# Patient Record
Sex: Male | Born: 1940
Health system: Southern US, Community
[De-identification: ages and names within clinical notes are randomized; demographics above are authoritative.]

## PROBLEM LIST (undated history)

## (undated) DIAGNOSIS — E039 Hypothyroidism, unspecified: Secondary | ICD-10-CM

## (undated) DIAGNOSIS — K219 Gastro-esophageal reflux disease without esophagitis: Secondary | ICD-10-CM

## (undated) DIAGNOSIS — E7800 Pure hypercholesterolemia, unspecified: Secondary | ICD-10-CM

## (undated) DIAGNOSIS — G9340 Encephalopathy, unspecified: Secondary | ICD-10-CM

## (undated) DIAGNOSIS — S329XXA Fracture of unspecified parts of lumbosacral spine and pelvis, initial encounter for closed fracture: Secondary | ICD-10-CM

## (undated) DIAGNOSIS — M1990 Unspecified osteoarthritis, unspecified site: Secondary | ICD-10-CM

## (undated) DIAGNOSIS — I10 Essential (primary) hypertension: Secondary | ICD-10-CM

## (undated) DIAGNOSIS — E119 Type 2 diabetes mellitus without complications: Secondary | ICD-10-CM

## (undated) DIAGNOSIS — Z9289 Personal history of other medical treatment: Secondary | ICD-10-CM

## (undated) DIAGNOSIS — M8580 Other specified disorders of bone density and structure, unspecified site: Secondary | ICD-10-CM

## (undated) DIAGNOSIS — Z77098 Contact with and (suspected) exposure to other hazardous, chiefly nonmedicinal, chemicals: Secondary | ICD-10-CM

## (undated) HISTORY — PX: CARPAL TUNNEL RELEASE: SHX101

## (undated) HISTORY — DX: Other specified disorders of bone density and structure, unspecified site: M85.80

## (undated) HISTORY — PX: EYE SURGERY: SHX253

---

## 1986-10-31 HISTORY — PX: INCISION AND DRAINAGE OF WOUND: SHX1803

## 1998-02-18 ENCOUNTER — Encounter: Admission: RE | Admit: 1998-02-18 | Discharge: 1998-02-18 | Payer: Self-pay | Admitting: Hematology and Oncology

## 1998-03-27 ENCOUNTER — Encounter: Admission: RE | Admit: 1998-03-27 | Discharge: 1998-03-27 | Payer: Self-pay | Admitting: Internal Medicine

## 1998-06-02 ENCOUNTER — Encounter: Admission: RE | Admit: 1998-06-02 | Discharge: 1998-06-02 | Payer: Self-pay | Admitting: Internal Medicine

## 1998-07-03 ENCOUNTER — Encounter: Admission: RE | Admit: 1998-07-03 | Discharge: 1998-07-03 | Payer: Self-pay | Admitting: Internal Medicine

## 1998-08-17 ENCOUNTER — Encounter: Admission: RE | Admit: 1998-08-17 | Discharge: 1998-08-17 | Payer: Self-pay | Admitting: Hematology and Oncology

## 1998-09-18 ENCOUNTER — Encounter: Admission: RE | Admit: 1998-09-18 | Discharge: 1998-09-18 | Payer: Self-pay | Admitting: Internal Medicine

## 1998-12-16 ENCOUNTER — Encounter: Admission: RE | Admit: 1998-12-16 | Discharge: 1998-12-16 | Payer: Self-pay | Admitting: Hematology and Oncology

## 1999-03-15 ENCOUNTER — Encounter: Admission: RE | Admit: 1999-03-15 | Discharge: 1999-03-15 | Payer: Self-pay | Admitting: Internal Medicine

## 1999-05-22 ENCOUNTER — Inpatient Hospital Stay (HOSPITAL_COMMUNITY): Admission: EM | Admit: 1999-05-22 | Discharge: 1999-05-23 | Payer: Self-pay | Admitting: Emergency Medicine

## 1999-05-22 ENCOUNTER — Encounter: Payer: Self-pay | Admitting: Emergency Medicine

## 1999-05-23 ENCOUNTER — Encounter: Payer: Self-pay | Admitting: Internal Medicine

## 1999-06-16 ENCOUNTER — Encounter: Admission: RE | Admit: 1999-06-16 | Discharge: 1999-06-16 | Payer: Self-pay | Admitting: Internal Medicine

## 1999-07-26 ENCOUNTER — Encounter: Admission: RE | Admit: 1999-07-26 | Discharge: 1999-07-26 | Payer: Self-pay | Admitting: Hematology and Oncology

## 1999-09-07 ENCOUNTER — Encounter: Admission: RE | Admit: 1999-09-07 | Discharge: 1999-09-07 | Payer: Self-pay | Admitting: Hematology and Oncology

## 1999-10-13 ENCOUNTER — Encounter: Admission: RE | Admit: 1999-10-13 | Discharge: 1999-10-13 | Payer: Self-pay | Admitting: Internal Medicine

## 1999-11-01 ENCOUNTER — Encounter: Payer: Self-pay | Admitting: Internal Medicine

## 1999-11-01 ENCOUNTER — Emergency Department (HOSPITAL_COMMUNITY): Admission: EM | Admit: 1999-11-01 | Discharge: 1999-11-01 | Payer: Self-pay | Admitting: Internal Medicine

## 1999-12-01 ENCOUNTER — Encounter: Admission: RE | Admit: 1999-12-01 | Discharge: 1999-12-01 | Payer: Self-pay | Admitting: Internal Medicine

## 2000-04-05 ENCOUNTER — Encounter: Admission: RE | Admit: 2000-04-05 | Discharge: 2000-04-05 | Payer: Self-pay | Admitting: Internal Medicine

## 2000-04-24 ENCOUNTER — Encounter: Admission: RE | Admit: 2000-04-24 | Discharge: 2000-04-24 | Payer: Self-pay | Admitting: Internal Medicine

## 2000-08-25 ENCOUNTER — Encounter: Admission: RE | Admit: 2000-08-25 | Discharge: 2000-08-25 | Payer: Self-pay | Admitting: Hematology and Oncology

## 2000-09-13 ENCOUNTER — Encounter: Admission: RE | Admit: 2000-09-13 | Discharge: 2000-09-13 | Payer: Self-pay | Admitting: Internal Medicine

## 2000-11-06 ENCOUNTER — Encounter: Admission: RE | Admit: 2000-11-06 | Discharge: 2000-11-06 | Payer: Self-pay | Admitting: Internal Medicine

## 2001-02-26 ENCOUNTER — Ambulatory Visit (HOSPITAL_COMMUNITY): Admission: RE | Admit: 2001-02-26 | Discharge: 2001-02-26 | Payer: Self-pay | Admitting: Gastroenterology

## 2001-05-24 ENCOUNTER — Encounter: Admission: RE | Admit: 2001-05-24 | Discharge: 2001-05-24 | Payer: Self-pay | Admitting: Internal Medicine

## 2001-08-30 ENCOUNTER — Encounter: Admission: RE | Admit: 2001-08-30 | Discharge: 2001-08-30 | Payer: Self-pay

## 2001-12-11 ENCOUNTER — Encounter: Admission: RE | Admit: 2001-12-11 | Discharge: 2001-12-11 | Payer: Self-pay | Admitting: Internal Medicine

## 2002-01-02 ENCOUNTER — Encounter: Admission: RE | Admit: 2002-01-02 | Discharge: 2002-01-02 | Payer: Self-pay | Admitting: Internal Medicine

## 2002-04-02 ENCOUNTER — Encounter: Admission: RE | Admit: 2002-04-02 | Discharge: 2002-04-02 | Payer: Self-pay | Admitting: Internal Medicine

## 2002-04-03 ENCOUNTER — Encounter: Admission: RE | Admit: 2002-04-03 | Discharge: 2002-04-03 | Payer: Self-pay | Admitting: Internal Medicine

## 2002-09-02 ENCOUNTER — Encounter: Admission: RE | Admit: 2002-09-02 | Discharge: 2002-09-02 | Payer: Self-pay | Admitting: Internal Medicine

## 2002-12-23 ENCOUNTER — Encounter: Admission: RE | Admit: 2002-12-23 | Discharge: 2002-12-23 | Payer: Self-pay | Admitting: Internal Medicine

## 2002-12-24 ENCOUNTER — Ambulatory Visit (HOSPITAL_COMMUNITY): Admission: RE | Admit: 2002-12-24 | Discharge: 2002-12-24 | Payer: Self-pay | Admitting: *Deleted

## 2002-12-24 ENCOUNTER — Encounter: Payer: Self-pay | Admitting: *Deleted

## 2002-12-30 ENCOUNTER — Encounter: Admission: RE | Admit: 2002-12-30 | Discharge: 2002-12-30 | Payer: Self-pay | Admitting: Internal Medicine

## 2002-12-31 ENCOUNTER — Encounter: Admission: RE | Admit: 2002-12-31 | Discharge: 2002-12-31 | Payer: Self-pay | Admitting: Internal Medicine

## 2003-03-25 ENCOUNTER — Encounter: Admission: RE | Admit: 2003-03-25 | Discharge: 2003-03-25 | Payer: Self-pay | Admitting: Internal Medicine

## 2003-04-15 ENCOUNTER — Encounter: Admission: RE | Admit: 2003-04-15 | Discharge: 2003-04-15 | Payer: Self-pay | Admitting: Internal Medicine

## 2003-07-23 ENCOUNTER — Encounter: Admission: RE | Admit: 2003-07-23 | Discharge: 2003-07-23 | Payer: Self-pay | Admitting: Internal Medicine

## 2003-10-16 ENCOUNTER — Encounter: Admission: RE | Admit: 2003-10-16 | Discharge: 2003-10-16 | Payer: Self-pay | Admitting: Internal Medicine

## 2004-01-14 ENCOUNTER — Encounter: Admission: RE | Admit: 2004-01-14 | Discharge: 2004-01-14 | Payer: Self-pay | Admitting: Internal Medicine

## 2004-03-31 ENCOUNTER — Encounter: Admission: RE | Admit: 2004-03-31 | Discharge: 2004-03-31 | Payer: Self-pay | Admitting: Internal Medicine

## 2004-04-07 ENCOUNTER — Encounter: Admission: RE | Admit: 2004-04-07 | Discharge: 2004-04-07 | Payer: Self-pay | Admitting: Internal Medicine

## 2004-06-07 ENCOUNTER — Encounter: Admission: RE | Admit: 2004-06-07 | Discharge: 2004-06-07 | Payer: Self-pay | Admitting: Internal Medicine

## 2004-07-13 ENCOUNTER — Ambulatory Visit: Payer: Self-pay | Admitting: Internal Medicine

## 2004-08-10 ENCOUNTER — Ambulatory Visit: Payer: Self-pay | Admitting: Internal Medicine

## 2004-09-27 ENCOUNTER — Ambulatory Visit: Payer: Self-pay | Admitting: Internal Medicine

## 2004-10-01 ENCOUNTER — Ambulatory Visit: Payer: Self-pay | Admitting: Internal Medicine

## 2004-11-09 ENCOUNTER — Ambulatory Visit: Payer: Self-pay | Admitting: Internal Medicine

## 2005-02-15 ENCOUNTER — Emergency Department (HOSPITAL_COMMUNITY): Admission: EM | Admit: 2005-02-15 | Discharge: 2005-02-15 | Payer: Self-pay | Admitting: Emergency Medicine

## 2005-02-22 ENCOUNTER — Ambulatory Visit: Payer: Self-pay | Admitting: Internal Medicine

## 2005-04-11 ENCOUNTER — Encounter (INDEPENDENT_AMBULATORY_CARE_PROVIDER_SITE_OTHER): Payer: Self-pay | Admitting: Internal Medicine

## 2005-04-11 ENCOUNTER — Ambulatory Visit: Payer: Self-pay | Admitting: Internal Medicine

## 2005-04-11 LAB — CONVERTED CEMR LAB: PSA: 2.32 ng/mL

## 2005-06-15 ENCOUNTER — Ambulatory Visit: Payer: Self-pay | Admitting: Internal Medicine

## 2005-06-17 ENCOUNTER — Ambulatory Visit (HOSPITAL_COMMUNITY): Admission: RE | Admit: 2005-06-17 | Discharge: 2005-06-17 | Payer: Self-pay | Admitting: Internal Medicine

## 2005-07-01 ENCOUNTER — Ambulatory Visit: Payer: Self-pay | Admitting: Internal Medicine

## 2005-07-20 ENCOUNTER — Ambulatory Visit: Payer: Self-pay | Admitting: Internal Medicine

## 2005-08-19 ENCOUNTER — Ambulatory Visit: Payer: Self-pay | Admitting: Internal Medicine

## 2005-09-28 ENCOUNTER — Ambulatory Visit: Payer: Self-pay | Admitting: Internal Medicine

## 2005-10-07 ENCOUNTER — Ambulatory Visit: Payer: Self-pay | Admitting: Internal Medicine

## 2005-11-15 ENCOUNTER — Ambulatory Visit: Payer: Self-pay | Admitting: Internal Medicine

## 2006-03-31 ENCOUNTER — Ambulatory Visit: Payer: Self-pay | Admitting: Internal Medicine

## 2006-07-20 ENCOUNTER — Ambulatory Visit: Payer: Self-pay | Admitting: Hospitalist

## 2006-07-20 ENCOUNTER — Encounter (INDEPENDENT_AMBULATORY_CARE_PROVIDER_SITE_OTHER): Payer: Self-pay | Admitting: Internal Medicine

## 2006-07-20 LAB — CONVERTED CEMR LAB: Hgb A1c MFr Bld: 6.2 %

## 2006-08-02 ENCOUNTER — Ambulatory Visit: Payer: Self-pay | Admitting: Internal Medicine

## 2006-08-17 ENCOUNTER — Encounter (INDEPENDENT_AMBULATORY_CARE_PROVIDER_SITE_OTHER): Payer: Self-pay | Admitting: Internal Medicine

## 2006-08-17 DIAGNOSIS — E785 Hyperlipidemia, unspecified: Secondary | ICD-10-CM | POA: Insufficient documentation

## 2006-08-17 DIAGNOSIS — E119 Type 2 diabetes mellitus without complications: Secondary | ICD-10-CM | POA: Insufficient documentation

## 2006-08-17 DIAGNOSIS — Z860100 Personal history of colon polyps, unspecified: Secondary | ICD-10-CM | POA: Insufficient documentation

## 2006-08-17 DIAGNOSIS — L02419 Cutaneous abscess of limb, unspecified: Secondary | ICD-10-CM | POA: Insufficient documentation

## 2006-08-17 DIAGNOSIS — I10 Essential (primary) hypertension: Secondary | ICD-10-CM | POA: Insufficient documentation

## 2006-08-17 DIAGNOSIS — E1169 Type 2 diabetes mellitus with other specified complication: Secondary | ICD-10-CM | POA: Insufficient documentation

## 2006-08-17 DIAGNOSIS — B351 Tinea unguium: Secondary | ICD-10-CM | POA: Insufficient documentation

## 2006-08-17 DIAGNOSIS — L03119 Cellulitis of unspecified part of limb: Secondary | ICD-10-CM

## 2006-08-17 DIAGNOSIS — K5730 Diverticulosis of large intestine without perforation or abscess without bleeding: Secondary | ICD-10-CM | POA: Insufficient documentation

## 2006-08-17 DIAGNOSIS — S82899A Other fracture of unspecified lower leg, initial encounter for closed fracture: Secondary | ICD-10-CM | POA: Insufficient documentation

## 2006-08-17 DIAGNOSIS — H269 Unspecified cataract: Secondary | ICD-10-CM | POA: Insufficient documentation

## 2006-08-17 DIAGNOSIS — I152 Hypertension secondary to endocrine disorders: Secondary | ICD-10-CM | POA: Insufficient documentation

## 2006-08-17 DIAGNOSIS — M1990 Unspecified osteoarthritis, unspecified site: Secondary | ICD-10-CM | POA: Insufficient documentation

## 2006-10-10 DIAGNOSIS — M2682 Posterior soft tissue impingement: Secondary | ICD-10-CM | POA: Insufficient documentation

## 2006-10-10 DIAGNOSIS — Z8739 Personal history of other diseases of the musculoskeletal system and connective tissue: Secondary | ICD-10-CM | POA: Insufficient documentation

## 2006-12-25 ENCOUNTER — Ambulatory Visit: Payer: Self-pay | Admitting: Internal Medicine

## 2006-12-25 ENCOUNTER — Encounter (INDEPENDENT_AMBULATORY_CARE_PROVIDER_SITE_OTHER): Payer: Self-pay | Admitting: Internal Medicine

## 2006-12-25 ENCOUNTER — Telehealth: Payer: Self-pay | Admitting: *Deleted

## 2006-12-25 LAB — CONVERTED CEMR LAB
Creatinine, Urine: 108.4 mg/dL
Hgb A1c MFr Bld: 7 %

## 2007-03-23 ENCOUNTER — Ambulatory Visit: Payer: Self-pay | Admitting: Hospitalist

## 2007-03-23 ENCOUNTER — Encounter (INDEPENDENT_AMBULATORY_CARE_PROVIDER_SITE_OTHER): Payer: Self-pay | Admitting: Internal Medicine

## 2007-03-23 LAB — CONVERTED CEMR LAB
Blood Glucose, Fingerstick: 397
Hgb A1c MFr Bld: 6.6 %

## 2007-03-27 ENCOUNTER — Ambulatory Visit (HOSPITAL_COMMUNITY): Admission: RE | Admit: 2007-03-27 | Discharge: 2007-03-27 | Payer: Self-pay | Admitting: Internal Medicine

## 2007-03-30 ENCOUNTER — Ambulatory Visit: Payer: Self-pay | Admitting: Internal Medicine

## 2007-03-30 LAB — CONVERTED CEMR LAB: Insulin/Carbohydrate Ratio: 1

## 2007-04-02 ENCOUNTER — Encounter (INDEPENDENT_AMBULATORY_CARE_PROVIDER_SITE_OTHER): Payer: Self-pay | Admitting: *Deleted

## 2007-04-10 ENCOUNTER — Telehealth: Payer: Self-pay | Admitting: *Deleted

## 2007-05-15 ENCOUNTER — Telehealth (INDEPENDENT_AMBULATORY_CARE_PROVIDER_SITE_OTHER): Payer: Self-pay | Admitting: *Deleted

## 2007-05-30 ENCOUNTER — Telehealth (INDEPENDENT_AMBULATORY_CARE_PROVIDER_SITE_OTHER): Payer: Self-pay | Admitting: *Deleted

## 2007-12-03 ENCOUNTER — Other Ambulatory Visit: Payer: Self-pay

## 2007-12-03 ENCOUNTER — Ambulatory Visit: Payer: Self-pay

## 2008-06-03 ENCOUNTER — Inpatient Hospital Stay: Payer: Self-pay | Admitting: Internal Medicine

## 2008-07-31 ENCOUNTER — Ambulatory Visit (HOSPITAL_COMMUNITY): Admission: RE | Admit: 2008-07-31 | Discharge: 2008-07-31 | Payer: Self-pay | Admitting: Internal Medicine

## 2008-12-21 ENCOUNTER — Ambulatory Visit: Payer: Self-pay | Admitting: Internal Medicine

## 2008-12-21 ENCOUNTER — Inpatient Hospital Stay (HOSPITAL_COMMUNITY): Admission: EM | Admit: 2008-12-21 | Discharge: 2008-12-25 | Payer: Self-pay | Admitting: Emergency Medicine

## 2010-01-17 ENCOUNTER — Emergency Department (HOSPITAL_COMMUNITY): Admission: EM | Admit: 2010-01-17 | Discharge: 2010-01-17 | Payer: Self-pay | Admitting: Emergency Medicine

## 2011-01-23 LAB — DIFFERENTIAL
Eosinophils Relative: 4 % (ref 0–5)
Monocytes Absolute: 1 10*3/uL (ref 0.1–1.0)
Neutro Abs: 5.1 10*3/uL (ref 1.7–7.7)
Neutrophils Relative %: 54 % (ref 43–77)

## 2011-01-23 LAB — GLUCOSE, CAPILLARY
Glucose-Capillary: 207 mg/dL — ABNORMAL HIGH (ref 70–99)
Glucose-Capillary: 256 mg/dL — ABNORMAL HIGH (ref 70–99)

## 2011-01-23 LAB — POCT I-STAT, CHEM 8
Chloride: 106 mEq/L (ref 96–112)
HCT: 39 % (ref 39.0–52.0)
Potassium: 4.3 mEq/L (ref 3.5–5.1)

## 2011-01-23 LAB — CBC
HCT: 39.7 % (ref 39.0–52.0)
MCHC: 33.9 g/dL (ref 30.0–36.0)
WBC: 9.4 10*3/uL (ref 4.0–10.5)

## 2011-02-15 LAB — CBC
HCT: 35.2 % — ABNORMAL LOW (ref 39.0–52.0)
HCT: 39 % (ref 39.0–52.0)
HCT: 39 % (ref 39.0–52.0)
HCT: 40.5 % (ref 39.0–52.0)
Hemoglobin: 12.4 g/dL — ABNORMAL LOW (ref 13.0–17.0)
Hemoglobin: 13.5 g/dL (ref 13.0–17.0)
Hemoglobin: 13.5 g/dL (ref 13.0–17.0)
Hemoglobin: 13.7 g/dL (ref 13.0–17.0)
MCHC: 34.5 g/dL (ref 30.0–36.0)
MCHC: 34.6 g/dL (ref 30.0–36.0)
MCHC: 34.6 g/dL (ref 30.0–36.0)
MCHC: 34.7 g/dL (ref 30.0–36.0)
MCV: 87.6 fL (ref 78.0–100.0)
MCV: 88.4 fL (ref 78.0–100.0)
MCV: 89.2 fL (ref 78.0–100.0)
RBC: 4.45 MIL/uL (ref 4.22–5.81)
RBC: 4.48 MIL/uL (ref 4.22–5.81)
RDW: 13.9 % (ref 11.5–15.5)
RDW: 14.2 % (ref 11.5–15.5)
WBC: 7.4 10*3/uL (ref 4.0–10.5)
WBC: 8.3 10*3/uL (ref 4.0–10.5)

## 2011-02-15 LAB — BASIC METABOLIC PANEL
BUN: 40 mg/dL — ABNORMAL HIGH (ref 6–23)
CO2: 25 mEq/L (ref 19–32)
CO2: 27 mEq/L (ref 19–32)
Calcium: 8.6 mg/dL (ref 8.4–10.5)
Calcium: 9 mg/dL (ref 8.4–10.5)
Calcium: 9.2 mg/dL (ref 8.4–10.5)
Chloride: 103 mEq/L (ref 96–112)
Chloride: 103 mEq/L (ref 96–112)
Chloride: 109 mEq/L (ref 96–112)
Chloride: 99 mEq/L (ref 96–112)
Creatinine, Ser: 0.96 mg/dL (ref 0.4–1.5)
Creatinine, Ser: 1.02 mg/dL (ref 0.4–1.5)
Creatinine, Ser: 1.71 mg/dL — ABNORMAL HIGH (ref 0.4–1.5)
GFR calc Af Amer: 48 mL/min — ABNORMAL LOW (ref >60)
GFR calc Af Amer: >60 mL/min (ref >60)
GFR calc Af Amer: >60 mL/min (ref >60)
GFR calc non Af Amer: 40 mL/min — ABNORMAL LOW (ref >60)
GFR calc non Af Amer: >60 mL/min (ref >60)
GFR calc non Af Amer: >60 mL/min (ref >60)
Glucose, Bld: 153 mg/dL — ABNORMAL HIGH (ref 70–99)
Glucose, Bld: 175 mg/dL — ABNORMAL HIGH (ref 70–99)
Glucose, Bld: 356 mg/dL — ABNORMAL HIGH (ref 70–99)
Potassium: 3.8 mEq/L (ref 3.5–5.1)
Potassium: 4 mEq/L (ref 3.5–5.1)
Potassium: 4.4 mEq/L (ref 3.5–5.1)
Sodium: 135 mEq/L (ref 135–145)
Sodium: 141 mEq/L (ref 135–145)
Sodium: 142 mEq/L (ref 135–145)

## 2011-02-15 LAB — GLUCOSE, CAPILLARY
Glucose-Capillary: 168 mg/dL — ABNORMAL HIGH (ref 70–99)
Glucose-Capillary: 181 mg/dL — ABNORMAL HIGH (ref 70–99)
Glucose-Capillary: 240 mg/dL — ABNORMAL HIGH (ref 70–99)
Glucose-Capillary: 302 mg/dL — ABNORMAL HIGH (ref 70–99)
Glucose-Capillary: 309 mg/dL — ABNORMAL HIGH (ref 70–99)
Glucose-Capillary: 343 mg/dL — ABNORMAL HIGH (ref 70–99)
Glucose-Capillary: 347 mg/dL — ABNORMAL HIGH (ref 70–99)
Glucose-Capillary: 406 mg/dL — ABNORMAL HIGH (ref 70–99)
Glucose-Capillary: 421 mg/dL — ABNORMAL HIGH (ref 70–99)
Glucose-Capillary: 509 mg/dL (ref 70–99)
Glucose-Capillary: 64 mg/dL — ABNORMAL LOW (ref 70–99)

## 2011-02-15 LAB — COMPREHENSIVE METABOLIC PANEL
ALT: 29 U/L (ref 0–53)
Alkaline Phosphatase: 100 U/L (ref 39–117)
BUN: 16 mg/dL (ref 6–23)
CO2: 27 mEq/L (ref 19–32)
Calcium: 9.9 mg/dL (ref 8.4–10.5)
GFR calc Af Amer: >60 mL/min (ref >60)
Glucose, Bld: 54 mg/dL — ABNORMAL LOW (ref 70–99)
Potassium: 3.5 mEq/L (ref 3.5–5.1)
Sodium: 143 mEq/L (ref 135–145)
Total Bilirubin: 0.6 mg/dL (ref 0.3–1.2)
Total Protein: 6.1 g/dL (ref 6.0–8.3)

## 2011-02-15 LAB — HEMOGLOBIN A1C
Hgb A1c MFr Bld: 6.5 % — ABNORMAL HIGH (ref 4.6–6.1)
Mean Plasma Glucose: 140 mg/dL

## 2011-02-15 LAB — URINALYSIS, ROUTINE W REFLEX MICROSCOPIC
Glucose, UA: 500 mg/dL — AB
Hgb urine dipstick: NEGATIVE
Nitrite: NEGATIVE
Protein, ur: NEGATIVE mg/dL
Specific Gravity, Urine: 1.025 (ref 1.005–1.030)
Urobilinogen, UA: 0.2 mg/dL (ref 0.0–1.0)

## 2011-02-15 LAB — DIFFERENTIAL
Basophils Relative: 0 % (ref 0–1)
Eosinophils Relative: 0 % (ref 0–5)
Lymphocytes Relative: 37 % (ref 12–46)
Monocytes Absolute: 0.9 10*3/uL (ref 0.1–1.0)
Monocytes Relative: 11 % (ref 3–12)
Neutrophils Relative %: 52 % (ref 43–77)

## 2011-02-15 LAB — GLUCOSE, RANDOM: Glucose, Bld: 452 mg/dL — ABNORMAL HIGH (ref 70–99)

## 2011-02-15 LAB — RAPID URINE DRUG SCREEN, HOSP PERFORMED
Amphetamines: NOT DETECTED
Cocaine: NOT DETECTED
Opiates: NOT DETECTED

## 2011-02-15 LAB — T4, FREE: Free T4: 2.84 ng/dL — ABNORMAL HIGH (ref 0.89–1.80)

## 2011-02-15 LAB — TSH: TSH: 0.016 u[IU]/mL — ABNORMAL LOW (ref 0.350–4.500)

## 2011-02-15 LAB — T3, FREE: T3, Free: 8.2 pg/mL — ABNORMAL HIGH (ref 2.3–4.2)

## 2011-03-15 NOTE — Procedures (Signed)
REFERRING PHYSICIAN:  Dr. Welton Flakes.   CLINICAL HISTORY:  A 70 year old patient being evaluated for an episode  of possible seizures.   MEDICATION LISTED:  Insulin, aspirin, OxyContin, Lipitor,  hydrochlorothiazide, and Celebrex.   This is a 17-channel routine EEG recorded with the patient awake and  drowsy using standard 10/20 electrode placement.   Background awake rhythm consists of 8-9 Hz alpha, which is of diminished  amplitude, synchronous, reactive to eye-opening and closure.  No  paroxysmal epileptiform activities, spikes or sharp waves are noted.  Definite sleep stages are not noted except mild drowsiness, which is  uneventful.  Hyperventilation and photic stimulation are both  unremarkable.  EKG tracing reveals regular sinus rhythm.  Length of the  recording is 21.6 minutes.  Technical component is average.   IMPRESSION:  This EEG performed during awake and drowsy states is within  normal limits.  No definite epileptiform features were noted.           ______________________________  Sunny Schlein. Pearlean Brownie, MD     AOZ:[id number]  D:  07/31/2008 17:52:10  T:  08/01/2008 00:33:31  Job #:  [id number]   cc:   Dr. Welton Flakes.

## 2011-03-15 NOTE — Consult Note (Signed)
NAMEORLANDIS, [name] NO.:  [id number]   MEDICAL RECORD NO.:  [id number]          PATIENT TYPE:  INP   LOCATION:  2025                         FACILITY:  [facility]   PHYSICIAN:  Antonietta Breach, M.D.  DATE OF BIRTH:  05/07/1941   DATE OF CONSULTATION:  12/22/2008  DATE OF DISCHARGE:  12/25/2008                                 CONSULTATION   REASON FOR CONSULTATION:  Psychosis, agitation.   REQUESTING PHYSICIAN:  Ulyess Mort, M.D.   HISTORY OF PRESENT ILLNESS:  [name] is a 70 year old male  admitted to the [facility] on December 21, 2008 due to  psychosis and hypoglycemia.   [name] did require involuntary commitment and was given Geodon in  the emergency room for severe agitation and combativeness.  Since that  time, he has continued to have waxing and waning orientation.  He has  severe short-term memory impairment.  He also has shown impaired  judgment in managing his insulin.   One of the precipitating factors in his acute mental status changes  appears to be his mismanagement of insulin.   PAST PSYCHIATRIC HISTORY:  In review of the past medical record, there  was a similar episode in August 2009.   [name] was functional until approximately 1 year ago when he began  to deteriorate in his functioning.   FAMILY PSYCHIATRIC HISTORY:  None known.   SOCIAL HISTORY:  [name] wife lives with him.  Occupation:  Retired.  He does not use any alcohol or illegal drugs.  His religion is  [place].   PAST MEDICAL HISTORY:  Insulin-dependent diabetes mellitus with episodes  of hypoglycemia.   MEDICATIONS:  The MAR is reviewed.  He is on Haldol 2 mg IV q.4 h p.r.n.   ALLERGIES:  has an allergy to PENICILLIN.   LABORATORY DATA:  WBC 7.3, hemoglobin 13.5, platelet count 188.  Sodium  143, BUN 16, creatinine 0.93, glucose 54, SGOT 27, SGPT 29.   Alcohol and urine drug screen negative.   Head CT without contrast was  unremarkable.   REVIEW OF SYSTEMS:  [name] is only able to provide part of this.  The staff, his wife, medical record and electronic medical record  provide information.   Constitutional, head, eyes, ears, nose and throat, mouth, neurologic:  Unremarkable.  Psychiatric:  [name] did have an episode of  depression in 80.  He was treated with Paxil at that time effectively  with no recurrence.   During his mental status abnormalities this past week, [name] has  become paranoid.  He also did have an episode of mild confusion this  morning which has cleared.  There has been no combativeness with his  wife.   The the time of the undersigned's visit, [name] still has memory  impairment.  However, he is cooperative and noncombative.   Cardiovascular, respiratory, gastrointestinal, genitourinary, skin,  musculoskeletal, hematologic lymphatic, endocrine, metabolic all  unremarkable.   EXAMINATION:  VITAL SIGNS:  Temperature 98.5, pulse 84, respiratory rate  22, blood pressure 107/54, O2 saturation on room air 94%.  GENERAL APPEARANCE:  [name] is an elderly male sitting up in his  hospital chair with no abnormal involuntary movements.   MENTAL STATUS EXAM:  [name] is alert.  His eye contact is intact.  His attention span is mildly decreased, concentration mildly decreased.  His affect is slightly anxious, mood slightly anxious.  He does have  intact orientation to all spheres.  Memory:  3/3 words immediate 0/3  words on recall.  However, 3/3 visual objects immediate and 2/3 visual  objects on 3-minute recall.   Fund of knowledge and intelligence are within normal limits.  Speech  involves normal rate and prosody without dysarthria.  Thought process is  logical, coherent, goal-directed.  No looseness of associations.  Thought content:  No thoughts of harming himself or others. No delusions  or hallucinations.  His insight is intact and that he knows he has  been  having mental status problems involving his glucose. His judgment is  intact.   ASSESSMENT:  AXIS I:  293.00 delirium not otherwise specified.  Rule out  293.84 anxiety disorder not otherwise specified.  293.83 mood disorder not otherwise specified, depressed, now stable.  He  may have had an episode of major depression in the past in the early  1990s.  However, there has been no recurrence.  AXIS II:  Deferred.  AXIS III:  See past medical history.  AXIS IV:  General medical.  AXIS V:  55.   [name] is now in a stable mental state.  However, by definition  delirium can wax and wane.   The undersigned recommends that [name] reside in a 24-hour per day  supervision facility given his age and his apparent low delirium  threshold; however, he does decline and he does have the capacity for  informed consent:  He can make a consistent choice.  He can  differentiate between his options and their respective risks versus  benefits.  He can appreciate his risk of morbidity and mortality and he  can reason well.   RECOMMENDATIONS:  1. Would check an EKG for a QTC; if less than 450 milliseconds and his      psychosis and agitation return, would start Abilify 5 mg p.o. or IM      daily at 1800.  2. If compulsions over taking his CBGs continue, would send him to      outpatient psychiatric treatment.  3. Disposition Planning:  If his delirium remains resolved, he will be      cleared for outpatient discharge.  4. In the meantime, for any recurrence of severe agitation or      combativeness, would utilize Ativan one half to 2 mg p.o. IM or IV      q.4 h p.r.n. with caution about sedation, ataxia and falls.  5. Would keep memory and orientation cues in the room.      Antonietta Breach, M.D.  Electronically Signed     JW/MEDQ  D:  12/28/2008  T:  12/28/2008  Job:  [id number]   cc:   C. Ulyess Mort, M.D.

## 2011-03-18 NOTE — Procedures (Signed)
[facility]  Patient:    [name], [name]                      MRN: [id number] Proc. Date: 02/26/01 Adm. Date:  [id number] Attending:  Louie Bun CC:         [facility]   Procedure Report  PROCEDURE:  Colonoscopy.  INDICATIONS FOR PROCEDURE:  History of adenomatous colon polyps with last colonoscopy five years ago.  DESCRIPTION OF PROCEDURE:  The patient was placed in the left lateral decubitus position then placed on the pulse monitor with continuous low flow oxygen delivered by nasal cannula. He was sedated with 80 mg IV Demerol and 8 IV Versed. The Olympus video colonoscope was inserted into the rectum and advanced to the cecum, confirmed by transillumination of McBurneys point and visualization of the ileocecal valve and appendiceal orifice. The prep was suboptimal in the cecum and proximal ascending colon and I could not rule out small lesions less than 1 cm in all areas. Otherwise the cecum, ascending, transverse, descending, sigmoid and rectum all appeared normal with no masses, polyps, diverticula or other mucosal abnormalities. There were small internal hemorrhoids seen on retroflexed view. The colonoscope was then withdrawn and the patient returned to the recovery room in stable condition. The patient tolerated the procedure well and there were no immediate complications.  IMPRESSION:  Internal hemorrhoids otherwise normal colonoscopy.  PLAN:  Repeat colonoscopy in five years. DD:  02/26/01 TD:  02/26/01 Job: [id number]

## 2011-03-18 NOTE — Discharge Summary (Signed)
[name], [name]               ACCOUNT NO.:  [id number]   MEDICAL RECORD NO.:  [id number]          PATIENT TYPE:  INP   LOCATION:  2025                         FACILITY:  [facility]   PHYSICIAN:  C. Ulyess Mort, M.D.DATE OF BIRTH:  05-01-1941   DATE OF ADMISSION:  12/21/2008  DATE OF DISCHARGE:  12/25/2008                               DISCHARGE SUMMARY   DISCHARGE DIAGNOSES:  1. Graves disease, newly diagnosed.  2. Type 2 diabetes.  3. Altered mental status secondary to hypoglycemic episode, resolved.  4. Hypertension.  5. Hyperlipidemia.   DISCHARGE MEDICATIONS:  1. Lipitor 40 mg p.o. daily.  2. Hydrochlorothiazide 25 mg p.o. daily.  3. Aspirin 81 mg p.o. daily.  4. Celebrex 100 mg p.o. daily.  5. OxyContin 10 mg p.o. daily.  6. Benazepril 40 mg p.o. daily.  7. Vitamin B6 100 mg p.o. once daily.  8. NPH insulin inject 60 units each morning and 20 units each evening.  9. Humulin R inject 20 units in the morning with breakfast and 15      units in the evening.   DISPOSITION/FOLLOWUP:  The patient was being discharged from the [facility] on December 25, 2008.  The patient is to follow up  with his primary care physician, Dr. Beverely Risen in [place], [place], on December 29, 2008, at 9 o'clock in the morning.  At that time,  Dr. Welton Flakes is to set up treatment for the patient's newly diagnosed Graves  disease.  The patient's radioactive iodine uptake test results were  pending at the time of discharge.  The primary team contacted Dr. Welton Flakes  and informed her of the results of the newly diagnosed Graves disease.  She will need to initiate therapy at the time of followup.   PROCEDURE PERFORMED:  1. Head CT without contrast on December 21, 2008, shows no acute      intracranial abnormalities.  2. MRI of the brain without contrast on December 22, 2008, shows      cortical atrophy and mild chronic vascular ischemia.  No acute      abnormalities.  3. Thyroid  imaging and radioactive iodine uptake test on December 25, 2008, shows mildly elevated 24-hour radioactive uptake by the      thyroid gland, uptake is measured at 40%.  Findings are compatible      with diffuse toxic goiter and Graves disease.   CONSULTATIONS:  None.   BRIEF ADMITTING HISTORY AND PHYSICAL:  The patient is a 70 year old  Caucasian male with history of type 2 diabetes, hypertension, and  chronic progressive confusion who presents with altered mental status.  The patient has been becoming more demented over the past year per his  wife and over that time, he has started to become more combative and  fixated on keeping tight control of his diabetes but has had too-many-to-  count hypoglycemic episodes and for which his wife has had to give him  glucagon.  Per his wife he is fixated on keeping his CBGs below 120  being very aggressive with  the sliding scale insulins because before he  started having this decline in cognitive function.  The patient's wife  also states that he has become more combative than normal in recent  months.   PHYSICAL EXAMINATION:  VITAL SIGNS:  Temperature 98.5, blood pressure  147/81, pulse 97, respirations 18, and saturating 99% on room air.  GENERAL:  The patient is in no acute distress.  EYES:  Pupils are equal, round, and reactive to light and accommodation.  RESPIRATIONS:  Clear to auscultation bilaterally.  No wheezes  appreciated.  CARDIOVASCULAR:  Regular rate and rhythm.  No murmurs, rubs, or gallops.  GI:  Soft abdomen, nontender, and nondistended.  NEUROLOGIC:  Alert and oriented x2.  The patient is not oriented to  time.  The patient is having severe word finding difficulties and also  having very tangential thoughts.  His motor and sensory are grossly  intact.  The patient is very anxious and combative and screaming out at  hospital staff including both nurses and doctors and using foul  language.   LABORATORY DATA:  Sodium  143, potassium 3.5, chloride 108, bicarb 27,  BUN 16, creatinine 0.93, glucose 54.  White count 8.8, hemoglobin 14.1,  platelets 216.  Alcohol level less than 5.  Urine drug screen is  negative.  UA is negative for nitrites, negative for leukocytes.   HOSPITAL COURSE:  1. Altered mental status.  The patient was admitted to the [facility]      teaching service for evaluation of his altered mental status.  He      had to be admitted involuntarily for the first 24 hours because the      patient was adamant about not coming in.  However, both the      hospital staff including emergency medicine physician and the      internal medicine physician felt that the patient was endangering      to himself and need to be come in to evaluate for his newly      diagnosed altered mental status.  The patient's initial CT scan did      not show any evidence of trauma or head bleed.  His insulin regimen      was held on the first night of hospitalization and in that he had a      CBG of 54 and we were unsure of how much insulin he took prior to      coming into the hospital.  The patient also had an MRI the next day      as part of his  scan  workup to see if there had been any evidence      of new stroke and the MRI did not reveal any new evidence of      ischemia.  Also as part of his workup, the patient had a TSH level,      which was drawn which was found to be abnormal at a level of 0.016.      This will be discussed in further in problem #2.  Initially in the      emergency department, the patient received Geodon in order to calm      his combativeness.  The patient rested comfortably overnight and      had to receive two doses of Haldol throughout his first night of      admission.  The patient's mental status had cleared on second day      of hospitalization.  A mini-mental status exam was administered on      the second day of hospitalization.  The patient scored 29 out of      30.  He also stated  on second of hospitalization that he had felt      somewhat confused prior to coming to the hospital; however, his      wife related that he had returned to his normal baseline neurologic      function.  The patient's altered mental status was believed to be      due to secondary to his acute hypoglycemic episode.  The patient      was in stable and improved condition as regards his mental status      at the time of followup.  2. Newly diagnosed Graves disease.  As part of the patient's initial      workup for altered mental status, he received a TSH test, which      showed level of his thyroid-stimulating hormone to be 0.016.      Subsequently, a free T4 was drawn, which was elevated at 2.84 and a      free T3 was withdrawn, which was elevated at 8.2.  As part of this      workup, the patient had a radioactive iodine uptake test done on      December 25, 2008, which showed mildly elevated approximately 40%      24-hour radioactive iodine uptake by the thyroid gland, which is      compatible with diffuse toxic goiter, Graves disease.  The results      of this test were pending at the time of discharge; however, the      primary team was in contact with the patient's primary care      physician, Dr. Welton Flakes, about initiating therapy for his newly      diagnosed Graves disease at the time of followup.  3. Type 2 diabetes.  As mentioned, the patient was presented with a      hypoglycemic event.  In that, his primary team held his insulin      throughout his first night of hospitalization.  The patient was      started on a reduced dose of his home regimen of NPH.  His blood      sugars began to become elevated throughout his second and third      days of hospitalization.  The patient was subsequently put back on      his NPH home dose of 60 units in the morning, 20 units in the      evening.  He was maintained on a sliding scale regimen, moderate      intensive throughout his hospitalization.   He will resume his home      insulin regimen upon being discharged.  He has had good control of      hemoglobin A1c drawn in the hospital, level found to be 6.5%.  We      will defer to his primary care physician, Dr. Welton Flakes, on any      alterations that need to be made to avoid any future hypoglycemic      episodes.  4. Hypertension.  The patient was maintained on his home dose of      hydrochlorothiazide and ACE inhibitor.  His blood pressures were      well controlled throughout his hospitalization.   DISCHARGE VITAL SIGNS:  Temperature 97.8, blood pressure 113/59, pulse  84, respiration 20, and the patient was saturating 95% on room air.   DISCHARGE LABORATORY DATA:  Sodium 135, potassium 3.8, chloride 103,  bicarb 26, BUN 36, creatinine 1.17, glucose 175.  White blood cell count  8.3, hemoglobin 12.4, and platelets 172.      Genia Del, MD  Electronically Signed      C. Ulyess Mort, M.D.  Electronically Signed    ZF/MEDQ  D:  12/26/2008  T:  12/27/2008  Job:  [id number]   cc:   Beverely Risen, MD

## 2013-07-18 ENCOUNTER — Emergency Department: Payer: Self-pay | Admitting: Emergency Medicine

## 2013-07-28 ENCOUNTER — Emergency Department: Payer: Self-pay | Admitting: Emergency Medicine

## 2013-08-14 ENCOUNTER — Emergency Department (HOSPITAL_COMMUNITY): Payer: Medicare Other

## 2013-08-14 ENCOUNTER — Inpatient Hospital Stay (HOSPITAL_COMMUNITY)
Admission: EM | Admit: 2013-08-14 | Discharge: 2013-08-20 | DRG: 071 | Disposition: A | Discharge status: Home / Self Care | Payer: Medicare Other | Attending: Internal Medicine | Admitting: Internal Medicine

## 2013-08-14 ENCOUNTER — Encounter (HOSPITAL_COMMUNITY): Payer: Self-pay | Admitting: Emergency Medicine

## 2013-08-14 DIAGNOSIS — R4701 Aphasia: Secondary | ICD-10-CM | POA: Diagnosis present

## 2013-08-14 DIAGNOSIS — I517 Cardiomegaly: Secondary | ICD-10-CM | POA: Diagnosis present

## 2013-08-14 DIAGNOSIS — I152 Hypertension secondary to endocrine disorders: Secondary | ICD-10-CM | POA: Diagnosis present

## 2013-08-14 DIAGNOSIS — D72829 Elevated white blood cell count, unspecified: Secondary | ICD-10-CM | POA: Diagnosis present

## 2013-08-14 DIAGNOSIS — N179 Acute kidney failure, unspecified: Secondary | ICD-10-CM | POA: Diagnosis present

## 2013-08-14 DIAGNOSIS — E119 Type 2 diabetes mellitus without complications: Secondary | ICD-10-CM | POA: Diagnosis present

## 2013-08-14 DIAGNOSIS — G9340 Encephalopathy, unspecified: Principal | ICD-10-CM

## 2013-08-14 DIAGNOSIS — E0500 Thyrotoxicosis with diffuse goiter without thyrotoxic crisis or storm: Secondary | ICD-10-CM | POA: Diagnosis present

## 2013-08-14 DIAGNOSIS — E039 Hypothyroidism, unspecified: Secondary | ICD-10-CM | POA: Diagnosis present

## 2013-08-14 DIAGNOSIS — R7881 Bacteremia: Secondary | ICD-10-CM | POA: Diagnosis present

## 2013-08-14 DIAGNOSIS — R4182 Altered mental status, unspecified: Secondary | ICD-10-CM

## 2013-08-14 DIAGNOSIS — I10 Essential (primary) hypertension: Secondary | ICD-10-CM | POA: Diagnosis present

## 2013-08-14 DIAGNOSIS — E1169 Type 2 diabetes mellitus with other specified complication: Secondary | ICD-10-CM | POA: Diagnosis present

## 2013-08-14 DIAGNOSIS — E785 Hyperlipidemia, unspecified: Secondary | ICD-10-CM | POA: Diagnosis present

## 2013-08-14 HISTORY — DX: Unspecified osteoarthritis, unspecified site: M19.90

## 2013-08-14 HISTORY — DX: Encephalopathy, unspecified: G93.40

## 2013-08-14 HISTORY — DX: Essential (primary) hypertension: I10

## 2013-08-14 LAB — CBC WITH DIFFERENTIAL/PLATELET
Basophils Absolute: 0 10*3/uL (ref 0.0–0.1)
Eosinophils Relative: 0 % (ref 0–5)
Hemoglobin: 13.4 g/dL (ref 13.0–17.0)
Lymphocytes Relative: 34 % (ref 12–46)
Lymphs Abs: 4.7 10*3/uL — ABNORMAL HIGH (ref 0.7–4.0)
MCV: 87.2 fL (ref 78.0–100.0)
Monocytes Absolute: 1 10*3/uL (ref 0.1–1.0)
Monocytes Relative: 7 % (ref 3–12)
Neutro Abs: 8.3 10*3/uL — ABNORMAL HIGH (ref 1.7–7.7)
Neutrophils Relative %: 59 % (ref 43–77)
Platelets: 228 10*3/uL (ref 150–400)
RBC: 4.29 MIL/uL (ref 4.22–5.81)
RDW: 14.3 % (ref 11.5–15.5)
WBC: 14 10*3/uL — ABNORMAL HIGH (ref 4.0–10.5)

## 2013-08-14 LAB — URINALYSIS, ROUTINE W REFLEX MICROSCOPIC
Bilirubin Urine: NEGATIVE
Glucose, UA: >1000 mg/dL — AB
Hgb urine dipstick: NEGATIVE
Protein, ur: NEGATIVE mg/dL
Urobilinogen, UA: 1 mg/dL (ref 0.0–1.0)

## 2013-08-14 LAB — COMPREHENSIVE METABOLIC PANEL
ALT: 26 U/L (ref 0–53)
AST: 27 U/L (ref 0–37)
Alkaline Phosphatase: 90 U/L (ref 39–117)
CO2: 22 mEq/L (ref 19–32)
Calcium: 9.1 mg/dL (ref 8.4–10.5)
Chloride: 103 mEq/L (ref 96–112)
GFR calc Af Amer: >90 mL/min (ref >90)
GFR calc non Af Amer: 81 mL/min — ABNORMAL LOW (ref >90)
Glucose, Bld: 282 mg/dL — ABNORMAL HIGH (ref 70–99)
Potassium: 4.5 mEq/L (ref 3.5–5.1)
Sodium: 140 mEq/L (ref 135–145)
Total Bilirubin: 0.8 mg/dL (ref 0.3–1.2)
Total Protein: 6.4 g/dL (ref 6.0–8.3)

## 2013-08-14 LAB — HEMOGLOBIN A1C
Hgb A1c MFr Bld: 6.8 % — ABNORMAL HIGH (ref <5.7)
Mean Plasma Glucose: 148 mg/dL — ABNORMAL HIGH (ref <117)

## 2013-08-14 LAB — TSH: TSH: 0.696 u[IU]/mL (ref 0.350–4.500)

## 2013-08-14 LAB — URINE MICROSCOPIC-ADD ON

## 2013-08-14 LAB — RAPID URINE DRUG SCREEN, HOSP PERFORMED
Amphetamines: NOT DETECTED
Cocaine: NOT DETECTED
Opiates: NOT DETECTED

## 2013-08-14 LAB — ETHANOL: Alcohol, Ethyl (B): <11 mg/dL (ref 0–11)

## 2013-08-14 LAB — MRSA PCR SCREENING: MRSA by PCR: POSITIVE — AB

## 2013-08-14 LAB — AMMONIA: Ammonia: 23 umol/L (ref 11–60)

## 2013-08-14 LAB — GLUCOSE, CAPILLARY
Glucose-Capillary: 269 mg/dL — ABNORMAL HIGH (ref 70–99)
Glucose-Capillary: 257 mg/dL — ABNORMAL HIGH (ref 70–99)

## 2013-08-14 LAB — PROTIME-INR
INR: 1.01 (ref 0.00–1.49)
Prothrombin Time: 13.1 seconds (ref 11.6–15.2)

## 2013-08-14 LAB — CG4 I-STAT (LACTIC ACID): Lactic Acid, Venous: 0.77 mmol/L (ref 0.5–2.2)

## 2013-08-14 LAB — T4, FREE: Free T4: 1.21 ng/dL (ref 0.80–1.80)

## 2013-08-14 MED ORDER — LORAZEPAM 2 MG/ML IJ SOLN
0.5000 mg | Freq: Once | INTRAMUSCULAR | Status: AC
  Administered 2013-08-14: 0.5 mg via INTRAVENOUS

## 2013-08-14 MED ORDER — SELENIUM 50 MCG PO TABS
200.0000 ug | ORAL_TABLET | Freq: Every day | ORAL | Status: DC
  Filled 2013-08-14: qty 4

## 2013-08-14 MED ORDER — LORAZEPAM 2 MG/ML IJ SOLN
INTRAMUSCULAR | Status: AC
  Filled 2013-08-14: qty 1

## 2013-08-14 MED ORDER — OXYCODONE HCL ER 10 MG PO T12A: 10.0000 mg | EXTENDED_RELEASE_TABLET | Freq: Two times a day (BID) | ORAL | Status: DC

## 2013-08-14 MED ORDER — VANCOMYCIN HCL IN DEXTROSE 1-5 GM/200ML-% IV SOLN
1000.0000 mg | Freq: Once | INTRAVENOUS | Status: AC
  Administered 2013-08-14: 1000 mg via INTRAVENOUS
  Filled 2013-08-14: qty 200

## 2013-08-14 MED ORDER — SODIUM CHLORIDE 0.9 % IV BOLUS (SEPSIS)
1000.0000 mL | Freq: Once | INTRAVENOUS | Status: AC
  Administered 2013-08-14: 1000 mL via INTRAVENOUS

## 2013-08-14 MED ORDER — LORAZEPAM 2 MG/ML IJ SOLN
1.0000 mg | Freq: Once | INTRAMUSCULAR | Status: AC
  Administered 2013-08-14: 1 mg via INTRAVENOUS
  Filled 2013-08-14: qty 1

## 2013-08-14 MED ORDER — ASPIRIN EC 81 MG PO TBEC
81.0000 mg | DELAYED_RELEASE_TABLET | Freq: Every day | ORAL | Status: DC
  Filled 2013-08-14: qty 1

## 2013-08-14 MED ORDER — HEPARIN SODIUM (PORCINE) 5000 UNIT/ML IJ SOLN
5000.0000 [IU] | Freq: Three times a day (TID) | INTRAMUSCULAR | Status: DC
  Administered 2013-08-15 – 2013-08-20 (×17): 5000 [IU] via SUBCUTANEOUS
  Filled 2013-08-14 (×20): qty 1

## 2013-08-14 MED ORDER — ASPIRIN 300 MG RE SUPP
300.0000 mg | Freq: Every day | RECTAL | Status: DC
  Administered 2013-08-15 (×2): 300 mg via RECTAL
  Filled 2013-08-14 (×3): qty 1

## 2013-08-14 MED ORDER — ATORVASTATIN CALCIUM 40 MG PO TABS
40.0000 mg | ORAL_TABLET | Freq: Every day | ORAL | Status: DC
  Filled 2013-08-14: qty 1

## 2013-08-14 MED ORDER — SELENIUM 200 MCG PO TABS: 1.0000 | ORAL_TABLET | Freq: Every day | ORAL | Status: DC

## 2013-08-14 MED ORDER — MEROPENEM 1 G IV SOLR
1.0000 g | Freq: Once | INTRAVENOUS | Status: AC
  Administered 2013-08-14: 1 g via INTRAVENOUS
  Filled 2013-08-14: qty 1

## 2013-08-14 MED ORDER — ZIPRASIDONE MESYLATE 20 MG IM SOLR
10.0000 mg | Freq: Once | INTRAMUSCULAR | Status: AC
  Administered 2013-08-14: 10 mg via INTRAMUSCULAR
  Filled 2013-08-14: qty 20

## 2013-08-14 MED ORDER — SODIUM CHLORIDE 0.9 % IV BOLUS (SEPSIS)
500.0000 mL | Freq: Once | INTRAVENOUS | Status: AC
  Administered 2013-08-14: 500 mL via INTRAVENOUS

## 2013-08-14 MED ORDER — SODIUM CHLORIDE 0.9 % IV SOLN
INTRAVENOUS | Status: DC
  Administered 2013-08-14: 16:00:00 via INTRAVENOUS
  Administered 2013-08-15: 100 mL/h via INTRAVENOUS

## 2013-08-14 MED ORDER — HYDROCHLOROTHIAZIDE 25 MG PO TABS
25.0000 mg | ORAL_TABLET | Freq: Every day | ORAL | Status: DC
  Filled 2013-08-14: qty 1

## 2013-08-14 MED ORDER — BENAZEPRIL HCL 40 MG PO TABS
40.0000 mg | ORAL_TABLET | Freq: Every day | ORAL | Status: DC
  Filled 2013-08-14: qty 1

## 2013-08-14 MED ORDER — HYDROCODONE-ACETAMINOPHEN 7.5-325 MG PO TABS: 1.0000 | ORAL_TABLET | Freq: Four times a day (QID) | ORAL | Status: DC | PRN

## 2013-08-14 MED ORDER — DICLOFENAC SODIUM 50 MG PO TBEC
100.0000 mg | DELAYED_RELEASE_TABLET | Freq: Every day | ORAL | Status: DC
  Filled 2013-08-14: qty 2

## 2013-08-14 MED ORDER — VITAMIN B-6 100 MG PO TABS
100.0000 mg | ORAL_TABLET | Freq: Every day | ORAL | Status: DC
  Filled 2013-08-14: qty 1

## 2013-08-14 MED ORDER — THYROID 120 MG PO TABS
120.0000 mg | ORAL_TABLET | Freq: Every day | ORAL | Status: DC
Start: 2013-08-14 — End: 2013-08-14
  Filled 2013-08-14: qty 1

## 2013-08-14 MED ORDER — SODIUM CHLORIDE 0.9 % IJ SOLN
3.0000 mL | Freq: Two times a day (BID) | INTRAMUSCULAR | Status: DC
  Administered 2013-08-14 – 2013-08-20 (×5): 3 mL via INTRAVENOUS

## 2013-08-14 MED ORDER — INSULIN ASPART 100 UNIT/ML ~~LOC~~ SOLN
0.0000 [IU] | SUBCUTANEOUS | Status: DC
  Administered 2013-08-14: 5 [IU] via SUBCUTANEOUS
  Administered 2013-08-14: 3 [IU] via SUBCUTANEOUS
  Administered 2013-08-15: 1 [IU] via SUBCUTANEOUS
  Administered 2013-08-15: 2 [IU] via SUBCUTANEOUS
  Administered 2013-08-15: 1 [IU] via SUBCUTANEOUS
  Administered 2013-08-15: 2 [IU] via SUBCUTANEOUS
  Administered 2013-08-15: 3 [IU] via SUBCUTANEOUS
  Administered 2013-08-16: 2 [IU] via SUBCUTANEOUS
  Administered 2013-08-16: 5 [IU] via SUBCUTANEOUS
  Administered 2013-08-16: 7 [IU] via SUBCUTANEOUS
  Administered 2013-08-16 (×2): 2 [IU] via SUBCUTANEOUS
  Administered 2013-08-17: 7 [IU] via SUBCUTANEOUS
  Administered 2013-08-17: 9 [IU] via SUBCUTANEOUS

## 2013-08-14 MED ORDER — OMEGA-3-ACID ETHYL ESTERS 1 G PO CAPS
1.0000 g | ORAL_CAPSULE | Freq: Every day | ORAL | Status: DC
  Filled 2013-08-14: qty 1

## 2013-08-14 MED ORDER — INSULIN GLARGINE 100 UNIT/ML ~~LOC~~ SOLN
28.0000 [IU] | Freq: Every day | SUBCUTANEOUS | Status: DC
  Administered 2013-08-14 – 2013-08-18 (×5): 28 [IU] via SUBCUTANEOUS
  Filled 2013-08-14 (×7): qty 0.28

## 2013-08-14 NOTE — ED Notes (Signed)
Pt still will not lay flat for CT scan after giving Geodon 10mg . RN and NT positioned patient in the supine position, but patient continues to roll back to his right side. Patient does not follow commands. MD notified.

## 2013-08-14 NOTE — ED Provider Notes (Signed)
ECG interpretation   Date: 08/14/2013  Rate: 102  Rhythm: normal sinus rhythm  QRS Axis: normal  Intervals: normal  ST/T Wave abnormalities: normal  Conduction Disutrbances: none  Narrative Interpretation:   Old EKG Reviewed: no prior ecg   Lyanne Co, MD 08/14/13 [phone]

## 2013-08-14 NOTE — ED Notes (Signed)
Patient is resting comfortably, family at bedside

## 2013-08-14 NOTE — H&P (Signed)
Hospital Admission Note Date: 08/14/2013  Patient name: [name] Medical record number: [id number] Date of birth: 11-13-1940 Age: 72 y.o. Gender: male PCP: No primary provider on file.  Medical Service: IMTS  Attending physician:  Dr. Kem Kays     Internal Medicine Teaching Service Contact Information  Weekday Hours (7AM-5PM):   1st Contact: Dr. Yetta Barre Pager: [phone] 2nd Contact: Dr. Virgina Organ,  Pager; [phone]  ** If no return call within 15 minutes (after trying both pagers listed above), please call after hours pagers.   After 5 pm or weekends: 1st Contact: Pager: [phone] 2nd Contact: Pager: [phone]  Chief Complaint: AMS  History of Present Illness:  Patient is a 72 year old male with past medical history of DM-II, hypertension, hyperlipidemia, thyroid dysfunction, who presents with altered mental status.  History was obtained from patient's wife. Per his wife, patient has been doing well recently. He has been physically very active, doing a lot of yard works at home. Patient was last known normal at 2:00 p.m yesterday, when his wife called patient from work and had a normal conversation with patient. Patient was found to have altered mental status by his wife when she came home from work at about 10:00 PM. Per his wife, patient was sleeping in a recliner and was not responsive to any questions. He was found to have soiled himself with odor over his body. His wife thought that patient might have had low blood sugar. She tried to measure his blood sugar level without success since patient was agitated and did not allow her to do it. She gave him 3 glucose tablets. Patient followed his wife's command and took the glucose tablets without any choking. Then he also ate some food. After that, he was more responsive, but still could not answer questions appropriately. He answered "No" to all questions asked by his wife. Of note, patient was found to have sweating and agitation per his  wife, but no weakness in his extremities or facial droopy. Later on, patient went to the bathroom with the help of his wife. He was still confused and urinated on the floor. He had two minor falls. His wife called EMS and he was brought to the ED by EMS. His CBG was 245 at 3:30 AM in ED. Patient received one dose of Ziprasidone and ativan due to agitation. He was deeply sleeping when I saw patient in ED. His bp was 115/57 mmHg. His initial body temperature was 100.3 initially, the repeated measurement of the body temperature was 97.7. Patient received 1 dose of IV vancomycin and meropenem after blood culture was drawn in ED. By the time I completed interviewing his wife and my physical examination, patient woke up with confusion. He recognized his wife and said his own name right.  ROS:  No cough. It is difficult to assess whether he has chest pain, SOB, abdominal pain, diarrhea, constipation, dysuria, urgency, frequency, hematuria.    Meds: Current Outpatient Rx  Name  Route  Sig  Dispense  Refill  . aspirin EC 81 MG tablet   Oral   Take 81 mg by mouth daily.         Marland Kitchen atorvastatin (LIPITOR) 40 MG tablet   Oral   Take 40 mg by mouth daily.         . benazepril (LOTENSIN) 40 MG tablet   Oral   Take 40 mg by mouth daily.         . diclofenac (VOLTAREN) 50 MG EC tablet  Oral   Take 100 mg by mouth daily.         Marland Kitchen HUMULIN R 100 UNIT/ML injection   Subcutaneous   Inject 14 Units into the skin daily.          . hydrochlorothiazide (HYDRODIURIL) 25 MG tablet   Oral   Take 25 mg by mouth daily.         Marland Kitchen HYDROcodone-acetaminophen (NORCO) 7.5-325 MG per tablet      1 tablet every 6 (six) hours as needed for pain.          Marland Kitchen insulin glargine (LANTUS) 100 UNIT/ML injection   Subcutaneous   Inject 54 Units into the skin at bedtime.         . Omega-3 Fatty Acids (FISH OIL) 1200 MG CAPS   Oral   Take 1 capsule by mouth daily.         . OXYCONTIN 10 MG T12A 12 hr  tablet   Oral   Take 10 mg by mouth every 12 (twelve) hours.          . pyridOXINE (VITAMIN B-6) 100 MG tablet   Oral   Take 100 mg by mouth daily.         . Selenium 200 MCG TABS   Oral   Take 1 tablet by mouth daily.         Marland Kitchen SPIRULINA PO   Oral   Take 750 mg by mouth 3 (three) times daily.         Marland Kitchen thyroid (ARMOUR) 120 MG tablet   Oral   Take 120 mg by mouth daily before breakfast.           Allergies: Allergies as of 08/14/2013 - Review Complete 08/14/2013  Allergen Reaction Noted  . Penicillins Rash    Past Medical History  Diagnosis Date  . Arthritis   . Diabetes mellitus without complication   . Hypertension    History reviewed. No pertinent past surgical history. No family history on file. History   Social History  . Marital Status: Married    Spouse Name: N/A    Number of Children: N/A  . Years of Education: N/A   Occupational History  . Not on file.   Social History Main Topics  . Smoking status: Unknown If Ever Smoked  . Smokeless tobacco: Not on file  . Alcohol Use: No  . Drug Use: No  . Sexual Activity: Not on file   Other Topics Concern  . Not on file   Social History Narrative  . No narrative on file    Review of Systems: Full 14-point review of systems otherwise negative except as noted above in HPI.  Physical Exam:   Filed Vitals:   08/14/13 1045 08/14/13 1100 08/14/13 1115 08/14/13 1145  BP: 112/59 106/48 102/51 131/64  Pulse: 99 72 97 97  Temp:      TempSrc:      Resp:      SpO2: 96% 96% 95% 99%    General: patient is sleeping deeply, could not answers questions initially, but woke up by the end of my physical exam.  HEENT: PERRL, EOMI, no scleral icterus, No JVD or bruit Cardiac: S1/S2, RRR, No murmurs, gallops or rubs Pulm: Good air movement bilaterally. Clear to auscultation bilaterally. No rales, wheezing, rhonchi or rubs. Abd: Soft, nondistended, no organomegaly, BS present Ext: No edema. 2+DP/PT pulse  bilaterally. Small area of bruis over the left upper arm. Musculoskeletal: No joint deformities,  erythema Skin: No rashes.  Neuro: Partially oriented to person (recognized his wife). Not following commands. Actively resisting exam. Move all extremities. No facial droop. Neurologic exam is limited. Negative Babinski sign.  Lab results: Basic Metabolic Panel:  Recent Labs  82/95/62 0350  NA 140  K 4.5  CL 103  CO2 22  GLUCOSE 282*  BUN 18  CREATININE 0.95  CALCIUM 9.1   Liver Function Tests:  Recent Labs  08/14/13 0350  AST 27  ALT 26  ALKPHOS 90  BILITOT 0.8  PROT 6.4  ALBUMIN 3.6   No results found for this basename: LIPASE, AMYLASE,  in the last 72 hours  Recent Labs  08/14/13 0925  AMMONIA 23   CBC:  Recent Labs  08/14/13 0350  WBC 14.0*  NEUTROABS 8.3*  HGB 13.4  HCT 37.4*  MCV 87.2  PLT 228   Cardiac Enzymes:  Recent Labs  08/14/13 0350  TROPONINI <0.30   BNP: No results found for this basename: PROBNP,  in the last 72 hours D-Dimer: No results found for this basename: DDIMER,  in the last 72 hours CBG:  Recent Labs  08/14/13 0330 08/14/13 1122  GLUCAP 245* 269*   Hemoglobin A1C: No results found for this basename: HGBA1C,  in the last 72 hours Fasting Lipid Panel: No results found for this basename: CHOL, HDL, LDLCALC, TRIG, CHOLHDL, LDLDIRECT,  in the last 72 hours Thyroid Function Tests: No results found for this basename: TSH, T4TOTAL, FREET4, T3FREE, THYROIDAB,  in the last 72 hours Anemia Panel: No results found for this basename: VITAMINB12, FOLATE, FERRITIN, TIBC, IRON, RETICCTPCT,  in the last 72 hours Coagulation:  Recent Labs  08/14/13 0350  LABPROT 13.1  INR 1.01   Urine Drug Screen: Drugs of Abuse     Component Value Date/Time   LABOPIA NONE DETECTED 08/14/2013 0822   COCAINSCRNUR NONE DETECTED 08/14/2013 0822   LABBENZ NONE DETECTED 08/14/2013 0822   AMPHETMU NONE DETECTED 08/14/2013 0822   THCU NONE  DETECTED 08/14/2013 0822   LABBARB NONE DETECTED 08/14/2013 0822    Alcohol Level:  Recent Labs  08/14/13 0350  ETH <11   Urinalysis:  Recent Labs  08/14/13 0822  COLORURINE YELLOW  LABSPEC 1.022  PHURINE 6.0  GLUCOSEU >1000*  HGBUR NEGATIVE  BILIRUBINUR NEGATIVE  KETONESUR 40*  PROTEINUR NEGATIVE  UROBILINOGEN 1.0  NITRITE NEGATIVE  LEUKOCYTESUR NEGATIVE   Misc. Labs:  Imaging results:  Ct Head Wo Contrast  08/14/2013   CLINICAL DATA:  Altered mental status. Hypoglycemia.  EXAM: CT HEAD WITHOUT CONTRAST  TECHNIQUE: Contiguous axial images were obtained from the base of the skull through the vertex without intravenous contrast.  COMPARISON:  MRI brain 12/22/2008. Head CT 02/29/2010.  FINDINGS: Patient was scanned in the right lateral decubitus position due to condition. Reformatted images were obtained. There is no evidence of acute intracranial hemorrhage, mass lesion, brain edema or extra-axial fluid collection. Mild atrophy and mild chronic small vessel ischemic changes are stable. There is no evidence of cortical based infarct or hydrocephalus.  The visualized paranasal sinuses, mastoid air cells and middle ears are clear. The calvarium is intact.  IMPRESSION: No acute intracranial findings. Stable atrophy and chronic small vessel ischemic changes.   Electronically Signed   By: Roxy Horseman M.D.   On: 08/14/2013 09:23   Dg Chest Port 1 View  08/14/2013   CLINICAL DATA:  Altered mental status.  EXAM: PORTABLE CHEST - 1 VIEW  COMPARISON:  None.  FINDINGS: No  focal opacity or pulmonary edema. No effusion or pneumothorax. Cardiomegaly. Mediastinal contours distorted by rightward rotation. No acute osseous findings.  IMPRESSION: 1. No definitive acute cardiopulmonary disease. 2. Cardiomegaly. 3. When clinically able, repeat examination could evaluate the mediastinum - which is distorted by rotation.   Electronically Signed   By: Tiburcio Pea M.D.   On: 08/14/2013 05:44     Other results:  EKG: Sinus rhythm, regular, tachycardia with heart rates 102, marginally left axis deviation, normal axis, early R wave progression, normal QT interval, No ischemic change in T waves or ST segments.  Assessment & Plan by Problem: Principal Problem:   Altered mental status Active Problems:   DIABETES MELLITUS, TYPE II   HYPERLIPIDEMIA   HYPERTENSION  72 year old woman with a past medical history for diabetes, hypertension, thyroid dysfunction, who presents with altered mental status. CT head negative for acute abnormalities. Portable chest x-ray is no infiltration. Leukocytosis with WBC 14.0. Tm 100.3. Electrolytes normal with AG of 15 and HCO3 of 22. UDS negative. Urinalysis negative for UTI. Lactic acid 0.77. Blood alcohol level <11.   #: Acute encephalopathy: Etiology is not completely clear at this moment. Hypoglycemia is one of the potential differential diagnoses. Per his wife, patient had similar episodes in the past. He was hospitalized due to similar presentation 2010. Patient was found to have hypoglycemia with CBG of 54. He had a negative workup including CT head and MRI for stroke. In consistent with this diagnosis, patient was found to have had sweating, agitation and confusion by his wife. He responded partially to the treatment with glucose tablets and food intake. Other differential diagnoses include, but less likely, TIA/stroke (unlikely, no signs of weakness, facial droop, CT head negative); seizure (patient was not found to have seizure activity by his wife. He did not have seizure history. His past EEG was negative for seizure 2009); hepatic encephalopathy(no history of cirrhosis, liver function was normal on admission); drug overdose (UDS negative); Infection (though possible given leukocytosis of WBC 14 and fever with temperature 100.3, yet no obvious site of infection was detected. Pneumonia is unlikely given no cough and a negative chest x-ray. UTI is  unlikely given negative UA for UTI);  cardiac arrhythmia (less likely, patient does not have a history of heart tissue, no arrhythmia on EKG); pulmonary embolism (Well's score was low probability, patient seems to not have any chest pain after he woke up. His oxygen saturations is normal on room air); meningitis is also less likely (He does not have headache after woke up and his repeated body temperature is normal). Another possibility is severe hypothyroidism-coma, which can not be completely ruled out at this moment. Patient was diagnosed with Graves' disease 2010, but obviously converted to hypothyroidism since patient is currently on Armour at home. Patient received 1 dose of IV vancomycin and meropenem in ED after blood culture was drawn. Patient's mental status improved slightly now.  -will admit to step down unit for observation -monitor on Tele -Neuro checks every 2 hours -N.p.o. until swallow test done -Pending blood culture.  -will not continue antibiotics for now. Patient's repeated the temperature is normal. No infection site was identified. -stat TSH, Free T4 and T3. -trop X 1 -Fall precaution -IVF: NS 100 cc/h -will consider MRI or LP if not improve -Medical record was requested by calling his PCP's office (Dr. Tora Kindred [phone])  #: HTN: bp is 115/ 57 on admission. No signs of congestive heart failure. -Will continue home medications: Lotensin 40 mg  daily, hydrochlorothiazide 25 mg daily  #: HLD: Patient is on Lipitor 40 mg daily. Liver functions normal -will continue Lipitor 40 mg daily.  #: hx of Graves' disease: it was diagnosed 2010. Obviously converted to hypothyroidism since patient is currently on ARMOUR at home. -Will check stat TSH, free T4/T3 to rule out possibility of severe hypothyroidism coma. -Will continue home medications, Armour for now.  #:  DM-II: no A1c in Epic. Patient was on Coumadin and Lantus 54 units daily at home. -will start half home dose of  lantus now given NPO: 28 U daily. -check A1c  #: AG of 15:  It is likely due to starvation. Patient's bicarbonate is 22, does not look like to have DKA. Lactic acid is normal.  -ivf: ns 100 CC/H -F/U bmp  #  F/E/N  -NS: IV 100 cc/hr -f/u BMP -NPO until swallowing test done  # DVT px: Heparin sq   Dispo: Disposition is deferred at this time, awaiting improvement of current medical problems. Anticipated discharge in approximately 1 day(s).   The patient does have a current PCP (No primary provider on file.), therefore will be requiring OPC follow-up after discharge.   The patient does not have transportation limitations that hinder transportation to clinic appointments.  Signed:  Lorretta Harp, MD PGY3, Internal Medicine Teaching Service Pager: [phone]  08/14/2013, 11:50 AM

## 2013-08-14 NOTE — Progress Notes (Signed)
cCRITICAL VALUE ALERT  Critical value received:  MRSA + by PCR  Date of notification:  08/14/13  Time of notification:  15:30  Critical value read back:yes  Nurse who received alert:  J. Meredeth Ide  MD notified (1st page): Dr. Maurice March  Time of first page: 16:30    MD notified (2nd page):  Time of second page:  Responding MD:  Resident on call   Time MD responded: 16:40

## 2013-08-14 NOTE — ED Notes (Signed)
Pt from home.  Per EMS, pt LSN yesterday at 1200.  Pt's family member returned home and found pt in wheelchair nonverbal at 2330.  Pt's family member thought it was a glucose issue they were giving him glucose tablets.  EMS reports CBG 246.  Pt has urinated and defecated on himself.  Pt non-verbal, does not follow commands.  Pt appears to move all extremities as he fights against this RN during the assessment.

## 2013-08-14 NOTE — ED Notes (Signed)
Cannot get patient to lay flat. Patient rolls back to right side when placed flat. Patient becomes agitated when trying to position him flat. Patient does not follow commands.

## 2013-08-14 NOTE — ED Provider Notes (Signed)
Apiration of blood/fluid Performed by: Lyanne Co Consent obtained. Required items: required blood products, implants, devices, and special equipment available Patient identity confirmed: verbally with patient Time out: Immediately prior to procedure a "time out" was called to verify the correct patient, procedure, equipment, support staff and site/side marked as required. Preparation: Patient was prepped and draped in the usual sterile fashion. Patient tolerance: Patient tolerated the procedure well with no immediate complications. Location of aspiration: right antecubital fossa (blood obtained for sampling)     Lyanne Co, MD 08/14/13 [phone]

## 2013-08-14 NOTE — ED Notes (Signed)
Patient is resting comfortably, and family is at bedside.

## 2013-08-14 NOTE — ED Notes (Signed)
Unable to obtain head CT due to pt movement.  MD aware.  0.5mg  Ativan ordered.

## 2013-08-14 NOTE — ED Notes (Signed)
Family at bedside. 

## 2013-08-14 NOTE — ED Notes (Signed)
Patient confused pulling at his lines and monitor leads.  Had to reorient the patient.

## 2013-08-14 NOTE — ED Provider Notes (Signed)
CSN: [id number]     Arrival date & time 08/14/13  0221 History   First MD Initiated Contact with Patient 08/14/13 0300     Chief Complaint  Patient presents with  . Altered Mental Status   (Consider location/radiation/quality/duration/timing/severity/associated sxs/prior Treatment) HPI Patient brought in by EMS for altered mental status. Wife at bedside states that she last spoke to her husband at 2 PM yesterday and he was at his baseline. When she came home from work at 10:30 he was not speaking to her and seemed to be more confused. She thought that maybe his blood sugar was low and gave him some sugar tablets. He had defecated and urinated on himself. He was not following commands. Wife states the patient is normally verbal and can care for some of his activities of daily living. Patient is unable to contribute history. Level V caveat applies. Past Medical History  Diagnosis Date  . Arthritis   . Diabetes mellitus without complication   . Hypertension    History reviewed. No pertinent past surgical history. No family history on file. History  Substance Use Topics  . Smoking status: Unknown If Ever Smoked  . Smokeless tobacco: Not on file  . Alcohol Use: No    Review of Systems  Unable to perform ROS: Mental status change    Allergies  Penicillins  Home Medications   Current Outpatient Rx  Name  Route  Sig  Dispense  Refill  . aspirin EC 81 MG tablet   Oral   Take 81 mg by mouth daily.         Marland Kitchen atorvastatin (LIPITOR) 40 MG tablet   Oral   Take 40 mg by mouth daily.         . benazepril (LOTENSIN) 40 MG tablet   Oral   Take 40 mg by mouth daily.         . diclofenac (VOLTAREN) 50 MG EC tablet   Oral   Take 100 mg by mouth daily.         Marland Kitchen HUMULIN R 100 UNIT/ML injection   Subcutaneous   Inject 14 Units into the skin daily.          . hydrochlorothiazide (HYDRODIURIL) 25 MG tablet   Oral   Take 25 mg by mouth daily.         Marland Kitchen  HYDROcodone-acetaminophen (NORCO) 7.5-325 MG per tablet      1 tablet every 6 (six) hours as needed for pain.          Marland Kitchen insulin glargine (LANTUS) 100 UNIT/ML injection   Subcutaneous   Inject 54 Units into the skin at bedtime.         . Omega-3 Fatty Acids (FISH OIL) 1200 MG CAPS   Oral   Take 1 capsule by mouth daily.         . OXYCONTIN 10 MG T12A 12 hr tablet   Oral   Take 10 mg by mouth every 12 (twelve) hours.          . pyridOXINE (VITAMIN B-6) 100 MG tablet   Oral   Take 100 mg by mouth daily.         . Selenium 200 MCG TABS   Oral   Take 1 tablet by mouth daily.         Marland Kitchen SPIRULINA PO   Oral   Take 750 mg by mouth 3 (three) times daily.         Marland Kitchen thyroid (ARMOUR)  120 MG tablet   Oral   Take 120 mg by mouth daily before breakfast.          BP 115/53  Temp(Src) 99.6 F (37.6 C) (Axillary)  Resp 12  SpO2 95% Physical Exam  Nursing note and vitals reviewed. Constitutional: He appears well-developed and well-nourished. No distress.  Smells of feces  HENT:  Head: Normocephalic and atraumatic.  Unable to examine the oropharynx due to the patient  noncompliance   Eyes: EOM are normal. Pupils are equal, round, and reactive to light.  Neck: Normal range of motion. Neck supple.  Cardiovascular: Normal rate and regular rhythm.   Pulmonary/Chest: Effort normal and breath sounds normal. No respiratory distress. He has no wheezes. He has no rales. He exhibits no tenderness.  Abdominal: Soft. Bowel sounds are normal. He exhibits no distension and no mass. There is no tenderness. There is no rebound and no guarding.  Musculoskeletal: Normal range of motion. He exhibits no edema and no tenderness.  Neurological:  Awake. Not following commands. Actively resisting exam. Not speaking. Appears to move all extremities. Neurologic exam is limited.  Skin: Skin is warm and dry. No rash noted. No erythema.    ED Course  Procedures (including critical care  time) Labs Review Labs Reviewed  GLUCOSE, CAPILLARY - Abnormal; Notable for the following:    Glucose-Capillary 245 (*)    All other components within normal limits  CBC WITH DIFFERENTIAL  COMPREHENSIVE METABOLIC PANEL  TROPONIN I  URINALYSIS, ROUTINE W REFLEX MICROSCOPIC  PROTIME-INR  APTT  ETHANOL  URINE RAPID DRUG SCREEN (HOSP PERFORMED)   Imaging Review No results found.  EKG Interpretation   None       MDM  We'll work the patient up for altered mental status. Suspect infectious origin. Patient is signed out to oncoming EDP pending completion of workup and likely admission.  Loren Racer, MD 08/16/13 1536

## 2013-08-14 NOTE — ED Notes (Signed)
Pt's wife at bedside.  Pt not a good historian to pt's medical history.  Wife states she last saw pt at 1200 yesterday, but spoke to him on the phone at 1400.  She states pt seemed normal at 1400.  Pt states she returned home at 2230 last night and found pt in his recliner.  She states pt was not verbal when she came home so she assumed his glucose was low.  She gave him 3 glucose tabs and 4 PB crackers.  Pt remains non verbal and does not follow commands.  Pt now trying to get out of bed.  Seizure pads placed on bed so pt doesn't get arms/legs caught in rails.

## 2013-08-14 NOTE — ED Notes (Signed)
Lactic acid results shown to Dr. Ranae Palms

## 2013-08-14 NOTE — ED Provider Notes (Signed)
10:03 AM No clear source for delerium at this time. OPC to evaluate the patient and admit. Covered with abx.   Lyanne Co, MD 08/14/13 1003

## 2013-08-15 ENCOUNTER — Inpatient Hospital Stay (HOSPITAL_COMMUNITY): Payer: Medicare Other

## 2013-08-15 ENCOUNTER — Observation Stay (HOSPITAL_COMMUNITY): Payer: Medicare Other

## 2013-08-15 DIAGNOSIS — I10 Essential (primary) hypertension: Secondary | ICD-10-CM

## 2013-08-15 DIAGNOSIS — R4182 Altered mental status, unspecified: Secondary | ICD-10-CM

## 2013-08-15 DIAGNOSIS — G9340 Encephalopathy, unspecified: Principal | ICD-10-CM

## 2013-08-15 DIAGNOSIS — E119 Type 2 diabetes mellitus without complications: Secondary | ICD-10-CM

## 2013-08-15 LAB — GLUCOSE, CAPILLARY
Glucose-Capillary: 186 mg/dL — ABNORMAL HIGH (ref 70–99)
Glucose-Capillary: 204 mg/dL — ABNORMAL HIGH (ref 70–99)
Glucose-Capillary: 120 mg/dL — ABNORMAL HIGH (ref 70–99)
Glucose-Capillary: 138 mg/dL — ABNORMAL HIGH (ref 70–99)
Glucose-Capillary: 151 mg/dL — ABNORMAL HIGH (ref 70–99)
Glucose-Capillary: 217 mg/dL — ABNORMAL HIGH (ref 70–99)

## 2013-08-15 LAB — CBC
HCT: 33.4 % — ABNORMAL LOW (ref 39.0–52.0)
Hemoglobin: 11.6 g/dL — ABNORMAL LOW (ref 13.0–17.0)
MCHC: 34.7 g/dL (ref 30.0–36.0)
Platelets: 185 10*3/uL (ref 150–400)
RDW: 14.4 % (ref 11.5–15.5)
WBC: 10 10*3/uL (ref 4.0–10.5)

## 2013-08-15 LAB — BASIC METABOLIC PANEL
BUN: 18 mg/dL (ref 6–23)
Calcium: 8.3 mg/dL — ABNORMAL LOW (ref 8.4–10.5)
Chloride: 108 mEq/L (ref 96–112)
Creatinine, Ser: 0.98 mg/dL (ref 0.50–1.35)
GFR calc Af Amer: >90 mL/min (ref >90)
GFR calc non Af Amer: 80 mL/min — ABNORMAL LOW (ref >90)
Potassium: 3.6 mEq/L (ref 3.5–5.1)
Sodium: 141 mEq/L (ref 135–145)

## 2013-08-15 MED ORDER — VANCOMYCIN HCL 10 G IV SOLR
2000.0000 mg | Freq: Once | INTRAVENOUS | Status: AC
  Administered 2013-08-15: 2000 mg via INTRAVENOUS
  Filled 2013-08-15: qty 2000

## 2013-08-15 MED ORDER — MUPIROCIN 2 % EX OINT
1.0000 "application " | TOPICAL_OINTMENT | Freq: Two times a day (BID) | CUTANEOUS | Status: AC
  Administered 2013-08-15 – 2013-08-19 (×10): 1 via NASAL
  Filled 2013-08-15 (×2): qty 22

## 2013-08-15 MED ORDER — LORAZEPAM 2 MG/ML IJ SOLN
0.5000 mg | Freq: Once | INTRAMUSCULAR | Status: AC
  Administered 2013-08-15: 0.5 mg via INTRAVENOUS
  Filled 2013-08-15: qty 1

## 2013-08-15 MED ORDER — DEXTROSE 5 % IV SOLN
800.0000 mg | Freq: Three times a day (TID) | INTRAVENOUS | Status: DC
  Administered 2013-08-15 (×2): 800 mg via INTRAVENOUS
  Filled 2013-08-15 (×4): qty 16

## 2013-08-15 MED ORDER — LORAZEPAM 2 MG/ML IJ SOLN
2.0000 mg | Freq: Once | INTRAMUSCULAR | Status: AC
  Administered 2013-08-15: via INTRAVENOUS

## 2013-08-15 MED ORDER — THYROID 120 MG PO TABS
120.0000 mg | ORAL_TABLET | Freq: Every day | ORAL | Status: DC
  Administered 2013-08-16 – 2013-08-20 (×5): 120 mg via ORAL
  Filled 2013-08-15 (×6): qty 1

## 2013-08-15 MED ORDER — VANCOMYCIN HCL IN DEXTROSE 1-5 GM/200ML-% IV SOLN
1000.0000 mg | Freq: Two times a day (BID) | INTRAVENOUS | Status: DC
  Administered 2013-08-15 – 2013-08-16 (×2): 1000 mg via INTRAVENOUS
  Filled 2013-08-15 (×3): qty 200

## 2013-08-15 MED ORDER — SODIUM CHLORIDE 0.9 % IV SOLN
INTRAVENOUS | Status: DC
  Administered 2013-08-16 – 2013-08-17 (×2): via INTRAVENOUS

## 2013-08-15 MED ORDER — CHLORHEXIDINE GLUCONATE CLOTH 2 % EX PADS
6.0000 | MEDICATED_PAD | Freq: Every day | CUTANEOUS | Status: AC
  Administered 2013-08-15 – 2013-08-19 (×5): 6 via TOPICAL

## 2013-08-15 MED ORDER — INFLUENZA VAC SPLIT QUAD 0.5 ML IM SUSP
0.5000 mL | INTRAMUSCULAR | Status: AC
  Administered 2013-08-16: 0.5 mL via INTRAMUSCULAR
  Filled 2013-08-15: qty 0.5

## 2013-08-15 NOTE — H&P (Signed)
INTERNAL MEDICINE TEACHING SERVICE Attending Admission Note  Date: 08/15/2013  Patient name: [name]  Medical record number: [id number]  Date of birth: December 09, 1940    I have seen and evaluated [name] and discussed their care with the Residency Team.  72 year old male with hx of Type 2 DM, HL, hypothyroidism, HTN, with altered mental status.   He has acute encephalopathy. This morning he is noted to have +BC, would need to find source. For now, continue treatment with Vancomycin and would perform LP if neurology feels strongly about this. Otherwise, continue to culture if spikes fevers.   Would repeat CXR with a PA and Lateral film, portable film is limited.  He is more alert this morning.  [name], DO, FACP [facility] Internal Medicine Residency Program 08/15/2013, 10:34 AM

## 2013-08-15 NOTE — Progress Notes (Addendum)
CRITICAL VALUE ALERT  Critical value received:  Aerobic blood culture Gram + cocci in clusters  Date of notification:  10/16  Time of notification:  0608  Critical value read back:yes  Nurse who received alert:  E Hendel Gatliff RN  MD notified (1st page):  Audley Hose MD    Time of first page:  [phone]  MD notified (2nd page):  Time of second page:  Responding MD:    Time MD responded:

## 2013-08-15 NOTE — Consult Note (Signed)
Neurology Consultation Reason for Consult: Altered mental status Referring Physician: Paya, A  CC: Altered mental status  History is obtained from: Wife  HPI: [name] is a 72 y.o. male with a history of diabetes who was in his normal state of health at 2 PM 10/14. When his wife returned from work at 10 PM, she found him altered. She initially suspected that his sugar was low seeing "that was how his eyes looked." She did not take his sugar as he was being combative.  She given 3 glucose tablets which he ate. He had defecated and urinated on himself. He had some of his car waxing things and she suspects that he had waxed his car earlier in the day.  In the ED, he was found to have low-grade fever of 100.3 as well as leukocytosis of 14. No source for this has been found.  LKW: 2 PM 10/14 tpa given: no, outside of window    ROS: Unable to assess secondary to patient's altered mental status.    Past Medical History  Diagnosis Date  . Arthritis   . Diabetes mellitus without complication   . Hypertension     Family History: Unable to assess secondary to patient's altered mental status.    Social History: Tob: Unable to assess secondary to patient's altered mental status.    Exam: Current vital signs: BP 138/60  Pulse 98  Temp(Src) 98.6 F (37 C) (Axillary)  Resp 21  Ht 6\' 1"  (1.854 m)  Wt 102.1 kg (225 lb 1.4 oz)  BMI 29.7 kg/m2  SpO2 100% Vital signs in last 24 hours: Temp:  [98 F (36.7 C)-100.3 F (37.9 C)] 98.6 F (37 C) (10/15 2000) Pulse Rate:  [43-102] 98 (10/15 2000) Resp:  [12-21] 21 (10/15 2000) BP: (101-154)/(48-100) 138/60 mmHg (10/15 2000) SpO2:  [91 %-100 %] 100 % (10/15 2000) Weight:  [102.1 kg (225 lb 1.4 oz)] 102.1 kg (225 lb 1.4 oz) (10/15 1500)  General: In bed, NAD CV: Regular rate and rhythm Mental Status: Patient is able to tell me his name, makes frequent errors in speech, with nonsensical words. Other times, he is able to say  complete phrases. He responds "no" repeatedly. He does not correctly identify his location or the date  He wiggles his toes to command, but is unable to show 2 fingers. Cranial Nerves: II: He responds to stimuli in both visual fields. Pupils are equal, round, and reactive to light.  Discs are difficult to visualize. III,IV, VI: EOMI without ptosis or diploplia.  V: Facial sensation is symmetric to temperature VII: Facial movement is symmetric.  VIII: Intact to voice  X, XI, XII: Unable to assess secondary to patient's altered mental status.  Motor: Tone is normal. Bulk is normal. Does not cooperate with formal testing, however he appears to be moving all extremities to command. Sensory: Response to noxious stimuli x4 Deep Tendon Reflexes: 2+ and symmetric in the biceps and patellae.  Cerebellar: Unable to assess secondary to patient's altered mental status.  Gait: Unable to assess secondary to patient's altered mental status.   I have reviewed labs in epic and the results pertinent to this consultation are: Ammonia-normal  TSH-normal Blood cultures-pending UA-unremarkable CBC-leukocytosis   I have reviewed the images obtained: CT head-unremarkable  Impression: 72 year old male with altered mental status of unclear etiology. Given that a low-grade temperature and leukocytosis are also unexplained, I do feel that lumbar puncture is indicated. I can do this at the bedside, but was  unable to obtain at and therefore recommended to be done under fluoroscopy.   I feel that bacterial meningitis is very unlikely given the lack of meningismus, headache, and relatively mild appearance. Viral encephalitis, however is a more likely possibility.  Also possible would be a stroke with resultant aphasia, though the incontinence would be unusual. Seizures can also present with confusion, and an EEG could rule out frontal status epilepticus.    Recommendations: 1) EEG 2) MRI brain 3) LP under  fluoroscopy sent for hsv by PCR, cell count with differential, protein, glucose, fungal stain. Further studies dependent on initial results. 4) would favor starting empiric acyclovir pending this test   Ritta Slot, MD [facility] [phone]  If 7pm- 7am, please page neurology on call at [phone].

## 2013-08-15 NOTE — Procedures (Signed)
Indication: Altered Mental Status  Risks of the procedure were dicussed with the patient including post-LP headache, bleeding, infection, weakness/numbness of legs(radiculopathy), death.  The patient/patient's proxy agreed and written consent was obtained.   The patient was prepped and draped, and using sterile technique a 20 gauge quinke spinal needle was inserted in the L3-4 space, after multiple attempts with bony resistence, L4-5 was attempted as well, again without return of CSF.   Will need to attempt under IR.

## 2013-08-15 NOTE — Progress Notes (Signed)
ANTIBIOTIC CONSULT NOTE - INITIAL  Pharmacy Consult for vancomycin Indication: gpc in blood  Allergies  Allergen Reactions  . Penicillins Rash    Patient Measurements: Height: 6\' 1"  (185.4 cm) Weight: 220 lb 14.4 oz (100.2 kg) IBW/kg (Calculated) : 79.9   Vital Signs: Temp: 98 F (36.7 C) (10/16 0100) Temp src: Axillary (10/16 0100) BP: 116/58 mmHg (10/16 0500) Pulse Rate: 79 (10/16 0500) Intake/Output from previous day: 10/15 0701 - 10/16 0700 In: 1638.7 [I.V.:1306.7; IV Piggyback:332] Out: 1200 [Urine:1200] Intake/Output from this shift:    Labs:  Recent Labs  08/14/13 0350 08/15/13 0420  WBC 14.0* 10.0  HGB 13.4 11.6*  PLT 228 185  CREATININE 0.95 0.98   Estimated Creatinine Clearance: 83.6 ml/min (by C-G formula based on Cr of 0.98). No results found for this basename: VANCOTROUGH, [name], VANCORANDOM, GENTTROUGH, GENTPEAK, GENTRANDOM, TOBRATROUGH, TOBRAPEAK, TOBRARND, AMIKACINPEAK, AMIKACINTROU, AMIKACIN,  in the last 72 hours   Microbiology: Recent Results (from the past 720 hour(s))  CULTURE, BLOOD (ROUTINE X 2)     Status: None   Collection Time    08/14/13  8:50 AM      Result Value Range Status   Specimen Description BLOOD LEFT ANTECUBITAL   Final   Special Requests BOTTLES DRAWN AEROBIC AND ANAEROBIC 5CC   Final   Culture  Setup Time     Final   Value: 08/14/2013 14:06     Performed at [facility]   Culture     Final   Value: GRAM POSITIVE COCCI IN CLUSTERS     2956 Note: Gram Stain Report Called to,Read Back By and Verified With: Deborra Medina 08/15/2013 FULKC     Performed at [facility]   Report Status PENDING   Incomplete  MRSA PCR SCREENING     Status: Abnormal   Collection Time    08/14/13  3:13 PM      Result Value Range Status   MRSA by PCR POSITIVE (*) NEGATIVE Final   Comment:            The GeneXpert MRSA Assay (FDA     approved for NASAL specimens     only), is one component of a     comprehensive MRSA  colonization     surveillance program. It is not     intended to diagnose MRSA     infection nor to guide or     monitor treatment for     MRSA infections.     RESULT CALLED TO, READ BACK BY AND VERIFIED WITH:     FLEMING RN 17:00 08/14/13 ([email or web address])    Medical History: Past Medical History  Diagnosis Date  . Arthritis   . Diabetes mellitus without complication   . Hypertension     Medications:  Prescriptions prior to admission  Medication Sig Dispense Refill  . aspirin EC 81 MG tablet Take 81 mg by mouth daily.      Marland Kitchen atorvastatin (LIPITOR) 40 MG tablet Take 40 mg by mouth daily.      . benazepril (LOTENSIN) 40 MG tablet Take 40 mg by mouth daily.      . diclofenac (VOLTAREN) 50 MG EC tablet Take 100 mg by mouth daily.      Marland Kitchen HUMULIN R 100 UNIT/ML injection Inject 14 Units into the skin daily.       . hydrochlorothiazide (HYDRODIURIL) 25 MG tablet Take 25 mg by mouth daily.      Marland Kitchen HYDROcodone-acetaminophen (NORCO) 7.5-325 MG per tablet 1  tablet every 6 (six) hours as needed for pain.       Marland Kitchen insulin glargine (LANTUS) 100 UNIT/ML injection Inject 54 Units into the skin at bedtime.      . Omega-3 Fatty Acids (FISH OIL) 1200 MG CAPS Take 1 capsule by mouth daily.      . OXYCONTIN 10 MG T12A 12 hr tablet Take 10 mg by mouth every 12 (twelve) hours.       . pyridOXINE (VITAMIN B-6) 100 MG tablet Take 100 mg by mouth daily.      . Selenium 200 MCG TABS Take 1 tablet by mouth daily.      Marland Kitchen SPIRULINA PO Take 750 mg by mouth 3 (three) times daily.      Marland Kitchen thyroid (ARMOUR) 120 MG tablet Take 120 mg by mouth daily before breakfast.       Assessment: 72 yo man admitted with AMS and now to start vancomycin for gpc in blood.  His CrCl ~84 ml/min.    Goal of Therapy:  Vancomycin trough level 15-20 mcg/ml  Plan:  Vancomycin 2 gm IV X 1 then 1gm IV q12 hours as per obesity nomogram. F/u cultures, renal function and clinical course.  [name] 08/15/2013,7:16 AM

## 2013-08-15 NOTE — Evaluation (Signed)
Clinical/Bedside Swallow Evaluation Patient Details  Name: [name] MRN: [id number] Date of Birth: Mar 24, 1941  Today's Date: 08/15/2013 Time: 1050-1106 SLP Time Calculation (min): 16 min  Past Medical History:  Past Medical History  Diagnosis Date  . Arthritis   . Diabetes mellitus without complication   . Hypertension    Past Surgical History: History reviewed. No pertinent past surgical history. HPI:  72 year old male with altered mental status/acute encephalopathy of unclear etiology, positive low-grade temperature and leukocytosis. Initial CT of the head and CXR both negative. MRI brain pending.    Assessment / Plan / Recommendation Clinical Impression  Patient presents with a functional oropharyngeal swallow without overt indication of aspiration. Education complete regarding general safe swallowing precautions and s/s of aspiration with wife. No SLP f/u indicated at this time.     Aspiration Risk  Mild    Diet Recommendation Regular;Thin liquid   Liquid Administration via: Cup;Straw Medication Administration: Whole meds with liquid Supervision: Patient able to self feed;Intermittent supervision to cue for compensatory strategies Compensations: Slow rate;Small sips/bites Postural Changes and/or Swallow Maneuvers: Seated upright 90 degrees    Other  Recommendations Oral Care Recommendations: Oral care BID   Follow Up Recommendations  None    Frequency and Duration        Pertinent Vitals/Pain n/a        Swallow Study    General HPI: 72 year old male with altered mental status/acute encephalopathy of unclear etiology, positive low-grade temperature and leukocytosis. Initial CT of the head and CXR both negative. MRI brain pending.  Type of Study: Bedside swallow evaluation Previous Swallow Assessment: none Diet Prior to this Study: NPO Temperature Spikes Noted: Yes Respiratory Status: Room air History of Recent Intubation: No Behavior/Cognition:  Alert;Confused Oral Cavity - Dentition: Adequate natural dentition Self-Feeding Abilities: Able to feed self Patient Positioning: Upright in bed Baseline Vocal Quality: Clear Volitional Cough: Cognitively unable to elicit Volitional Swallow: Able to elicit    Oral/Motor/Sensory Function Overall Oral Motor/Sensory Function: Appears within functional limits for tasks assessed   Ice Chips Ice chips: Not tested   Thin Liquid Thin Liquid: Within functional limits    Nectar Thick Nectar Thick Liquid: Not tested   Honey Thick Honey Thick Liquid: Not tested   Puree Puree: Within functional limits   Solid   GO   Taggart Prasad MA, CCC-SLP [phone]  Solid: Within functional limits       Jazzalyn Loewenstein Meryl 08/15/2013,11:06 AM

## 2013-08-15 NOTE — Progress Notes (Signed)
Subjective: [name] is a 72 y.o. male w/ PMHx of of DM-II, hypertension, hyperlipidemia, thyroid dysfunction, who presented on 08/14/13 with altered mental status.  Today, the patient seems slightly improved, less agitated. Patient still unable to answer questions appropriately, A&O x1. When asked, denies any SOB, chest pain, or dysuria.   Blood cultures from yesterday came back +ve x2 for gram +ve cocci in clusters. Source of infection unclear at this time.   Objective: Vital signs in last 24 hours: Filed Vitals:   08/15/13 0300 08/15/13 0400 08/15/13 0500 08/15/13 0850  BP: 107/46 110/65 116/58 120/60  Pulse: 85 75 79 66  Temp:    98.3 F (36.8 C)  TempSrc:    Oral  Resp: 22 22 19 19   Height:      Weight:   220 lb 14.4 oz (100.2 kg)   SpO2: 95% 99% 98% 98%   Weight change:   Intake/Output Summary (Last 24 hours) at 08/15/13 1058 Last data filed at 08/15/13 0850  Gross per 24 hour  Intake 1638.67 ml  Output   1250 ml  Net 388.67 ml   Physical Exam: General: Alert, cooperative, and in no apparent distress HEENT: Vision grossly intact, oropharynx clear and non-erythematous  Neck: Full range of motion without pain, supple, no lymphadenopathy or carotid bruits Lungs: Air entry equal bilaterally. Mild bibasilar crackles to auscultation. No wheezes. Heart: Regular rate and rhythm, no murmurs, gallops, or rubs Abdomen: Soft, non-tender, non-distended, normal bowel sounds Extremities: No cyanosis, clubbing, or edema Neurologic: Alert & oriented x1, speech unclear, still answering questions inappropriately. Cranial nerves unable to be fully assessed due to mental status, however, CN VII intact, strength grossly intact, sensation generally intact to light touch.  Lab Results: Basic Metabolic Panel:  Recent Labs Lab 08/14/13 0350 08/15/13 0420  NA 140 141  K 4.5 3.6  CL 103 108  CO2 22 24  GLUCOSE 282* 140*  BUN 18 18  CREATININE 0.95 0.98  CALCIUM 9.1 8.3*    Liver Function Tests:  Recent Labs Lab 08/14/13 0350  AST 27  ALT 26  ALKPHOS 90  BILITOT 0.8  PROT 6.4  ALBUMIN 3.6   No results found for this basename: LIPASE, AMYLASE,  in the last 168 hours  Recent Labs Lab 08/14/13 0925  AMMONIA 23   CBC:  Recent Labs Lab 08/14/13 0350 08/15/13 0420  WBC 14.0* 10.0  NEUTROABS 8.3*  --   HGB 13.4 11.6*  HCT 37.4* 33.4*  MCV 87.2 88.4  PLT 228 185   Cardiac Enzymes:  Recent Labs Lab 08/14/13 0350 08/14/13 1109 08/14/13 1115  CKTOTAL  --  221  --   TROPONINI <0.30  --  <0.30   CBG:  Recent Labs Lab 08/14/13 1122 08/14/13 1550 08/14/13 2150 08/15/13 0030 08/15/13 0522 08/15/13 0849  GLUCAP 269* 257* 204* 186* 120* 138*   Hemoglobin A1C:  Recent Labs Lab 08/14/13 1622  HGBA1C 6.8*   Thyroid Function Tests:  Recent Labs Lab 08/14/13 1109  TSH 0.696  FREET4 1.21  T3FREE 2.2*   Coagulation:  Recent Labs Lab 08/14/13 0350  LABPROT 13.1  INR 1.01   Urine Drug Screen: Drugs of Abuse     Component Value Date/Time   LABOPIA NONE DETECTED 08/14/2013 0822   COCAINSCRNUR NONE DETECTED 08/14/2013 0822   LABBENZ NONE DETECTED 08/14/2013 0822   AMPHETMU NONE DETECTED 08/14/2013 0822   THCU NONE DETECTED 08/14/2013 0822   LABBARB NONE DETECTED 08/14/2013 1610  Alcohol Level:  Recent Labs Lab 08/14/13 0350  ETH <11   Urinalysis:  Recent Labs Lab 08/14/13 0822  COLORURINE YELLOW  LABSPEC 1.022  PHURINE 6.0  GLUCOSEU >1000*  HGBUR NEGATIVE  BILIRUBINUR NEGATIVE  KETONESUR 40*  PROTEINUR NEGATIVE  UROBILINOGEN 1.0  NITRITE NEGATIVE  LEUKOCYTESUR NEGATIVE   Micro Results: Recent Results (from the past 240 hour(s))  CULTURE, BLOOD (ROUTINE X 2)     Status: None   Collection Time    08/14/13  8:45 AM      Result Value Range Status   Specimen Description BLOOD RIGHT ANTECUBITAL   Final   Special Requests BOTTLES DRAWN AEROBIC AND ANAEROBIC 5CC   Final   Culture  Setup Time      Final   Value: 08/14/2013 14:05     Performed at [facility]   Culture     Final   Value: GRAM POSITIVE COCCI IN CLUSTERS     Note: Gram Stain Report Called to,Read Back By and Verified WithHolland Commons @ 1034 08/15/13 BY KRAWS     Performed at [facility]   Report Status PENDING   Incomplete  CULTURE, BLOOD (ROUTINE X 2)     Status: None   Collection Time    08/14/13  8:50 AM      Result Value Range Status   Specimen Description BLOOD LEFT ANTECUBITAL   Final   Special Requests BOTTLES DRAWN AEROBIC AND ANAEROBIC 5CC   Final   Culture  Setup Time     Final   Value: 08/14/2013 14:06     Performed at [facility]   Culture     Final   Value: GRAM POSITIVE COCCI IN CLUSTERS     1610 Note: Gram Stain Report Called to,Read Back By and Verified With: Deborra Medina 08/15/2013 FULKC     Performed at [facility]   Report Status PENDING   Incomplete  MRSA PCR SCREENING     Status: Abnormal   Collection Time    08/14/13  3:13 PM      Result Value Range Status   MRSA by PCR POSITIVE (*) NEGATIVE Final   Comment:            The GeneXpert MRSA Assay (FDA     approved for NASAL specimens     only), is one component of a     comprehensive MRSA colonization     surveillance program. It is not     intended to diagnose MRSA     infection nor to guide or     monitor treatment for     MRSA infections.     RESULT CALLED TO, READ BACK BY AND VERIFIED WITH:     FLEMING RN 17:00 08/14/13 ([email or web address])   Studies/Results: Ct Head Wo Contrast  08/14/2013   CLINICAL DATA:  Altered mental status. Hypoglycemia.  EXAM: CT HEAD WITHOUT CONTRAST  TECHNIQUE: Contiguous axial images were obtained from the base of the skull through the vertex without intravenous contrast.  COMPARISON:  MRI brain 12/22/2008. Head CT 02/29/2010.  FINDINGS: Patient was scanned in the right lateral decubitus position due to condition. Reformatted images were obtained. There is no evidence of acute  intracranial hemorrhage, mass lesion, brain edema or extra-axial fluid collection. Mild atrophy and mild chronic small vessel ischemic changes are stable. There is no evidence of cortical based infarct or hydrocephalus.  The visualized paranasal sinuses, mastoid air cells and middle ears are clear. The calvarium  is intact.  IMPRESSION: No acute intracranial findings. Stable atrophy and chronic small vessel ischemic changes.   Electronically Signed   By: Roxy Horseman M.D.   On: 08/14/2013 09:23   Dg Chest Port 1 View  08/14/2013   CLINICAL DATA:  Altered mental status.  EXAM: PORTABLE CHEST - 1 VIEW  COMPARISON:  None.  FINDINGS: No focal opacity or pulmonary edema. No effusion or pneumothorax. Cardiomegaly. Mediastinal contours distorted by rightward rotation. No acute osseous findings.  IMPRESSION: 1. No definitive acute cardiopulmonary disease. 2. Cardiomegaly. 3. When clinically able, repeat examination could evaluate the mediastinum - which is distorted by rotation.   Electronically Signed   By: Tiburcio Pea M.D.   On: 08/14/2013 05:44   Medications: I have reviewed the patient's current medications. Scheduled Meds: . aspirin  300 mg Rectal Daily  . Chlorhexidine Gluconate Cloth  6 each Topical Q0600  . heparin  5,000 Units Subcutaneous Q8H  . [START ON 08/16/2013] influenza vac split quadrivalent PF  0.5 mL Intramuscular Tomorrow-1000  . insulin aspart  0-9 Units Subcutaneous Q4H  . insulin glargine  28 Units Subcutaneous Daily  . mupirocin ointment  1 application Nasal BID  . sodium chloride  3 mL Intravenous Q12H  . vancomycin  2,000 mg Intravenous Once  . vancomycin  1,000 mg Intravenous Q12H   Continuous Infusions: . sodium chloride 100 mL/hr (08/15/13 0617)   PRN Meds:. Assessment/Plan: 72 year old man with a past medical history for diabetes, hypertension, thyroid dysfunction, who presented on 08/14/13 with altered mental status. CT head negative for acute abnormalities.  Portable chest x-ray showed infiltration. Leukocytosis with WBC 14.0 on admission w/ Temp of 100.3. Electrolytes normal with AG of 15 and HCO3 of 22. UDS negative. Urinalysis negative for UTI. Lactic acid 0.77. Blood alcohol level <11.   #: Acute encephalopathy w/ gram +ve bacteremia: Etiology is still not completely clear at this moment, however, the patient does have 2/2 +ve blood cultures from 08/14/13 for gram +ve cocci in clusters. Etiology of infection is not clear at this point.  Pneumonia is unlikely given no cough and a negative chest x-ray on admission. UTI is unlikely given negative UA. Meningitis is also less likely (he does not have headache after woke up and his repeated body temperature is normal).  -Discussed with neurology. Will hold LP and MRI at this time 2/2 bacteremia. -Started IV Vancomycin -Repeat CXR 2 view to r/o pneumonia as crackles were heard on exam. -Neuro checks every 2 hours  -SLP completed, started on regular diet. -Continue to monitor on tele  -TSH, Free T4 and T3 wnl -trop X 1 -ve -Fall precautions   #: HTN: bp is stable.  -Will continue home medications: Lotensin 40 mg daily, hydrochlorothiazide 25 mg daily   #: HLD: Patient is on Lipitor 40 mg daily. Liver functions normal  -will continue Lipitor 40 mg daily.   #: hx of Graves' disease: Diagnosed 2010. Obviously converted to hypothyroidism since patient is currently on ARMOUR at home.  -TSH, Free T4 and T3 wnl -Will continue Armour 120 mg daily for now.  #: DM-II: HbA1c 6.8. Patient was on Lantus 54 units daily at home.  -Continue at 28 units Lantus as patient has minimal oral intake at this time.  #: AG of 15: Resolved. Patient's bicarbonate is 22, does not look like to have DKA. Lactic acid is normal.  -ivf: ns 50 ml/hr -F/U bmp   # F/E/N  -NS: IV 50 cc/hr  -f/u BMP  -  NPO until swallowing test done   # DVT px: Heparin sq   Dispo: Disposition is deferred at this time, awaiting improvement of  current medical problems.  Anticipated discharge in approximately 2-3 day(s).   The patient does not have a current PCP (No primary provider on file.) and does need an [facility] hospital follow-up appointment after discharge.  The patient does not have transportation limitations that hinder transportation to clinic appointments.  .Services Needed at time of discharge: Y = Yes, Blank = No PT:   OT:   RN:   Equipment:   Other:     LOS: 1 day   Courtney Paris, MD 08/15/2013, 10:58 AM Pager: [phone]

## 2013-08-15 NOTE — Progress Notes (Addendum)
Attempted to call wife x 2 with no answer. Message left. Per radiology request for consent for LP under Fluoro and presence during am tests.  Call back rec'd from wife. Earliest she can make it to hospital today is 10 am. Her best number to reach for consent is [phone]

## 2013-08-15 NOTE — Procedures (Signed)
ELECTROENCEPHALOGRAM REPORT   Patient: [name]       Room #: 2C13 EEG No. ID: [id number] Age: 72 y.o.        Sex: male Referring Physician: Paya Report Date:  08/15/2013        Interpreting Physician: Aline Brochure  History: [name] is an 72 y.o. male Mr. diabetes mellitus who was admitted for evaluation altered mental status. He subsequently was found to have positive blood cultures for gram-positive cocci. No frank seizure activity reported.  Indications for study:  Assess severity of encephalopathy. Rule out focal seizure disorder.  Technique: This is an 18 channel routine scalp EEG performed at the bedside with bipolar and monopolar montages arranged in accordance to the international 10/20 system of electrode placement.   Description: This EEG recording was performed during wakefulness. Background activity consisted of 4-6 Hz largely rhythmic low to moderate amplitude diffuse theta activity which are superimposed on a regular low amplitude 1-2 Hz delta activity diffusely. Photic stimulation was not performed. Hyperventilation was not performed. No epileptiform discharges were recorded. There were no areas of disproportionate focal slowing.  Interpretation: This EEG is abnormal with moderately severe continuous nonspecific generalized slowing of cerebral activity, which can be seen with a wide variety of encephalopathic processes. No evidence of an epileptic disorder was demonstrated.  Venetia Maxon M.D. [facility] [phone]

## 2013-08-15 NOTE — Progress Notes (Signed)
EEG Completed; Results Pending  

## 2013-08-16 ENCOUNTER — Encounter (HOSPITAL_COMMUNITY): Payer: Self-pay | Admitting: *Deleted

## 2013-08-16 ENCOUNTER — Inpatient Hospital Stay (HOSPITAL_COMMUNITY): Payer: Medicare Other

## 2013-08-16 DIAGNOSIS — N179 Acute kidney failure, unspecified: Secondary | ICD-10-CM

## 2013-08-16 DIAGNOSIS — R7881 Bacteremia: Secondary | ICD-10-CM

## 2013-08-16 DIAGNOSIS — G9340 Encephalopathy, unspecified: Secondary | ICD-10-CM

## 2013-08-16 HISTORY — DX: Encephalopathy, unspecified: G93.40

## 2013-08-16 LAB — BASIC METABOLIC PANEL
BUN: 18 mg/dL (ref 6–23)
CO2: 23 mEq/L (ref 19–32)
CO2: 23 mEq/L (ref 19–32)
Calcium: 8.1 mg/dL — ABNORMAL LOW (ref 8.4–10.5)
Chloride: 112 mEq/L (ref 96–112)
Chloride: 107 mEq/L (ref 96–112)
Creatinine, Ser: 1.42 mg/dL — ABNORMAL HIGH (ref 0.50–1.35)
Creatinine, Ser: 1.52 mg/dL — ABNORMAL HIGH (ref 0.50–1.35)
GFR calc Af Amer: 55 mL/min — ABNORMAL LOW (ref >90)
GFR calc Af Amer: 51 mL/min — ABNORMAL LOW (ref >90)
GFR calc non Af Amer: 44 mL/min — ABNORMAL LOW (ref >90)
Potassium: 3.8 mEq/L (ref 3.5–5.1)
Potassium: 3.6 mEq/L (ref 3.5–5.1)
Sodium: 143 mEq/L (ref 135–145)

## 2013-08-16 LAB — URINALYSIS, ROUTINE W REFLEX MICROSCOPIC
Glucose, UA: >1000 mg/dL — AB
Hgb urine dipstick: NEGATIVE
Ketones, ur: 15 mg/dL — AB
Leukocytes, UA: NEGATIVE
Specific Gravity, Urine: 1.028 (ref 1.005–1.030)
pH: 5 (ref 5.0–8.0)

## 2013-08-16 LAB — URINE MICROSCOPIC-ADD ON

## 2013-08-16 LAB — CBC WITH DIFFERENTIAL/PLATELET
Basophils Relative: 0 % (ref 0–1)
Eosinophils Absolute: 0.2 10*3/uL (ref 0.0–0.7)
HCT: 33.2 % — ABNORMAL LOW (ref 39.0–52.0)
Hemoglobin: 11.4 g/dL — ABNORMAL LOW (ref 13.0–17.0)
Lymphocytes Relative: 38 % (ref 12–46)
Monocytes Absolute: 1 10*3/uL (ref 0.1–1.0)
Monocytes Relative: 11 % (ref 3–12)
Neutro Abs: 4.4 10*3/uL (ref 1.7–7.7)
Neutrophils Relative %: 49 % (ref 43–77)
RBC: 3.77 MIL/uL — ABNORMAL LOW (ref 4.22–5.81)
WBC: 9 10*3/uL (ref 4.0–10.5)

## 2013-08-16 LAB — GLUCOSE, CAPILLARY
Glucose-Capillary: 133 mg/dL — ABNORMAL HIGH (ref 70–99)
Glucose-Capillary: 158 mg/dL — ABNORMAL HIGH (ref 70–99)
Glucose-Capillary: 292 mg/dL — ABNORMAL HIGH (ref 70–99)
Glucose-Capillary: 322 mg/dL — ABNORMAL HIGH (ref 70–99)
Glucose-Capillary: 97 mg/dL (ref 70–99)

## 2013-08-16 MED ORDER — ASPIRIN EC 325 MG PO TBEC
325.0000 mg | DELAYED_RELEASE_TABLET | Freq: Every day | ORAL | Status: DC
  Administered 2013-08-16: 325 mg via ORAL
  Filled 2013-08-16: qty 1

## 2013-08-16 MED ORDER — VANCOMYCIN HCL 10 G IV SOLR
1500.0000 mg | INTRAVENOUS | Status: DC
  Administered 2013-08-17 – 2013-08-19 (×3): 1500 mg via INTRAVENOUS
  Filled 2013-08-16 (×3): qty 1500

## 2013-08-16 NOTE — Progress Notes (Signed)
ANTIBIOTIC CONSULT NOTE - FOLLOW UP  Pharmacy Consult for Vancomycin Indication: CoNS Bacteremia (pending sensitivities)  Allergies  Allergen Reactions  . Penicillins Rash    Patient Measurements: Height: 6\' 1"  (185.4 cm) Weight: 223 lb 1.7 oz (101.2 kg) IBW/kg (Calculated) : 79.9  Vital Signs: Temp: 98.3 F (36.8 C) (10/17 0800) Temp src: Oral (10/17 0800) BP: 115/89 mmHg (10/17 0800) Pulse Rate: 77 (10/17 0800) Intake/Output from previous day: 10/16 0701 - 10/17 0700 In: 683 [P.O.:480; I.V.:3; IV Piggyback:200] Out: 450 [Urine:450] Intake/Output from this shift: Total I/O In: 440 [P.O.:240; IV Piggyback:200] Out: -   Labs:  Recent Labs  08/14/13 0350 08/15/13 0420 08/16/13 0405  WBC 14.0* 10.0 9.0  HGB 13.4 11.6* 11.4*  PLT 228 185 166  CREATININE 0.95 0.98 1.42*   Estimated Creatinine Clearance: 57.9 ml/min (by C-G formula based on Cr of 1.42). No results found for this basename: VANCOTROUGH, VANCOPEAK, VANCORANDOM, GENTTROUGH, GENTPEAK, GENTRANDOM, TOBRATROUGH, TOBRAPEAK, TOBRARND, AMIKACINPEAK, AMIKACINTROU, AMIKACIN,  in the last 72 hours   Microbiology: Recent Results (from the past 720 hour(s))  CULTURE, BLOOD (ROUTINE X 2)     Status: None   Collection Time    08/14/13  8:45 AM      Result Value Range Status   Specimen Description BLOOD RIGHT ANTECUBITAL   Final   Special Requests BOTTLES DRAWN AEROBIC AND ANAEROBIC 5CC   Final   Culture  Setup Time     Final   Value: 08/14/2013 14:05     Performed at [facility]   Culture     Final   Value: STAPHYLOCOCCUS SPECIES (COAGULASE NEGATIVE)     Note: Gram Stain Report Called to,Read Back By and Verified WithHolland Commons @ 1034 08/15/13 BY KRAWS     Performed at [facility]   Report Status PENDING   Incomplete  CULTURE, BLOOD (ROUTINE X 2)     Status: None   Collection Time    08/14/13  8:50 AM      Result Value Range Status   Specimen Description BLOOD LEFT ANTECUBITAL   Final   Special Requests BOTTLES DRAWN AEROBIC AND ANAEROBIC 5CC   Final   Culture  Setup Time     Final   Value: 08/14/2013 14:06     Performed at [facility]   Culture     Final   Value: STAPHYLOCOCCUS SPECIES (COAGULASE NEGATIVE)     Note: RIFAMPIN AND GENTAMICIN SHOULD NOT BE USED AS SINGLE DRUGS FOR TREATMENT OF STAPH INFECTIONS.     0606 Note: Gram Stain Report Called to,Read Back By and Verified With: Deborra Medina 08/15/2013 [facility]     Performed at [facility]   Report Status PENDING   Incomplete  MRSA PCR SCREENING     Status: Abnormal   Collection Time    08/14/13  3:13 PM      Result Value Range Status   MRSA by PCR POSITIVE (*) NEGATIVE Final   Comment:            The GeneXpert MRSA Assay (FDA     approved for NASAL specimens     only), is one component of a     comprehensive MRSA colonization     surveillance program. It is not     intended to diagnose MRSA     infection nor to guide or     monitor treatment for     MRSA infections.     RESULT CALLED TO, READ  BACK BY AND VERIFIED WITH:     FLEMING RN 17:00 08/14/13 ([email or web address])  CULTURE, BLOOD (ROUTINE X 2)     Status: None   Collection Time    08/15/13  8:30 AM      Result Value Range Status   Specimen Description BLOOD RIGHT ARM   Final   Special Requests BOTTLES DRAWN AEROBIC AND ANAEROBIC 5CC   Final   Culture  Setup Time     Final   Value: 08/15/2013 13:58     Performed at [facility]   Culture     Final   Value:        BLOOD CULTURE RECEIVED NO GROWTH TO DATE CULTURE WILL BE HELD FOR 5 DAYS BEFORE ISSUING A FINAL NEGATIVE REPORT     Performed at [facility]   Report Status PENDING   Incomplete  CULTURE, BLOOD (ROUTINE X 2)     Status: None   Collection Time    08/15/13  8:35 AM      Result Value Range Status   Specimen Description BLOOD RIGHT HAND   Final   Special Requests BOTTLES DRAWN AEROBIC ONLY 5CC   Final   Culture  Setup Time     Final   Value: 08/15/2013 13:58      Performed at [facility]   Culture     Final   Value:        BLOOD CULTURE RECEIVED NO GROWTH TO DATE CULTURE WILL BE HELD FOR 5 DAYS BEFORE ISSUING A FINAL NEGATIVE REPORT     Performed at [facility]   Report Status PENDING   Incomplete    Anti-infectives   Start     Dose/Rate Route Frequency Ordered Stop   08/15/13 1930  vancomycin (VANCOCIN) IVPB 1000 mg/200 mL premix     1,000 mg 200 mL/hr over 60 Minutes Intravenous Every 12 hours 08/15/13 0720     08/15/13 0715  vancomycin (VANCOCIN) 2,000 mg in sodium chloride 0.9 % 500 mL IVPB     2,000 mg 250 mL/hr over 120 Minutes Intravenous  Once 08/15/13 0700 08/15/13 1158   08/15/13 0030  acyclovir (ZOVIRAX) 800 mg in dextrose 5 % 150 mL IVPB  Status:  Discontinued     800 mg 166 mL/hr over 60 Minutes Intravenous 3 times per day 08/15/13 0020 08/15/13 0905   08/14/13 0700  vancomycin (VANCOCIN) IVPB 1000 mg/200 mL premix     1,000 mg 200 mL/hr over 60 Minutes Intravenous  Once 08/14/13 0656 08/14/13 1115   08/14/13 0700  meropenem (MERREM) 1 g in sodium chloride 0.9 % 100 mL IVPB     1 g 200 mL/hr over 30 Minutes Intravenous  Once 08/14/13 0657 08/14/13 1024      Assessment: 72 y.o. M who continues on Vancomycin for 2/2 BCx positive for CoNS. The patient was loaded with Vancomycin 2g on 10/16 -- since then the patient's renal function has declined, SCr 1.42 << 0.98, CrCl~45-50 ml/min (normalized). Will reduce Vancomycin dose today given the change in renal function and will continue to monitor for any additional dose adjustments.  Vanc 10/16 >>  10/15 BCx >> 2/2 CoNS 10/16 BCx >> ngtd  Goal of Therapy:  Vancomycin trough level 15-20 mcg/ml  Plan:  1. Reduce Vancomycin to 1500 mg IV every 24 hours 2. Will continue to follow renal function, culture results, LOT, and antibiotic de-escalation plans   Georgina Pillion, PharmD, BCPS Clinical Pharmacist Pager: [phone] 08/16/2013 11:37 AM

## 2013-08-16 NOTE — Progress Notes (Signed)
Orders received to place foley catheter. When I prepared to insert foley catheter, and explained the procedure to the pt, he became very angry and yelled, "there is no way I'm letting you do that." I do not feel safe inserting a foley catheter in this man, with him being as irate as he is. I explained this to the MD, and he would like [place] to keep a very close eye on his output, whether it be via the urinal, or condom cath. Will continue to monitor, and will attempt to place condom cath if pt is cooperative.

## 2013-08-16 NOTE — Progress Notes (Signed)
Pt not willing to have condom catheter placed, but states he is willing to use urinal. Pt expresses understanding that we need to measure his urine output.

## 2013-08-16 NOTE — Progress Notes (Signed)
  Date: 08/16/2013  Patient name: [name]  Medical record number: [id number]  Date of birth: 08-14-1941   This patient has been seen and the plan of care was discussed with the house staff. Please see their note for complete details. I concur with their findings with the following additions/corrections: Appreciate neurology input.  BC x2 have been positive, but for coag negative staph, this is less concerning.  Repeat cultures have been added.  CXR with no definitive source.  The patient did cut himself accidentally with a knife weeks ago, but there is no evidence of infected wound on exam.  He is more oriented today and more interactive.  Agree with TTE.  Continue vancomycin for now.  Of note, his creatinine has increased from 0.48 on admission to 1.42 today.  His BUN has not changed much.  No recent contrast given intravenously.  No obvious nephrotoxins noted in medication list, except for Vancomycin. Obtain a level. Pharmacy monitoring. No episodes of hypotension noted. Not much UOP recorded. Recommend insert foley catheter, monitor I/O's. Obtain an ultrasound of bilateral kidneys to rule out post-renal. Obtain UA. Stop ASA for now.  If continues to rise, will need renal consult.  Start NS at 85 cc/hr for now.  [name], DO, FACP [facility] 08/16/2013, 2:14 PM

## 2013-08-16 NOTE — Progress Notes (Signed)
Pt having frequent PVC's on telemetry, occasional bigeminy PVC's have been noticeable on the monitor. Pt asymptomatic. MD notified. 2D Echo has been ordered, testing will likely occur tomorrow. Will continue to monitor.

## 2013-08-16 NOTE — Progress Notes (Signed)
Subjective: Patient had no complaints.  Objective: Current vital signs: BP 115/89  Pulse 77  Temp(Src) 98.5 F (36.9 C) (Oral)  Resp 15  Ht 6\' 1"  (1.854 m)  Wt 101.2 kg (223 lb 1.7 oz)  BMI 29.44 kg/m2  SpO2 98%  Neurologic Exam: Alert but confused. He was disoriented to place as well as time. He was able to follow commands without difficulty. Pupils and extraocular movements were normal. Visual fields were normal. No facial weakness noted. Strength of extremities was equal and normal.  Medications: I have reviewed the patient's current medications.  Assessment/Plan: Altered mental status, likely associated with current infectious process. No focal abnormalities noted clinically. Patient is alert with no complaints, including no headache or stiff neck. EEG on 08/15/2013 showed moderately severe nonspecific generalized slowing.  Recommend no changes in current management. We will continue to follow this patient with you.  C.R. Roseanne Reno, MD [facility] [phone]  08/16/2013  9:06 AM

## 2013-08-16 NOTE — Progress Notes (Signed)
Subjective: [name] is a 72 y.o. male w/ PMHx of of DM-II, hypertension, hyperlipidemia, thyroid dysfunction, who presented on 08/14/13 with altered mental status.  Patient seen at bedside this AM. Some improvement from yesterday. Able to say his address and the year today, still A&O x 1-2. Denies any pain, difficulty breathing, chest pain, fever, chills, nausea, vomiting. Still does not have an appetite.   Had some agitation overnight, given 0.5 mg Ativan by night team.   Objective: Vital signs in last 24 hours: Filed Vitals:   08/16/13 0200 08/16/13 0400 08/16/13 0500 08/16/13 0600  BP: 128/53 108/44  117/53  Pulse: 74 64  74  Temp:  98.5 F (36.9 C)    TempSrc:  Oral    Resp: 27 22  19   Height:      Weight:   223 lb 1.7 oz (101.2 kg)   SpO2: 98% 94%  98%   Weight change: -1 lb 15.7 oz (-0.9 kg)  Intake/Output Summary (Last 24 hours) at 08/16/13 0809 Last data filed at 08/15/13 1947  Gross per 24 hour  Intake    683 ml  Output    450 ml  Net    233 ml   Physical Exam: General: Alert, cooperative, and in no apparent distress HEENT: Vision grossly intact, oropharynx clear and non-erythematous  Neck: Full range of motion without pain, supple, no lymphadenopathy or carotid bruits Lungs: Air entry equal bilaterally. Mild bibasilar crackles to auscultation. No wheezes. Heart: Regular rate and rhythm, no murmurs, gallops, or rubs Abdomen: Soft, non-tender, non-distended, normal bowel sounds Extremities: No cyanosis, clubbing, or edema Neurologic: Alert & oriented x1-2, able to identify his address and the year. Seems to be answering questions more appropriately. Cranial nerves unable to be fully assessed due to mental status, however, CN VII intact, strength grossly intact, sensation generally intact to light touch.  Lab Results: Basic Metabolic Panel:  Recent Labs Lab 08/15/13 0420 08/16/13 0405  NA 141 143  K 3.6 3.8  CL 108 112  CO2 24 23  GLUCOSE 140*  103*  BUN 18 18  CREATININE 0.98 1.42*  CALCIUM 8.3* 8.0*   Liver Function Tests:  Recent Labs Lab 08/14/13 0350  AST 27  ALT 26  ALKPHOS 90  BILITOT 0.8  PROT 6.4  ALBUMIN 3.6   No results found for this basename: LIPASE, AMYLASE,  in the last 168 hours  Recent Labs Lab 08/14/13 0925  AMMONIA 23   CBC:  Recent Labs Lab 08/14/13 0350 08/15/13 0420 08/16/13 0405  WBC 14.0* 10.0 9.0  NEUTROABS 8.3*  --  4.4  HGB 13.4 11.6* 11.4*  HCT 37.4* 33.4* 33.2*  MCV 87.2 88.4 88.1  PLT 228 185 166   Cardiac Enzymes:  Recent Labs Lab 08/14/13 0350 08/14/13 1109 08/14/13 1115  CKTOTAL  --  221  --   TROPONINI <0.30  --  <0.30   CBG:  Recent Labs Lab 08/15/13 0849 08/15/13 1227 08/15/13 1647 08/15/13 2021 08/16/13 0011 08/16/13 0523  GLUCAP 138* 143* 151* 217* 133* 97   Hemoglobin A1C:  Recent Labs Lab 08/14/13 1622  HGBA1C 6.8*   Thyroid Function Tests:  Recent Labs Lab 08/14/13 1109  TSH 0.696  FREET4 1.21  T3FREE 2.2*   Coagulation:  Recent Labs Lab 08/14/13 0350  LABPROT 13.1  INR 1.01   Urine Drug Screen: Drugs of Abuse     Component Value Date/Time   LABOPIA NONE DETECTED 08/14/2013 0822   COCAINSCRNUR NONE  DETECTED 08/14/2013 0822   LABBENZ NONE DETECTED 08/14/2013 0822   AMPHETMU NONE DETECTED 08/14/2013 0822   THCU NONE DETECTED 08/14/2013 0822   LABBARB NONE DETECTED 08/14/2013 0822    Alcohol Level:  Recent Labs Lab 08/14/13 0350  ETH <11   Urinalysis:  Recent Labs Lab 08/14/13 0822  COLORURINE YELLOW  LABSPEC 1.022  PHURINE 6.0  GLUCOSEU >1000*  HGBUR NEGATIVE  BILIRUBINUR NEGATIVE  KETONESUR 40*  PROTEINUR NEGATIVE  UROBILINOGEN 1.0  NITRITE NEGATIVE  LEUKOCYTESUR NEGATIVE   Micro Results: Recent Results (from the past 240 hour(s))  CULTURE, BLOOD (ROUTINE X 2)     Status: None   Collection Time    08/14/13  8:45 AM      Result Value Range Status   Specimen Description BLOOD RIGHT ANTECUBITAL    Final   Special Requests BOTTLES DRAWN AEROBIC AND ANAEROBIC 5CC   Final   Culture  Setup Time     Final   Value: 08/14/2013 14:05     Performed at [facility]   Culture     Final   Value: GRAM POSITIVE COCCI IN CLUSTERS     Note: Gram Stain Report Called to,Read Back By and Verified WithHolland Commons @ 1034 08/15/13 BY KRAWS     Performed at [facility]   Report Status PENDING   Incomplete  CULTURE, BLOOD (ROUTINE X 2)     Status: None   Collection Time    08/14/13  8:50 AM      Result Value Range Status   Specimen Description BLOOD LEFT ANTECUBITAL   Final   Special Requests BOTTLES DRAWN AEROBIC AND ANAEROBIC 5CC   Final   Culture  Setup Time     Final   Value: 08/14/2013 14:06     Performed at [facility]   Culture     Final   Value: GRAM POSITIVE COCCI IN CLUSTERS     1610 Note: Gram Stain Report Called to,Read Back By and Verified With: Deborra Medina 08/15/2013 [facility]     Performed at [facility]   Report Status PENDING   Incomplete  MRSA PCR SCREENING     Status: Abnormal   Collection Time    08/14/13  3:13 PM      Result Value Range Status   MRSA by PCR POSITIVE (*) NEGATIVE Final   Comment:            The GeneXpert MRSA Assay (FDA     approved for NASAL specimens     only), is one component of a     comprehensive MRSA colonization     surveillance program. It is not     intended to diagnose MRSA     infection nor to guide or     monitor treatment for     MRSA infections.     RESULT CALLED TO, READ BACK BY AND VERIFIED WITH:     FLEMING RN 17:00 08/14/13 ([email or web address])  CULTURE, BLOOD (ROUTINE X 2)     Status: None   Collection Time    08/15/13  8:30 AM      Result Value Range Status   Specimen Description BLOOD RIGHT ARM   Final   Special Requests BOTTLES DRAWN AEROBIC AND ANAEROBIC 5CC   Final   Culture  Setup Time     Final   Value: 08/15/2013 13:58     Performed at [facility]   Culture     Final  Value:         BLOOD CULTURE RECEIVED NO GROWTH TO DATE CULTURE WILL BE HELD FOR 5 DAYS BEFORE ISSUING A FINAL NEGATIVE REPORT     Performed at [facility]   Report Status PENDING   Incomplete  CULTURE, BLOOD (ROUTINE X 2)     Status: None   Collection Time    08/15/13  8:35 AM      Result Value Range Status   Specimen Description BLOOD RIGHT HAND   Final   Special Requests BOTTLES DRAWN AEROBIC ONLY 5CC   Final   Culture  Setup Time     Final   Value: 08/15/2013 13:58     Performed at [facility]   Culture     Final   Value:        BLOOD CULTURE RECEIVED NO GROWTH TO DATE CULTURE WILL BE HELD FOR 5 DAYS BEFORE ISSUING A FINAL NEGATIVE REPORT     Performed at [facility]   Report Status PENDING   Incomplete   Studies/Results: Dg Chest 2 View  08/16/2013   CLINICAL DATA:  Bacteremia with ending in etiology.  EXAM: CHEST  2 VIEW  COMPARISON:  08/14/2013  FINDINGS: Lateral view is limited by patient expiration. Cardiac silhouette is enlarged. Mild left basilar opacity. No pleural effusion or pneumothorax. There are 2 compression fractures of upper to mid thoracic vertebrae.  IMPRESSION: 1. Enlarged cardiac silhouette, which may represent cardiomegaly versus pericardial effusion.  2. Mild left basilar opacity, likely atelectasis.  3. Two thoracic compression fractures, indeterminate age. Clinical correlation is recommended.   Electronically Signed   By: Jerene Dilling M.D.   On: 08/16/2013 07:56   Ct Head Wo Contrast  08/14/2013   CLINICAL DATA:  Altered mental status. Hypoglycemia.  EXAM: CT HEAD WITHOUT CONTRAST  TECHNIQUE: Contiguous axial images were obtained from the base of the skull through the vertex without intravenous contrast.  COMPARISON:  MRI brain 12/22/2008. Head CT 02/29/2010.  FINDINGS: Patient was scanned in the right lateral decubitus position due to condition. Reformatted images were obtained. There is no evidence of acute intracranial hemorrhage, mass lesion,  brain edema or extra-axial fluid collection. Mild atrophy and mild chronic small vessel ischemic changes are stable. There is no evidence of cortical based infarct or hydrocephalus.  The visualized paranasal sinuses, mastoid air cells and middle ears are clear. The calvarium is intact.  IMPRESSION: No acute intracranial findings. Stable atrophy and chronic small vessel ischemic changes.   Electronically Signed   By: Roxy Horseman M.D.   On: 08/14/2013 09:23   Medications: I have reviewed the patient's current medications. Scheduled Meds: . aspirin  300 mg Rectal Daily  . Chlorhexidine Gluconate Cloth  6 each Topical Q0600  . heparin  5,000 Units Subcutaneous Q8H  . influenza vac split quadrivalent PF  0.5 mL Intramuscular Tomorrow-1000  . insulin aspart  0-9 Units Subcutaneous Q4H  . insulin glargine  28 Units Subcutaneous Daily  . mupirocin ointment  1 application Nasal BID  . sodium chloride  3 mL Intravenous Q12H  . thyroid  120 mg Oral QAC breakfast  . vancomycin  1,000 mg Intravenous Q12H   Continuous Infusions: . sodium chloride 50 mL/hr at 08/15/13 1430   PRN Meds:. Assessment/Plan: 72 year old man with a past medical history for diabetes, hypertension, thyroid dysfunction, who presented on 08/14/13 with altered mental status. CT head negative for acute abnormalities. Portable chest x-ray showed infiltration. Leukocytosis with WBC 14.0 on  admission w/ Temp of 100.3. Electrolytes normal with AG of 15 and HCO3 of 22. UDS negative. Urinalysis negative for UTI. Lactic acid 0.77. Blood alcohol level <11.   #: Acute encephalopathy w/ gram +ve bacteremia: Etiology is still not completely clear at this moment. Patient has 2/2 +ve blood cultures from 08/14/13 for gram +ve cocci in clusters, recently found to be Coag negative. Repeat cultures from yesterday are still negative at this time. Pneumonia is unlikely given no cough and a negative chest x-ray on admission. Repeat CXR yesterday does not  suggest pneumonia. UTI is unlikely given negative UA. Meningitis is also less likely (he does not have headache after woke up and his repeated body temperature is normal). WBCs 9.0 today, decreased from 10.0. Patient has been afebrile since admission. -CXR shows enlarged cardiac silhouette (may represent cardiomegaly versus pericardial effusion), mild left basilar opacity (likely atelectasis), and two thoracic compression fractures of indeterminate age. -Discussed with neurology yesterday. Decided to hold LP and MRI 2/2 bacteremia. Still agree with current plan. Consult much appreciated. -Started IV Vancomycin on 08/15/13. Patient has had recent decline in renal function. Pharmacy has since decreased Vanco dose to 1500 mg qd. Will continue to monitor. -Move out of stepdown today -Will perform TTE today to rule out possible endocarditis. -Neuro checks every 2 hours  -Fall precautions   #AKI: Patient has baseline cr of 0.90-1.00. Today it was 1.42 and repeat BMET showed 1.52. Will workup further at this time. -Perform renal US to rule out obstruction. -Patient has refused foley placement at this time. Will try to monitor urine output as closely as possible. -Perform UA  -Stop ASA for now.  -Increased NS to 85 ml/hr -Vanco dosage decreased to 1500 mg daily by pharmacy at this time. Will closely monitor renal function.  #: HTN: bp is stable.  -Will continue home medications: Lotensin 40 mg daily, hydrochlorothiazide 25 mg daily   #: HLD: Patient is on Lipitor 40 mg daily. Liver functions normal  -will continue Lipitor 40 mg daily.   #: hx of Graves' disease: Diagnosed 2010. Obviously converted to hypothyroidism since patient is currently on ARMOUR at home.  -TSH, Free T4 and T3 wnl -Will continue Armour 120 mg daily for now.  #: DM-II: HbA1c 6.8. Patient was on Lantus 54 units daily at home.  -Continue at 28 units Lantus as patient has minimal oral intake at this time.  #: AG of 15:  Resolved. Patient's bicarbonate is 22, does not look like to have DKA. Lactic acid is normal.  -ivf: ns 50 ml/hr -F/U bmp   # DVT px: Heparin sq  Dispo: Disposition is deferred at this time, awaiting improvement of current medical problems.  Anticipated discharge in approximately 2-3 day(s).   The patient does not have a current PCP (No primary provider on file.) and does need an [facility] hospital follow-up appointment after discharge.  The patient does not have transportation limitations that hinder transportation to clinic appointments.  .Services Needed at time of discharge: Y = Yes, Blank = No PT:   OT:   RN:   Equipment:   Other:     LOS: 2 days   Courtney Paris, MD 08/16/2013, 8:09 AM Pager: [phone]

## 2013-08-16 NOTE — Progress Notes (Signed)
Nurse tech responded to bed alarm, to find pt standing on his way to the bathroom. Nurse tech Lelon Mast encouraged pt to use urinal, but pt refused, and got irate, and continued to walk to the bathroom. Pt voided into the commode, unknown amount. From this point on, will place hats in both front and back of the commode, since pt is adamant on going to the bathroom, and will try again to accurately measure urine output.

## 2013-08-16 NOTE — Evaluation (Signed)
Physical Therapy Evaluation Patient Details Name: [name] MRN: [id number] DOB: 1941-07-13 Today's Date: 08/16/2013 Time: 1478-2956 PT Time Calculation (min): 17 min  PT Assessment / Plan / Recommendation History of Present Illness  Patient is a 72 year old male with past medical history of DM-II, hypertension, hyperlipidemia, thyroid dysfunction, who presents with altered mental status  Clinical Impression  Pt is currently pleasant but confused. Pt in disbelief with education regarding city and place and demonstrates greatest limiting factor as impaired cognition and safety awareness. Pt with below deficits requiring supervision for safety with all mobility due to cognition, impaired gait and fall risk given 2 falls prior to admission and will benefit from acute therapy to maximize mobility, safety and independence to return pt to PLOF.    PT Assessment  Patient needs continued PT services    Follow Up Recommendations  No PT follow up;Supervision/Assistance - 24 hour    Does the patient have the potential to tolerate intense rehabilitation      Barriers to Discharge Decreased caregiver support      Equipment Recommendations  None recommended by PT    Recommendations for Other Services     Frequency Min 3X/week    Precautions / Restrictions Precautions Precautions: Fall   Pertinent Vitals/Pain No pain      Mobility  Bed Mobility Bed Mobility: Supine to Sit Supine to Sit: 5: Supervision;With rails;HOB flat Details for Bed Mobility Assistance: cueing to initiate Transfers Transfers: Sit to Stand;Stand to Sit Sit to Stand: 5: Supervision;From bed Stand to Sit: 5: Supervision;To chair/3-in-1 Details for Transfer Assistance: cueing for safety and hand placement Ambulation/Gait Ambulation/Gait Assistance: 4: Min guard Ambulation Distance (Feet): 40 Feet Assistive device: None Ambulation/Gait Assistance Details: pt with antalgic gait which is pt normal stride per  wife however very limited distance today due to fatigue Gait Pattern: Antalgic Gait velocity: decreased Stairs: No    Exercises     PT Diagnosis: Difficulty walking;Altered mental status  PT Problem List: Decreased activity tolerance;Decreased cognition;Decreased balance PT Treatment Interventions: Gait training;Stair training;Functional mobility training;Therapeutic activities;Patient/family education     PT Goals(Current goals can be found in the care plan section) Acute Rehab PT Goals Patient Stated Goal: return to working in the yard PT Goal Formulation: With patient/family Time For Goal Achievement: 08/23/13 Potential to Achieve Goals: Good  Visit Information  Last PT Received On: 08/16/13 Assistance Needed: +1 History of Present Illness: Patient is a 72 year old male with past medical history of DM-II, hypertension, hyperlipidemia, thyroid dysfunction, who presents with altered mental status       Prior Functioning  Home Living Family/patient expects to be discharged to:: Private residence Living Arrangements: Spouse/significant other Available Help at Discharge: Family;Available PRN/intermittently Type of Home: House Home Access: Stairs to enter Entrance Stairs-Number of Steps: 1 Home Layout: One level Home Equipment: Cane - single point;Wheelchair - manual Prior Function Level of Independence: Independent Comments: pt uses the cane at times when his ankles (previoulsy fractured) bother him Communication Communication: No difficulties    Cognition  Cognition Arousal/Alertness: Awake/alert Behavior During Therapy: WFL for tasks assessed/performed Overall Cognitive Status: Impaired/Different from baseline Area of Impairment: Orientation;Memory;Safety/judgement;Awareness Orientation Level: Place;Time;Situation Memory: Decreased short-term memory Safety/Judgement: Decreased awareness of deficits    Extremity/Trunk Assessment Upper Extremity Assessment Upper  Extremity Assessment: Overall WFL for tasks assessed Lower Extremity Assessment Lower Extremity Assessment: Overall WFL for tasks assessed Cervical / Trunk Assessment Cervical / Trunk Assessment: Normal   Balance Balance Balance Assessed: Yes Static Sitting Balance Static Sitting -  Balance Support: No upper extremity supported;Feet supported Static Sitting - Level of Assistance: 6: Modified independent (Device/Increase time) Static Sitting - Comment/# of Minutes: 2  End of Session PT - End of Session Equipment Utilized During Treatment: Gait belt Activity Tolerance: Patient tolerated treatment well Patient left: in chair;with call bell/phone within reach;with family/visitor present Nurse Communication: Mobility status  GP     Delorse Lek 08/16/2013, 2:25 PM Delaney Meigs, PT [phone]

## 2013-08-17 LAB — CULTURE, BLOOD (ROUTINE X 2)

## 2013-08-17 LAB — CBC WITH DIFFERENTIAL/PLATELET
Basophils Absolute: 0 10*3/uL (ref 0.0–0.1)
Basophils Relative: 0 % (ref 0–1)
Eosinophils Relative: 3 % (ref 0–5)
HCT: 30.6 % — ABNORMAL LOW (ref 39.0–52.0)
Hemoglobin: 10.6 g/dL — ABNORMAL LOW (ref 13.0–17.0)
Lymphocytes Relative: 39 % (ref 12–46)
MCHC: 34.6 g/dL (ref 30.0–36.0)
MCV: 87.9 fL (ref 78.0–100.0)
Monocytes Absolute: 0.7 10*3/uL (ref 0.1–1.0)
Neutro Abs: 3.2 10*3/uL (ref 1.7–7.7)
RDW: 14.5 % (ref 11.5–15.5)
WBC: 6.7 10*3/uL (ref 4.0–10.5)

## 2013-08-17 LAB — GLUCOSE, CAPILLARY
Glucose-Capillary: 137 mg/dL — ABNORMAL HIGH (ref 70–99)
Glucose-Capillary: 329 mg/dL — ABNORMAL HIGH (ref 70–99)
Glucose-Capillary: 426 mg/dL — ABNORMAL HIGH (ref 70–99)
Glucose-Capillary: 74 mg/dL (ref 70–99)

## 2013-08-17 LAB — BASIC METABOLIC PANEL
BUN: 17 mg/dL (ref 6–23)
CO2: 22 mEq/L (ref 19–32)
Calcium: 7.8 mg/dL — ABNORMAL LOW (ref 8.4–10.5)
Creatinine, Ser: 1.3 mg/dL (ref 0.50–1.35)
GFR calc Af Amer: 61 mL/min — ABNORMAL LOW (ref >90)

## 2013-08-17 MED ORDER — SODIUM CHLORIDE 0.9 % IV SOLN
INTRAVENOUS | Status: DC
  Administered 2013-08-17: 17:00:00 via INTRAVENOUS

## 2013-08-17 MED ORDER — INSULIN ASPART 100 UNIT/ML ~~LOC~~ SOLN
0.0000 [IU] | Freq: Three times a day (TID) | SUBCUTANEOUS | Status: DC
  Administered 2013-08-17 – 2013-08-18 (×2): 11 [IU] via SUBCUTANEOUS
  Administered 2013-08-18: 15 [IU] via SUBCUTANEOUS
  Administered 2013-08-18: 5 [IU] via SUBCUTANEOUS
  Administered 2013-08-19: 15 [IU] via SUBCUTANEOUS
  Administered 2013-08-19: 8 [IU] via SUBCUTANEOUS

## 2013-08-17 NOTE — Progress Notes (Signed)
Inpatient Diabetes Program Recommendations  AACE/ADA: New Consensus Statement on Inpatient Glycemic Control (2013)  Target Ranges:  Prepandial:   less than 140 mg/dL      Peak postprandial:   less than 180 mg/dL (1-2 hours)      Critically ill patients:  140 - 180 mg/dL     Results for [name], [name] (MRN [id number]) as of 08/17/2013 23:13  Ref. Range 08/16/2013 00:11 08/16/2013 05:23 08/16/2013 07:47 08/16/2013 11:53 08/16/2013 17:16 08/16/2013 20:22  Glucose-Capillary Latest Range: 70-99 mg/dL 409 (H) 97 811 (H) 914 (H) 322 (H) 199 (H)    Results for [name], [name] (MRN [id number]) as of 08/17/2013 23:13  Ref. Range 08/17/2013 00:38 08/17/2013 04:12 08/17/2013 04:33 08/17/2013 05:06 08/17/2013 07:53 08/17/2013 11:03  Glucose-Capillary Latest Range: 70-99 mg/dL 74 46 (L) 59 (L) 086 (H) 329 (H) 426 (H)    **Patient with hypoglycemia this morning (CBG 46 mg/dl).  Question if this hypoglycemia may have been due to the Novolog patient received late yesterday afternoon and at bedtime.  Per records, patient received 7 units at 5pm for CBG of 322 mg/dl, and then it is documented that patient received another 4 units of Novolog at 2234 last evening at bedtime.  Noted patient then had rebound hyperglycemia this morning with CBG of 329 mg/dl.  Of note, patient received two 15 gram carbohydrate snacks this morning for his hypoglycemia.   **Spoke with Dr. Virgina Organ by phone this afternoon.  Recommended d/c of Q4 hour Novolog SSI coverage and switch to Novolog Moderate correction scale (SSI) tid with meals only.  If additional Lantus is desired, would only increase by a conservative amount (20% at most for now).  Noted patient is now on solid carbohydrate modified diet.  If patient continues to have severely elevated CBGs after meals, recommend addition of Novolog meal coverage- 4 units tid with meals to start.   Will follow. Ambrose Finland RN, MSN, CDE Diabetes Coordinator Inpatient  Diabetes Program Team Pager: [phone] (8a-10p)

## 2013-08-17 NOTE — Progress Notes (Signed)
Vancomycin per Rx   Infectious Disease: Vancomycin for 2/2 CoNS in BCx. Unclear source? Loaded with Vanc 2g on 10/16 then started on 1g/12h. SCr 1.3 << 1.42 << 0.98, CrCl~45-50 ml/min (normalized) -- reduced Vancomycin dose for changing renal fxn on 10/17  Vanc 10/16 >>  10/15 BCx >> 2/2 CoNS sensitive to many things except erythro and PCN 10/16 BCx >> ngtd  Nephrology: SCr 1.3 << 1.42 << 0.98, CrCl~45-50 ml/min (normalized), UOP 0.8 ml/kg/hr [name] far  Plan - Cont Vancomycin to 1500 mg IV every 24 hours for now. - Check SCr tomorrow again to make sure SCr is stablized.  If [name], will need to adjust dose again - F/u work-up of CoNS bacteremia

## 2013-08-17 NOTE — Progress Notes (Signed)
Subjective: [name] was seen and examined at bedside this morning. He appears more oriented today and better able to answer questions. His wife was not present in the room and he did not know where she was.  He did correctly identify himself, the hospital, his birthday, and the present.  He does not know why he has been in the hospital or the events leading to admission. He denies chest pain, headache, SOB, fever, chills, or abdominal pain.   Objective: Vital signs in last 24 hours: Filed Vitals:   08/16/13 1242 08/16/13 1607 08/16/13 2027 08/17/13 1201  BP: 150/69 104/52 115/69 128/59  Pulse: 79 62 57 59  Temp: 98.4 F (36.9 C) 98.5 F (36.9 C) 98 F (36.7 C) 98.9 F (37.2 C)  TempSrc:  Oral Oral Oral  Resp: 18 18 18 18   Height:   6\' 1"  (1.854 m)   Weight:   224 lb 1.6 oz (101.651 kg)   SpO2: 99% 97% 94% 93%   Weight change: 15.9 oz (0.451 kg)  Intake/Output Summary (Last 24 hours) at 08/17/13 1305 Last data filed at 08/17/13 0947  Gross per 24 hour  Intake 3310.25 ml  Output    600 ml  Net 2710.25 ml   Physical Exam: General: lying in bed with glasses on, NAD HEENT: Vision intact, EOMI Lungs: CTA B/L Heart: RRR Abdomen: Soft, obese, distended, non-tender, normal bowel sounds Extremities: No cyanosis, clubbing, or edema Neurologic: Alert, awake & oriented to person, place and time. Able to correctly identify the hospital and his DOB.  Strength and sensation grossly intact.    Lab Results: Basic Metabolic Panel:  Recent Labs Lab 08/16/13 1150 08/17/13 0625  NA 140 142  K 3.6 3.5  CL 107 110  CO2 23 22  GLUCOSE 304* 256*  BUN 20 17  CREATININE 1.52* 1.30  CALCIUM 8.1* 7.8*   Liver Function Tests:  Recent Labs Lab 08/14/13 0350  AST 27  ALT 26  ALKPHOS 90  BILITOT 0.8  PROT 6.4  ALBUMIN 3.6    Recent Labs Lab 08/14/13 0925  AMMONIA 23   CBC:  Recent Labs Lab 08/16/13 0405 08/17/13 0625  WBC 9.0 6.7  NEUTROABS 4.4 3.2  HGB 11.4*  10.6*  HCT 33.2* 30.6*  MCV 88.1 87.9  PLT 166 151   Cardiac Enzymes:  Recent Labs Lab 08/14/13 0350 08/14/13 1109 08/14/13 1115  CKTOTAL  --  221  --   TROPONINI <0.30  --  <0.30   CBG:  Recent Labs Lab 08/16/13 2022 08/17/13 0038 08/17/13 0412 08/17/13 0433 08/17/13 0506 08/17/13 0753  GLUCAP 199* 74 46* 59* 137* 329*   Hemoglobin A1C:  Recent Labs Lab 08/14/13 1622  HGBA1C 6.8*   Thyroid Function Tests:  Recent Labs Lab 08/14/13 1109  TSH 0.696  FREET4 1.21  T3FREE 2.2*   Coagulation:  Recent Labs Lab 08/14/13 0350  LABPROT 13.1  INR 1.01   Urine Drug Screen: Drugs of Abuse     Component Value Date/Time   LABOPIA NONE DETECTED 08/14/2013 0822   COCAINSCRNUR NONE DETECTED 08/14/2013 0822   LABBENZ NONE DETECTED 08/14/2013 0822   AMPHETMU NONE DETECTED 08/14/2013 0822   THCU NONE DETECTED 08/14/2013 0822   LABBARB NONE DETECTED 08/14/2013 0822    Alcohol Level:  Recent Labs Lab 08/14/13 0350  ETH <11   Urinalysis:  Recent Labs Lab 08/14/13 0822 08/16/13 2230  COLORURINE YELLOW YELLOW  LABSPEC 1.022 1.028  PHURINE 6.0 5.0  GLUCOSEU >1000* >1000*  HGBUR NEGATIVE NEGATIVE  BILIRUBINUR NEGATIVE NEGATIVE  KETONESUR 40* 15*  PROTEINUR NEGATIVE NEGATIVE  UROBILINOGEN 1.0 0.2  NITRITE NEGATIVE NEGATIVE  LEUKOCYTESUR NEGATIVE NEGATIVE   Micro Results: Recent Results (from the past 240 hour(s))  CULTURE, BLOOD (ROUTINE X 2)     Status: None   Collection Time    08/14/13  8:45 AM      Result Value Range Status   Specimen Description BLOOD RIGHT ANTECUBITAL   Final   Special Requests BOTTLES DRAWN AEROBIC AND ANAEROBIC 5CC   Final   Culture  Setup Time     Final   Value: 08/14/2013 14:05     Performed at [facility]   Culture     Final   Value: STAPHYLOCOCCUS SPECIES (COAGULASE NEGATIVE)     Note: SUSCEPTIBILITIES PERFORMED ON PREVIOUS CULTURE WITHIN THE LAST 5 DAYS.     Note: Gram Stain Report Called to,Read Back By  and Verified With: Holland Commons @ 1034 08/15/13 BY KRAWS     Performed at [facility]   Report Status 08/17/2013 FINAL   Final  CULTURE, BLOOD (ROUTINE X 2)     Status: None   Collection Time    08/14/13  8:50 AM      Result Value Range Status   Specimen Description BLOOD LEFT ANTECUBITAL   Final   Special Requests BOTTLES DRAWN AEROBIC AND ANAEROBIC 5CC   Final   Culture  Setup Time     Final   Value: 08/14/2013 14:06     Performed at [facility]   Culture     Final   Value: STAPHYLOCOCCUS SPECIES (COAGULASE NEGATIVE)     Note: RIFAMPIN AND GENTAMICIN SHOULD NOT BE USED AS SINGLE DRUGS FOR TREATMENT OF STAPH INFECTIONS. Two isolates with different morphologies were identified as the same organism.The most resistant organism was reported.     0606 Note: Gram Stain Report Called to,Read Back By and Verified With: Deborra Medina 08/15/2013 [facility]     Performed at [facility]   Report Status 08/17/2013 FINAL   Final   Organism ID, Bacteria STAPHYLOCOCCUS SPECIES (COAGULASE NEGATIVE)   Final  MRSA PCR SCREENING     Status: Abnormal   Collection Time    08/14/13  3:13 PM      Result Value Range Status   MRSA by PCR POSITIVE (*) NEGATIVE Final   Comment:            The GeneXpert MRSA Assay (FDA     approved for NASAL specimens     only), is one component of a     comprehensive MRSA colonization     surveillance program. It is not     intended to diagnose MRSA     infection nor to guide or     monitor treatment for     MRSA infections.     RESULT CALLED TO, READ BACK BY AND VERIFIED WITH:     FLEMING RN 17:00 08/14/13 ([email or web address])  CULTURE, BLOOD (ROUTINE X 2)     Status: None   Collection Time    08/15/13  8:30 AM      Result Value Range Status   Specimen Description BLOOD RIGHT ARM   Final   Special Requests BOTTLES DRAWN AEROBIC AND ANAEROBIC 5CC   Final   Culture  Setup Time     Final   Value: 08/15/2013 13:58     Performed at [facility]  Final   Value:        BLOOD CULTURE RECEIVED NO GROWTH TO DATE CULTURE WILL BE HELD FOR 5 DAYS BEFORE ISSUING A FINAL NEGATIVE REPORT     Performed at [facility]   Report Status PENDING   Incomplete  CULTURE, BLOOD (ROUTINE X 2)     Status: None   Collection Time    08/15/13  8:35 AM      Result Value Range Status   Specimen Description BLOOD RIGHT HAND   Final   Special Requests BOTTLES DRAWN AEROBIC ONLY 5CC   Final   Culture  Setup Time     Final   Value: 08/15/2013 13:58     Performed at [facility]   Culture     Final   Value:        BLOOD CULTURE RECEIVED NO GROWTH TO DATE CULTURE WILL BE HELD FOR 5 DAYS BEFORE ISSUING A FINAL NEGATIVE REPORT     Performed at [facility]   Report Status PENDING   Incomplete   Studies/Results: Dg Chest 2 View  08/16/2013   CLINICAL DATA:  Bacteremia with ending in etiology.  EXAM: CHEST  2 VIEW  COMPARISON:  08/14/2013  FINDINGS: Lateral view is limited by patient expiration. Cardiac silhouette is enlarged. Mild left basilar opacity. No pleural effusion or pneumothorax. There are 2 compression fractures of upper to mid thoracic vertebrae.  IMPRESSION: 1. Enlarged cardiac silhouette, which may represent cardiomegaly versus pericardial effusion.  2. Mild left basilar opacity, likely atelectasis.  3. Two thoracic compression fractures, indeterminate age. Clinical correlation is recommended.   Electronically Signed   By: Jerene Dilling M.D.   On: 08/16/2013 07:56   US Renal  08/16/2013   CLINICAL DATA:  Elevated creatinine  EXAM: RENAL/URINARY TRACT ULTRASOUND COMPLETE  COMPARISON:  None.  FINDINGS: Right Kidney  Length: 11.8 cm in length. Echogenicity within normal limits. No mass or hydronephrosis visualized.  Left Kidney  Length: 13.1 cm in length. Echogenicity within normal limits. No mass or hydronephrosis visualized.  Bladder  Within normal limits. Bladder volume is 107 cc.  IMPRESSION: No evidence of  hydronephrosis. No acute pathology.   Electronically Signed   By: Maryclare Bean M.D.   On: 08/16/2013 20:48   Medications: I have reviewed the patient's current medications. Scheduled Meds: . Chlorhexidine Gluconate Cloth  6 each Topical Q0600  . heparin  5,000 Units Subcutaneous Q8H  . insulin aspart  0-9 Units Subcutaneous Q4H  . insulin glargine  28 Units Subcutaneous Daily  . mupirocin ointment  1 application Nasal BID  . sodium chloride  3 mL Intravenous Q12H  . thyroid  120 mg Oral QAC breakfast  . vancomycin  1,500 mg Intravenous Q24H   Continuous Infusions: . sodium chloride 85 mL/hr at 08/17/13 0730   Assessment/Plan: 72 year old man with a past medical history for diabetes, hypertension, thyroid dysfunction, who presented on 08/14/13 with altered mental status. CT head negative for acute abnormalities. Portable chest x-ray showed infiltration. Leukocytosis with WBC 14.0 on admission w/ Temp of 100.3. Electrolytes normal with AG of 15 and HCO3 of 22. UDS negative. Urinalysis negative for UTI. Lactic acid 0.77. Blood alcohol level <11.   #: Acute encephalopathy w/ gram +ve bacteremia: Etiology unclear, but seems to be improving slightly. Patient has 2/2 +ve blood cultures from 08/14/13 for PCN resistant coag negative staph.  Repeat cultures from 08/15/13 are still NGTD. Pneumonia is unlikely given no cough, now afebrile, no leukocytosis,  and a negative chest x-ray along with repeat cxr not suggestive of PNA. UTI is unlikely given negative UA. Meningitis is also less likely (no headache and his repeated body temperature is normal).   -Appreciate neurology following -Started IV Vancomycin on 08/15/13, dose per pharmacy given renal function -Echo pending, rule out possible endocarditis. -Neuro checks every 2 hours  -Fall precautions   #: AKI: Improving. Patient has baseline cr of 0.90-1.00. Cr down to 1.3 today from 1.52 yesterday. Unclear etiology. Renal ultrasound with no evidence of  hydronephrosis.  Possibly secondary to vancomycin?  Recent Labs Lab 08/14/13 0350 08/15/13 0420 08/16/13 0405 08/16/13 1150 08/17/13 0625  CREATININE 0.95 0.98 1.42* 1.52* 1.30   -Patient has refused foley placement. Will try to monitor urine output as closely as possible. Total output documented as 450 but likely not accurate as patient has not been using urinal and initially no hats on commode. U/A: negative for nitrite or leukocytes.  -Stop ASA for now.  -Increased NS to 85 ml/hr yesterday -Vanco dosage decreased for renal function per pharmacy -continue to trend renal function  #: HTN -continue home medications: Lotensin 40 mg daily, hydrochlorothiazide 25 mg daily   #: HLD: Patient is on Lipitor 40 mg daily. Liver functions normal  -will continue Lipitor 40 mg daily.   #: hx of Graves' disease: Diagnosed 2010. Obviously converted to hypothyroidism since patient is currently on ARMOUR at home.  -TSH 0.696, Free T4 1.21 and T3 2.2 -Will continue Armour 120 mg daily for now.  #: DM-II: HbA1c 6.8. Patient was on Lantus 54 units daily at home. Hypoglycemia noted this morning down to 46 around 4am, but hyperglycemia mid afternoon ~400's.  -Continue at 28 units Lantus as patient has minimal oral intake at this time -SSI moderate TID dosing   # Diet: Carb modified # DVT px: Heparin sq Dispo: Disposition is deferred at this time, awaiting improvement of current medical problems.  Anticipated discharge in approximately 2-3 day(s).   The patient does not have a current PCP (No primary provider on file.) and does need an [facility] hospital follow-up appointment after discharge.  The patient does not have transportation limitations that hinder transportation to clinic appointments.  Services Needed at time of discharge: Y = Yes, Blank = No PT:   OT:   RN:   Equipment:   Other:     LOS: 3 days   Darden Palmer, MD 08/17/2013, 1:05 PM Pager: [phone]

## 2013-08-17 NOTE — Progress Notes (Signed)
08/17/13 1311 nsg MD notified of cbg [phone]

## 2013-08-17 NOTE — Progress Notes (Signed)
Hypoglycemic Event  CBG: 47  Treatment: 15 GM carbohydrate snack  Symptoms: None  Follow-up CBG: Time:0435 CBG Result:59  Possible Reasons for Event: Inadequate meal intake  Comments/MD notified: MD on call was notified. CBG was rechecked after another snack was given and was 136. MD on call was notified. No new orders were given.    Leim Fabry, Ritta Hammes  Remember to initiate Hypoglycemia Order Set & complete

## 2013-08-18 DIAGNOSIS — I517 Cardiomegaly: Secondary | ICD-10-CM

## 2013-08-18 LAB — CREATININE, SERUM
Creatinine, Ser: 1.32 mg/dL (ref 0.50–1.35)
GFR calc Af Amer: 60 mL/min — ABNORMAL LOW (ref >90)

## 2013-08-18 LAB — GLUCOSE, CAPILLARY
Glucose-Capillary: 218 mg/dL — ABNORMAL HIGH (ref 70–99)
Glucose-Capillary: 192 mg/dL — ABNORMAL HIGH (ref 70–99)
Glucose-Capillary: 357 mg/dL — ABNORMAL HIGH (ref 70–99)
Glucose-Capillary: 357 mg/dL — ABNORMAL HIGH (ref 70–99)
Glucose-Capillary: 308 mg/dL — ABNORMAL HIGH (ref 70–99)
Glucose-Capillary: 295 mg/dL — ABNORMAL HIGH (ref 70–99)

## 2013-08-18 MED ORDER — INSULIN GLARGINE 100 UNIT/ML ~~LOC~~ SOLN
32.0000 [IU] | Freq: Every day | SUBCUTANEOUS | Status: DC
  Administered 2013-08-19 – 2013-08-20 (×2): 32 [IU] via SUBCUTANEOUS
  Filled 2013-08-18 (×2): qty 0.32

## 2013-08-18 NOTE — Progress Notes (Signed)
Echocardiogram 2D Echocardiogram has been performed.  Estelle Grumbles 08/18/2013, 1:31 PM

## 2013-08-18 NOTE — Progress Notes (Signed)
Subjective: [name] is a 72 y.o. male w/ PMHx of of DM-II, hypertension, hyperlipidemia, thyroid dysfunction, who presented on 08/14/13 with altered mental status.  Patient seen at bedside this AM. Mental status much improved. Patient A&O x 3 now. Able to identify his address, wife's name, hospital, month, and year. Denies any complaints. No fever, chills, nausea, vomiting, chest pain, SOB, lightheadedness, dizziness, or confusion.   Objective: Vital signs in last 24 hours: Filed Vitals:   08/17/13 1201 08/17/13 1757 08/17/13 2024 08/18/13 0417  BP: 128/59 127/54 105/62 128/62  Pulse: 59 70 55 62  Temp: 98.9 F (37.2 C) 98.6 F (37 C) 99 F (37.2 C) 98.6 F (37 C)  TempSrc: Oral Oral Oral Oral  Resp: 18 18 18 18   Height:      Weight:   224 lb 6.9 oz (101.8 kg)   SpO2: 93% 97% 95% 95%   Weight change: 5.3 oz (0.149 kg)  Intake/Output Summary (Last 24 hours) at 08/18/13 0708 Last data filed at 08/18/13 0300  Gross per 24 hour  Intake 2527.5 ml  Output   1050 ml  Net 1477.5 ml   Physical Exam: General: Alert, cooperative, and in no apparent distress HEENT: Vision grossly intact, oropharynx clear and non-erythematous  Neck: Full range of motion without pain, supple, no lymphadenopathy or carotid bruits Lungs: Air entry equal bilaterally. No wheezes, crackles, rhonchi. Heart: Regular rate and rhythm, no murmurs, gallops, or rubs Abdomen: Soft, non-tender, non-distended, normal bowel sounds Extremities: No cyanosis, clubbing, or edema Neurologic: A&O x 3. Much more oriented than before, close to baseline. CN's intact. Strength and sensation intact bilaterally.   Lab Results: Basic Metabolic Panel:  Recent Labs Lab 08/16/13 1150 08/17/13 0625 08/18/13 0415  NA 140 142  --   K 3.6 3.5  --   CL 107 110  --   CO2 23 22  --   GLUCOSE 304* 256*  --   BUN 20 17  --   CREATININE 1.52* 1.30 1.32  CALCIUM 8.1* 7.8*  --    Liver Function Tests:  Recent  Labs Lab 08/14/13 0350  AST 27  ALT 26  ALKPHOS 90  BILITOT 0.8  PROT 6.4  ALBUMIN 3.6   No results found for this basename: LIPASE, AMYLASE,  in the last 168 hours  Recent Labs Lab 08/14/13 0925  AMMONIA 23   CBC:  Recent Labs Lab 08/16/13 0405 08/17/13 0625  WBC 9.0 6.7  NEUTROABS 4.4 3.2  HGB 11.4* 10.6*  HCT 33.2* 30.6*  MCV 88.1 87.9  PLT 166 151   Cardiac Enzymes:  Recent Labs Lab 08/14/13 0350 08/14/13 1109 08/14/13 1115  CKTOTAL  --  221  --   TROPONINI <0.30  --  <0.30   CBG:  Recent Labs Lab 08/17/13 0506 08/17/13 0753 08/17/13 1103 08/17/13 2021 08/17/13 2355 08/18/13 0416  GLUCAP 137* 329* 426* 218* 220* 192*   Hemoglobin A1C:  Recent Labs Lab 08/14/13 1622  HGBA1C 6.8*   Thyroid Function Tests:  Recent Labs Lab 08/14/13 1109  TSH 0.696  FREET4 1.21  T3FREE 2.2*   Coagulation:  Recent Labs Lab 08/14/13 0350  LABPROT 13.1  INR 1.01   Urine Drug Screen: Drugs of Abuse     Component Value Date/Time   LABOPIA NONE DETECTED 08/14/2013 0822   COCAINSCRNUR NONE DETECTED 08/14/2013 0822   LABBENZ NONE DETECTED 08/14/2013 0822   AMPHETMU NONE DETECTED 08/14/2013 0822   THCU NONE DETECTED 08/14/2013 7846  LABBARB NONE DETECTED 08/14/2013 0822    Alcohol Level:  Recent Labs Lab 08/14/13 0350  ETH <11   Urinalysis:  Recent Labs Lab 08/14/13 0822 08/16/13 2230  COLORURINE YELLOW YELLOW  LABSPEC 1.022 1.028  PHURINE 6.0 5.0  GLUCOSEU >1000* >1000*  HGBUR NEGATIVE NEGATIVE  BILIRUBINUR NEGATIVE NEGATIVE  KETONESUR 40* 15*  PROTEINUR NEGATIVE NEGATIVE  UROBILINOGEN 1.0 0.2  NITRITE NEGATIVE NEGATIVE  LEUKOCYTESUR NEGATIVE NEGATIVE   Micro Results: Recent Results (from the past 240 hour(s))  CULTURE, BLOOD (ROUTINE X 2)     Status: None   Collection Time    08/14/13  8:45 AM      Result Value Range Status   Specimen Description BLOOD RIGHT ANTECUBITAL   Final   Special Requests BOTTLES DRAWN AEROBIC AND  ANAEROBIC 5CC   Final   Culture  Setup Time     Final   Value: 08/14/2013 14:05     Performed at [facility]   Culture     Final   Value: STAPHYLOCOCCUS SPECIES (COAGULASE NEGATIVE)     Note: SUSCEPTIBILITIES PERFORMED ON PREVIOUS CULTURE WITHIN THE LAST 5 DAYS.     Note: Gram Stain Report Called to,Read Back By and Verified With: Holland Commons @ 1034 08/15/13 BY KRAWS     Performed at [facility]   Report Status 08/17/2013 FINAL   Final  CULTURE, BLOOD (ROUTINE X 2)     Status: None   Collection Time    08/14/13  8:50 AM      Result Value Range Status   Specimen Description BLOOD LEFT ANTECUBITAL   Final   Special Requests BOTTLES DRAWN AEROBIC AND ANAEROBIC 5CC   Final   Culture  Setup Time     Final   Value: 08/14/2013 14:06     Performed at [facility]   Culture     Final   Value: STAPHYLOCOCCUS SPECIES (COAGULASE NEGATIVE)     Note: RIFAMPIN AND GENTAMICIN SHOULD NOT BE USED AS SINGLE DRUGS FOR TREATMENT OF STAPH INFECTIONS. Two isolates with different morphologies were identified as the same organism.The most resistant organism was reported.     0606 Note: Gram Stain Report Called to,Read Back By and Verified With: Deborra Medina 08/15/2013 [facility]     Performed at [facility]   Report Status 08/17/2013 FINAL   Final   Organism ID, Bacteria STAPHYLOCOCCUS SPECIES (COAGULASE NEGATIVE)   Final  MRSA PCR SCREENING     Status: Abnormal   Collection Time    08/14/13  3:13 PM      Result Value Range Status   MRSA by PCR POSITIVE (*) NEGATIVE Final   Comment:            The GeneXpert MRSA Assay (FDA     approved for NASAL specimens     only), is one component of a     comprehensive MRSA colonization     surveillance program. It is not     intended to diagnose MRSA     infection nor to guide or     monitor treatment for     MRSA infections.     RESULT CALLED TO, READ BACK BY AND VERIFIED WITH:     FLEMING RN 17:00 08/14/13 ([email or web address])  CULTURE,  BLOOD (ROUTINE X 2)     Status: None   Collection Time    08/15/13  8:30 AM      Result Value Range Status   Specimen Description BLOOD RIGHT  ARM   Final   Special Requests BOTTLES DRAWN AEROBIC AND ANAEROBIC 5CC   Final   Culture  Setup Time     Final   Value: 08/15/2013 13:58     Performed at [facility]   Culture     Final   Value:        BLOOD CULTURE RECEIVED NO GROWTH TO DATE CULTURE WILL BE HELD FOR 5 DAYS BEFORE ISSUING A FINAL NEGATIVE REPORT     Performed at [facility]   Report Status PENDING   Incomplete  CULTURE, BLOOD (ROUTINE X 2)     Status: None   Collection Time    08/15/13  8:35 AM      Result Value Range Status   Specimen Description BLOOD RIGHT HAND   Final   Special Requests BOTTLES DRAWN AEROBIC ONLY 5CC   Final   Culture  Setup Time     Final   Value: 08/15/2013 13:58     Performed at [facility]   Culture     Final   Value:        BLOOD CULTURE RECEIVED NO GROWTH TO DATE CULTURE WILL BE HELD FOR 5 DAYS BEFORE ISSUING A FINAL NEGATIVE REPORT     Performed at [facility]   Report Status PENDING   Incomplete   Studies/Results: US Renal  08/16/2013   CLINICAL DATA:  Elevated creatinine  EXAM: RENAL/URINARY TRACT ULTRASOUND COMPLETE  COMPARISON:  None.  FINDINGS: Right Kidney  Length: 11.8 cm in length. Echogenicity within normal limits. No mass or hydronephrosis visualized.  Left Kidney  Length: 13.1 cm in length. Echogenicity within normal limits. No mass or hydronephrosis visualized.  Bladder  Within normal limits. Bladder volume is 107 cc.  IMPRESSION: No evidence of hydronephrosis. No acute pathology.   Electronically Signed   By: Maryclare Bean M.D.   On: 08/16/2013 20:48   Medications: I have reviewed the patient's current medications. Scheduled Meds: . Chlorhexidine Gluconate Cloth  6 each Topical Q0600  . heparin  5,000 Units Subcutaneous Q8H  . insulin aspart  0-15 Units Subcutaneous TID WC  . insulin glargine  28 Units  Subcutaneous Daily  . mupirocin ointment  1 application Nasal BID  . sodium chloride  3 mL Intravenous Q12H  . thyroid  120 mg Oral QAC breakfast  . vancomycin  1,500 mg Intravenous Q24H   Continuous Infusions: . sodium chloride 50 mL/hr at 08/17/13 1709   PRN Meds:. Assessment/Plan: 72 year old man with a past medical history for diabetes, hypertension, thyroid dysfunction, who presented on 08/14/13 with altered mental status. CT head negative for acute abnormalities. Portable chest x-ray showed infiltration. Leukocytosis with WBC 14.0 on admission w/ Temp of 100.3. Electrolytes normal with AG of 15 and HCO3 of 22. UDS negative. Urinalysis negative for UTI. Lactic acid 0.77. Blood alcohol level <11.   #: Acute encephalopathy w/ gram +ve bacteremia: Etiology is still not completely clear at this moment. Patient had 2/2 +ve blood cultures from 08/14/13 for gram +ve cocci in clusters, found to be Coag negative. Repeat cultures still negative at this time. Pneumonia is unlikely given no cough and a negative chest x-ray on admission. Repeat CXR yesterday does not suggest pneumonia. UTI is unlikely given negative UA x 2. Meningitis is also less likely (he does not have headache after woke up and his repeated body temperature is normal). WBCs 6.7 yesterday, decreased from 9.0 the day before. Patient has been afebrile since admission. -  Continue IV Vancomycin. Pharmacy has decreased Vanco dose to 1500 mg qd 2/2 recent AKI. Will continue to monitor. -Repeat blood cultures today, from different sites. -Will perform TTE today to rule out possible endocarditis. -Neuro checks every 2 hours  -Fall precautions   #AKI: Patient has baseline cr of 0.90-1.00. Today it was 1.32. See trend as follows:  0.98 --> 1.42 --> 1.52 --> 1.30 --> 1.32. -Renal US showed no obstruction -UA shows no abnormalities. -Continue to hold ASA. -Continue NS @ 50 ml/hr -Vanco dosage decreased to 1500 mg daily by pharmacy at this  time. Will continue to closely monitor renal function.  #: HTN: bp is stable.  -Will continue home medications: Lotensin 40 mg daily, hydrochlorothiazide 25 mg daily   #: HLD: Patient is on Lipitor 40 mg daily. Liver functions normal  -will continue Lipitor 40 mg daily.   #: hx of Graves' disease: Diagnosed 2010. Obviously converted to hypothyroidism since patient is currently on ARMOUR at home.  -TSH, Free T4 and T3 wnl -Will continue Armour 120 mg daily for now.  #: DM-II: HbA1c 6.8. Patient was on Lantus 54 units daily at home. Sugars have been labile over the last 2 days. Switched to moderate ISS coverage w/ meals only. -Continue at 28 units Lantus for now. Will increase by 20% if still not well controlled. -Appreciate input from diabetes coordinator.  # DVT px: Heparin sq  Dispo: Disposition is deferred at this time, awaiting improvement of current medical problems.  Anticipated discharge in approximately 2-3 day(s).   The patient does not have a current PCP (No primary provider on file.) and does need an [facility] hospital follow-up appointment after discharge.  The patient does not have transportation limitations that hinder transportation to clinic appointments.  .Services Needed at time of discharge: Y = Yes, Blank = No PT:   OT:   RN:   Equipment:   Other:     LOS: 4 days   Courtney Paris, MD 08/18/2013, 7:08 AM Pager: [phone]

## 2013-08-19 LAB — BASIC METABOLIC PANEL
Chloride: 109 mEq/L (ref 96–112)
Creatinine, Ser: 1.31 mg/dL (ref 0.50–1.35)
GFR calc Af Amer: 61 mL/min — ABNORMAL LOW (ref >90)
GFR calc non Af Amer: 52 mL/min — ABNORMAL LOW (ref >90)
Potassium: 4.1 mEq/L (ref 3.5–5.1)
Sodium: 141 mEq/L (ref 135–145)

## 2013-08-19 LAB — GLUCOSE, CAPILLARY
Glucose-Capillary: 300 mg/dL — ABNORMAL HIGH (ref 70–99)
Glucose-Capillary: 308 mg/dL — ABNORMAL HIGH (ref 70–99)
Glucose-Capillary: 295 mg/dL — ABNORMAL HIGH (ref 70–99)
Glucose-Capillary: 333 mg/dL — ABNORMAL HIGH (ref 70–99)

## 2013-08-19 LAB — URINALYSIS, ROUTINE W REFLEX MICROSCOPIC
Glucose, UA: >1000 mg/dL — AB
Hgb urine dipstick: NEGATIVE
Ketones, ur: NEGATIVE mg/dL
Protein, ur: NEGATIVE mg/dL
Urobilinogen, UA: 0.2 mg/dL (ref 0.0–1.0)
pH: 5 (ref 5.0–8.0)

## 2013-08-19 LAB — URINE MICROSCOPIC-ADD ON

## 2013-08-19 MED ORDER — INSULIN ASPART 100 UNIT/ML ~~LOC~~ SOLN
6.0000 [IU] | Freq: Three times a day (TID) | SUBCUTANEOUS | Status: DC
  Administered 2013-08-19 – 2013-08-20 (×3): 6 [IU] via SUBCUTANEOUS

## 2013-08-19 NOTE — Progress Notes (Signed)
  Date: 08/19/2013  Patient name: [name]  Medical record number: [id number]  Date of birth: 03-31-1941   This patient has been seen and the plan of care was discussed with the house staff. Please see their note for complete details. I concur with their findings with the following additions/corrections: Mental status in much improved. He is fully oriented.  No complaints this morning.  No further growth noted on repeat BC's. Initial BC growth likely a contaminant. Consult ID for recs, but I would favor stopping abx treatment at this time. He has no evidence of localized infection. TTE without obvious valvular vegetations, but endocarditis low on differential at this time. Adjust basal and meal time insulin.  Noted hx of hypoglycemia.  AKI is improving.  Expect D/C tomorrow.  [name], DO, FACP Faculty [facility] Internal Medicine Residency Program 08/19/2013, 1:23 PM

## 2013-08-19 NOTE — Progress Notes (Signed)
Inpatient Diabetes Program Recommendations  AACE/ADA: New Consensus Statement on Inpatient Glycemic Control (2013)  Target Ranges:  Prepandial:   less than 140 mg/dL      Peak postprandial:   less than 180 mg/dL (1-2 hours)      Critically ill patients:  140 - 180 mg/dL     Results for [name], [name] (MRN [id number]) as of 08/19/2013 12:21  Ref. Range 08/17/2013 23:55 08/18/2013 04:16 08/18/2013 07:52 08/18/2013 11:51 08/18/2013 13:25 08/18/2013 16:39 08/18/2013 20:22  Glucose-Capillary Latest Range: 70-99 mg/dL 782 (H) 956 (H) 213 (H) 357 (H) 357 (H) 308 (H) 295 (H)    Results for [name], [name] (MRN [id number]) as of 08/19/2013 12:21  Ref. Range 08/18/2013 23:59 08/19/2013 04:11 08/19/2013 07:44 08/19/2013 11:49  Glucose-Capillary Latest Range: 70-99 mg/dL 629 (H) 528 (H) 413 (H) 366 (H)    MD- Please consider the following recommendations:  1. Change CBG checks to tid ac + HS (currently getting CBGs checked Q4 hours) 2. Please add Novolog meal coverage- Novolog 6 units tid with meals 3. Titrate Lantus upward as needed based on fasting glucose levels (noted Lantus increased to 32 units daily today)   Will follow. Ambrose Finland RN, MSN, CDE Diabetes Coordinator Inpatient Diabetes Program Team Pager: [phone] (8a-10p)

## 2013-08-19 NOTE — Progress Notes (Signed)
Subjective: [name] is a 72 y.o. male w/ PMHx of of DM-II, hypertension, hyperlipidemia, thyroid dysfunction, who presented on 08/14/13 with altered mental status.  Patient seen at bedside this AM. Mental status much improved. Denies any complaints. No fever, chills, nausea, vomiting, chest pain, SOB, lightheadedness, dizziness, or confusion.   Blood sugars still not well controlled at this time. After some discussion with the patient and his wife, it seems that the patient has very low blood sugars at home, frequently in the 60's, sometimes lower. Wife showed log book of insulin regimen and blood sugars, it seems that the patient injects 54 units Lantus in the AM + Regular Insulin 14 units in AM and PM before meals.   Objective: Vital signs in last 24 hours: Filed Vitals:   08/18/13 1400 08/18/13 1900 08/19/13 0424 08/19/13 1018  BP: 118/67 135/64 122/59 149/65  Pulse: 75 58 56 64  Temp: 97.7 F (36.5 C) 98.6 F (37 C) 98.1 F (36.7 C) 97.5 F (36.4 C)  TempSrc: Oral Oral Oral Oral  Resp: 18 18 18 20   Height:      Weight:  226 lb 3.2 oz (102.604 kg)    SpO2: 96% 95% 98% 97%   Weight change: 1 lb 12.4 oz (0.804 kg)  Intake/Output Summary (Last 24 hours) at 08/19/13 1250 Last data filed at 08/19/13 1019  Gross per 24 hour  Intake    740 ml  Output    400 ml  Net    340 ml   Physical Exam: General: Alert, cooperative, and in no apparent distress HEENT: Vision grossly intact, oropharynx clear and non-erythematous  Neck: Full range of motion without pain, supple, no lymphadenopathy or carotid bruits Lungs: Air entry equal bilaterally. No wheezes, crackles, rhonchi. Heart: Regular rate and rhythm, no murmurs, gallops, or rubs Abdomen: Soft, non-tender, non-distended, normal bowel sounds Extremities: No cyanosis, clubbing, or edema Neurologic: A&O x 3. Much more oriented than before, close to baseline. CN's intact. Strength and sensation intact bilaterally.   Lab  Results: Basic Metabolic Panel:  Recent Labs Lab 08/17/13 0625 08/18/13 0415 08/19/13 0634  NA 142  --  141  K 3.5  --  4.1  CL 110  --  109  CO2 22  --  25  GLUCOSE 256*  --  318*  BUN 17  --  19  CREATININE 1.30 1.32 1.31  CALCIUM 7.8*  --  8.5   Liver Function Tests:  Recent Labs Lab 08/14/13 0350  AST 27  ALT 26  ALKPHOS 90  BILITOT 0.8  PROT 6.4  ALBUMIN 3.6   No results found for this basename: LIPASE, AMYLASE,  in the last 168 hours  Recent Labs Lab 08/14/13 0925  AMMONIA 23   CBC:  Recent Labs Lab 08/16/13 0405 08/17/13 0625  WBC 9.0 6.7  NEUTROABS 4.4 3.2  HGB 11.4* 10.6*  HCT 33.2* 30.6*  MCV 88.1 87.9  PLT 166 151   Cardiac Enzymes:  Recent Labs Lab 08/14/13 0350 08/14/13 1109 08/14/13 1115  CKTOTAL  --  221  --   TROPONINI <0.30  --  <0.30   CBG:  Recent Labs Lab 08/18/13 1639 08/18/13 2022 08/18/13 2359 08/19/13 0411 08/19/13 0744 08/19/13 1149  GLUCAP 308* 295* 300* 308* 295* 366*   Hemoglobin A1C:  Recent Labs Lab 08/14/13 1622  HGBA1C 6.8*   Thyroid Function Tests:  Recent Labs Lab 08/14/13 1109  TSH 0.696  FREET4 1.21  T3FREE 2.2*  Coagulation:  Recent Labs Lab 08/14/13 0350  LABPROT 13.1  INR 1.01   Urine Drug Screen: Drugs of Abuse     Component Value Date/Time   LABOPIA NONE DETECTED 08/14/2013 0822   COCAINSCRNUR NONE DETECTED 08/14/2013 0822   LABBENZ NONE DETECTED 08/14/2013 0822   AMPHETMU NONE DETECTED 08/14/2013 0822   THCU NONE DETECTED 08/14/2013 0822   LABBARB NONE DETECTED 08/14/2013 0822    Alcohol Level:  Recent Labs Lab 08/14/13 0350  ETH <11   Urinalysis:  Recent Labs Lab 08/16/13 2230 08/19/13 0003  COLORURINE YELLOW YELLOW  LABSPEC 1.028 1.026  PHURINE 5.0 5.0  GLUCOSEU >1000* >1000*  HGBUR NEGATIVE NEGATIVE  BILIRUBINUR NEGATIVE NEGATIVE  KETONESUR 15* NEGATIVE  PROTEINUR NEGATIVE NEGATIVE  UROBILINOGEN 0.2 0.2  NITRITE NEGATIVE NEGATIVE  LEUKOCYTESUR  NEGATIVE NEGATIVE   Micro Results: Recent Results (from the past 240 hour(s))  CULTURE, BLOOD (ROUTINE X 2)     Status: None   Collection Time    08/14/13  8:45 AM      Result Value Range Status   Specimen Description BLOOD RIGHT ANTECUBITAL   Final   Special Requests BOTTLES DRAWN AEROBIC AND ANAEROBIC 5CC   Final   Culture  Setup Time     Final   Value: 08/14/2013 14:05     Performed at [facility]   Culture     Final   Value: STAPHYLOCOCCUS SPECIES (COAGULASE NEGATIVE)     Note: SUSCEPTIBILITIES PERFORMED ON PREVIOUS CULTURE WITHIN THE LAST 5 DAYS.     Note: Gram Stain Report Called to,Read Back By and Verified With: Holland Commons @ 1034 08/15/13 BY KRAWS     Performed at [facility]   Report Status 08/17/2013 FINAL   Final  CULTURE, BLOOD (ROUTINE X 2)     Status: None   Collection Time    08/14/13  8:50 AM      Result Value Range Status   Specimen Description BLOOD LEFT ANTECUBITAL   Final   Special Requests BOTTLES DRAWN AEROBIC AND ANAEROBIC 5CC   Final   Culture  Setup Time     Final   Value: 08/14/2013 14:06     Performed at [facility]   Culture     Final   Value: STAPHYLOCOCCUS SPECIES (COAGULASE NEGATIVE)     Note: RIFAMPIN AND GENTAMICIN SHOULD NOT BE USED AS SINGLE DRUGS FOR TREATMENT OF STAPH INFECTIONS. Two isolates with different morphologies were identified as the same organism.The most resistant organism was reported.     0606 Note: Gram Stain Report Called to,Read Back By and Verified With: Deborra Medina 08/15/2013 [facility]     Performed at [facility]   Report Status 08/17/2013 FINAL   Final   Organism ID, Bacteria STAPHYLOCOCCUS SPECIES (COAGULASE NEGATIVE)   Final  MRSA PCR SCREENING     Status: Abnormal   Collection Time    08/14/13  3:13 PM      Result Value Range Status   MRSA by PCR POSITIVE (*) NEGATIVE Final   Comment:            The GeneXpert MRSA Assay (FDA     approved for NASAL specimens     only), is one  component of a     comprehensive MRSA colonization     surveillance program. It is not     intended to diagnose MRSA     infection nor to guide or     monitor treatment for  MRSA infections.     RESULT CALLED TO, READ BACK BY AND VERIFIED WITH:     FLEMING RN 17:00 08/14/13 ([email or web address])  CULTURE, BLOOD (ROUTINE X 2)     Status: None   Collection Time    08/15/13  8:30 AM      Result Value Range Status   Specimen Description BLOOD RIGHT ARM   Final   Special Requests BOTTLES DRAWN AEROBIC AND ANAEROBIC 5CC   Final   Culture  Setup Time     Final   Value: 08/15/2013 13:58     Performed at [facility]   Culture     Final   Value:        BLOOD CULTURE RECEIVED NO GROWTH TO DATE CULTURE WILL BE HELD FOR 5 DAYS BEFORE ISSUING A FINAL NEGATIVE REPORT     Performed at [facility]   Report Status PENDING   Incomplete  CULTURE, BLOOD (ROUTINE X 2)     Status: None   Collection Time    08/15/13  8:35 AM      Result Value Range Status   Specimen Description BLOOD RIGHT HAND   Final   Special Requests BOTTLES DRAWN AEROBIC ONLY 5CC   Final   Culture  Setup Time     Final   Value: 08/15/2013 13:58     Performed at [facility]   Culture     Final   Value:        BLOOD CULTURE RECEIVED NO GROWTH TO DATE CULTURE WILL BE HELD FOR 5 DAYS BEFORE ISSUING A FINAL NEGATIVE REPORT     Performed at [facility]   Report Status PENDING   Incomplete  CULTURE, BLOOD (ROUTINE X 2)     Status: None   Collection Time    08/18/13 12:40 PM      Result Value Range Status   Specimen Description BLOOD RIGHT ARM   Final   Special Requests BOTTLES DRAWN AEROBIC AND ANAEROBIC 10CC   Final   Culture  Setup Time     Final   Value: 08/18/2013 20:32     Performed at [facility]   Culture     Final   Value:        BLOOD CULTURE RECEIVED NO GROWTH TO DATE CULTURE WILL BE HELD FOR 5 DAYS BEFORE ISSUING A FINAL NEGATIVE REPORT     Performed at [facility]   Report  Status PENDING   Incomplete  CULTURE, BLOOD (ROUTINE X 2)     Status: None   Collection Time    08/18/13 12:55 PM      Result Value Range Status   Specimen Description BLOOD LEFT ARM   Final   Special Requests BOTTLES DRAWN AEROBIC AND ANAEROBIC 10 CC   Final   Culture  Setup Time     Final   Value: 08/18/2013 20:32     Performed at [facility]   Culture     Final   Value:        BLOOD CULTURE RECEIVED NO GROWTH TO DATE CULTURE WILL BE HELD FOR 5 DAYS BEFORE ISSUING A FINAL NEGATIVE REPORT     Performed at [facility]   Report Status PENDING   Incomplete   Studies/Results: No results found. Medications: I have reviewed the patient's current medications. Scheduled Meds: . heparin  5,000 Units Subcutaneous Q8H  . insulin aspart  6 Units Subcutaneous TID WC  . insulin glargine  32 Units Subcutaneous Daily  . mupirocin ointment  1 application Nasal BID  . sodium chloride  3 mL Intravenous Q12H  . thyroid  120 mg Oral QAC breakfast   Continuous Infusions:   PRN Meds:.  Assessment/Plan: 72 year old man with a past medical history for diabetes, hypertension, thyroid dysfunction, who presented on 08/14/13 with altered mental status. CT head negative for acute abnormalities. Portable chest x-ray showed infiltration. Leukocytosis with WBC 14.0 on admission w/ Temp of 100.3. Electrolytes normal with AG of 15 and HCO3 of 22. UDS negative. Urinalysis negative for UTI. Lactic acid 0.77. Blood alcohol level <11.   #: Acute encephalopathy w/ gram +ve bacteremia: Etiology is still not completely clear at this moment. Patient had 2/2 +ve blood cultures from 08/14/13 for gram +ve cocci in clusters, found to be Coag negative. Repeat cultures still negative at this time. Discussed with ID in terms of blood cultures and next step for Mr Calica, Suspected that blood cultures are most likely contaminate at this time. Patient afebrile, no white count. Mental status improved, however,  according to the wife, patient still not back to baseline.  -D/c Vancomycin -Suspected encephalopathy most likely d/t hypoglycemia at this time after review of home blood sugar readings and insulin regimen with wife. Patient uses 54 units Lantus in AM + 14 units Regular Insulin in AM and PM with meals (w/ some additional correction in PM, unclear as to exactly what patient does). See additional DM details below.  #AKI: Patient has baseline cr of 0.90-1.00. Today it was 1.31. See trend as follows:   Recent Labs Lab 08/16/13 0405 08/16/13 1150 08/17/13 0625 08/18/13 0415 08/19/13 0634  CREATININE 1.42* 1.52* 1.30 1.32 1.31  -Improving.   #: HTN: bp is stable.  -Continue home medications: Lotensin 40 mg daily, hydrochlorothiazide 25 mg daily   #: HLD: Patient is on Lipitor 40 mg daily. Liver functions normal  -will continue Lipitor 40 mg daily.   #: Hx of Graves' disease: Diagnosed 2010. Obviously converted to hypothyroidism since patient is currently on ARMOUR at home.  -TSH, Free T4 and T3 wnl -Will continue Armour 120 mg daily for now.  #: DM-II: HbA1c 6.8. Patient was on Lantus 54 units in Am at home + 14 units Regular Insulin in AM and PM with meals (w/ some additional correction in PM, unclear as to exactly what patient does). After some review of sugars at home, patient frequently has very low sugars in the AM prior to insulin use. Some readings in the 40's and 60's. This could be the most likely etiology of AMS at this time. -Sugars have been labile over the last 2 days. Readings as follows:  Recent Labs Lab 08/18/13 2022 08/18/13 2359 08/19/13 0411 08/19/13 0744 08/19/13 1149  GLUCAP 295* 300* 308* 295* 366*  -Increased Lantus to 32 units in AM + Novolog 6 units with meals.  -Discussed insulin regimen on discharge with patient as his sugars are dangerously low on his current medications.  He became irate when Novolog insulin use was suggested instead of his current home  regimen. It was explained to him at length that his sugars are much too tightly controlled and he said he prefers to have them where they are and he refused to change his insulin use from what he does currently.  -Will discuss further with patient in AM as he is still slightly altered, according to the wife. Will attempt to make adjustments specific to what may work for Mr. Farra.  #  DVT px: Heparin sq  Dispo: Disposition is deferred at this time, awaiting improvement of current medical problems.  Anticipated discharge in approximately 2-3 day(s).   The patient does not have a current PCP (No primary provider on file.) and does need an [facility] hospital follow-up appointment after discharge.  The patient does not have transportation limitations that hinder transportation to clinic appointments.  .Services Needed at time of discharge: Y = Yes, Blank = No PT:   OT:   RN:   Equipment:   Other:     LOS: 5 days   Courtney Paris, MD 08/19/2013, 12:50 PM Pager: [phone]

## 2013-08-19 NOTE — Clinical Documentation Improvement (Signed)
THIS DOCUMENT IS NOT A PERMANENT PART OF THE MEDICAL RECORD  Please update your documentation with the medical record to reflect your response to this query. If you need help knowing how to do this please call [phone].  08/19/13  Dear Dr. Lars Masson Marton Redwood,  In a better effort to capture your patient's severity of illness, reflect appropriate length of stay and utilization of resources, a review of the patient medical record has revealed the following indicators.    Based on your clinical judgment, please clarify and document in a progress note and/or discharge summary the clinical condition associated with the following supporting information:  In responding to this query please exercise your independent judgment.  The fact that a query is asked, does not imply that any particular answer is desired or expected.   Possible Clinical Conditions?  Septicemia / Sepsis Severe Sepsis  Septic Shock Sepsis due to an internal device Bacterial infection of unknown etiology / source Other Condition  Cannot clinically Determine  Supporting Information:  Presenting Signs and Symptoms:(As per notes) "Encephalopathy", " AKI"  Diagnostics:  "EKG:  Sinus rhythm, regular, =90 beats/minute" onmouseout="CVCadiCrossRefView.mouseOverNegation(this, false)" type="X" negId="cadi-neg:"tachycardia with heart rates 102, marginally left axis deviation, normal axis, early R wave progression, normal QT interval, No ischemic change in T waves or ST segments.Assessment & Plan by Problem:   LABS: Leukocytosis with 12,000 or < 4,000" onmouseout="CVCadiCrossRefView.mouseOverNegation(this, false)" type="L-" negId="cadi-neg:"WBC 14.0.  CULTURE:STAPHYLOCOCCUS SPECIES  Culture Final GRAM POSITIVE COCCI IN CLUSTERS 0606 Gram Stain Report Called to,Read Back By and Verified With: Deborra Medina 08/15/2013 [facility]   Treatment:(As per notes) " Vancomycin 2 gm IV X 1 then 1gm IV q12 hours as per obesity  nomogram"  Reviewed: Please see discharge summary. After review of home CBG's with wife in combination with CoNS culture, it was thought that the patient's AMS was most likely d/t hypoglycemia.  Thank You,  Nevin Bloodgood RN, BSN ,CCDS  Clinical Documentation Specialist: [phone] [phone] Health Information Management [place]

## 2013-08-19 NOTE — Progress Notes (Signed)
Physical Therapy Treatment Patient Details Name: [name] MRN: [id number] DOB: 1941/06/28 Today's Date: 08/19/2013 Time: 8469-6295 PT Time Calculation (min): 19 min  PT Assessment / Plan / Recommendation  History of Present Illness Patient is a 72 year old male with past medical history of DM-II, hypertension, hyperlipidemia, thyroid dysfunction, who presents with altered mental status   PT Comments   Pt slowly progressing with therapy. Pt displayed decr safety awareness and decr awareness of deficits with mobility. Pt can be unsteady and unsafe with gt at comes; especially when distracted or dynamically challenged. Pt and wife encouraged to provide 24/7 support and attend OPPT if balance deficits continue. Pt and wife agreeable.   Follow Up Recommendations  Outpatient PT;Supervision/Assistance - 24 hour     Does the patient have the potential to tolerate intense rehabilitation     Barriers to Discharge        Equipment Recommendations  None recommended by PT    Recommendations for Other Services    Frequency Min 3X/week   Progress towards PT Goals Progress towards PT goals: Progressing toward goals  Plan Discharge plan needs to be updated    Precautions / Restrictions Precautions Precautions: Fall Restrictions Weight Bearing Restrictions: No   Pertinent Vitals/Pain No c/o pain. See VSS     Mobility  Bed Mobility Bed Mobility: Sitting - Scoot to Edge of Bed;Supine to Sit;Sit to Supine Supine to Sit: 5: Supervision;HOB elevated;With rails Sitting - Scoot to Edge of Bed: 5: Supervision Sit to Supine: 5: Supervision;HOB flat;With rail Details for Bed Mobility Assistance: cues for safety and required handrails for mobility  Transfers Transfers: Sit to Stand;Stand to Sit Sit to Stand: 5: Supervision;From bed Stand to Sit: 5: Supervision;To bed Details for Transfer Assistance: cues for hand placement and safety with RW  Ambulation/Gait Ambulation/Gait Assistance: 4:  Min guard Ambulation Distance (Feet): 100 Feet (total; 80 then 20 ) Assistive device: Rolling walker;None Ambulation/Gait Assistance Details: initially amb without RW and pt required min guard (A) for safety and to steady; pt with antalgic gt and attributes this due to previous ankle surgeries; attempted to amb with RW however, pt had difficulty safely amb with RW; would pick RW up and demo decreased safety awareness with it during turns; RW caused pt to incr amb to unsafe speed; pt encoured to amb with cane and have (A) upon acute D/C home Gait Pattern: Antalgic Gait velocity: pt attempts to increase but is unsafe and seemed to be unaware that he was leaning increasingly anterior  Stairs: No Wheelchair Mobility Wheelchair Mobility: No    Exercises Other Exercises Other Exercises: wife concerned about pt becoming deconditioned. pt refusing to perform LE exercises at this time. Wife was educated on 3 exercises for husband; educated on heel slides, hip abduction and SLR; encouraged to complete 2x a day 3 sets of 10   PT Diagnosis:    PT Problem List:   PT Treatment Interventions:     PT Goals (current goals can now be found in the care plan section) Acute Rehab PT Goals Patient Stated Goal: none stated  PT Goal Formulation: With patient/family Time For Goal Achievement: 08/23/13 Potential to Achieve Goals: Good  Visit Information  Last PT Received On: 08/19/13 Assistance Needed: +1 History of Present Illness: Patient is a 72 year old male with past medical history of DM-II, hypertension, hyperlipidemia, thyroid dysfunction, who presents with altered mental status    Subjective Data  Subjective: Pt lying supine with wife present; agreeable to therapy Patient Stated Goal:  none stated    Cognition  Cognition Arousal/Alertness: Awake/alert Behavior During Therapy: Flat affect Overall Cognitive Status: Impaired/Different from baseline Area of Impairment: Safety/judgement;Problem  solving Orientation Level: Situation;Disoriented to Safety/Judgement: Decreased awareness of deficits Problem Solving: Slow processing;Requires verbal cues;Requires tactile cues;Difficulty sequencing General Comments: pt with very flat affect; seemed to require increased time to respond; pt seemed unaware of mobility and balance deficits issues    Balance  Balance Balance Assessed: Yes Static Standing Balance Static Standing - Balance Support: No upper extremity supported;During functional activity Static Standing - Level of Assistance: 5: Stand by assistance;Other (comment) (stand by to min guard) Static Standing - Comment/# of Minutes: changed BOS small  <>; pt EC and EO with mild perturbations pt required min guard to SBA to maintain balance  Dynamic Standing Balance Dynamic Standing - Balance Support: No upper extremity supported;During functional activity Dynamic Standing - Level of Assistance: 5: Stand by assistance;Other (comment) (to min guard) Dynamic Standing - Balance Activities: Reaching for objects;Reaching across midline Dynamic Standing - Comments: pt able to pick object up off ground with min guard (A)   End of Session PT - End of Session Equipment Utilized During Treatment: Gait belt Activity Tolerance: Patient tolerated treatment well Patient left: in bed;with call bell/phone within reach;with bed alarm set;with family/visitor present Nurse Communication: Mobility status   GP     [name], [place] [phone] 08/19/2013, 2:24 PM

## 2013-08-20 LAB — BASIC METABOLIC PANEL
BUN: 17 mg/dL (ref 6–23)
CO2: 27 mEq/L (ref 19–32)
Calcium: 8.7 mg/dL (ref 8.4–10.5)
Chloride: 112 mEq/L (ref 96–112)
Creatinine, Ser: 1.22 mg/dL (ref 0.50–1.35)
GFR calc Af Amer: 66 mL/min — ABNORMAL LOW (ref >90)
GFR calc non Af Amer: 57 mL/min — ABNORMAL LOW (ref >90)
Glucose, Bld: 139 mg/dL — ABNORMAL HIGH (ref 70–99)
Potassium: 3.7 mEq/L (ref 3.5–5.1)

## 2013-08-20 LAB — GLUCOSE, CAPILLARY: Glucose-Capillary: 206 mg/dL — ABNORMAL HIGH (ref 70–99)

## 2013-08-20 MED ORDER — INSULIN REGULAR HUMAN 100 UNIT/ML IJ SOLN: 6.0000 [IU] | Freq: Three times a day (TID) | INTRAMUSCULAR | Status: DC

## 2013-08-20 MED ORDER — INSULIN GLARGINE 100 UNIT/ML ~~LOC~~ SOLN: 32.0000 [IU] | Freq: Every morning | SUBCUTANEOUS | Status: DC

## 2013-08-20 NOTE — Progress Notes (Signed)
Subjective: [name] is a 72 y.o. male w/ PMHx of of DM-II, hypertension, hyperlipidemia, thyroid dysfunction, who presented on 08/14/13 with altered mental status.  Patient seen at bedside this AM. Still improved from yesterday. Patient A&O x 4. Says he is ready to go home. Denies any SOB, chest pain, dizziness, lightheadedness, palpitations, or abdominal pain.  Blood sugars improved w/ 32 units of Lantus + 6 units Novolog tid wc. Most recent CBG 175.  Objective: Vital signs in last 24 hours: Filed Vitals:   08/19/13 1018 08/19/13 1358 08/19/13 2131 08/20/13 0452  BP: 149/65 149/63 121/63 120/68  Pulse: 64 82 57 50  Temp: 97.5 F (36.4 C) 97.9 F (36.6 C) 98.9 F (37.2 C) 99.2 F (37.3 C)  TempSrc: Oral Oral Oral Oral  Resp: 20 22 20 20   Height:      Weight:   222 lb 6.4 oz (100.88 kg)   SpO2: 97% 97% 97% 97%   Weight change: -3 lb 12.8 oz (-1.724 kg)  Intake/Output Summary (Last 24 hours) at 08/20/13 1610 Last data filed at 08/19/13 1800  Gross per 24 hour  Intake    720 ml  Output      0 ml  Net    720 ml   Physical Exam: General: Alert, cooperative, and in no apparent distress HEENT: Vision grossly intact, oropharynx clear and non-erythematous  Neck: Full range of motion without pain, supple, no lymphadenopathy or carotid bruits Lungs: Air entry equal bilaterally. No wheezes, crackles, rhonchi. Heart: Regular rate and rhythm, no murmurs, gallops, or rubs Abdomen: Soft, non-tender, non-distended, normal bowel sounds Extremities: No cyanosis, clubbing, or edema Neurologic: A&O x 3. CN's intact. Strength and sensation intact bilaterally.   Lab Results: Basic Metabolic Panel:  Recent Labs Lab 08/19/13 0634 08/20/13 0444  NA 141 146*  K 4.1 3.7  CL 109 112  CO2 25 27  GLUCOSE 318* 139*  BUN 19 17  CREATININE 1.31 1.22  CALCIUM 8.5 8.7   Liver Function Tests:  Recent Labs Lab 08/14/13 0350  AST 27  ALT 26  ALKPHOS 90  BILITOT 0.8  PROT  6.4  ALBUMIN 3.6   No results found for this basename: LIPASE, AMYLASE,  in the last 168 hours  Recent Labs Lab 08/14/13 0925  AMMONIA 23   CBC:  Recent Labs Lab 08/16/13 0405 08/17/13 0625  WBC 9.0 6.7  NEUTROABS 4.4 3.2  HGB 11.4* 10.6*  HCT 33.2* 30.6*  MCV 88.1 87.9  PLT 166 151   Cardiac Enzymes:  Recent Labs Lab 08/14/13 0350 08/14/13 1109 08/14/13 1115  CKTOTAL  --  221  --   TROPONINI <0.30  --  <0.30   CBG:  Recent Labs Lab 08/18/13 2359 08/19/13 0411 08/19/13 0744 08/19/13 1149 08/19/13 2131 08/20/13 0746  GLUCAP 300* 308* 295* 366* 184* 175*   Hemoglobin A1C:  Recent Labs Lab 08/14/13 1622  HGBA1C 6.8*   Thyroid Function Tests:  Recent Labs Lab 08/14/13 1109  TSH 0.696  FREET4 1.21  T3FREE 2.2*   Coagulation:  Recent Labs Lab 08/14/13 0350  LABPROT 13.1  INR 1.01   Urine Drug Screen: Drugs of Abuse     Component Value Date/Time   LABOPIA NONE DETECTED 08/14/2013 0822   COCAINSCRNUR NONE DETECTED 08/14/2013 0822   LABBENZ NONE DETECTED 08/14/2013 0822   AMPHETMU NONE DETECTED 08/14/2013 0822   THCU NONE DETECTED 08/14/2013 0822   LABBARB NONE DETECTED 08/14/2013 0822    Alcohol Level:  Recent Labs Lab 08/14/13 0350  ETH <11   Urinalysis:  Recent Labs Lab 08/16/13 2230 08/19/13 0003  COLORURINE YELLOW YELLOW  LABSPEC 1.028 1.026  PHURINE 5.0 5.0  GLUCOSEU >1000* >1000*  HGBUR NEGATIVE NEGATIVE  BILIRUBINUR NEGATIVE NEGATIVE  KETONESUR 15* NEGATIVE  PROTEINUR NEGATIVE NEGATIVE  UROBILINOGEN 0.2 0.2  NITRITE NEGATIVE NEGATIVE  LEUKOCYTESUR NEGATIVE NEGATIVE   Micro Results: Recent Results (from the past 240 hour(s))  CULTURE, BLOOD (ROUTINE X 2)     Status: None   Collection Time    08/14/13  8:45 AM      Result Value Range Status   Specimen Description BLOOD RIGHT ANTECUBITAL   Final   Special Requests BOTTLES DRAWN AEROBIC AND ANAEROBIC 5CC   Final   Culture  Setup Time     Final   Value:  08/14/2013 14:05     Performed at [facility]   Culture     Final   Value: STAPHYLOCOCCUS SPECIES (COAGULASE NEGATIVE)     Note: SUSCEPTIBILITIES PERFORMED ON PREVIOUS CULTURE WITHIN THE LAST 5 DAYS.     Note: Gram Stain Report Called to,Read Back By and Verified With: Holland Commons @ 1034 08/15/13 BY KRAWS     Performed at [facility]   Report Status 08/17/2013 FINAL   Final  CULTURE, BLOOD (ROUTINE X 2)     Status: None   Collection Time    08/14/13  8:50 AM      Result Value Range Status   Specimen Description BLOOD LEFT ANTECUBITAL   Final   Special Requests BOTTLES DRAWN AEROBIC AND ANAEROBIC 5CC   Final   Culture  Setup Time     Final   Value: 08/14/2013 14:06     Performed at [facility]   Culture     Final   Value: STAPHYLOCOCCUS SPECIES (COAGULASE NEGATIVE)     Note: RIFAMPIN AND GENTAMICIN SHOULD NOT BE USED AS SINGLE DRUGS FOR TREATMENT OF STAPH INFECTIONS. Two isolates with different morphologies were identified as the same organism.The most resistant organism was reported.     0606 Note: Gram Stain Report Called to,Read Back By and Verified With: Deborra Medina 08/15/2013 [facility]     Performed at [facility]   Report Status 08/17/2013 FINAL   Final   Organism ID, Bacteria STAPHYLOCOCCUS SPECIES (COAGULASE NEGATIVE)   Final  MRSA PCR SCREENING     Status: Abnormal   Collection Time    08/14/13  3:13 PM      Result Value Range Status   MRSA by PCR POSITIVE (*) NEGATIVE Final   Comment:            The GeneXpert MRSA Assay (FDA     approved for NASAL specimens     only), is one component of a     comprehensive MRSA colonization     surveillance program. It is not     intended to diagnose MRSA     infection nor to guide or     monitor treatment for     MRSA infections.     RESULT CALLED TO, READ BACK BY AND VERIFIED WITH:     FLEMING RN 17:00 08/14/13 ([email or web address])  CULTURE, BLOOD (ROUTINE X 2)     Status: None   Collection Time    08/15/13   8:30 AM      Result Value Range Status   Specimen Description BLOOD RIGHT ARM   Final   Special Requests BOTTLES DRAWN AEROBIC  AND ANAEROBIC 5CC   Final   Culture  Setup Time     Final   Value: 08/15/2013 13:58     Performed at [facility]   Culture     Final   Value:        BLOOD CULTURE RECEIVED NO GROWTH TO DATE CULTURE WILL BE HELD FOR 5 DAYS BEFORE ISSUING A FINAL NEGATIVE REPORT     Performed at [facility]   Report Status PENDING   Incomplete  CULTURE, BLOOD (ROUTINE X 2)     Status: None   Collection Time    08/15/13  8:35 AM      Result Value Range Status   Specimen Description BLOOD RIGHT HAND   Final   Special Requests BOTTLES DRAWN AEROBIC ONLY 5CC   Final   Culture  Setup Time     Final   Value: 08/15/2013 13:58     Performed at [facility]   Culture     Final   Value:        BLOOD CULTURE RECEIVED NO GROWTH TO DATE CULTURE WILL BE HELD FOR 5 DAYS BEFORE ISSUING A FINAL NEGATIVE REPORT     Performed at [facility]   Report Status PENDING   Incomplete  CULTURE, BLOOD (ROUTINE X 2)     Status: None   Collection Time    08/18/13 12:40 PM      Result Value Range Status   Specimen Description BLOOD RIGHT ARM   Final   Special Requests BOTTLES DRAWN AEROBIC AND ANAEROBIC 10CC   Final   Culture  Setup Time     Final   Value: 08/18/2013 20:32     Performed at [facility]   Culture     Final   Value:        BLOOD CULTURE RECEIVED NO GROWTH TO DATE CULTURE WILL BE HELD FOR 5 DAYS BEFORE ISSUING A FINAL NEGATIVE REPORT     Performed at [facility]   Report Status PENDING   Incomplete  CULTURE, BLOOD (ROUTINE X 2)     Status: None   Collection Time    08/18/13 12:55 PM      Result Value Range Status   Specimen Description BLOOD LEFT ARM   Final   Special Requests BOTTLES DRAWN AEROBIC AND ANAEROBIC 10 CC   Final   Culture  Setup Time     Final   Value: 08/18/2013 20:32     Performed at [facility]   Culture      Final   Value:        BLOOD CULTURE RECEIVED NO GROWTH TO DATE CULTURE WILL BE HELD FOR 5 DAYS BEFORE ISSUING A FINAL NEGATIVE REPORT     Performed at [facility]   Report Status PENDING   Incomplete   Studies/Results: No results found. Medications: I have reviewed the patient's current medications. Scheduled Meds: . heparin  5,000 Units Subcutaneous Q8H  . insulin aspart  6 Units Subcutaneous TID WC  . insulin glargine  32 Units Subcutaneous Daily  . sodium chloride  3 mL Intravenous Q12H  . thyroid  120 mg Oral QAC breakfast    Assessment/Plan: 72 year old man with a past medical history for diabetes, hypertension, thyroid dysfunction, who presented on 08/14/13 with altered mental status. CT head negative for acute abnormalities. Portable chest x-ray showed infiltration. Leukocytosis with WBC 14.0 on admission w/ Temp of 100.3. Electrolytes normal with AG of  15 and HCO3 of 22. UDS negative. Urinalysis negative for UTI. Lactic acid 0.77. Blood alcohol level <11.   #: Acute encephalopathy w/ gram +ve bacteremia: Etiology is still not completely clear at this moment. Patient had 2/2 +ve blood cultures from 08/14/13 for gram +ve cocci in clusters, found to be Coag negative. Repeat cultures still negative at this time. Discussed with ID, suspect that blood cultures are most likely contaminate at this time. Patient afebrile, no white count. Mental status improved. -Suspect encephalopathy most likely d/t hypoglycemia at this time after review of home blood sugar readings and insulin regimen with wife. Patient uses 54 units Lantus in AM + 14 units Regular Insulin in AM and PM with meals (w/ some additional correction in PM, unclear as to exactly what patient does). See additional DM details below.  #AKI: Patient has baseline cr of 0.90-1.00. Today it was 1.22. See trend as follows:   Recent Labs Lab 08/16/13 1150 08/17/13 0625 08/18/13 0415 08/19/13 0634 08/20/13 0444    CREATININE 1.52* 1.30 1.32 1.31 1.22  -Improving.   #: HTN: bp is stable, well controlled.  -Continue home medications: Lotensin 40 mg daily, hydrochlorothiazide 25 mg daily   #: HLD: Patient is on Lipitor 40 mg daily. Liver functions normal  -will continue Lipitor 40 mg daily.   #: Hx of Graves' disease: Diagnosed 2010. Obviously converted to hypothyroidism since patient is currently on ARMOUR at home.  -TSH, Free T4 and T3 wnl -Will continue Armour 120 mg daily for now.  #: DM-II: HbA1c 6.8. Patient was on Lantus 54 units in AM at home + 14 units Regular Insulin in AM and PM with meals (w/ some additional correction in PM, unclear as to exactly what patient does). After some review of sugars at home, patient frequently has very low sugars in the AM prior to insulin use. Some readings in the 40's and 60's. This is most likely etiology of AMS at this time. -Sugars had previously been very labile during admission, however, most recent readings have improved with new regimen. Readings as follows:  Recent Labs Lab 08/19/13 0411 08/19/13 0744 08/19/13 1149 08/19/13 2131 08/20/13 0746  GLUCAP 308* 295* 366* 184* 175*  -Continue Lantus to 32 units in AM + Novolog 6 units with meals.  -Discussed insulin regimen on discharge with patient yesterday as his sugars are dangerously low on his current medications. He became irate when Novolog insulin use was suggested instead of his current home regimen. It was explained to him at length that his sugars are much too tightly controlled and he said he prefers to have them where they are and he refused to change his insulin use from what he does currently.  -Discussed further with patient today and he still claims he would like to continue the home regimen he was previously on. Talked to his PCP, Dr. Beverely Risen, who gave some insight about his DM treatment. She says he is extremely stubborn about his regimen and frequently has issues with him following  her recommendations.   # DVT px: Heparin sq  Dispo: Disposition is deferred at this time, awaiting improvement of current medical problems. Anticipated discharge today.  The patient does not have a current PCP (No primary provider on file.) and does need an [facility] hospital follow-up appointment after discharge.  The patient does not have transportation limitations that hinder transportation to clinic appointments.  .Services Needed at time of discharge: Y = Yes, Blank = No PT:   OT:  RN:   Equipment:   Other:     LOS: 6 days   Courtney Paris, MD 08/20/2013, 8:26 AM Pager: [phone]

## 2013-08-20 NOTE — Progress Notes (Signed)
Visit to patient while in hospital. Will call after patient is discharged to assist with transition of care. 

## 2013-08-20 NOTE — Progress Notes (Signed)
Physical Therapy Treatment Patient Details Name: [name] MRN: [id number] DOB: 06/04/1941 Today's Date: 08/20/2013 Time: 0834-0900 PT Time Calculation (min): 26 min  PT Assessment / Plan / Recommendation  History of Present Illness Patient is a 72 year old male with past medical history of DM-II, hypertension, hyperlipidemia, thyroid dysfunction, who presents with altered mental status   PT Comments   Pt continues to have flat affect and slow to process.  Wife present at end of session.  Continue to recommend OPPT for higher balance activities and continue to use RW for ambulation.    Follow Up Recommendations  Outpatient PT;Supervision/Assistance - 24 hour     Equipment Recommendations  None recommended by PT    Frequency Min 3X/week   Progress towards PT Goals Progress towards PT goals: Progressing toward goals  Plan Current plan remains appropriate    Precautions / Restrictions Precautions Precautions: Fall   Pertinent Vitals/Pain No c/o pain    Mobility  Bed Mobility Bed Mobility: Not assessed Transfers Transfers: Sit to Stand;Stand to Sit Sit to Stand: 5: Supervision;From chair/3-in-1;With armrests Stand to Sit: 5: Supervision;To chair/3-in-1;With armrests Details for Transfer Assistance: cues for hand placement and safety with RW  Ambulation/Gait Ambulation/Gait Assistance: 4: Min guard Ambulation Distance (Feet): 200 Feet Assistive device: Rolling walker Ambulation/Gait Assistance Details: Max cues for upright posture and forward head gaze; minguard for safety with max cues for RW placement; pt continues to ambulate with chronic antalgic gait due to history of bilateral ankle injuries.  Gait Pattern: Antalgic Stairs: No Wheelchair Mobility Wheelchair Mobility: No    Exercises General Exercises - Lower Extremity Long Arc Quad: Strengthening;Both;10 reps;Seated Straight Leg Raises: Strengthening;Both;10 reps;Seated Hip Flexion/Marching:  Strengthening;Both;Seated Toe Raises: Strengthening;Both;10 reps   PT Diagnosis:    PT Problem List:   PT Treatment Interventions:     PT Goals (current goals can now be found in the care plan section) Acute Rehab PT Goals Patient Stated Goal: none stated  PT Goal Formulation: With patient/family Time For Goal Achievement: 08/23/13 Potential to Achieve Goals: Good  Visit Information  Last PT Received On: 08/20/13 Assistance Needed: +1 History of Present Illness: Patient is a 72 year old male with past medical history of DM-II, hypertension, hyperlipidemia, thyroid dysfunction, who presents with altered mental status    Subjective Data  Subjective: Pt sitting in recliner agreeable to therapy Patient Stated Goal: none stated    Cognition  Cognition Arousal/Alertness: Awake/alert Behavior During Therapy: Flat affect Overall Cognitive Status: Impaired/Different from baseline Area of Impairment: Safety/judgement;Problem solving Orientation Level: Situation;Disoriented to Memory: Decreased short-term memory Safety/Judgement: Decreased awareness of deficits Problem Solving: Slow processing;Requires verbal cues;Requires tactile cues;Difficulty sequencing General Comments: pt with very flat affect; seemed to require increased time to respond; pt seemed unaware of mobility and balance deficits issues    Balance  Balance Balance Assessed: Yes Static Standing Balance Static Standing - Balance Support: No upper extremity supported;During functional activity Static Standing - Level of Assistance: 5: Stand by assistance;Other (comment) Static Standing - Comment/# of Minutes: ~5 minutes while performing ADLs at sink with supervision for safety.    End of Session PT - End of Session Equipment Utilized During Treatment: Gait belt Activity Tolerance: Patient tolerated treatment well Patient left: in chair;with call bell/phone within reach;with family/visitor present (wife came at end of  session) Nurse Communication: Mobility status   GP     [name] 08/20/2013, 9:10 AM  Jake Shark, PT DPT [phone]

## 2013-08-20 NOTE — Progress Notes (Signed)
  Date: 08/20/2013  Patient name: [name]  Medical record number: [id number]  Date of birth: Jul 10, 1941   This patient has been seen and the plan of care was discussed with the house staff. Please see their note for complete details. I concur with their findings with the following additions/corrections: Discussed concern with patient and wife regarding hypoglycemia. Would D/C Lantus dose by at leat 25% and decrease Regular insulin dose by about 25% as well. Update the patient's PCP. Appreciate ID input. No need to continue abx therapy, likely contaminant organisms. The patient is back to baseline per wife in room. I suspect etiology of his mental status change was hypoglycemia. Otherwise, appreciate PT recs and medically stable for D/C.  [name], DO, FACP [facility] 08/20/2013, 1:12 PM

## 2013-08-20 NOTE — Progress Notes (Signed)
Patient discharged.  Discharge medications, prescriptions, instructions, and follow-up appointments discussed with patient, to which patient verbalized understanding.  IV removed.  Patient belongings gathered.  Reminded patient that he is not to take his regular insulin if his blood glucose is below 70.  Patient verbalized understanding.  Patient escorted out via wheelchair with volunteer services.

## 2013-08-21 LAB — CULTURE, BLOOD (ROUTINE X 2): Culture: NO GROWTH

## 2013-08-21 NOTE — Discharge Summary (Signed)
Name: [name] MRN: [id number] DOB: Feb 26, 1941 72 y.o. PCP: No primary provider on file.  Date of Admission: 08/14/2013  2:22 AM Date of Discharge: 08/25/2013 Attending Physician: Kem Kays  Discharge Diagnosis: 1. Acute Encephalopathy- [name] found to have 2 +ve blood cultures initially, for CoNS. Treated w/ Vancomycin for 4 days, but most likely contamination. Repeat blood cultures -ve. AMS thought most likely to be d/t hypoglycemia after some review of home glucose control. 2. DM type II- Labile blood sugars during admission. Blood sugars became controlled after starting Lantus 32 units in AM + 6 units tid w/ meals. [name] takes 51 untis of Lantus at home + 19 units of Regular Insulin bid w/ breakfast and dinner. After reviewing his home glucose log, [name] with frequent lows at home.  3. HTN 4. HLD 5. Thyroid dysfunction 6. Acute Kidney Injury  Discharge Medications:   Medication List         aspirin EC 81 MG tablet  Take 81 mg by mouth daily.     atorvastatin 40 MG tablet  Commonly known as:  LIPITOR  Take 40 mg by mouth daily.     benazepril 40 MG tablet  Commonly known as:  LOTENSIN  Take 40 mg by mouth daily.     diclofenac 50 MG EC tablet  Commonly known as:  VOLTAREN  Take 100 mg by mouth daily.     Fish Oil 1200 MG Caps  Take 1 capsule by mouth daily.     hydrochlorothiazide 25 MG tablet  Commonly known as:  HYDRODIURIL  Take 25 mg by mouth daily.     HYDROcodone-acetaminophen 7.5-325 MG per tablet  Commonly known as:  NORCO  1 tablet every 6 (six) hours as needed for pain.     insulin glargine 100 UNIT/ML injection  Commonly known as:  LANTUS  Inject 0.32 mLs (32 Units total) into the skin every morning.     insulin regular 100 units/mL injection  Commonly known as:  HUMULIN R  Inject 0.06 mLs (6 Units total) into the skin 3 (three) times daily before meals.     OXYCONTIN 10 mg T12a 12 hr tablet  Generic drug:  OxyCODONE  Take 10 mg by  mouth every 12 (twelve) hours.     pyridOXINE 100 MG tablet  Commonly known as:  VITAMIN B-6  Take 100 mg by mouth daily.     Selenium 200 MCG Tabs  Take 1 tablet by mouth daily.     SPIRULINA PO  Take 750 mg by mouth 3 (three) times daily.     thyroid 120 MG tablet  Commonly known as:  ARMOUR  Take 120 mg by mouth daily before breakfast.        Disposition and follow-up:   [name] was discharged from [facility] in Good condition.  At the hospital follow up visit please address:  1.  Please discuss Insulin regimen with [name]. Very stubborn about insulin use and REFUSED to use Novolog. [name] discharged on 32 units Lantus + 6 units Regular Insulin TID wc (discussed with pharmacy, conversion ratio of Novolog:Regular is 1:1). [name] is usually on 54 units Lantus + 14 units Regular bid wc with frequent lows. His sugars after discharge most likely will be high, but discussed with [name] and agreed that this is safer for him than low sugars. Please adjust accordingly after review of home glucometer readings.  2.  Labs / imaging needed at time of follow-up: BMP; [name]  w/ AKI in hospital.   3.  Pending labs/ test needing follow-up: none  Follow-up Appointments: Follow-up Information   Follow up with Lyndon Code, MD In 1 week. (Dr. Welton Flakes will call you with appointment time.)    Specialty:  Internal Medicine   Contact information:   [address] [place] [address] [phone]       Discharge Instructions: Discharge Orders   Future Orders Complete By Expires   CSF culture  08/15/2013 08/14/2014   Gram stain (required with CSF Culture)  08/15/2013 08/14/2014   Call MD for:  difficulty breathing, headache or visual disturbances  As directed    Call MD for:  extreme fatigue  As directed    Call MD for:  persistant dizziness or light-headedness  As directed    Call MD for:  persistant nausea and vomiting  As directed    CSF cell count with  differential  As directed 08/14/2014   Diet - low sodium heart healthy  As directed    Glucose, CSF  As directed 08/14/2014   Increase activity slowly  As directed    Protein, CSF  As directed 08/14/2014      Consultations:  neurology  Procedures Performed:  Dg Chest 2 View  08/16/2013   CLINICAL DATA:  Bacteremia with ending in etiology.  EXAM: CHEST  2 VIEW  COMPARISON:  08/14/2013  FINDINGS: Lateral view is limited by [name] expiration. Cardiac silhouette is enlarged. Mild left basilar opacity. No pleural effusion or pneumothorax. There are 2 compression fractures of upper to mid thoracic vertebrae.  IMPRESSION: 1. Enlarged cardiac silhouette, which may represent cardiomegaly versus pericardial effusion.  2. Mild left basilar opacity, likely atelectasis.  3. Two thoracic compression fractures, indeterminate age. Clinical correlation is recommended.   Electronically Signed   By: Jerene Dilling M.D.   On: 08/16/2013 07:56   Ct Head Wo Contrast  08/14/2013   CLINICAL DATA:  Altered mental status. Hypoglycemia.  EXAM: CT HEAD WITHOUT CONTRAST  TECHNIQUE: Contiguous axial images were obtained from the base of the skull through the vertex without intravenous contrast.  COMPARISON:  MRI brain 12/22/2008. Head CT 02/29/2010.  FINDINGS: [name] was scanned in the right lateral decubitus position due to condition. Reformatted images were obtained. There is no evidence of acute intracranial hemorrhage, mass lesion, brain edema or extra-axial fluid collection. Mild atrophy and mild chronic small vessel ischemic changes are stable. There is no evidence of cortical based infarct or hydrocephalus.  The visualized paranasal sinuses, mastoid air cells and middle ears are clear. The calvarium is intact.  IMPRESSION: No acute intracranial findings. Stable atrophy and chronic small vessel ischemic changes.   Electronically Signed   By: Roxy Horseman M.D.   On: 08/14/2013 09:23   US Renal  08/16/2013    CLINICAL DATA:  Elevated creatinine  EXAM: RENAL/URINARY TRACT ULTRASOUND COMPLETE  COMPARISON:  None.  FINDINGS: Right Kidney  Length: 11.8 cm in length. Echogenicity within normal limits. No mass or hydronephrosis visualized.  Left Kidney  Length: 13.1 cm in length. Echogenicity within normal limits. No mass or hydronephrosis visualized.  Bladder  Within normal limits. Bladder volume is 107 cc.  IMPRESSION: No evidence of hydronephrosis. No acute pathology.   Electronically Signed   By: Maryclare Bean M.D.   On: 08/16/2013 20:48   Dg Chest Port 1 View  08/14/2013   CLINICAL DATA:  Altered mental status.  EXAM: PORTABLE CHEST - 1 VIEW  COMPARISON:  None.  FINDINGS: No  focal opacity or pulmonary edema. No effusion or pneumothorax. Cardiomegaly. Mediastinal contours distorted by rightward rotation. No acute osseous findings.  IMPRESSION: 1. No definitive acute cardiopulmonary disease. 2. Cardiomegaly. 3. When clinically able, repeat examination could evaluate the mediastinum - which is distorted by rotation.   Electronically Signed   By: Tiburcio Pea M.D.   On: 08/14/2013 05:44    2D Echo: 08/18/13  Study Conclusions: - Left ventricle: The cavity size was normal. Wall thickness was increased in a pattern of mild LVH. Systolic function was normal. The estimated ejection fraction was in the range of 60% to 65%. Wall motion was normal; there were no regional wall motion abnormalities. Features are consistent with a pseudonormal left ventricular filling pattern, with concomitant abnormal relaxation and increased filling pressure (grade 2 diastolic dysfunction). - Left atrium: The atrium was mildly dilated. - Right atrium: The atrium was mildly dilated. - Tricuspid valve: Trivial regurgitation. - Pulmonary arteries: Systolic pressure could not be accurately estimated. - Inferior vena cava: Not visualized. Unable to estimate CVP. - Pericardium, extracardiac: There was no  pericardial effusion.  Impressions: - No prior study for comparison. Mild LVH with LVEF 60-65%, grade 2 diastolic dysfunction. Mild left and right atrial enlargement. Trivial tricuspid regurgitation. Unable to calculate PASP. No obvious valvular vegetations.  Admission HPI:  [name] is a 72 year old male with past medical history of DM-II, hypertension, hyperlipidemia, thyroid dysfunction, who presents with altered mental status.  History was obtained from [name]'s wife. Per his wife, [name] has been doing well recently. He has been physically very active, doing a lot of yard works at home. [name] was last known normal at 2:00 p.m yesterday, when his wife called [name] from work and had a normal conversation with [name]. [name] was found to have altered mental status by his wife when she came home from work at about 10:00 PM. Per his wife, [name] was sleeping in a recliner and was not responsive to any questions. He was found to have soiled himself with odor over his body. His wife thought that [name] might have had low blood sugar. She tried to measure his blood sugar level without success since [name] was agitated and did not allow her to do it. She gave him 3 glucose tablets. [name] followed his wife's command and took the glucose tablets without any choking. Then he also ate some food. After that, he was more responsive, but still could not answer questions appropriately. He answered "No" to all questions asked by his wife. Of note, [name] was found to have sweating and agitation per his wife, but no weakness in his extremities or facial droopy. Later on, [name] went to the bathroom with the help of his wife. He was still confused and urinated on the floor. He had two minor falls. His wife called EMS and he was brought to the ED by EMS. His CBG was 245 at 3:30 AM in ED. [name] received one dose of Ziprasidone and ativan due to agitation. He was deeply sleeping when I saw [name] in ED.  His bp was 115/57 mmHg. His initial body temperature was 100.3 initially, the repeated measurement of the body temperature was 97.7. [name] received 1 dose of IV vancomycin and meropenem after blood culture was drawn in ED. By the time I completed interviewing his wife and my physical examination, [name] woke up with confusion. He recognized his wife and said his own name right.  Hospital Course by problem list:   1. Acute Encephalopathy - [name]  presented to ED accompanied by wife Bonita Quin) who found him confused/somnolent at home w/ urinary and fecal incontinence. The wife comments that he has had frequent issues w/ hypoglycemia in the past and presented in a similar way. When she found him at home, she attempted to check his blood sugar but was not able to d/t significant agitation. She was able to get him to take 3 glucose tablets + some food at home which she claims made him slightly more responsive. The [name] also had some associated diaphoresis, and agitation, but no weakness or facial droop. On arrival to the ED, the [name] was found to have a CBG of 245 (@ 3:30 AM), BP of 115/57, and initial temperature of 100.3. He was given Ziprazidone + Ativan for significant agitation. The [name] was given one dose of Vanco + Meropenem in the ED after 2 sets of blood cultures were drawn. He was admitted to stepdown, monitored on telemetry, placed on fall precautions and started on NS @ 100 ml/hr. Given the nature of the [name]'s AMS, neurology was also consulted, and MRI, LP, and EEG were considered. On day 2 of admission, the [name] was found to have 2 +ve blood cultures for gram +ve cocci in clusters. The LP was not performed as the [name] was thought to have bacteremia at that point. He was started on Vancomycin IV and transferred out of the stepdown unit as his AMS had improved. EEG was still performed and showed "moderately severe continuous nonspecific generalized slowing of cerebral activity, which  can be seen with a wide variety of encephalopathic processes w/ no evidence of an epileptic disorder was demonstrated". ECHO was also performed as a site of infection was never found, therefore infectious endocarditis could not be ruled out. ECHO results did not reveal any findings of vegetations or possible signs of endocarditis. Initial blood cultures were later found to be CoNS, therefore after discussion with ID, were thought to be contaminants. All other blood cultures were found to be -ve. Vanco was stopped and after further discussion with the [name] and his wife, it was found that [name] frequently has VERY low blood sugars at home, therefore hypoglycemia was considered to be the most likely cause of the [name]'s encephalopathy. On the day of discharge, the [name] was back at his baseline, able to answer all questions regarding his mental status.  2. Type II DM - As discussed above, the most likely etiology of the [name]'s AMS is hypoglycemia. After review of his blood sugars at home, he frequently has sugars in the 60's and 50's. He uses 54 units of Lantus in the AM + 14 units of Regular Insulin bid with meals. Given his lack of appetite, the [name] was started on only 28 units of Lantus + ISS. As the [name] started to feel better, his Lantus dose was increased to 32 as he had high blood sugars. On the day of discharge, he had been on Lantus 32 units + 6 units of Novolog with meals with adequate control, blood sugars in the mid 100's. This regimen was discussed with the [name] and he absolutely REFUSED to use Novolog at home or change his insulin regimen at all, as these sugars were "what worked for him", despite lengthy counseling about the dangers of low blood sugars. After some time, it was agreed that he would be discharged on Lantus 32 units in the AM + Regular Insulin 6 units tid wc. Discussed this regimen with pharmacy and the ratio of  Regular:Novolog is 1:1. The [name] finally agreed  that it was safer at this time for his sugars to be higher rather than lower and he can readjust his insulin regimen at his follow-up with his PCP.  3. HTN - Blood pressure was well controlled during admission. Continued his home medications, lotensin 40 mg daily, hydrochlorothiazide 25 mg daily.  4. HLD - Continued home Lipitor 40 mg daily.  5. Thyroid dysfunction - Diagnosed w/ Grave's Disease in 2010. Obviously converted to hypothyroidism since [name] is currently on ARMOUR at home. TSH, free T4/T3 were found to be wnl.  There was an initial possibility of severe hypothyroidism causing AMS. Continued Armour.  Discharge Vitals:   BP 121/70  Pulse 70  Temp(Src) 97.1 F (36.2 C) (Oral)  Resp 20  Ht 6\' 1"  (1.854 m)  Wt 222 lb 6.4 oz (100.88 kg)  BMI 29.35 kg/m2  SpO2 95%  Discharge Labs:  No results found for this or any previous visit (from the past 24 hour(s)).  Signed: Courtney Paris, MD 08/25/2013, 3:35 PM   Time Spent on Discharge: 35 minutes Services Ordered on Discharge: none Equipment Ordered on Discharge: none

## 2013-08-22 ENCOUNTER — Telehealth: Payer: Self-pay | Admitting: Dietician

## 2013-08-22 NOTE — Telephone Encounter (Signed)
Discharge date:08-14-13 Call date: 08-21-13 Hospital follow up appointment date: has appointment with Dr. Welton Flakes 11 am   Calling to assist with transition of care from hospital to home. Patient says he did not have any new prescriptions to fill. Patient does have and plans to attend his hospital follow up appointment.   Other problems/concerns: patient denies

## 2013-08-24 LAB — CULTURE, BLOOD (ROUTINE X 2): Culture: NO GROWTH

## 2013-08-26 NOTE — Discharge Summary (Signed)
  Date: 08/26/2013  Patient name: [name]  Medical record number: [id number]  Date of birth: November 20, 1940   This patient has been discussed with the house staff. Please see their note for complete details. I concur with their findings and plan.  [name], DO, FACP [facility] 08/26/2013, 9:10 PM

## 2014-03-18 ENCOUNTER — Emergency Department (HOSPITAL_COMMUNITY): Payer: Medicare HMO

## 2014-03-18 ENCOUNTER — Encounter (HOSPITAL_COMMUNITY): Payer: Self-pay | Admitting: Emergency Medicine

## 2014-03-18 ENCOUNTER — Inpatient Hospital Stay (HOSPITAL_COMMUNITY)
Admission: EM | Admit: 2014-03-18 | Discharge: 2014-03-21 | DRG: 638 | Disposition: A | Discharge status: Skilled Nursing Facility | Payer: Medicare HMO | Attending: Internal Medicine | Admitting: Internal Medicine

## 2014-03-18 DIAGNOSIS — M1990 Unspecified osteoarthritis, unspecified site: Secondary | ICD-10-CM

## 2014-03-18 DIAGNOSIS — G9340 Encephalopathy, unspecified: Secondary | ICD-10-CM

## 2014-03-18 DIAGNOSIS — S3282XA Multiple fractures of pelvis without disruption of pelvic ring, initial encounter for closed fracture: Secondary | ICD-10-CM | POA: Diagnosis present

## 2014-03-18 DIAGNOSIS — E119 Type 2 diabetes mellitus without complications: Secondary | ICD-10-CM

## 2014-03-18 DIAGNOSIS — E1165 Type 2 diabetes mellitus with hyperglycemia: Principal | ICD-10-CM | POA: Diagnosis present

## 2014-03-18 DIAGNOSIS — IMO0002 Reserved for concepts with insufficient information to code with codable children: Principal | ICD-10-CM | POA: Diagnosis present

## 2014-03-18 DIAGNOSIS — I10 Essential (primary) hypertension: Secondary | ICD-10-CM

## 2014-03-18 DIAGNOSIS — W19XXXA Unspecified fall, initial encounter: Secondary | ICD-10-CM | POA: Diagnosis present

## 2014-03-18 DIAGNOSIS — D649 Anemia, unspecified: Secondary | ICD-10-CM | POA: Diagnosis present

## 2014-03-18 DIAGNOSIS — Z7982 Long term (current) use of aspirin: Secondary | ICD-10-CM

## 2014-03-18 DIAGNOSIS — I129 Hypertensive chronic kidney disease with stage 1 through stage 4 chronic kidney disease, or unspecified chronic kidney disease: Secondary | ICD-10-CM | POA: Diagnosis present

## 2014-03-18 DIAGNOSIS — Y92009 Unspecified place in unspecified non-institutional (private) residence as the place of occurrence of the external cause: Secondary | ICD-10-CM

## 2014-03-18 DIAGNOSIS — Z860100 Personal history of colon polyps, unspecified: Secondary | ICD-10-CM

## 2014-03-18 DIAGNOSIS — E785 Hyperlipidemia, unspecified: Secondary | ICD-10-CM

## 2014-03-18 DIAGNOSIS — L02419 Cutaneous abscess of limb, unspecified: Secondary | ICD-10-CM

## 2014-03-18 DIAGNOSIS — N179 Acute kidney failure, unspecified: Secondary | ICD-10-CM

## 2014-03-18 DIAGNOSIS — Z8739 Personal history of other diseases of the musculoskeletal system and connective tissue: Secondary | ICD-10-CM

## 2014-03-18 DIAGNOSIS — N183 Chronic kidney disease, stage 3 unspecified: Secondary | ICD-10-CM

## 2014-03-18 DIAGNOSIS — I152 Hypertension secondary to endocrine disorders: Secondary | ICD-10-CM | POA: Diagnosis present

## 2014-03-18 DIAGNOSIS — Z794 Long term (current) use of insulin: Secondary | ICD-10-CM

## 2014-03-18 DIAGNOSIS — M2682 Posterior soft tissue impingement: Secondary | ICD-10-CM

## 2014-03-18 DIAGNOSIS — Z87891 Personal history of nicotine dependence: Secondary | ICD-10-CM

## 2014-03-18 DIAGNOSIS — S329XXA Fracture of unspecified parts of lumbosacral spine and pelvis, initial encounter for closed fracture: Secondary | ICD-10-CM

## 2014-03-18 DIAGNOSIS — Y9301 Activity, walking, marching and hiking: Secondary | ICD-10-CM

## 2014-03-18 DIAGNOSIS — H269 Unspecified cataract: Secondary | ICD-10-CM

## 2014-03-18 DIAGNOSIS — E1310 Other specified diabetes mellitus with ketoacidosis without coma: Secondary | ICD-10-CM | POA: Diagnosis not present

## 2014-03-18 DIAGNOSIS — L03119 Cellulitis of unspecified part of limb: Secondary | ICD-10-CM

## 2014-03-18 DIAGNOSIS — E1169 Type 2 diabetes mellitus with other specified complication: Principal | ICD-10-CM | POA: Diagnosis present

## 2014-03-18 DIAGNOSIS — S82899A Other fracture of unspecified lower leg, initial encounter for closed fracture: Secondary | ICD-10-CM

## 2014-03-18 DIAGNOSIS — Z79899 Other long term (current) drug therapy: Secondary | ICD-10-CM

## 2014-03-18 DIAGNOSIS — E162 Hypoglycemia, unspecified: Secondary | ICD-10-CM

## 2014-03-18 DIAGNOSIS — S32509A Unspecified fracture of unspecified pubis, initial encounter for closed fracture: Secondary | ICD-10-CM | POA: Diagnosis present

## 2014-03-18 DIAGNOSIS — R55 Syncope and collapse: Secondary | ICD-10-CM

## 2014-03-18 DIAGNOSIS — K5730 Diverticulosis of large intestine without perforation or abscess without bleeding: Secondary | ICD-10-CM

## 2014-03-18 DIAGNOSIS — E039 Hypothyroidism, unspecified: Secondary | ICD-10-CM | POA: Diagnosis present

## 2014-03-18 DIAGNOSIS — E875 Hyperkalemia: Secondary | ICD-10-CM | POA: Diagnosis not present

## 2014-03-18 DIAGNOSIS — B351 Tinea unguium: Secondary | ICD-10-CM

## 2014-03-18 HISTORY — DX: Pure hypercholesterolemia, unspecified: E78.00

## 2014-03-18 HISTORY — DX: Fracture of unspecified parts of lumbosacral spine and pelvis, initial encounter for closed fracture: S32.9XXA

## 2014-03-18 HISTORY — DX: Gastro-esophageal reflux disease without esophagitis: K21.9

## 2014-03-18 HISTORY — DX: Type 2 diabetes mellitus without complications: E11.9

## 2014-03-18 HISTORY — DX: Contact with and (suspected) exposure to other hazardous, chiefly nonmedicinal, chemicals: Z77.098

## 2014-03-18 HISTORY — DX: Hypothyroidism, unspecified: E03.9

## 2014-03-18 LAB — CBC WITH DIFFERENTIAL/PLATELET
Basophils Absolute: 0 10*3/uL (ref 0.0–0.1)
Basophils Relative: 0 % (ref 0–1)
EOS ABS: 0 10*3/uL (ref 0.0–0.7)
EOS PCT: 0 % (ref 0–5)
HEMATOCRIT: 38.8 % — AB (ref 39.0–52.0)
Hemoglobin: 12.8 g/dL — ABNORMAL LOW (ref 13.0–17.0)
LYMPHS PCT: 20 % (ref 12–46)
Lymphs Abs: 2.1 10*3/uL (ref 0.7–4.0)
MCH: 30 pg (ref 26.0–34.0)
MCHC: 33 g/dL (ref 30.0–36.0)
MCV: 91.1 fL (ref 78.0–100.0)
MONO ABS: 0.5 10*3/uL (ref 0.1–1.0)
Monocytes Relative: 5 % (ref 3–12)
Neutro Abs: 7.8 10*3/uL — ABNORMAL HIGH (ref 1.7–7.7)
Neutrophils Relative %: 75 % (ref 43–77)
PLATELETS: 176 10*3/uL (ref 150–400)
RBC: 4.26 MIL/uL (ref 4.22–5.81)
RDW: 15 % (ref 11.5–15.5)
WBC: 10.5 10*3/uL (ref 4.0–10.5)

## 2014-03-18 LAB — BASIC METABOLIC PANEL
BUN: 16 mg/dL (ref 6–23)
CALCIUM: 8.8 mg/dL (ref 8.4–10.5)
CO2: 24 meq/L (ref 19–32)
CREATININE: 0.9 mg/dL (ref 0.50–1.35)
Chloride: 106 mEq/L (ref 96–112)
GFR calc Af Amer: >90 mL/min (ref >90)
GFR, EST NON AFRICAN AMERICAN: 82 mL/min — AB (ref >90)
GLUCOSE: 204 mg/dL — AB (ref 70–99)
Potassium: 4.2 mEq/L (ref 3.7–5.3)
SODIUM: 144 meq/L (ref 137–147)

## 2014-03-18 LAB — CBG MONITORING, ED
GLUCOSE-CAPILLARY: 123 mg/dL — AB (ref 70–99)
Glucose-Capillary: 194 mg/dL — ABNORMAL HIGH (ref 70–99)
Glucose-Capillary: 82 mg/dL (ref 70–99)

## 2014-03-18 LAB — PROTIME-INR
INR: 0.93 (ref 0.00–1.49)
Prothrombin Time: 12.3 seconds (ref 11.6–15.2)

## 2014-03-18 LAB — I-STAT TROPONIN, ED: Troponin i, poc: 0.01 ng/mL (ref 0.00–0.08)

## 2014-03-18 MED ORDER — DEXTROSE-NACL 5-0.9 % IV SOLN
INTRAVENOUS | Status: DC
  Administered 2014-03-18: via INTRAVENOUS

## 2014-03-18 MED ORDER — MORPHINE SULFATE 4 MG/ML IJ SOLN
4.0000 mg | Freq: Once | INTRAMUSCULAR | Status: AC
  Administered 2014-03-18: 4 mg via INTRAVENOUS
  Filled 2014-03-18: qty 1

## 2014-03-18 MED ORDER — VITAMIN B-6 100 MG PO TABS
100.0000 mg | ORAL_TABLET | Freq: Every day | ORAL | Status: DC
  Administered 2014-03-19 – 2014-03-21 (×3): 100 mg via ORAL
  Filled 2014-03-18 (×3): qty 1

## 2014-03-18 MED ORDER — ONDANSETRON HCL 4 MG/2ML IJ SOLN: 4.0000 mg | Freq: Four times a day (QID) | INTRAMUSCULAR | Status: DC | PRN

## 2014-03-18 MED ORDER — THYROID 120 MG PO TABS
120.0000 mg | ORAL_TABLET | Freq: Every day | ORAL | Status: DC
  Administered 2014-03-19 – 2014-03-21 (×3): 120 mg via ORAL
  Filled 2014-03-18 (×4): qty 1

## 2014-03-18 MED ORDER — HYDROCHLOROTHIAZIDE 25 MG PO TABS
25.0000 mg | ORAL_TABLET | Freq: Every day | ORAL | Status: DC
  Administered 2014-03-19 – 2014-03-20 (×2): 25 mg via ORAL
  Filled 2014-03-18 (×2): qty 1

## 2014-03-18 MED ORDER — ACETAMINOPHEN 650 MG RE SUPP: 650.0000 mg | Freq: Four times a day (QID) | RECTAL | Status: DC | PRN

## 2014-03-18 MED ORDER — OXYCODONE HCL ER 10 MG PO T12A
10.0000 mg | EXTENDED_RELEASE_TABLET | Freq: Two times a day (BID) | ORAL | Status: DC
  Administered 2014-03-18 – 2014-03-21 (×6): 10 mg via ORAL
  Filled 2014-03-18 (×6): qty 1

## 2014-03-18 MED ORDER — OMEGA-3-ACID ETHYL ESTERS 1 G PO CAPS
1.0000 g | ORAL_CAPSULE | Freq: Every day | ORAL | Status: DC
  Administered 2014-03-19 – 2014-03-21 (×3): 1 g via ORAL
  Filled 2014-03-18 (×3): qty 1

## 2014-03-18 MED ORDER — ACETAMINOPHEN 325 MG PO TABS: 650.0000 mg | ORAL_TABLET | Freq: Four times a day (QID) | ORAL | Status: DC | PRN

## 2014-03-18 MED ORDER — FISH OIL 1200 MG PO CAPS
1.0000 | ORAL_CAPSULE | Freq: Every day | ORAL | Status: DC
Start: 2014-03-19 — End: 2014-03-18

## 2014-03-18 MED ORDER — SODIUM CHLORIDE 0.9 % IV BOLUS (SEPSIS)
1000.0000 mL | Freq: Once | INTRAVENOUS | Status: AC
  Administered 2014-03-18: 1000 mL via INTRAVENOUS

## 2014-03-18 MED ORDER — BENAZEPRIL HCL 40 MG PO TABS
40.0000 mg | ORAL_TABLET | Freq: Every day | ORAL | Status: DC
  Administered 2014-03-19: 40 mg via ORAL
  Filled 2014-03-18 (×2): qty 1

## 2014-03-18 MED ORDER — HYDROMORPHONE HCL PF 1 MG/ML IJ SOLN: 1.0000 mg | INTRAMUSCULAR | Status: DC | PRN

## 2014-03-18 MED ORDER — ASPIRIN EC 81 MG PO TBEC
81.0000 mg | DELAYED_RELEASE_TABLET | Freq: Every day | ORAL | Status: DC
  Administered 2014-03-19 – 2014-03-21 (×3): 81 mg via ORAL
  Filled 2014-03-18 (×3): qty 1

## 2014-03-18 MED ORDER — ENOXAPARIN SODIUM 40 MG/0.4ML ~~LOC~~ SOLN
40.0000 mg | SUBCUTANEOUS | Status: DC
  Administered 2014-03-19 – 2014-03-20 (×3): 40 mg via SUBCUTANEOUS
  Filled 2014-03-18 (×4): qty 0.4

## 2014-03-18 MED ORDER — ATORVASTATIN CALCIUM 40 MG PO TABS
40.0000 mg | ORAL_TABLET | Freq: Every day | ORAL | Status: DC
  Administered 2014-03-19 – 2014-03-21 (×3): 40 mg via ORAL
  Filled 2014-03-18 (×3): qty 1

## 2014-03-18 MED ORDER — ONDANSETRON HCL 4 MG PO TABS: 4.0000 mg | ORAL_TABLET | Freq: Four times a day (QID) | ORAL | Status: DC | PRN

## 2014-03-18 MED ORDER — PANTOPRAZOLE SODIUM 40 MG PO TBEC
40.0000 mg | DELAYED_RELEASE_TABLET | Freq: Every day | ORAL | Status: DC
  Administered 2014-03-19 – 2014-03-21 (×3): 40 mg via ORAL
  Filled 2014-03-18 (×3): qty 1

## 2014-03-18 NOTE — ED Notes (Signed)
Attempted to ambulate pt without success. Pt unable to bear weight on left hip. Informed Dr. Clydene PughKnott.

## 2014-03-18 NOTE — ED Notes (Signed)
Patient transported to CT 

## 2014-03-18 NOTE — ED Notes (Signed)
CBG 194 

## 2014-03-18 NOTE — H&P (Signed)
[facility] History and Physical  [name] WUJ:[id number] DOB: 1941/04/20 DOA: 03/18/2014  Referring physician: ER physician. PCP: Pcp Not In System Dr. Rande Lawman.  Chief Complaint: Hypoglycemia and pelvic fracture.  HPI: [name] is a 73 y.o. male with history of diabetes mellitus, hypertension, hyperlipidemia, osteoarthritis, hypothyroidism had a syncopal episode today and fell at his house. Patient usually wakes up at 3:30 AM and takes his Lantus insulin. At around 1:30 PM usually patient has is upper and was walking towards his first-floor from basement when suddenly he lost consciousness. His wife who found him later on the floor found the patient to be lethargic. Checked patient's blood sugar and was found to be 29. Was given some juice after which patient which improved. Patient found it painful to move his lower extremity and was brought to the ER. Patient had initial x-rays which did not show any acute but chest pain was persistent CT pelvis was done which showed fracture of the pelvic bone and sacrum. Patient still mildly hypoglycemic for which D5 normal saline has been started. Patient has been admitted for further workup. Denies any chest pain shortness of breath palpitations nausea vomiting abdominal pain. Patient's Lantus dose was recently increased by patient's primary care physician 2 weeks ago from 52 units to 58 units.  Review of Systems: As presented in the history of presenting illness, rest negative.  Past Medical History  Diagnosis Date  . Arthritis   . Diabetes mellitus without complication   . Hypertension   . Encephalopathy acute 08/16/2013   Past Surgical History  Procedure Laterality Date  . Knee surgery     Social History:  reports that he has quit smoking. He has never used smokeless tobacco. He reports that he does not drink alcohol or use illicit drugs. Where does patient live home. Can patient participate in ADLs? Yes.  Allergies   Allergen Reactions  . Penicillins Rash    Family History: History reviewed. No pertinent family history.    Prior to Admission medications   Medication Sig Start Date End Date Taking? Authorizing Provider  aspirin EC 81 MG tablet Take 81 mg by mouth daily.   Yes Historical Provider, MD  atorvastatin (LIPITOR) 40 MG tablet Take 40 mg by mouth daily.   Yes Historical Provider, MD  benazepril (LOTENSIN) 40 MG tablet Take 40 mg by mouth daily.   Yes Historical Provider, MD  diclofenac (VOLTAREN) 50 MG EC tablet Take 100 mg by mouth daily.   Yes Historical Provider, MD  hydrochlorothiazide (HYDRODIURIL) 25 MG tablet Take 25 mg by mouth daily.   Yes Historical Provider, MD  HYDROcodone-acetaminophen (NORCO) 7.5-325 MG per tablet 1 tablet every 6 (six) hours as needed for pain.  08/07/13  Yes Historical Provider, MD  ibuprofen (ADVIL,MOTRIN) 400 MG tablet Take 400 mg by mouth every evening.   Yes Historical Provider, MD  insulin glargine (LANTUS) 100 UNIT/ML injection Inject 58 Units into the skin every morning. 08/20/13  Yes Courtney Paris, MD  insulin regular (NOVOLIN R) 100 units/mL injection Inject 14 Units into the skin 2 (two) times daily before a meal.    Yes Historical Provider, MD  Omega-3 Fatty Acids (FISH OIL) 1200 MG CAPS Take 1 capsule by mouth daily.   Yes Historical Provider, MD  OXYCONTIN 10 MG T12A 12 hr tablet Take 10 mg by mouth every 12 (twelve) hours.  07/29/13  Yes Historical Provider, MD  pantoprazole (PROTONIX) 40 MG tablet Take 40 mg by mouth  daily.   Yes Historical Provider, MD  pyridOXINE (VITAMIN B-6) 100 MG tablet Take 100 mg by mouth daily.   Yes Historical Provider, MD  thyroid (ARMOUR) 120 MG tablet Take 120 mg by mouth daily before breakfast.   Yes Historical Provider, MD    Physical Exam: Filed Vitals:   03/18/14 1815 03/18/14 1845 03/18/14 1945 03/18/14 2153  BP: 136/76 132/72 156/74 151/68  Pulse: 105 101 106   Temp:   98.5 F (36.9 C)   TempSrc:   Oral    Resp: 21 23 20 15   SpO2: 97% 95% 97% 95%     General:  Well-developed and nourished.  Eyes: Anicteric no pallor.  ENT: No discharge from the ears eyes nose mouth.  Neck: No mass felt.  Cardiovascular: S1-S2 heard.  Respiratory: No rhonchi or crepitations.  Abdomen: Soft nontender bowel sounds present. No guarding or rigidity.  Skin: No rash.  Musculoskeletal: Pain on moving the left hip.  Psychiatric: Appears normal.  Neurologic: Alert awake oriented to time place and person. Moves all extremities.  Labs on Admission:  Basic Metabolic Panel:  Recent Labs Lab 03/18/14 1605  NA 144  K 4.2  CL 106  CO2 24  GLUCOSE 204*  BUN 16  CREATININE 0.90  CALCIUM 8.8   Liver Function Tests: No results found for this basename: AST, ALT, ALKPHOS, BILITOT, PROT, ALBUMIN,  in the last 168 hours No results found for this basename: LIPASE, AMYLASE,  in the last 168 hours No results found for this basename: AMMONIA,  in the last 168 hours CBC:  Recent Labs Lab 03/18/14 1605  WBC 10.5  NEUTROABS 7.8*  HGB 12.8*  HCT 38.8*  MCV 91.1  PLT 176   Cardiac Enzymes: No results found for this basename: CKTOTAL, CKMB, CKMBINDEX, TROPONINI,  in the last 168 hours  BNP (last 3 results) No results found for this basename: PROBNP,  in the last 8760 hours CBG:  Recent Labs Lab 03/18/14 1619 03/18/14 1905 03/18/14 2156  GLUCAP 194* 123* 82    Radiological Exams on Admission: Dg Chest 2 View  03/18/2014   CLINICAL DATA:  Hypoglycemia, syncope, diabetes, hypertension  EXAM: CHEST  2 VIEW  COMPARISON:  08/15/2013  FINDINGS: Minimal enlargement of cardiac silhouette.  Mediastinal contours and pulmonary vascularity normal.  Lungs clear.  No pleural effusion or pneumothorax.  Bones demineralized.  IMPRESSION: Minimal enlargement of cardiac silhouette.  No acute abnormalities.   Electronically Signed   By: Ulyses SouthwardMark  Boles M.D.   On: 03/18/2014 17:51   Dg Hip Complete Left  03/18/2014    CLINICAL DATA:  LEFT hip pain, syncope, pain down LEFT femur, hypoglycemia  EXAM: LEFT HIP - COMPLETE 2+ VIEW  COMPARISON:  None  FINDINGS: Bones appear demineralized.  Hip and SI joint spaces preserved.  No definite acute fracture, dislocation, or bone destruction.  IMPRESSION: No acute osseous abnormalities.   Electronically Signed   By: Ulyses SouthwardMark  Boles M.D.   On: 03/18/2014 17:50   Ct Pelvis Wo Contrast  03/18/2014   CLINICAL DATA:  Left hip pain since a fall today.  EXAM: CT PELVIS WITHOUT CONTRAST  TECHNIQUE: Multidetector CT imaging of the pelvis was performed following the standard protocol without intravenous contrast.  COMPARISON:  Radiographs dated 03/18/2014  FINDINGS: There are nondisplaced fractures of the inferior and superior pubic rami. There is a subtle fracture of the superior cortex of the superior pubic ramus adjacent to the left pubic body as well as a nondisplaced hairline  fracture of superior pubic ramus at the junction with the acetabulum.  There is a very subtle impaction fracture of the left side of the sacrum.  The proximal left femur is intact.  No other acute osseous abnormality. Joint space narrowing of both hips. Incidental note is made of multiple diverticula in the distal colon.  IMPRESSION: 1. Fractures of the left inferior and superior pubic rami and left sacral ala. 2. Intact  proximal left femur.   Electronically Signed   By: Geanie CooleyJim  Maxwell M.D.   On: 03/18/2014 20:53    EKG: Independently reviewed. Sinus tachycardia with PVCs.  Assessment/Plan Active Problems:   DIABETES MELLITUS, TYPE II   HYPERLIPIDEMIA   HYPERTENSION   Multiple closed fractures of pelvis   Hypoglycemia   Pelvis fracture   1. Hypoglycemia - at this time we will continue with D5 normal saline and hold Lantus for now. If patient's blood sugar improves then discontinue IV fluids and consider starting Lantus at a low dose. 2. Pelvic fracture - patient has been placed on pain relief medications and  physical therapy consult. May consult with orthopedic surgeon in a.m. 3. Hypertension - continue home medications. 4. Hyperlipidemia - continue statins. 5. Hypothyroidism - on Synthroid. 6. Mild anemia - follow CBC and if there is no significant fall in hemoglobin further workup as outpatient.    Code Status: Full code.  Family Communication: Patient's wife.  Disposition Plan: Admit to inpatient.    Eduard ClosArshad N Kakrakandy [facility] Pager [phone].  If 7PM-7AM, please contact night-coverage [email or web address] Password [email or web address] 03/18/2014, 10:30 PM

## 2014-03-18 NOTE — ED Notes (Signed)
Kakrakandy, MD at bedside 

## 2014-03-18 NOTE — ED Provider Notes (Signed)
CSN: [id number]     Arrival date & time 03/18/14  1533 History   First MD Initiated Contact with Patient 03/18/14 1554     Chief Complaint  Patient presents with  . Hypoglycemia     (Consider location/radiation/quality/duration/timing/severity/associated sxs/prior Treatment) Patient is a 73 y.o. male presenting with fall. The history is provided by the patient.  Fall This is a new problem. The current episode started today. The problem has been unchanged. Associated symptoms include fatigue (after missing a meal). Pertinent negatives include no fever. Associated symptoms comments: Left hip pain. Nothing aggravates the symptoms. He has tried nothing for the symptoms. The treatment provided no relief.    Past Medical History  Diagnosis Date  . Arthritis   . Diabetes mellitus without complication   . Hypertension   . Encephalopathy acute 08/16/2013   History reviewed. No pertinent past surgical history. No family history on file. History  Substance Use Topics  . Smoking status: Former Games developer  . Smokeless tobacco: Never Used  . Alcohol Use: No    Review of Systems  Constitutional: Positive for fatigue (after missing a meal). Negative for fever.  Musculoskeletal:       Left lateral hip pain  All other systems reviewed and are negative.     Allergies  Penicillins  Home Medications   Prior to Admission medications   Medication Sig Start Date End Date Taking? Authorizing Provider  aspirin EC 81 MG tablet Take 81 mg by mouth daily.    Historical Provider, MD  atorvastatin (LIPITOR) 40 MG tablet Take 40 mg by mouth daily.    Historical Provider, MD  benazepril (LOTENSIN) 40 MG tablet Take 40 mg by mouth daily.    Historical Provider, MD  diclofenac (VOLTAREN) 50 MG EC tablet Take 100 mg by mouth daily.    Historical Provider, MD  hydrochlorothiazide (HYDRODIURIL) 25 MG tablet Take 25 mg by mouth daily.    Historical Provider, MD  HYDROcodone-acetaminophen (NORCO) 7.5-325 MG  per tablet 1 tablet every 6 (six) hours as needed for pain.  08/07/13   Historical Provider, MD  insulin glargine (LANTUS) 100 UNIT/ML injection Inject 0.32 mLs (32 Units total) into the skin every morning. 08/20/13   Courtney Paris, MD  insulin regular (HUMULIN R) 100 units/mL injection Inject 0.06 mLs (6 Units total) into the skin 3 (three) times daily before meals. 08/20/13   Courtney Paris, MD  Omega-3 Fatty Acids (FISH OIL) 1200 MG CAPS Take 1 capsule by mouth daily.    Historical Provider, MD  OXYCONTIN 10 MG T12A 12 hr tablet Take 10 mg by mouth every 12 (twelve) hours.  07/29/13   Historical Provider, MD  pyridOXINE (VITAMIN B-6) 100 MG tablet Take 100 mg by mouth daily.    Historical Provider, MD  Selenium 200 MCG TABS Take 1 tablet by mouth daily.    Historical Provider, MD  SPIRULINA PO Take 750 mg by mouth 3 (three) times daily.    Historical Provider, MD  thyroid (ARMOUR) 120 MG tablet Take 120 mg by mouth daily before breakfast.    Historical Provider, MD   SpO2 96% Physical Exam  Constitutional: He is oriented to person, place, and time. He appears well-developed and well-nourished. No distress.  HENT:  Head: Normocephalic and atraumatic.  Eyes: Conjunctivae are normal.  Neck: Neck supple. No tracheal deviation present.  Cardiovascular: Normal rate, regular rhythm and normal heart sounds.   Pulmonary/Chest: Effort normal and breath sounds normal. No respiratory distress.  Abdominal:  Soft. He exhibits no distension. There is no tenderness. There is no rebound.  Musculoskeletal:       Left hip: He exhibits decreased range of motion (2/2 pain) and tenderness. He exhibits no swelling, no crepitus and no deformity.       Legs: Neurological: He is alert and oriented to person, place, and time.  Skin: Skin is warm and dry.  Psychiatric: He has a normal mood and affect.    ED Course  Procedures (including critical care time) Labs Review Labs Reviewed  CBC WITH DIFFERENTIAL -  Abnormal; Notable for the following:    Hemoglobin 12.8 (*)    HCT 38.8 (*)    Neutro Abs 7.8 (*)    All other components within normal limits  BASIC METABOLIC PANEL - Abnormal; Notable for the following:    Glucose, Bld 204 (*)    GFR calc non Af Amer 82 (*)    All other components within normal limits  CBG MONITORING, ED - Abnormal; Notable for the following:    Glucose-Capillary 194 (*)    All other components within normal limits  CBG MONITORING, ED - Abnormal; Notable for the following:    Glucose-Capillary 123 (*)    All other components within normal limits  PROTIME-INR  COMPREHENSIVE METABOLIC PANEL  CBC WITH DIFFERENTIAL  I-STAT TROPOININ, ED  CBG MONITORING, ED    Imaging Review Dg Chest 2 View  03/18/2014   CLINICAL DATA:  Hypoglycemia, syncope, diabetes, hypertension  EXAM: CHEST  2 VIEW  COMPARISON:  08/15/2013  FINDINGS: Minimal enlargement of cardiac silhouette.  Mediastinal contours and pulmonary vascularity normal.  Lungs clear.  No pleural effusion or pneumothorax.  Bones demineralized.  IMPRESSION: Minimal enlargement of cardiac silhouette.  No acute abnormalities.   Electronically Signed   By: Ulyses SouthwardMark  Boles M.D.   On: 03/18/2014 17:51   Dg Hip Complete Left  03/18/2014   CLINICAL DATA:  LEFT hip pain, syncope, pain down LEFT femur, hypoglycemia  EXAM: LEFT HIP - COMPLETE 2+ VIEW  COMPARISON:  None  FINDINGS: Bones appear demineralized.  Hip and SI joint spaces preserved.  No definite acute fracture, dislocation, or bone destruction.  IMPRESSION: No acute osseous abnormalities.   Electronically Signed   By: Ulyses SouthwardMark  Boles M.D.   On: 03/18/2014 17:50   Ct Pelvis Wo Contrast  03/18/2014   CLINICAL DATA:  Left hip pain since a fall today.  EXAM: CT PELVIS WITHOUT CONTRAST  TECHNIQUE: Multidetector CT imaging of the pelvis was performed following the standard protocol without intravenous contrast.  COMPARISON:  Radiographs dated 03/18/2014  FINDINGS: There are nondisplaced  fractures of the inferior and superior pubic rami. There is a subtle fracture of the superior cortex of the superior pubic ramus adjacent to the left pubic body as well as a nondisplaced hairline fracture of superior pubic ramus at the junction with the acetabulum.  There is a very subtle impaction fracture of the left side of the sacrum.  The proximal left femur is intact.  No other acute osseous abnormality. Joint space narrowing of both hips. Incidental note is made of multiple diverticula in the distal colon.  IMPRESSION: 1. Fractures of the left inferior and superior pubic rami and left sacral ala. 2. Intact  proximal left femur.   Electronically Signed   By: Geanie CooleyJim  Maxwell M.D.   On: 03/18/2014 20:53     EKG Interpretation   Date/Time:  Tuesday Mar 18 2014 15:47:19 EDT Ventricular Rate:  106 PR Interval:  183 QRS Duration: 102 QT Interval:  356 QTC Calculation: 473 R Axis:   -21 Text Interpretation:  Sinus tachycardia Paired ventricular premature  complexes Borderline left axis deviation No significant change since last  tracing Confirmed by YAO  MD, DAVID ([id number]) on 03/18/2014 4:29:40 PM      MDM   Final diagnoses:  Multiple closed fractures of pelvis    73 y.o. male presents with fall from standing at home after having a hypoglycemic episode related to missing a meal after administration of his typical dose of insulin. His wife found him down when she came home and his blood sugar was 29. He was slowed at that time and responded after receiving food but had left outer hip pain that had caused him to be unable to stand or ambulate at his baseline. He comes here for evaluation and is alert and oriented, he does have some ongoing left lateral hip pain that is bothering him and refuses to bear weight on that lower shortly.  Plain films of the hip or read as negative as well as the pelvis. His lab work appears reasonable and his glucose has continued to recover with oral intake of food. He  still continued to refuse ambulation due to pain after multiple rounds medications so CT of the pelvis was ordered to evaluate for occult fracture. The patient was found to have a sacral ala fracture as well as the pubic bone fracture nondisplaced will not require surgical management. He is admitted to the hospitalist service for pain management purposes and arranging for physical therapy with inpatient evaluation.    Lyndal Pulleyaniel Aneshia Jacquet, MD 03/18/14 [phone]

## 2014-03-18 NOTE — ED Notes (Signed)
Nurse [facility] updated with the change in pt's plan of care. Per Toniann FailKakrakandy, MD this RN is to give the pt a glass of orange juice and start the pt on a D5 normal saline at 50 mL/hour.

## 2014-03-18 NOTE — ED Notes (Signed)
Blood sugar is 82.

## 2014-03-18 NOTE — ED Notes (Signed)
73 yo missed a meal and felt weak around 1pm passed out. Wife checked CBG 29 gave crackers came up to 142. Denies head/neck/back but minor hip pain. Unable to bear weight no trauma, shortneing. Is stable. No blood thinners A/O Pain in hip 4/10.   Vitals 148/92 HR 107 spo2 96 Resp 16

## 2014-03-18 NOTE — ED Notes (Signed)
D5% in normal saline started at 50 mL/hour per Toniann FailKakrakandy, MD. Pt also given a glass of orange juice.

## 2014-03-18 NOTE — ED Notes (Signed)
Attempted to call report

## 2014-03-19 DIAGNOSIS — G9340 Encephalopathy, unspecified: Secondary | ICD-10-CM

## 2014-03-19 DIAGNOSIS — R55 Syncope and collapse: Secondary | ICD-10-CM

## 2014-03-19 LAB — GLUCOSE, CAPILLARY
GLUCOSE-CAPILLARY: 97 mg/dL (ref 70–99)
GLUCOSE-CAPILLARY: 300 mg/dL — AB (ref 70–99)
GLUCOSE-CAPILLARY: 359 mg/dL — AB (ref 70–99)
Glucose-Capillary: 103 mg/dL — ABNORMAL HIGH (ref 70–99)
Glucose-Capillary: 106 mg/dL — ABNORMAL HIGH (ref 70–99)
Glucose-Capillary: 124 mg/dL — ABNORMAL HIGH (ref 70–99)
Glucose-Capillary: 327 mg/dL — ABNORMAL HIGH (ref 70–99)
Glucose-Capillary: 343 mg/dL — ABNORMAL HIGH (ref 70–99)

## 2014-03-19 LAB — CBC WITH DIFFERENTIAL/PLATELET
BASOS ABS: 0 10*3/uL (ref 0.0–0.1)
Basophils Relative: 0 % (ref 0–1)
EOS PCT: 4 % (ref 0–5)
Eosinophils Absolute: 0.3 10*3/uL (ref 0.0–0.7)
HCT: 36.1 % — ABNORMAL LOW (ref 39.0–52.0)
Hemoglobin: 11.8 g/dL — ABNORMAL LOW (ref 13.0–17.0)
LYMPHS ABS: 2.3 10*3/uL (ref 0.7–4.0)
LYMPHS PCT: 34 % (ref 12–46)
MCH: 30.2 pg (ref 26.0–34.0)
MCHC: 32.7 g/dL (ref 30.0–36.0)
MCV: 92.3 fL (ref 78.0–100.0)
Monocytes Absolute: 0.6 10*3/uL (ref 0.1–1.0)
Monocytes Relative: 9 % (ref 3–12)
NEUTROS ABS: 3.6 10*3/uL (ref 1.7–7.7)
Neutrophils Relative %: 53 % (ref 43–77)
Platelets: 146 10*3/uL — ABNORMAL LOW (ref 150–400)
RBC: 3.91 MIL/uL — AB (ref 4.22–5.81)
RDW: 15.3 % (ref 11.5–15.5)
WBC: 6.7 10*3/uL (ref 4.0–10.5)

## 2014-03-19 LAB — COMPREHENSIVE METABOLIC PANEL
ALBUMIN: 3 g/dL — AB (ref 3.5–5.2)
ALK PHOS: 74 U/L (ref 39–117)
ALT: 24 U/L (ref 0–53)
AST: 26 U/L (ref 0–37)
BUN: 11 mg/dL (ref 6–23)
CO2: 21 mEq/L (ref 19–32)
CREATININE: 0.84 mg/dL (ref 0.50–1.35)
Calcium: 8.4 mg/dL (ref 8.4–10.5)
Chloride: 107 mEq/L (ref 96–112)
GFR calc non Af Amer: 84 mL/min — ABNORMAL LOW (ref >90)
GLUCOSE: 107 mg/dL — AB (ref 70–99)
Potassium: 4.2 mEq/L (ref 3.7–5.3)
Sodium: 140 mEq/L (ref 137–147)
Total Bilirubin: 0.8 mg/dL (ref 0.3–1.2)
Total Protein: 5.6 g/dL — ABNORMAL LOW (ref 6.0–8.3)

## 2014-03-19 LAB — MRSA PCR SCREENING: MRSA BY PCR: NEGATIVE

## 2014-03-19 LAB — TSH: TSH: 0.763 u[IU]/mL (ref 0.350–4.500)

## 2014-03-19 MED ORDER — INSULIN ASPART 100 UNIT/ML ~~LOC~~ SOLN: 5.0000 [IU] | Freq: Once | SUBCUTANEOUS | Status: DC

## 2014-03-19 MED ORDER — HYDROMORPHONE HCL PF 1 MG/ML IJ SOLN
2.0000 mg | INTRAMUSCULAR | Status: DC | PRN
  Administered 2014-03-19 – 2014-03-20 (×2): 2 mg via INTRAVENOUS
  Filled 2014-03-19 (×3): qty 2

## 2014-03-19 MED ORDER — INSULIN GLARGINE 100 UNIT/ML ~~LOC~~ SOLN
15.0000 [IU] | Freq: Every day | SUBCUTANEOUS | Status: DC
  Filled 2014-03-19 (×3): qty 0.15

## 2014-03-19 NOTE — Progress Notes (Signed)
Pts supper delivered, pt yelling at everyone who came by the door to tell the nurse he needs his insulin before he eats. I was standing outside in the hall and heard the pt. I ask what he was yelling over and he said "I need my insulin". I tried to explain to him that his fs was low when he came in so we wanted to go slow on the insulin. He starts raising his voice saying we are not taking care of him...etc. So i called MD to let her talk to him.

## 2014-03-19 NOTE — Progress Notes (Signed)
[facility] PROGRESS NOTE  Tony BucyDonald R Middendorf XLK:[id number] DOB: 1940/12/16 DOA: 03/18/2014 PCP: Lyndon CodeKHAN, FOZIA M, MD  Assessment/Plan: Pelvic Fractures -Following fall. -No surgical intervention required. -Minimal pain med requirements. -PT/OT evals pending; suspect may need SNF.  Syncope -Related to hypoglycemia. -No further workup. -Continue telemetry for 24 hours, then DC.  Hypoglycemia/DM II -CBGs remain marginal even on D5. -Continue D5 infusion today. -No insulin unless CBGs >300 today.  Hypothyroidism -Check TSH. -Continue armour thyroid.  Hyperlipidemia -Continue statin.  HTN -Well controlled. -Continue home medications.  Code Status: Full Code Family Communication: Wife Bonita QuinLinda at bedside updated on plan of care.  Disposition Plan: To be determined, but suspect may need SNF due to acute pelvic fractures.   Consultants:  None   Antibiotics:  None   Subjective: No complaints.  Objective: Filed Vitals:   03/18/14 2153 03/18/14 2250 03/19/14 0006 03/19/14 0530  BP: 151/68 136/68  126/76  Pulse:  102  84  Temp:  98.3 F (36.8 C)  98.1 F (36.7 C)  TempSrc:  Oral  Oral  Resp: 15 18  18   Height:   5\' 11"  (1.803 m)   Weight:   100.245 kg (221 lb) 100.472 kg (221 lb 8 oz)  SpO2: 95% 95%  93%    Intake/Output Summary (Last 24 hours) at 03/19/14 1009 Last data filed at 03/19/14 0824  Gross per 24 hour  Intake    360 ml  Output    250 ml  Net    110 ml   Filed Weights   03/19/14 0006 03/19/14 0530  Weight: 100.245 kg (221 lb) 100.472 kg (221 lb 8 oz)    Exam:   General:  AA Ox3  Cardiovascular: RRR  Respiratory: CTA B  Abdomen: S/NT/ND/+BS  Extremities: 1+edema bilaterally   Neurologic:  Non-focal  Data Reviewed: Basic Metabolic Panel:  Recent Labs Lab 03/18/14 1605 03/19/14 0411  NA 144 140  K 4.2 4.2  CL 106 107  CO2 24 21  GLUCOSE 204* 107*  BUN 16 11  CREATININE 0.90 0.84  CALCIUM 8.8 8.4   Liver  Function Tests:  Recent Labs Lab 03/19/14 0411  AST 26  ALT 24  ALKPHOS 74  BILITOT 0.8  PROT 5.6*  ALBUMIN 3.0*   No results found for this basename: LIPASE, AMYLASE,  in the last 168 hours No results found for this basename: AMMONIA,  in the last 168 hours CBC:  Recent Labs Lab 03/18/14 1605 03/19/14 0411  WBC 10.5 6.7  NEUTROABS 7.8* 3.6  HGB 12.8* 11.8*  HCT 38.8* 36.1*  MCV 91.1 92.3  PLT 176 146*   Cardiac Enzymes: No results found for this basename: CKTOTAL, CKMB, CKMBINDEX, TROPONINI,  in the last 168 hours BNP (last 3 results) No results found for this basename: PROBNP,  in the last 8760 hours CBG:  Recent Labs Lab 03/18/14 2156 03/19/14 0005 03/19/14 0223 03/19/14 0357 03/19/14 0619  GLUCAP 82 103* 97 106* 124*    Recent Results (from the past 240 hour(s))  MRSA PCR SCREENING     Status: None   Collection Time    03/18/14 11:56 PM      Result Value Ref Range Status   MRSA by PCR NEGATIVE  NEGATIVE Final   Comment:            The GeneXpert MRSA Assay (FDA     approved for NASAL specimens     only), is one component of a  comprehensive MRSA colonization     surveillance program. It is not     intended to diagnose MRSA     infection nor to guide or     monitor treatment for     MRSA infections.     Studies: Dg Chest 2 View  03/18/2014   CLINICAL DATA:  Hypoglycemia, syncope, diabetes, hypertension  EXAM: CHEST  2 VIEW  COMPARISON:  08/15/2013  FINDINGS: Minimal enlargement of cardiac silhouette.  Mediastinal contours and pulmonary vascularity normal.  Lungs clear.  No pleural effusion or pneumothorax.  Bones demineralized.  IMPRESSION: Minimal enlargement of cardiac silhouette.  No acute abnormalities.   Electronically Signed   By: Ulyses SouthwardMark  Boles M.D.   On: 03/18/2014 17:51   Dg Hip Complete Left  03/18/2014   CLINICAL DATA:  LEFT hip pain, syncope, pain down LEFT femur, hypoglycemia  EXAM: LEFT HIP - COMPLETE 2+ VIEW  COMPARISON:  None   FINDINGS: Bones appear demineralized.  Hip and SI joint spaces preserved.  No definite acute fracture, dislocation, or bone destruction.  IMPRESSION: No acute osseous abnormalities.   Electronically Signed   By: Ulyses SouthwardMark  Boles M.D.   On: 03/18/2014 17:50   Ct Pelvis Wo Contrast  03/18/2014   CLINICAL DATA:  Left hip pain since a fall today.  EXAM: CT PELVIS WITHOUT CONTRAST  TECHNIQUE: Multidetector CT imaging of the pelvis was performed following the standard protocol without intravenous contrast.  COMPARISON:  Radiographs dated 03/18/2014  FINDINGS: There are nondisplaced fractures of the inferior and superior pubic rami. There is a subtle fracture of the superior cortex of the superior pubic ramus adjacent to the left pubic body as well as a nondisplaced hairline fracture of superior pubic ramus at the junction with the acetabulum.  There is a very subtle impaction fracture of the left side of the sacrum.  The proximal left femur is intact.  No other acute osseous abnormality. Joint space narrowing of both hips. Incidental note is made of multiple diverticula in the distal colon.  IMPRESSION: 1. Fractures of the left inferior and superior pubic rami and left sacral ala. 2. Intact  proximal left femur.   Electronically Signed   By: Geanie CooleyJim  Maxwell M.D.   On: 03/18/2014 20:53    Scheduled Meds: . aspirin EC  81 mg Oral Daily  . atorvastatin  40 mg Oral Daily  . benazepril  40 mg Oral Daily  . enoxaparin (LOVENOX) injection  40 mg Subcutaneous Q24H  . hydrochlorothiazide  25 mg Oral Daily  . omega-3 acid ethyl esters  1 g Oral Daily  . OxyCODONE  10 mg Oral Q12H  . pantoprazole  40 mg Oral Daily  . pyridOXINE  100 mg Oral Daily  . thyroid  120 mg Oral QAC breakfast   Continuous Infusions: . dextrose 5 % and 0.9% NaCl 50 mL/hr at 03/18/14 2350    Principal Problem:   Pelvis fracture Active Problems:   Hypoglycemia   DIABETES MELLITUS, TYPE II   HYPERLIPIDEMIA   HYPERTENSION   Multiple closed  fractures of pelvis    Time spent: 35 minutes. Greater than 50% of this time was spent in direct contact with the patient coordinating care.    [name]  [facility] Pager [phone]  If 7PM-7AM, please contact night-coverage at [email or web address], password [email or web address] 03/19/2014, 10:09 AM  LOS: 1 day

## 2014-03-19 NOTE — ED Provider Notes (Signed)
I saw and evaluated the patient, reviewed the resident's note and I agree with the findings and plan.   EKG Interpretation   Date/Time:  Tuesday Mar 18 2014 15:47:19 EDT Ventricular Rate:  106 PR Interval:  183 QRS Duration: 102 QT Interval:  356 QTC Calculation: 473 R Axis:   -21 Text Interpretation:  Sinus tachycardia Paired ventricular premature  complexes Borderline left axis deviation No significant change since last  tracing Confirmed by Mahin Guardia  MD, Janiylah Hannis ([id number]) on 03/18/2014 4:29:40 PM      [name] is a 73 y.o. male hx of DM here with hypoglycemia, near syncope. He took his lantus and regular insulin around 4 am. Didn't eat around 1pm. He was noted to be altered and possibly passed out and his L hip. His CBG was low, given 1 amp D50. Chronically ill on exam, lungs clear, RRR. Abdomen soft. Unable to range L hip. Xrays nl, but still unable to bear weight. CT showed pelvic fracture. Will admit for rehab.    Tony Canalavid H Volanda Mangine, MD 03/19/14 [phone]

## 2014-03-19 NOTE — Progress Notes (Signed)
Physical Therapy Evaluation Patient Details Name: Tony BucyDonald R Rotert MRN: [id number] DOB: February 28, 1941 Today's Date: 03/19/2014   History of Present Illness  Tony BucyDonald R Dusenbery is a 73 y.o. male with history of diabetes mellitus, hypertension, hyperlipidemia, osteoarthritis, hypothyroidism had a syncopal episode today and fell at his house. Glucose 29 when wife checked. X-ray showed fx of sacrum and pelvis.  Clinical Impression  Pt admitted with hypoglycemia with fall and multiple pelvic fractures resulting in pain and decreased mobility. Pt currently with functional limitations due to the deficits listed below (see PT Problem List). Pt stood EOB today with mod A but was unable to put any wt on LLE and unable to hop on right to ambulate or transfer to a chair.  Pt will benefit from skilled PT to increase their independence and safety with mobility to allow discharge to the venue listed below.       Follow Up Recommendations SNF;Supervision/Assistance - 24 hour    Equipment Recommendations  Rolling walker with 5" wheels    Recommendations for Other Services OT consult     Precautions / Restrictions Precautions Precautions: Fall Restrictions Weight Bearing Restrictions: No      Mobility  Bed Mobility Overal bed mobility: Needs Assistance Bed Mobility: Supine to Sit;Sit to Supine     Supine to sit: Mod assist Sit to supine: Mod assist   General bed mobility comments: pt requires mod A to manage mvmt of LLE as well as to assist bringing trunk fwd and sliding hips to EOB. Pt unable to put full wt on left hip in sitting  Transfers Overall transfer level: Needs assistance Equipment used: Rolling walker (2 wheeled) Transfers: Sit to/from Stand Sit to Stand: Mod assist         General transfer comment: facilitation to right leg to increase quad activation and push through floor. Mod A at trunk for power up, vc's for hand placement.  Ambulation/Gait             General Gait  Details: NT as pt could not hop on RLE and could not take any wt on LLE today  Stairs            Wheelchair Mobility    Modified Rankin (Stroke Patients Only)       Balance Overall balance assessment: Needs assistance Sitting-balance support: Bilateral upper extremity supported;Feet supported Sitting balance-Leahy Scale: Poor Sitting balance - Comments: requires UE support due to pain Postural control: Right lateral lean Standing balance support: Bilateral upper extremity supported Standing balance-Leahy Scale: Poor Standing balance comment: can maintain standing with UE support and no LOB in static stance                             Pertinent Vitals/Pain VSS    Home Living Family/patient expects to be discharged to:: Private residence Living Arrangements: Spouse/significant other Available Help at Discharge: Family;Available PRN/intermittently Type of Home: House Home Access: Stairs to enter   Entrance Stairs-Number of Steps: 1 Home Layout: Laundry or work area in basement;Able to live on main level with bedroom/bathroom;Two level Home Equipment: Cane - single point;Wheelchair - manual Additional Comments: pt's wife owns her own business and will be at work in the day    Prior Function Level of Independence: Independent         Comments: pt uses SPC for most ambulation due to old ankle fxs and left femur fx     Hand Dominance  Extremity/Trunk Assessment   Upper Extremity Assessment: Overall WFL for tasks assessed           Lower Extremity Assessment: RLE deficits/detail;LLE deficits/detail RLE Deficits / Details: WFL LLE Deficits / Details: unable to lift LLE, not thoroughly tested due to pain left side, wears brace left knee for stability and pain control since femur fx in 1988  Cervical / Trunk Assessment: Normal  Communication   Communication: No difficulties  Cognition Arousal/Alertness: Awake/alert Behavior During  Therapy: Anxious;WFL for tasks assessed/performed Overall Cognitive Status: Within Functional Limits for tasks assessed       Memory: Decreased short-term memory              General Comments      Exercises        Assessment/Plan    PT Assessment Patient needs continued PT services  PT Diagnosis Difficulty walking;Abnormality of gait;Acute pain   PT Problem List Decreased strength;Decreased range of motion;Decreased activity tolerance;Decreased balance;Decreased mobility;Decreased knowledge of use of DME;Decreased knowledge of precautions;Pain  PT Treatment Interventions Gait training;DME instruction;Functional mobility training;Therapeutic activities;Therapeutic exercise;Balance training;Neuromuscular re-education;Patient/family education   PT Goals (Current goals can be found in the Care Plan section) Acute Rehab PT Goals Patient Stated Goal: return home and to driving his restored cars PT Goal Formulation: With patient Time For Goal Achievement: 04/02/14 Potential to Achieve Goals: Good    Frequency Min 3X/week   Barriers to discharge Decreased caregiver support wife works in day    Co-evaluation               End of Session Equipment Utilized During Treatment: Gait belt Activity Tolerance: Patient limited by pain Patient left: in bed;with call bell/phone within reach Nurse Communication: Mobility status    Functional Assessment Tool Used: clinical judgement Functional Limitation: Mobility: Walking and moving around Mobility: Walking and Moving Around Current Status [phone]([id number]): At least 40 percent but less than 60 percent impaired, limited or restricted Mobility: Walking and Moving Around Goal Status [phone](G8979): At least 1 percent but less than 20 percent impaired, limited or restricted    Time: 1401-1432 PT Time Calculation (min): 31 min   Charges:   PT Evaluation $Initial PT Evaluation Tier I: 1 Procedure PT Treatments $Therapeutic Activity: 23-37  mins   PT G Codes:   Functional Assessment Tool Used: clinical judgement Functional Limitation: Mobility: Walking and moving around   [place] LakeVictoria Ellah Otte, PT  Acute Rehab Services  [phone]  Elyse HsuVictoria L Catriona Dillenbeck 03/19/2014, 3:33 PM

## 2014-03-19 NOTE — Progress Notes (Signed)
Addendum  Patient's D5 infusion was discontinued earlier this afternoon with a CBG of 300. CBG this pm is 327. Have given order to start lantus at a much lower dose than his home dose, given he presented with severe hypoglycemia causing a syncopal event and requiring dextrose fluids. He is VERY upset that we are not starting his home regimen. Have explained to him to rationale behind "going slow" with his insulin. He was counseled that his CBGs would be monitored thruout the night and if at any time they remained elevated we could start a SSI. He is extremely anxious and upset about this issue, but I believe it is prudent to proceed with caution given his severe hypoglycemia and syncope.  Peggye PittEstela Hernandez, MD [facility] Pager: [phone]

## 2014-03-19 NOTE — Progress Notes (Signed)
Utilization Review Completed.[name] T Dowell5/20/2015  

## 2014-03-19 NOTE — Progress Notes (Signed)
Clinical Social Work Department BRIEF PSYCHOSOCIAL ASSESSMENT 03/19/2014  Patient:  [name], [name]     Account Number:  [id number]     Admit date:  03/18/2014  Clinical Social Worker:  Ulyess Blossom  Date/Time:  03/19/2014 10:06 PM  Referred by:  Physician  Date Referred:  03/19/2014 Referred for  SNF Placement   Other Referral:   Interview type:  Patient Other interview type:    PSYCHOSOCIAL DATA Living Status:  WIFE Admitted from facility:   Level of care:   Primary support name:  linda Augustus Primary support relationship to patient:  SPOUSE Degree of support available:   Pt reports good support.    CURRENT CONCERNS Current Concerns  Post-Acute Placement   Other Concerns:   Pt is concerned with his insulin dosing in the hospital and is refusing to eat because the MD will not give him his Lantus and his BS is over 300.    SOCIAL WORK ASSESSMENT / PLAN CSW met with pt and explained role of CSW/dcp.  Pt upset with hospital and MD re: diabetes mgmt. and does not believe that his MD cares about him or what he has to say. He is disappointed with [facility]. 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  CSW offered emotional support.  Pt is agreeable to NH bed search in [facility] and prefers [facility].  CSW to initiate bed search and facilitate NH tx as appropriate.   Assessment/plan status:  Psychosocial Support/Ongoing Assessment of Needs Other assessment/ plan:   Information/referral to community resources:    PATIENT'S/FAMILY'S RESPONSE TO PLAN OF CARE: Pt anxious re: his diabetes care.  Determined to reach out to his community PCP for help in communicating with his attending, or even the hospital's "higher ups" if necessary.  Pt very appreciative of CSW willing to listen and supportive counseling.

## 2014-03-20 LAB — BASIC METABOLIC PANEL
BUN: 33 mg/dL — AB (ref 6–23)
BUN: 56 mg/dL — ABNORMAL HIGH (ref 6–23)
BUN: 57 mg/dL — ABNORMAL HIGH (ref 6–23)
BUN: 59 mg/dL — AB (ref 6–23)
CALCIUM: 8.3 mg/dL — AB (ref 8.4–10.5)
CHLORIDE: 91 meq/L — AB (ref 96–112)
CHLORIDE: 97 meq/L (ref 96–112)
CO2: 12 mEq/L — ABNORMAL LOW (ref 19–32)
CO2: 11 mEq/L — ABNORMAL LOW (ref 19–32)
CO2: 20 mEq/L (ref 19–32)
CO2: 21 mEq/L (ref 19–32)
CREATININE: 1.25 mg/dL (ref 0.50–1.35)
Calcium: 8.6 mg/dL (ref 8.4–10.5)
Calcium: 8.3 mg/dL — ABNORMAL LOW (ref 8.4–10.5)
Calcium: 8.1 mg/dL — ABNORMAL LOW (ref 8.4–10.5)
Chloride: 96 mEq/L (ref 96–112)
Chloride: 93 mEq/L — ABNORMAL LOW (ref 96–112)
Creatinine, Ser: 2.44 mg/dL — ABNORMAL HIGH (ref 0.50–1.35)
Creatinine, Ser: 1.97 mg/dL — ABNORMAL HIGH (ref 0.50–1.35)
Creatinine, Ser: 1.84 mg/dL — ABNORMAL HIGH (ref 0.50–1.35)
GFR calc Af Amer: 28 mL/min — ABNORMAL LOW (ref >90)
GFR calc non Af Amer: 55 mL/min — ABNORMAL LOW (ref >90)
GFR calc non Af Amer: 25 mL/min — ABNORMAL LOW (ref >90)
GFR, EST AFRICAN AMERICAN: 64 mL/min — AB (ref >90)
GFR, EST AFRICAN AMERICAN: 37 mL/min — AB (ref >90)
GFR, EST AFRICAN AMERICAN: 40 mL/min — AB (ref >90)
GFR, EST NON AFRICAN AMERICAN: 32 mL/min — AB (ref >90)
GFR, EST NON AFRICAN AMERICAN: 34 mL/min — AB (ref >90)
GLUCOSE: 600 mg/dL — AB (ref 70–99)
Glucose, Bld: 461 mg/dL — ABNORMAL HIGH (ref 70–99)
Glucose, Bld: 598 mg/dL (ref 70–99)
Glucose, Bld: 493 mg/dL — ABNORMAL HIGH (ref 70–99)
POTASSIUM: 5.3 meq/L (ref 3.7–5.3)
POTASSIUM: 4.9 meq/L (ref 3.7–5.3)
POTASSIUM: 5.4 meq/L — AB (ref 3.7–5.3)
POTASSIUM: 4.5 meq/L (ref 3.7–5.3)
SODIUM: 133 meq/L — AB (ref 137–147)
SODIUM: 131 meq/L — AB (ref 137–147)
Sodium: 134 mEq/L — ABNORMAL LOW (ref 137–147)
Sodium: 132 mEq/L — ABNORMAL LOW (ref 137–147)

## 2014-03-20 LAB — HEMOGLOBIN A1C
HEMOGLOBIN A1C: 7.1 % — AB (ref <5.7)
MEAN PLASMA GLUCOSE: 157 mg/dL — AB (ref <117)

## 2014-03-20 LAB — GLUCOSE, CAPILLARY
GLUCOSE-CAPILLARY: 532 mg/dL — AB (ref 70–99)
Glucose-Capillary: 454 mg/dL — ABNORMAL HIGH (ref 70–99)
Glucose-Capillary: 519 mg/dL — ABNORMAL HIGH (ref 70–99)
Glucose-Capillary: 583 mg/dL (ref 70–99)
Glucose-Capillary: 521 mg/dL — ABNORMAL HIGH (ref 70–99)
Glucose-Capillary: 478 mg/dL — ABNORMAL HIGH (ref 70–99)
Glucose-Capillary: 449 mg/dL — ABNORMAL HIGH (ref 70–99)

## 2014-03-20 LAB — CBC
HEMATOCRIT: 35.7 % — AB (ref 39.0–52.0)
HEMOGLOBIN: 11.7 g/dL — AB (ref 13.0–17.0)
MCH: 30.7 pg (ref 26.0–34.0)
MCHC: 32.8 g/dL (ref 30.0–36.0)
MCV: 93.7 fL (ref 78.0–100.0)
Platelets: 159 10*3/uL (ref 150–400)
RBC: 3.81 MIL/uL — ABNORMAL LOW (ref 4.22–5.81)
RDW: 15.1 % (ref 11.5–15.5)
WBC: 7.3 10*3/uL (ref 4.0–10.5)

## 2014-03-20 MED ORDER — DEXTROSE 50 % IV SOLN: 25.0000 mL | INTRAVENOUS | Status: DC | PRN

## 2014-03-20 MED ORDER — INSULIN GLARGINE 100 UNIT/ML ~~LOC~~ SOLN
45.0000 [IU] | Freq: Every day | SUBCUTANEOUS | Status: DC
  Administered 2014-03-20: 45 [IU] via SUBCUTANEOUS
  Filled 2014-03-20: qty 0.45

## 2014-03-20 MED ORDER — DEXTROSE-NACL 5-0.45 % IV SOLN
INTRAVENOUS | Status: DC
  Administered 2014-03-20 – 2014-03-21 (×2): via INTRAVENOUS

## 2014-03-20 MED ORDER — POTASSIUM CHLORIDE 10 MEQ/100ML IV SOLN
10.0000 meq | INTRAVENOUS | Status: AC
  Administered 2014-03-20 (×2): 10 meq via INTRAVENOUS
  Filled 2014-03-20 (×2): qty 100

## 2014-03-20 MED ORDER — SODIUM CHLORIDE 0.9 % IV SOLN
INTRAVENOUS | Status: DC
  Administered 2014-03-20 – 2014-03-21 (×2): via INTRAVENOUS

## 2014-03-20 MED ORDER — INSULIN ASPART 100 UNIT/ML ~~LOC~~ SOLN
0.0000 [IU] | Freq: Three times a day (TID) | SUBCUTANEOUS | Status: DC
Start: 2014-03-20 — End: 2014-03-20
  Administered 2014-03-20: 15 [IU] via SUBCUTANEOUS

## 2014-03-20 MED ORDER — INSULIN ASPART 100 UNIT/ML ~~LOC~~ SOLN: 0.0000 [IU] | Freq: Three times a day (TID) | SUBCUTANEOUS | Status: DC

## 2014-03-20 MED ORDER — NON FORMULARY: 14.0000 [IU] | Freq: Every day | Status: DC

## 2014-03-20 MED ORDER — INSULIN REGULAR HUMAN 100 UNIT/ML IJ SOLN
14.0000 [IU] | Freq: Two times a day (BID) | INTRAMUSCULAR | Status: DC
  Administered 2014-03-20: 14 [IU] via SUBCUTANEOUS

## 2014-03-20 MED ORDER — SODIUM CHLORIDE 0.9 % IV SOLN
INTRAVENOUS | Status: DC
  Administered 2014-03-20: 19:00:00 via INTRAVENOUS
  Filled 2014-03-20: qty 1

## 2014-03-20 MED ORDER — SODIUM CHLORIDE 0.9 % IV SOLN
Freq: Once | INTRAVENOUS | Status: DC
Start: 2014-03-20 — End: 2014-03-21

## 2014-03-20 NOTE — Progress Notes (Signed)
[facility] PROGRESS NOTE  [name] ZOX:[id number] DOB: [birth year] DOA: 03/18/2014 PCP: Lyndon Code, MD  Assessment/Plan: Pelvic Fractures -Following fall. -No surgical intervention required. -Minimal pain med requirements. -PT/OT evals rec SNF: SW alerted.  Syncope -Related to hypoglycemia. -No further workup. -Will DC telemetry today.  Hypoglycemia/DM II -Is now hyperglycemic. -Refused multiple attempts to administer insulin overnight. -Long discussion today with patient in presence of RN; he is agreeable to lantus and we will use his home supply of humulin R for meal coverage in addition to a sliding scale. -Will attempt this for 24 hours. -May need insulin drip as he is already dropping into DKA with a GAP of 26. -Recheck BMET this afternoon.  Mild Hyperkalemia -2/2 metabolic acidosis. -Should improve with insulin administration.  Hypothyroidism -TSH WNL. -Continue armour thyroid.  Hyperlipidemia -Continue statin.  HTN -Well controlled. -Continue home medications.  Code Status: Full Code Family Communication: Patient only. Disposition Plan: SNF.   Consultants:  None   Antibiotics:  None   Subjective: No issues. Was " upset at [place]" but now feels better after discussion and understands our rationale.  Objective: Filed Vitals:   03/19/14 0530 03/19/14 1441 03/19/14 2036 03/20/14 0420  BP: 126/76 129/63 124/66 124/57  Pulse: 84 96 111 107  Temp: 98.1 F (36.7 C) 97.9 F (36.6 C) 99.1 F (37.3 C) 97.8 F (36.6 C)  TempSrc: Oral Oral Oral Oral  Resp: 18 17 18 18   Height:      Weight: 100.472 kg (221 lb 8 oz)     SpO2: 93% 92% 93% 94%    Intake/Output Summary (Last 24 hours) at 03/20/14 1106 Last data filed at 03/20/14 1022  Gross per 24 hour  Intake    480 ml  Output   1950 ml  Net  -1470 ml   Filed Weights   03/19/14 0006 03/19/14 0530  Weight: 100.245 kg (221 lb) 100.472 kg (221 lb 8 oz)    Exam:   General:   AA Ox3  Cardiovascular: RRR  Respiratory: CTA B  Abdomen: S/NT/ND/+BS  Extremities: 1+edema bilaterally   Neurologic:  Non-focal  Data Reviewed: Basic Metabolic Panel:  Recent Labs Lab 03/18/14 1605 03/19/14 0411 03/20/14 0325  NA 144 140 134*  K 4.2 4.2 5.3  CL 106 107 96  CO2 24 21 12*  GLUCOSE 204* 107* 461*  BUN 16 11 33*  CREATININE 0.90 0.84 1.25  CALCIUM 8.8 8.4 8.6   Liver Function Tests:  Recent Labs Lab 03/19/14 0411  AST 26  ALT 24  ALKPHOS 74  BILITOT 0.8  PROT 5.6*  ALBUMIN 3.0*   No results found for this basename: LIPASE, AMYLASE,  in the last 168 hours No results found for this basename: AMMONIA,  in the last 168 hours CBC:  Recent Labs Lab 03/18/14 1605 03/19/14 0411 03/20/14 0325  WBC 10.5 6.7 7.3  NEUTROABS 7.8* 3.6  --   HGB 12.8* 11.8* 11.7*  HCT 38.8* 36.1* 35.7*  MCV 91.1 92.3 93.7  PLT 176 146* 159   Cardiac Enzymes: No results found for this basename: CKTOTAL, CKMB, CKMBINDEX, TROPONINI,  in the last 168 hours BNP (last 3 results) No results found for this basename: PROBNP,  in the last 8760 hours CBG:  Recent Labs Lab 03/19/14 2024 03/19/14 2237 03/20/14 0418 03/20/14 0812 03/20/14 1103  GLUCAP 343* 359* 454* 519* >600*    Recent Results (from the past 240 hour(s))  MRSA PCR SCREENING  Status: None   Collection Time    03/18/14 11:56 PM      Result Value Ref Range Status   MRSA by PCR NEGATIVE  NEGATIVE Final   Comment:            The GeneXpert MRSA Assay (FDA     approved for NASAL specimens     only), is one component of a     comprehensive MRSA colonization     surveillance program. It is not     intended to diagnose MRSA     infection nor to guide or     monitor treatment for     MRSA infections.     Studies: Dg Chest 2 View  03/18/2014   CLINICAL DATA:  Hypoglycemia, syncope, diabetes, hypertension  EXAM: CHEST  2 VIEW  COMPARISON:  08/15/2013  FINDINGS: Minimal enlargement of cardiac  silhouette.  Mediastinal contours and pulmonary vascularity normal.  Lungs clear.  No pleural effusion or pneumothorax.  Bones demineralized.  IMPRESSION: Minimal enlargement of cardiac silhouette.  No acute abnormalities.   Electronically Signed   By: Ulyses SouthwardMark  Boles M.D.   On: 03/18/2014 17:51   Dg Hip Complete Left  03/18/2014   CLINICAL DATA:  LEFT hip pain, syncope, pain down LEFT femur, hypoglycemia  EXAM: LEFT HIP - COMPLETE 2+ VIEW  COMPARISON:  None  FINDINGS: Bones appear demineralized.  Hip and SI joint spaces preserved.  No definite acute fracture, dislocation, or bone destruction.  IMPRESSION: No acute osseous abnormalities.   Electronically Signed   By: Ulyses SouthwardMark  Boles M.D.   On: 03/18/2014 17:50   Ct Pelvis Wo Contrast  03/18/2014   CLINICAL DATA:  Left hip pain since a fall today.  EXAM: CT PELVIS WITHOUT CONTRAST  TECHNIQUE: Multidetector CT imaging of the pelvis was performed following the standard protocol without intravenous contrast.  COMPARISON:  Radiographs dated 03/18/2014  FINDINGS: There are nondisplaced fractures of the inferior and superior pubic rami. There is a subtle fracture of the superior cortex of the superior pubic ramus adjacent to the left pubic body as well as a nondisplaced hairline fracture of superior pubic ramus at the junction with the acetabulum.  There is a very subtle impaction fracture of the left side of the sacrum.  The proximal left femur is intact.  No other acute osseous abnormality. Joint space narrowing of both hips. Incidental note is made of multiple diverticula in the distal colon.  IMPRESSION: 1. Fractures of the left inferior and superior pubic rami and left sacral ala. 2. Intact  proximal left femur.   Electronically Signed   By: Geanie CooleyJim  Maxwell M.D.   On: 03/18/2014 20:53    Scheduled Meds: . aspirin EC  81 mg Oral Daily  . atorvastatin  40 mg Oral Daily  . enoxaparin (LOVENOX) injection  40 mg Subcutaneous Q24H  . hydrochlorothiazide  25 mg Oral Daily    . insulin aspart  0-9 Units Subcutaneous TID WC  . insulin aspart  5 Units Subcutaneous Once  . insulin glargine  15 Units Subcutaneous QHS  . insulin glargine  45 Units Subcutaneous Daily  . [START ON 03/21/2014] NON FORMULARY 14 Units  14 Units Subcutaneous QAC breakfast  . omega-3 acid ethyl esters  1 g Oral Daily  . OxyCODONE  10 mg Oral Q12H  . pantoprazole  40 mg Oral Daily  . pyridOXINE  100 mg Oral Daily  . thyroid  120 mg Oral QAC breakfast   Continuous Infusions:  Principal Problem:   Pelvis fracture Active Problems:   Hypoglycemia   DIABETES MELLITUS, TYPE II   HYPERLIPIDEMIA   HYPERTENSION   Multiple closed fractures of pelvis   Syncope    Time spent: 35 minutes. Greater than 50% of this time was spent in direct contact with the patient coordinating care.    [name]  [facility] Pager [phone]  If 7PM-7AM, please contact night-coverage at [email or web address], password [email or web address] 03/20/2014, 11:06 AM  LOS: 2 days

## 2014-03-20 NOTE — Progress Notes (Signed)
Pt Note: pt was offered insulin threw the night, but refused. After receiving breakfast this morning pt became verbally abusive to all staff about not getting his insulin and refused to eat.  I notified Dr [name]. We both went in and spoke to pt about his status. He finally agree's to try Dr. Hardie Joseph's orders.  After giving all the insulin CBG continues to be high so pt was ordered to be transferred to SDU. I went in to speak with him and wife. Wife proceeds to tell me why [name] does not want his insulins changed. According to wife this same thing happen during his last visit at [facility]. Wife does mention being frustrated with the system for not listening to her husband last night. Apparently pts PCP has tried pt on all other insulins but pt was unable to take.

## 2014-03-20 NOTE — Progress Notes (Signed)
Patient has slipped into DKA, probably due to time frame when he refused insulin. Will need to go on the DKA protocol. Start IV insulin and frequent BMET checks. IVF. Will continue to follow closely thruout the night.  Peggye PittEstela Hernandez, MD [facility] Pager: [phone]

## 2014-03-20 NOTE — Progress Notes (Signed)
Follow-up:  Notified at approx 2300 on 03/19/14 that pt's HS CBG was 359. He refused his HS Lantus because he stated he takes his insulin in the morning. Apparently pt rude and indicating we were not taking care of him properly, despite RN's attempts to educate importance of compliance w/ current plan. RN was ask to document pt's refusal and to cover HS CBG w/ Novolog 5 units SQ. RN repaged to report pt is insisting he get 29 units of Novolin R and that he will not take Novolog. Pharmacy consulted regarding weather or not Novolin R insulin was even available which pharmacist confirmed it is not in our [facility]. Informed RN of this and requested she encourage pt to allow coverage w/ Novolog tonight. I was notified at approx 0430 that pt's 0400 CBG was 454 and that pt had indeed refused the HS Novolog (5 u) coverage. Pt reporting to RN he will have "his" insulin brought in by his wife today. Will communicate this to am rounding MD.  Leanne ChangKatherine P. Anasofia Micallef, Np-C [facility] Pager [phone]

## 2014-03-20 NOTE — Progress Notes (Addendum)
Clinical Social Work Department CLINICAL SOCIAL WORK PLACEMENT NOTE 03/20/2014  Patient:  [name],Tony R  Account Number:  [id number] Admit date:  03/18/2014  Clinical Social Worker:  Carren RangLINDSAY PURITZ, LCSWA  Date/time:  03/20/2014 01:49 PM  Clinical Social Work is seeking post-discharge placement for this patient at the following level of care:   SKILLED NURSING   (*CSW will update this form in Epic as items are completed)   03/20/2014  Patient/family provided with [facility] Department of Clinical Social Work's list of facilities offering this level of care within the geographic area requested by the patient (or if unable, by the patient's family).  03/20/2014  Patient/family informed of their freedom to choose among providers that offer the needed level of care, that participate in Medicare, Medicaid or managed care program needed by the patient, have an available bed and are willing to accept the patient.  03/20/2014  Patient/family informed of [facility]' ownership interest in [facility], as well as of the fact that they are under no obligation to receive care at this facility.  PASARR submitted to EDS on 03/20/2014 PASARR number received from EDS on 03/20/2014  FL2 transmitted to all facilities in geographic area requested by pt/family on  03/20/2014 FL2 transmitted to all facilities within larger geographic area on   Patient informed that his/her managed care company has contracts with or will negotiate with  certain facilities, including the following:     Patient/family informed of bed offers received:  03/20/14 [facility] Physician recommends and patient chooses bed at    [facility] 03/[facility]/14 [facility]  The following physician request were entered in Epic:   Additional Comments: [facility], but wife asked CSW to look at  [facility]. They can take pt today. Facilities coordinating with insurance company to get the plan changed so [name] can be reimbursed for SNF stay. Family updated. Waiting for [facility] to get insurance arranged. Covering CSW aware and will facilitate discharge when pt is deemed ready from facility standpoint.   Maree KrabbeLindsay Puritz, MSW, LCSWA [phone]   Maryclare LabradorJulie Eura Mccauslin, MSW, [facility] Clinical Social Worker [phone]

## 2014-03-20 NOTE — Care Management Note (Unsigned)
    Page 1 of 1   03/20/2014     4:37:19 PM CARE MANAGEMENT NOTE 03/20/2014  Patient:  Evelina BucyDODSON,Avantae R   Account Number:  [id number]  Date Initiated:  03/20/2014  Documentation initiated by:  Channin Agustin  Subjective/Objective Assessment:   Pt adm on 5/19 with hypoglycemia, fall and pelvic fractures.  PTA, pt resides at home with spouse, who owns her own business.     Action/Plan:   PT recommending SNF at dc; CSW consulted to facilitate dc to SNF when medically stable for dc.   Anticipated DC Date:  03/22/2014   Anticipated DC Plan:  SKILLED NURSING FACILITY  In-house referral  Clinical Social Worker      DC Planning Services  CM consult      Choice offered to / List presented to:             Status of service:  In process, will continue to follow Medicare Important Message given?   (If response is "NO", the following Medicare IM given date fields will be blank) Date Medicare IM given:   Date Additional Medicare IM given:    Discharge Disposition:    Per UR Regulation:  Reviewed for med. necessity/level of care/duration of stay  If discussed at Long Length of Stay Meetings, dates discussed:    Comments:

## 2014-03-20 NOTE — Progress Notes (Signed)
CSW provided bed offers to patient. Patient states he is not sure which one he would like but possibly prefers the one near the hospital. CSW explained that if patient is not happy at the facility, there are social workers there who can help him transition into a different one. Patient pleased with this information and provided patient's wife's phone number [phone]). [facility] and they stated they will start auth.  Maree KrabbeLindsay Graysyn Bache, MSW, Theresia MajorsLCSWA [phone]

## 2014-03-20 NOTE — Progress Notes (Signed)
OT Cancellation Note  Patient Details Name: Tony BucyDonald R Joseph MRN: [id number] DOB: 27-Jun-1941   Cancelled Treatment:    Reason Eval/Treat Not Completed: Other (comment) Pt's D/C plan is SNF. No apparent immediate acute care OT needs, therefore will defer OT to SNF. If OT eval is needed please call Acute Rehab Dept. at [phone] or text page OT at [phone].    Evette GeorgesCatherine Eva Kashlynn Kundert [phone] 03/20/2014, 7:40 AM

## 2014-03-20 NOTE — Progress Notes (Signed)
Physical Therapy Treatment Patient Details Name: Tony BucyDonald R Joseph MRN: [id number] DOB: 1941-08-11 Today's Date: 03/20/2014    History of Present Illness [name] is a 73 y.o. male with history of diabetes mellitus, hypertension, hyperlipidemia, osteoarthritis, hypothyroidism had a syncopal episode today and fell at his house. Glucose 29 when wife checked. X-ray showed fx of sacrum and pelvis.    PT Comments    Pt improving mobility. Session shortened due to pt picking up cell phone and returning a call prior to our session being over despite our request that he wait just a few minutes to make the call. Pt on phone for >5 minutes. Explained we were leaving at that time.   Follow Up Recommendations  SNF     Equipment Recommendations  Rolling walker with 5" wheels    Recommendations for Other Services       Precautions / Restrictions Precautions Precautions: Fall    Mobility  Bed Mobility Overal bed mobility: Needs Assistance Bed Mobility: Supine to Sit     Supine to sit: Mod assist;+2 for physical assistance     General bed mobility comments: Assist to bring legs over and trunk up.  Transfers Overall transfer level: Needs assistance Equipment used: Rolling walker (2 wheeled) Transfers: Sit to/from Stand Sit to Stand: Mod assist;+2 physical assistance         General transfer comment: Assist to bring hips up. Verbal cues for hand placement.  Ambulation/Gait Ambulation/Gait assistance: Min assist;+2 physical assistance Ambulation Distance (Feet): 5 Feet Assistive device: Rolling walker (2 wheeled) Gait Pattern/deviations: Step-to pattern;Decreased step length - left;Decreased step length - right;Decreased stance time - left Gait velocity: decr Gait velocity interpretation: Below normal speed for age/gender General Gait Details: Pt sliding rt foot forward on floor. Heavy reliance on arms to decr weight on LLE.   Stairs            Wheelchair  Mobility    Modified Rankin (Stroke Patients Only)       Balance           Standing balance support: Bilateral upper extremity supported Standing balance-Leahy Scale: Poor Standing balance comment: Stands with walker and min A                    Cognition Arousal/Alertness: Awake/alert Behavior During Therapy: WFL for tasks assessed/performed Overall Cognitive Status: Within Functional Limits for tasks assessed       Memory: Decreased short-term memory              Exercises      General Comments        Pertinent Vitals/Pain Pain in lt pelvis. Repositioned in chair.    Home Living                      Prior Function            PT Goals (current goals can now be found in the care plan section) Progress towards PT goals: Progressing toward goals    Frequency  Min 3X/week    PT Plan Current plan remains appropriate    Co-evaluation             End of Session Equipment Utilized During Treatment: Gait belt Activity Tolerance: Patient limited by fatigue;Patient limited by pain Patient left: in chair;with call bell/phone within reach     Time: 1500-1522 PT Time Calculation (min): 22 min  Charges:  $Gait Training: 8-22 mins  G CodesAngelina Ok:      Alford Gamero W Ngoc Daughtridge 03/20/2014, 3:36 PM  [employer] PT [phone]

## 2014-03-21 ENCOUNTER — Encounter: Payer: Self-pay | Admitting: Internal Medicine

## 2014-03-21 DIAGNOSIS — N183 Chronic kidney disease, stage 3 unspecified: Secondary | ICD-10-CM

## 2014-03-21 DIAGNOSIS — N179 Acute kidney failure, unspecified: Secondary | ICD-10-CM

## 2014-03-21 LAB — BASIC METABOLIC PANEL
BUN: 60 mg/dL — ABNORMAL HIGH (ref 6–23)
BUN: 61 mg/dL — AB (ref 6–23)
CO2: 22 mEq/L (ref 19–32)
CO2: 21 meq/L (ref 19–32)
CREATININE: 1.81 mg/dL — AB (ref 0.50–1.35)
CREATININE: 1.85 mg/dL — AB (ref 0.50–1.35)
Calcium: 8.2 mg/dL — ABNORMAL LOW (ref 8.4–10.5)
Calcium: 8.1 mg/dL — ABNORMAL LOW (ref 8.4–10.5)
Chloride: 101 mEq/L (ref 96–112)
Chloride: 104 mEq/L (ref 96–112)
GFR calc non Af Amer: 34 mL/min — ABNORMAL LOW (ref >90)
GFR, EST AFRICAN AMERICAN: 41 mL/min — AB (ref >90)
GFR, EST AFRICAN AMERICAN: 40 mL/min — AB (ref >90)
GFR, EST NON AFRICAN AMERICAN: 35 mL/min — AB (ref >90)
Glucose, Bld: 288 mg/dL — ABNORMAL HIGH (ref 70–99)
Glucose, Bld: 101 mg/dL — ABNORMAL HIGH (ref 70–99)
Potassium: 3.9 mEq/L (ref 3.7–5.3)
Potassium: 3.7 mEq/L (ref 3.7–5.3)
SODIUM: 134 meq/L — AB (ref 137–147)
Sodium: 139 mEq/L (ref 137–147)

## 2014-03-21 LAB — CBC
HCT: 29.3 % — ABNORMAL LOW (ref 39.0–52.0)
Hemoglobin: 10.2 g/dL — ABNORMAL LOW (ref 13.0–17.0)
MCH: 30.7 pg (ref 26.0–34.0)
MCHC: 34.8 g/dL (ref 30.0–36.0)
MCV: 88.3 fL (ref 78.0–100.0)
PLATELETS: 162 10*3/uL (ref 150–400)
RBC: 3.32 MIL/uL — AB (ref 4.22–5.81)
RDW: 14.9 % (ref 11.5–15.5)
WBC: 10.3 10*3/uL (ref 4.0–10.5)

## 2014-03-21 LAB — GLUCOSE, CAPILLARY
GLUCOSE-CAPILLARY: 103 mg/dL — AB (ref 70–99)
GLUCOSE-CAPILLARY: 161 mg/dL — AB (ref 70–99)
GLUCOSE-CAPILLARY: 218 mg/dL — AB (ref 70–99)
GLUCOSE-CAPILLARY: 352 mg/dL — AB (ref 70–99)
GLUCOSE-CAPILLARY: 79 mg/dL (ref 70–99)
GLUCOSE-CAPILLARY: 84 mg/dL (ref 70–99)
Glucose-Capillary: 142 mg/dL — ABNORMAL HIGH (ref 70–99)
Glucose-Capillary: 271 mg/dL — ABNORMAL HIGH (ref 70–99)
Glucose-Capillary: 110 mg/dL — ABNORMAL HIGH (ref 70–99)

## 2014-03-21 MED ORDER — NON FORMULARY: 14.0000 [IU] | Freq: Two times a day (BID) | Status: DC

## 2014-03-21 MED ORDER — INSULIN GLARGINE 100 UNIT/ML ~~LOC~~ SOLN
20.0000 [IU] | Freq: Every day | SUBCUTANEOUS | Status: DC
  Administered 2014-03-21: 20 [IU] via SUBCUTANEOUS
  Filled 2014-03-21: qty 0.2

## 2014-03-21 MED ORDER — INSULIN REGULAR HUMAN 100 UNIT/ML IJ SOLN
14.0000 [IU] | Freq: Two times a day (BID) | INTRAMUSCULAR | Status: DC
  Administered 2014-03-21: 14 [IU] via SUBCUTANEOUS

## 2014-03-21 MED ORDER — INSULIN ASPART 100 UNIT/ML ~~LOC~~ SOLN
0.0000 [IU] | Freq: Every day | SUBCUTANEOUS | Status: DC
Start: 2014-03-21 — End: 2014-03-21

## 2014-03-21 MED ORDER — GLUCOSE 40 % PO GEL: 1.0000 | ORAL | Status: DC | PRN

## 2014-03-21 MED ORDER — OXYCODONE HCL ER 10 MG PO T12A: 10.0000 mg | EXTENDED_RELEASE_TABLET | Freq: Two times a day (BID) | ORAL | Status: DC

## 2014-03-21 MED ORDER — INSULIN GLARGINE 100 UNIT/ML ~~LOC~~ SOLN: 20.0000 [IU] | Freq: Every morning | SUBCUTANEOUS | Status: DC

## 2014-03-21 MED ORDER — HYDROCODONE-ACETAMINOPHEN 7.5-325 MG PO TABS: 1.0000 | ORAL_TABLET | Freq: Four times a day (QID) | ORAL | Status: DC | PRN

## 2014-03-21 MED ORDER — DEXTROSE 50 % IV SOLN: 50.0000 mL | Freq: Once | INTRAVENOUS | Status: DC | PRN

## 2014-03-21 MED ORDER — INSULIN ASPART 100 UNIT/ML ~~LOC~~ SOLN: 0.0000 [IU] | Freq: Three times a day (TID) | SUBCUTANEOUS | Status: DC

## 2014-03-21 MED ORDER — INSULIN REGULAR HUMAN 100 UNIT/ML IJ SOLN: 14.0000 [IU] | Freq: Two times a day (BID) | INTRAMUSCULAR | Status: DC

## 2014-03-21 MED ORDER — DEXTROSE 50 % IV SOLN: 25.0000 mL | Freq: Once | INTRAVENOUS | Status: DC | PRN

## 2014-03-21 MED ORDER — INSULIN GLARGINE 100 UNIT/ML ~~LOC~~ SOLN
20.0000 [IU] | Freq: Every day | SUBCUTANEOUS | Status: DC
  Filled 2014-03-21: qty 0.2

## 2014-03-21 NOTE — Discharge Summary (Addendum)
Physician Discharge Summary  [name] ZOX:[id number] DOB: May 20, 1941 DOA: 03/18/2014  PCP: Lyndon Code, MD  Admit date: 03/18/2014 Discharge date: 03/21/2014  Time spent: 45 minutes  Recommendations for Outpatient Follow-up:  -Will be discharged to SNF today for rehab s/p pelvic fractures. -Check BMET in 3 days to make sure renal function is returning to baseline.   Discharge Diagnoses:  Principal Problem:   Pelvis fracture Active Problems:   Hypoglycemia   DIABETES MELLITUS, TYPE II   HYPERLIPIDEMIA   HYPERTENSION   Multiple closed fractures of pelvis   Syncope   ARF (acute renal failure)   CKD (chronic kidney disease) stage 3, GFR 30-59 ml/min   Discharge Condition: Stable and improved  Filed Weights   03/19/14 0006 03/19/14 0530 03/20/14 1844  Weight: 100.245 kg (221 lb) 100.472 kg (221 lb 8 oz) 97.8 kg (215 lb 9.8 oz)    History of present illness:  [name] is a 73 y.o. male with history of diabetes mellitus, hypertension, hyperlipidemia, osteoarthritis, hypothyroidism had a syncopal episode today and fell at his house. Patient usually wakes up at 3:30 AM and takes his Lantus insulin. At around 1:30 PM usually patient has is upper and was walking towards his first-floor from basement when suddenly he lost consciousness. His wife who found him later on the floor found the patient to be lethargic. Checked patient's blood sugar and was found to be 29. Was given some juice after which patient which improved. Patient found it painful to move his lower extremity and was brought to the ER. Patient had initial x-rays which did not show any acute but chest pain was persistent CT pelvis was done which showed fracture of the pelvic bone and sacrum. Patient still mildly hypoglycemic for which D5 normal saline has been started. Patient has been admitted for further workup. Denies any chest pain shortness of breath palpitations nausea vomiting abdominal pain. Patient's  Lantus dose was recently increased by patient's primary care physician 2 weeks ago from 52 units to 58 units. Hospitalist admission was requested.     Hospital Course:   Pelvic Fractures  -Following fall.  -No surgical intervention required.  -Minimal pain med requirements.  -For SNF today for rehab per PT/OT recommendations.   Syncope  -Related to hypoglycemia.  -No further workup.   Hypoglycemia/DKA/DM II  -Very particular about how he takes his insulin to the point that he went into DKA because the hospital did not carry the EXACT type of insulin he wanted and we were forced to wait until family members could bring in is insulin from home. -For now, continue lantus 20 units in the morning, Humulin R (patient's own supply) 14 units before breakfast and before dinner. -May need to uptitrate lantus as he was on 58 units at home. But for now CBGs in the 80s-90s on above regimen.  Mild Hyperkalemia  -2/2 metabolic acidosis from DKA. -Resolved.  Hypothyroidism  -TSH WNL.  -Continue armour thyroid.   Hyperlipidemia  -Continue statin.   HTN  -Well controlled.  -Continue home medications.  Acute on CKD Stage III -Baseline Cr around 1.3-1.4. -Cr is 1.7 on DC, trending down from a high of 2.22. -Chronic component likely 2/2 long-standing DM. -ARB/HCTZ on hold until renal function returns to baseline.   Procedures:  None   Consultations:  None  Discharge Instructions      Discharge Instructions   Diet Carb Modified    Complete by:  As directed  Discontinue IV    Complete by:  As directed      Increase activity slowly    Complete by:  As directed             Medication List    STOP taking these medications       benazepril 40 MG tablet  Commonly known as:  LOTENSIN     hydrochlorothiazide 25 MG tablet  Commonly known as:  HYDRODIURIL     ibuprofen 400 MG tablet  Commonly known as:  ADVIL,MOTRIN      TAKE these medications       aspirin EC  81 MG tablet  Take 81 mg by mouth daily.     atorvastatin 40 MG tablet  Commonly known as:  LIPITOR  Take 40 mg by mouth daily.     diclofenac 50 MG EC tablet  Commonly known as:  VOLTAREN  Take 100 mg by mouth daily.     Fish Oil 1200 MG Caps  Take 1 capsule by mouth daily.     HYDROcodone-acetaminophen 7.5-325 MG per tablet  Commonly known as:  NORCO  Take 1 tablet by mouth every 6 (six) hours as needed.     insulin glargine 100 UNIT/ML injection  Commonly known as:  LANTUS  Inject 0.2 mLs (20 Units total) into the skin every morning.     insulin regular 100 units/mL injection  Commonly known as:  NOVOLIN R,HUMULIN R  Inject 0.14 mLs (14 Units total) into the skin 2 (two) times daily at 8 am and 10 pm.     OxyCODONE 10 mg T12a 12 hr tablet  Commonly known as:  OXYCONTIN  Take 1 tablet (10 mg total) by mouth every 12 (twelve) hours.     pantoprazole 40 MG tablet  Commonly known as:  PROTONIX  Take 40 mg by mouth daily.     pyridOXINE 100 MG tablet  Commonly known as:  VITAMIN B-6  Take 100 mg by mouth daily.     thyroid 120 MG tablet  Commonly known as:  ARMOUR  Take 120 mg by mouth daily before breakfast.       Allergies  Allergen Reactions  . Insulins     Pt unable to take other insulins besides Humulin R  . Novolog [Insulin Aspart]     Pt unable to take all other insulins besides Humulin R, "doesn't work for patient"  . Penicillins Rash      The results of significant diagnostics from this hospitalization (including imaging, microbiology, ancillary and laboratory) are listed below for reference.    Significant Diagnostic Studies: Dg Chest 2 View  03/18/2014   CLINICAL DATA:  Hypoglycemia, syncope, diabetes, hypertension  EXAM: CHEST  2 VIEW  COMPARISON:  08/15/2013  FINDINGS: Minimal enlargement of cardiac silhouette.  Mediastinal contours and pulmonary vascularity normal.  Lungs clear.  No pleural effusion or pneumothorax.  Bones demineralized.   IMPRESSION: Minimal enlargement of cardiac silhouette.  No acute abnormalities.   Electronically Signed   By: Ulyses Southward M.D.   On: 03/18/2014 17:51   Dg Hip Complete Left  03/18/2014   CLINICAL DATA:  LEFT hip pain, syncope, pain down LEFT femur, hypoglycemia  EXAM: LEFT HIP - COMPLETE 2+ VIEW  COMPARISON:  None  FINDINGS: Bones appear demineralized.  Hip and SI joint spaces preserved.  No definite acute fracture, dislocation, or bone destruction.  IMPRESSION: No acute osseous abnormalities.   Electronically Signed   By: Angelyn Punt.D.  On: 03/18/2014 17:50   Ct Pelvis Wo Contrast  03/18/2014   CLINICAL DATA:  Left hip pain since a fall today.  EXAM: CT PELVIS WITHOUT CONTRAST  TECHNIQUE: Multidetector CT imaging of the pelvis was performed following the standard protocol without intravenous contrast.  COMPARISON:  Radiographs dated 03/18/2014  FINDINGS: There are nondisplaced fractures of the inferior and superior pubic rami. There is a subtle fracture of the superior cortex of the superior pubic ramus adjacent to the left pubic body as well as a nondisplaced hairline fracture of superior pubic ramus at the junction with the acetabulum.  There is a very subtle impaction fracture of the left side of the sacrum.  The proximal left femur is intact.  No other acute osseous abnormality. Joint space narrowing of both hips. Incidental note is made of multiple diverticula in the distal colon.  IMPRESSION: 1. Fractures of the left inferior and superior pubic rami and left sacral ala. 2. Intact  proximal left femur.   Electronically Signed   By: Geanie CooleyJim  Maxwell M.D.   On: 03/18/2014 20:53    Microbiology: Recent Results (from the past 240 hour(s))  MRSA PCR SCREENING     Status: None   Collection Time    03/18/14 11:56 PM      Result Value Ref Range Status   MRSA by PCR NEGATIVE  NEGATIVE Final   Comment:            The GeneXpert MRSA Assay (FDA     approved for NASAL specimens     only), is one  component of a     comprehensive MRSA colonization     surveillance program. It is not     intended to diagnose MRSA     infection nor to guide or     monitor treatment for     MRSA infections.     Labs: Basic Metabolic Panel:  Recent Labs Lab 03/20/14 1400 03/20/14 1955 03/20/14 2208 03/21/14 0040 03/21/14 0436  NA 133* 131* 132* 134* 139  K 4.9 5.4* 4.5 3.9 3.7  CL 91* 93* 97 101 104  CO2 11* 20 21 21 22   GLUCOSE 600* 598* 493* 288* 101*  BUN 56* 57* 59* 60* 61*  CREATININE 2.44* 1.97* 1.84* 1.85* 1.81*  CALCIUM 8.3* 8.3* 8.1* 8.2* 8.1*   Liver Function Tests:  Recent Labs Lab 03/19/14 0411  AST 26  ALT 24  ALKPHOS 74  BILITOT 0.8  PROT 5.6*  ALBUMIN 3.0*   No results found for this basename: LIPASE, AMYLASE,  in the last 168 hours No results found for this basename: AMMONIA,  in the last 168 hours CBC:  Recent Labs Lab 03/18/14 1605 03/19/14 0411 03/20/14 0325 03/21/14 0040  WBC 10.5 6.7 7.3 10.3  NEUTROABS 7.8* 3.6  --   --   HGB 12.8* 11.8* 11.7* 10.2*  HCT 38.8* 36.1* 35.7* 29.3*  MCV 91.1 92.3 93.7 88.3  PLT 176 146* 159 162   Cardiac Enzymes: No results found for this basename: CKTOTAL, CKMB, CKMBINDEX, TROPONINI,  in the last 168 hours BNP: BNP (last 3 results) No results found for this basename: PROBNP,  in the last 8760 hours CBG:  Recent Labs Lab 03/21/14 0246 03/21/14 0351 03/21/14 0510 03/21/14 0615 03/21/14 0757  GLUCAP 142* 110* 103* 79 84       Signed:  Henderson CloudEstela Y Hernandez Acosta  [facility] Pager: [phone] 03/21/2014, 9:26 AM

## 2014-03-21 NOTE — Progress Notes (Signed)
CSW received call from MD asking about SNF bed availability. Pt has a bed at [facility] and can discharge today. MD working on discharge summary and SNF will call CSW when they have insurance auth. FL2 signed by MD yesterday. Will coordinate with facility and medical team to discharge pt to [facility] today.   Maryclare Labrador, MSW, [facility] Clinical Social Worker [phone]

## 2014-03-21 NOTE — Progress Notes (Signed)
Patient is going to [facility].  Report given to Eden Emms, RN.

## 2014-03-25 LAB — GLUCOSE, CAPILLARY: GLUCOSE-CAPILLARY: 291 mg/dL — AB (ref 70–99)

## 2015-05-08 ENCOUNTER — Encounter
Admission: RE | Admit: 2015-05-08 | Discharge: 2015-05-08 | Disposition: A | Discharge status: Home / Self Care | Payer: Medicare Other | Source: Ambulatory Visit | Attending: Ophthalmology | Admitting: Ophthalmology

## 2015-05-08 DIAGNOSIS — Z01810 Encounter for preprocedural cardiovascular examination: Secondary | ICD-10-CM | POA: Insufficient documentation

## 2015-05-08 DIAGNOSIS — H2512 Age-related nuclear cataract, left eye: Secondary | ICD-10-CM | POA: Diagnosis not present

## 2015-05-08 DIAGNOSIS — K219 Gastro-esophageal reflux disease without esophagitis: Secondary | ICD-10-CM | POA: Diagnosis not present

## 2015-05-08 DIAGNOSIS — Z01812 Encounter for preprocedural laboratory examination: Secondary | ICD-10-CM | POA: Insufficient documentation

## 2015-05-08 DIAGNOSIS — E119 Type 2 diabetes mellitus without complications: Secondary | ICD-10-CM | POA: Diagnosis not present

## 2015-05-08 DIAGNOSIS — I10 Essential (primary) hypertension: Secondary | ICD-10-CM | POA: Diagnosis not present

## 2015-05-08 DIAGNOSIS — E780 Pure hypercholesterolemia: Secondary | ICD-10-CM | POA: Diagnosis not present

## 2015-05-08 DIAGNOSIS — Z87891 Personal history of nicotine dependence: Secondary | ICD-10-CM | POA: Diagnosis not present

## 2015-05-08 DIAGNOSIS — M1990 Unspecified osteoarthritis, unspecified site: Secondary | ICD-10-CM | POA: Diagnosis not present

## 2015-05-08 DIAGNOSIS — H9190 Unspecified hearing loss, unspecified ear: Secondary | ICD-10-CM | POA: Diagnosis not present

## 2015-05-08 LAB — POTASSIUM: Potassium: 4.3 mmol/L (ref 3.5–5.1)

## 2015-05-12 ENCOUNTER — Ambulatory Visit
Admission: RE | Admit: 2015-05-12 | Discharge: 2015-05-12 | Disposition: A | Discharge status: Home / Self Care | Payer: Medicare Other | Source: Ambulatory Visit | Attending: Ophthalmology | Admitting: Ophthalmology

## 2015-05-12 ENCOUNTER — Ambulatory Visit: Payer: Medicare Other | Admitting: Anesthesiology

## 2015-05-12 ENCOUNTER — Encounter
Admission: RE | Disposition: A | Discharge status: Home / Self Care | Payer: Self-pay | Source: Ambulatory Visit | Attending: Ophthalmology

## 2015-05-12 ENCOUNTER — Encounter: Payer: Self-pay | Admitting: Anesthesiology

## 2015-05-12 DIAGNOSIS — H2512 Age-related nuclear cataract, left eye: Secondary | ICD-10-CM | POA: Diagnosis not present

## 2015-05-12 DIAGNOSIS — M1990 Unspecified osteoarthritis, unspecified site: Secondary | ICD-10-CM | POA: Insufficient documentation

## 2015-05-12 DIAGNOSIS — Z87891 Personal history of nicotine dependence: Secondary | ICD-10-CM | POA: Insufficient documentation

## 2015-05-12 DIAGNOSIS — E119 Type 2 diabetes mellitus without complications: Secondary | ICD-10-CM | POA: Insufficient documentation

## 2015-05-12 DIAGNOSIS — K219 Gastro-esophageal reflux disease without esophagitis: Secondary | ICD-10-CM | POA: Insufficient documentation

## 2015-05-12 DIAGNOSIS — I10 Essential (primary) hypertension: Secondary | ICD-10-CM | POA: Insufficient documentation

## 2015-05-12 DIAGNOSIS — E780 Pure hypercholesterolemia: Secondary | ICD-10-CM | POA: Insufficient documentation

## 2015-05-12 DIAGNOSIS — H9190 Unspecified hearing loss, unspecified ear: Secondary | ICD-10-CM | POA: Insufficient documentation

## 2015-05-12 HISTORY — PX: CATARACT EXTRACTION W/PHACO: SHX586

## 2015-05-12 LAB — GLUCOSE, CAPILLARY: GLUCOSE-CAPILLARY: 143 mg/dL — AB (ref 65–99)

## 2015-05-12 SURGERY — PHACOEMULSIFICATION, CATARACT, WITH IOL INSERTION
Anesthesia: General | Site: Eye | Laterality: Left | Wound class: Clean

## 2015-05-12 MED ORDER — CEFUROXIME OPHTHALMIC INJECTION 1 MG/0.1 ML
INJECTION | OPHTHALMIC | Status: AC
  Filled 2015-05-12: qty 0.1

## 2015-05-12 MED ORDER — POVIDONE-IODINE 5 % OP SOLN
OPHTHALMIC | Status: AC
  Filled 2015-05-12: qty 30

## 2015-05-12 MED ORDER — EPINEPHRINE HCL 1 MG/ML IJ SOLN
INTRAOCULAR | Status: DC | PRN
  Administered 2015-05-12: 200 mL

## 2015-05-12 MED ORDER — SODIUM CHLORIDE 0.9 % IV SOLN
INTRAVENOUS | Status: DC
  Administered 2015-05-12 (×2): via INTRAVENOUS

## 2015-05-12 MED ORDER — ARMC OPHTHALMIC DILATING GEL
1.0000 "application " | OPHTHALMIC | Status: AC | PRN
  Administered 2015-05-12 (×2): 1 via OPHTHALMIC

## 2015-05-12 MED ORDER — MOXIFLOXACIN HCL 0.5 % OP SOLN
OPHTHALMIC | Status: AC
  Filled 2015-05-12: qty 3

## 2015-05-12 MED ORDER — MOXIFLOXACIN HCL 0.5 % OP SOLN
OPHTHALMIC | Status: DC | PRN
  Administered 2015-05-12: 1 [drp] via OPHTHALMIC

## 2015-05-12 MED ORDER — MIDAZOLAM HCL 2 MG/2ML IJ SOLN
INTRAMUSCULAR | Status: DC | PRN
  Administered 2015-05-12 (×2): 1 mg via INTRAVENOUS

## 2015-05-12 MED ORDER — MOXIFLOXACIN HCL 0.5 % OP SOLN: 1.0000 [drp] | OPHTHALMIC | Status: AC | PRN

## 2015-05-12 MED ORDER — POVIDONE-IODINE 5 % OP SOLN
1.0000 "application " | OPHTHALMIC | Status: AC | PRN
  Administered 2015-05-12: 1 via OPHTHALMIC

## 2015-05-12 MED ORDER — TETRACAINE HCL 0.5 % OP SOLN
1.0000 [drp] | OPHTHALMIC | Status: AC | PRN
  Administered 2015-05-12: 1 [drp] via OPHTHALMIC

## 2015-05-12 MED ORDER — FENTANYL CITRATE (PF) 100 MCG/2ML IJ SOLN
INTRAMUSCULAR | Status: DC | PRN
  Administered 2015-05-12: 50 ug via INTRAVENOUS

## 2015-05-12 MED ORDER — TETRACAINE HCL 0.5 % OP SOLN
OPHTHALMIC | Status: AC
  Filled 2015-05-12: qty 2

## 2015-05-12 MED ORDER — CARBACHOL 0.01 % IO SOLN
INTRAOCULAR | Status: DC | PRN
  Administered 2015-05-12: 0.5 mL via INTRAOCULAR

## 2015-05-12 MED ORDER — ARMC OPHTHALMIC DILATING GEL
OPHTHALMIC | Status: AC
  Filled 2015-05-12: qty 0.25

## 2015-05-12 MED ORDER — NA CHONDROIT SULF-NA HYALURON 40-17 MG/ML IO SOLN
INTRAOCULAR | Status: AC
  Filled 2015-05-12: qty 1

## 2015-05-12 MED ORDER — EPINEPHRINE HCL 1 MG/ML IJ SOLN
INTRAMUSCULAR | Status: AC
  Filled 2015-05-12: qty 1

## 2015-05-12 SURGICAL SUPPLY — 20 items
CANNULA ANT/CHMB 27GA (MISCELLANEOUS) ×2 IMPLANT
GLOVE BIO SURGEON STRL SZ8 (GLOVE) ×2 IMPLANT
GLOVE BIOGEL M 6.5 STRL (GLOVE) ×2 IMPLANT
GLOVE SURG LX 8.0 MICRO (GLOVE) ×1
GLOVE SURG LX STRL 8.0 MICRO (GLOVE) ×1 IMPLANT
GOWN STRL REUS W/ TWL LRG LVL3 (GOWN DISPOSABLE) ×2 IMPLANT
GOWN STRL REUS W/TWL LRG LVL3 (GOWN DISPOSABLE) ×2
LENS IOL TECNIS 19.0 (Intraocular Lens) ×2 IMPLANT
LENS IOL TECNIS MONO 1P 19.0 (Intraocular Lens) ×1 IMPLANT
PACK CATARACT (MISCELLANEOUS) ×2 IMPLANT
PACK CATARACT BRASINGTON LX (MISCELLANEOUS) ×2 IMPLANT
PACK EYE AFTER SURG (MISCELLANEOUS) ×2 IMPLANT
SOL BSS BAG (MISCELLANEOUS) ×2
SOL PREP PVP 2OZ (MISCELLANEOUS) ×2
SOLUTION BSS BAG (MISCELLANEOUS) ×1 IMPLANT
SOLUTION PREP PVP 2OZ (MISCELLANEOUS) ×1 IMPLANT
SYR 5ML LL (SYRINGE) ×2 IMPLANT
SYR TB 1ML 27GX1/2 LL (SYRINGE) ×2 IMPLANT
WATER STERILE IRR 1000ML POUR (IV SOLUTION) ×2 IMPLANT
WIPE NON LINTING 3.25X3.25 (MISCELLANEOUS) ×2 IMPLANT

## 2015-05-12 NOTE — Anesthesia Preprocedure Evaluation (Signed)
Anesthesia Evaluation  Patient identified by MRN, date of birth, ID band Patient awake    Reviewed: Allergy & Precautions, NPO status , Patient's Chart, lab work & pertinent test results, reviewed documented beta blocker date and time   Airway Mallampati: II  TM Distance: >3 FB     Dental  (+) Chipped   Pulmonary former smoker,          Cardiovascular hypertension,     Neuro/Psych    GI/Hepatic   Endo/Other  diabetes  Renal/GU      Musculoskeletal   Abdominal   Peds  Hematology   Anesthesia Other Findings   Reproductive/Obstetrics                             Anesthesia Physical Anesthesia Plan  ASA: II  Anesthesia Plan: General   Post-op Pain Management:    Induction: Intravenous  Airway Management Planned: Oral ETT  Additional Equipment:   Intra-op Plan:   Post-operative Plan:   Informed Consent: I have reviewed the patients History and Physical, chart, labs and discussed the procedure including the risks, benefits and alternatives for the proposed anesthesia with the patient or authorized representative who has indicated his/her understanding and acceptance.     Plan Discussed with: CRNA  Anesthesia Plan Comments:         Anesthesia Quick Evaluation

## 2015-05-12 NOTE — Anesthesia Procedure Notes (Signed)
Procedure Name: MAC Date/Time: 05/12/2015 9:41 AM Performed by: Junious SilkNOLES, Tony Ha Pre-anesthesia Checklist: Patient identified, Emergency Drugs available, Suction available, Patient being monitored and Timeout performed Oxygen Delivery Method: Nasal cannula

## 2015-05-12 NOTE — Anesthesia Postprocedure Evaluation (Signed)
  Anesthesia Post-op Note  Patient: Tony BucyDonald R Kilner  Procedure(s) Performed: Procedure(s) with comments: CATARACT EXTRACTION PHACO AND INTRAOCULAR LENS PLACEMENT (IOC) (Left) - US 01:53.1 AP% 21.4 CDE 24.30 fluid pack lot # [id number]  Anesthesia type:General  Patient location: PACU  Post pain: Pain level controlled  Post assessment: Post-op Vital signs reviewed, Patient's Cardiovascular Status Stable, Respiratory Function Stable, Patent Airway and No signs of Nausea or vomiting  Post vital signs: Reviewed and stable  Last Vitals:  Filed Vitals:   05/12/15 0806  BP: 138/74  Pulse: 81  Temp: 36.9 C  Resp: 18    Level of consciousness: awake, alert  and patient cooperative  Complications: No apparent anesthesia complications

## 2015-05-12 NOTE — Addendum Note (Signed)
Addendum  created 05/12/15 1000 by Junious SilkMark Brayton Baumgartner, CRNA   Modules edited: Anesthesia Medication Administration

## 2015-05-12 NOTE — H&P (Signed)
  All labs reviewed. Abnormal studies sent to patients PCP when indicated.  Previous H&P reviewed, patient examined, there are NO CHANGES.  Robt Okuda LOUIS7/12/20169:28 AM

## 2015-05-12 NOTE — Op Note (Signed)
PREOPERATIVE DIAGNOSIS:  Nuclear sclerotic cataract of the left eye.   POSTOPERATIVE DIAGNOSIS:  Nuclear sclerotic cataract of the left eye.   OPERATIVE PROCEDURE: Procedure(s): CATARACT EXTRACTION PHACO AND INTRAOCULAR LENS PLACEMENT (IOC)   SURGEON:  Galen ManilaWilliam Andry Bogden, MD.   ANESTHESIA:  Anesthesiologist: Berdine AddisonMathai Thomas, MD CRNA: Junious SilkMark [name], CRNA  1.      Managed anesthesia care. 2.      Topical tetracaine drops followed by 2% Xylocaine jelly applied in the preoperative holding area.   COMPLICATIONS:  None.   TECHNIQUE:   Stop and chop   DESCRIPTION OF PROCEDURE:  The patient was examined and consented in the preoperative holding area where the aforementioned topical anesthesia was applied to the left eye and then brought back to the Operating Room where the left eye was prepped and draped in the usual sterile ophthalmic fashion and a lid speculum was placed. A paracentesis was created with the side port blade and the anterior chamber was filled with viscoelastic. A near clear corneal incision was performed with the steel keratome. A continuous curvilinear capsulorrhexis was performed with a cystotome followed by the capsulorrhexis forceps. Hydrodissection and hydrodelineation were carried out with BSS on a blunt cannula. The lens was removed in a stop and chop  technique and the remaining cortical material was removed with the irrigation-aspiration handpiece. The capsular bag was inflated with viscoelastic and the Technis ZCB00 lens was placed in the capsular bag without complication. The remaining viscoelastic was removed from the eye with the irrigation-aspiration handpiece. The wounds were hydrated. The anterior chamber was flushed with Miostat and the eye was inflated to physiologic pressure. 0.2 mL of Vigamox diluted three/one with BSS was placed in the anterior chamber. The wounds were found to be water tight. The eye was dressed with Vigamox. The patient was given protective glasses to  wear throughout the day and a shield with which to sleep tonight. The patient was also given drops with which to begin a drop regimen today and will follow-up with me in one day.  Implant Name Type Inv. Item Serial No. Manufacturer Lot No. LRB No. Used  LENS IMPL INTRAOC ZCB00 19.0 - [id number] Intraocular Lens LENS IMPL INTRAOC ZCB00 19.0 [id number] AMO   Left 1    Procedure(s) with comments: CATARACT EXTRACTION PHACO AND INTRAOCULAR LENS PLACEMENT (IOC) (Left) - US 01:53.1 AP% 21.4 CDE 24.30 fluid pack lot # [id number]  Electronically signed: Tahiry Spicer [place] 05/12/2015 9:55 AM

## 2015-05-12 NOTE — Discharge Instructions (Signed)
AMBULATORY SURGERY  DISCHARGE INSTRUCTIONS   1) The drugs that you were given will stay in your system until tomorrow so for the next 24 hours you should not:  A) Drive an automobile B) Make any legal decisions C) Drink any alcoholic beverage   2) You may resume regular meals tomorrow.  Today it is better to start with liquids and gradually work up to solid foods.  You may eat anything you prefer, but it is better to start with liquids, then soup and crackers, and gradually work up to solid foods.   3) Please notify your doctor immediately if you have any unusual bleeding, trouble breathing, redness and pain at the surgery site, drainage, fever, or pain not relieved by medication.    4) Additional Instructions:        Please contact your physician with any problems or Same Day Surgery at [phone], Monday through Friday 6 am to 4 pm, or [place] at [facility] number at [phone].Cataract Surgery Care After Refer to this sheet in the next few weeks. These instructions provide you with information on caring for yourself after your procedure. Your caregiver may also give you more specific instructions. Your treatment has been planned according to current medical practices, but problems sometimes occur. Call your caregiver if you have any problems or questions after your procedure.  HOME CARE INSTRUCTIONS   Avoid strenuous activities as directed by your caregiver.  Ask your caregiver when you can resume driving.  Use eyedrops or other medicines to help healing and control pressure inside your eye as directed by your caregiver.  Only take over-the-counter or prescription medicines for pain, discomfort, or fever as directed by your caregiver.  Do not to touch or rub your eyes.  You may be instructed to use a protective shield during the first few days and nights after surgery. If not, wear sunglasses to protect your eyes. This is to protect the eye from pressure or  from being accidentally bumped.  Keep the area around your eye clean and dry. Avoid swimming or allowing water to hit you directly in the face while showering. Keep soap and shampoo out of your eyes.  Do not bend or lift heavy objects. Bending increases pressure in the eye. You can walk, climb stairs, and do light household chores.  Do not put a contact lens into the eye that had surgery until your caregiver says it is okay to do so.  Ask your doctor when you can return to work. This will depend on the kind of work that you do. If you work in a dusty environment, you may be advised to wear protective eyewear for a period of time.  Ask your caregiver when it will be safe to engage in sexual activity.  Continue with your regular eye exams as directed by your caregiver. What to expect:  It is normal to feel itching and mild discomfort for a few days after cataract surgery. Some fluid discharge is also common, and your eye may be sensitive to light and touch.  After 1 to 2 days, even moderate discomfort should disappear. In most cases, healing will take about 6 weeks.  If you received an intraocular lens (IOL), you may notice that colors are very bright or have a blue tinge. Also, if you have been in bright sunlight, everything may appear reddish for a few hours. If you see these color tinges, it is because your lens is clear and no longer cloudy. Within a few  months after receiving an IOL, these extra colors should go away. When you have healed, you will probably need new glasses. °SEEK MEDICAL CARE IF:  °· You have increased bruising around your eye. °· You have discomfort not helped by medicine. °SEEK IMMEDIATE MEDICAL CARE IF:  °· You have a  fever. °· You have a worsening or sudden vision loss. °· You have redness, swelling, or increasing pain in the eye. °· You have a thick discharge from the eye that had surgery. °MAKE SURE YOU: °· Understand these instructions. °· Will watch your  condition. °· Will get help right away if you are not doing well or get worse. °Document Released: 05/06/2005 Document Revised: 01/09/2012 Document Reviewed: 06/10/2011 °ExitCare® Patient Information ©2015 [employer]. This information is not intended to replace advice given to you by your health care provider. Make sure you discuss any questions you have with your health care provider. ° °

## 2015-05-12 NOTE — Transfer of Care (Signed)
Immediate Anesthesia Transfer of Care Note  Patient: [name]  Procedure(s) Performed: Procedure(s) with comments: CATARACT EXTRACTION PHACO AND INTRAOCULAR LENS PLACEMENT (IOC) (Left) - US 01:53.1 AP% 21.4 CDE 24.30 fluid pack lot # [id number]  Patient Location: PACU  Anesthesia Type:MAC  Level of Consciousness: awake, alert  and oriented  Airway & Oxygen Therapy: Patient Spontanous Breathing  Post-op Assessment: Report given to RN  Post vital signs: Reviewed, stable and unstable  Last Vitals:  Filed Vitals:   05/12/15 0806  BP: 138/74  Pulse: 81  Temp: 36.9 C  Resp: 18    Complications: No apparent anesthesia complications

## 2015-12-16 ENCOUNTER — Encounter
Admission: RE | Admit: 2015-12-16 | Discharge: 2015-12-16 | Disposition: A | Discharge status: Home / Self Care | Payer: Medicare Other | Source: Ambulatory Visit | Attending: Orthopedic Surgery | Admitting: Orthopedic Surgery

## 2015-12-16 DIAGNOSIS — I10 Essential (primary) hypertension: Secondary | ICD-10-CM | POA: Diagnosis not present

## 2015-12-16 DIAGNOSIS — Z01812 Encounter for preprocedural laboratory examination: Secondary | ICD-10-CM | POA: Insufficient documentation

## 2015-12-16 DIAGNOSIS — Z01810 Encounter for preprocedural cardiovascular examination: Secondary | ICD-10-CM | POA: Diagnosis present

## 2015-12-16 LAB — BASIC METABOLIC PANEL
Anion gap: 6 (ref 5–15)
BUN: 17 mg/dL (ref 6–20)
CALCIUM: 9.1 mg/dL (ref 8.9–10.3)
CO2: 29 mmol/L (ref 22–32)
Chloride: 104 mmol/L (ref 101–111)
Creatinine, Ser: 0.93 mg/dL (ref 0.61–1.24)
GFR calc non Af Amer: >60 mL/min (ref >60)
GLUCOSE: 281 mg/dL — AB (ref 65–99)
POTASSIUM: 4.4 mmol/L (ref 3.5–5.1)
Sodium: 139 mmol/L (ref 135–145)

## 2015-12-16 LAB — URINALYSIS COMPLETE WITH MICROSCOPIC (ARMC ONLY)
BACTERIA UA: NONE SEEN
Bilirubin Urine: NEGATIVE
Hgb urine dipstick: NEGATIVE
KETONES UR: NEGATIVE mg/dL
LEUKOCYTES UA: NEGATIVE
NITRITE: NEGATIVE
Protein, ur: NEGATIVE mg/dL
SQUAMOUS EPITHELIAL / LPF: NONE SEEN
Specific Gravity, Urine: 1.01 (ref 1.005–1.030)
WBC, UA: NONE SEEN WBC/hpf (ref 0–5)
pH: 6 (ref 5.0–8.0)

## 2015-12-16 LAB — HEMOGLOBIN A1C: Hgb A1c MFr Bld: 5.8 % (ref 4.0–6.0)

## 2015-12-16 LAB — CBC
HEMATOCRIT: 40.3 % (ref 40.0–52.0)
Hemoglobin: 13.5 g/dL (ref 13.0–18.0)
MCH: 30.6 pg (ref 26.0–34.0)
MCHC: 33.4 g/dL (ref 32.0–36.0)
MCV: 91.6 fL (ref 80.0–100.0)
Platelets: 164 10*3/uL (ref 150–440)
RBC: 4.4 MIL/uL (ref 4.40–5.90)
RDW: 14.8 % — ABNORMAL HIGH (ref 11.5–14.5)
WBC: 8.6 10*3/uL (ref 3.8–10.6)

## 2015-12-16 LAB — ABO/RH: ABO/RH(D): O POS

## 2015-12-16 LAB — PROTIME-INR
INR: 0.94
Prothrombin Time: 12.8 seconds (ref 11.4–15.0)

## 2015-12-16 LAB — TYPE AND SCREEN
ABO/RH(D): O POS
Antibody Screen: NEGATIVE

## 2015-12-16 LAB — SURGICAL PCR SCREEN
MRSA, PCR: POSITIVE — AB
STAPHYLOCOCCUS AUREUS: POSITIVE — AB

## 2015-12-16 LAB — SEDIMENTATION RATE: Sed Rate: 8 mm/hr (ref 0–20)

## 2015-12-16 LAB — APTT: aPTT: 31 seconds (ref 24–36)

## 2015-12-16 NOTE — Pre-Procedure Instructions (Signed)
EKG DISCUSSED WITH DR Pernell Dupre AND OK TO PROCEED

## 2015-12-16 NOTE — Pre-Procedure Instructions (Signed)
Positive MRSA results called and faxed to Dr. Ernest Pine office.

## 2015-12-16 NOTE — Patient Instructions (Signed)
  Your procedure is scheduled on: December 28, 2015 (Monday) Report to Day Surgery.[facility] Second Floor To find out your arrival time please call [phone] between 1PM - 3PM on December 25, 2015 (Friday).  Remember: Instructions that are not followed completely may result in serious medical risk, up to and including death, or upon the discretion of your surgeon and anesthesiologist your surgery may need to be rescheduled.    __x__ 1. Do not eat food or drink liquids after midnight. No gum chewing or hard candies.     __x__ 2. No Alcohol for 24 hours before or after surgery.   ____ 3. Bring all medications with you on the day of surgery if instructed.    __x__ 4. Notify your doctor if there is any change in your medical condition     (cold, fever, infections).     Do not wear jewelry, make-up, hairpins, clips or nail polish.  Do not wear lotions, powders, or perfumes. You may wear deodorant.  Do not shave 48 hours prior to surgery. Men may shave face and neck.  Do not bring valuables to the hospital.    [facility] is not responsible for any belongings or valuables.               Contacts, dentures or bridgework may not be worn into surgery.  Leave your suitcase in the car. After surgery it may be brought to your room.  For patients admitted to the hospital, discharge time is determined by your                treatment team.   Patients discharged the day of surgery will not be allowed to drive home.   Please read over the following fact sheets that you were given:   MRSA Information and Surgical Site Infection Prevention   ____ Take these medicines the morning of surgery with A SIP OF WATER:    1. Benazepril  2. Pantoprazole (Pantoprazole at bedtime on February 26)  3.   4.  5.  6.  ____ Fleet Enema (as directed)   _x___ Use CHG Soap as directed  ____ Use inhalers on the day of surgery  ____ Stop metformin 2 days prior to surgery     __x__ Take 1/2 of usual  insulin dose the night before surgery and none on the morning of surgery. (DO NOT TAKE ANY INSULIN THE MORNING OF YOUR SURGERY)  __x__ Stop Coumadin/Plavix/aspirin on (STOP ASPIRIN ONE WEEK BEFORE SURGERY)  __x__ Stop Anti-inflammatories on (STOP IBUPROFEN ONE WEEK BEFORE SURGERY) TYLENOL OK TO TAKE FOR PAIN IF NEEDED   __x_ Stop supplements until after surgery.  (STOP FISH OIL, VITAMIN B 6, AND GLUCOSAMINE NOW)   ____ Bring C-Pap to the hospital.

## 2015-12-17 LAB — URINE CULTURE

## 2015-12-28 ENCOUNTER — Inpatient Hospital Stay: Payer: Medicare Other | Admitting: Anesthesiology

## 2015-12-28 ENCOUNTER — Encounter
Admission: RE | Disposition: A | Discharge status: Home Health | Payer: Self-pay | Source: Ambulatory Visit | Attending: Orthopedic Surgery

## 2015-12-28 ENCOUNTER — Inpatient Hospital Stay
Admission: RE | Admit: 2015-12-28 | Discharge: 2015-12-30 | DRG: 470 | Disposition: A | Discharge status: Home Health | Payer: Medicare Other | Source: Ambulatory Visit | Attending: Orthopedic Surgery | Admitting: Orthopedic Surgery

## 2015-12-28 ENCOUNTER — Encounter: Payer: Self-pay | Admitting: Orthopedic Surgery

## 2015-12-28 ENCOUNTER — Inpatient Hospital Stay: Payer: Medicare Other

## 2015-12-28 DIAGNOSIS — Z79899 Other long term (current) drug therapy: Secondary | ICD-10-CM | POA: Diagnosis not present

## 2015-12-28 DIAGNOSIS — Z6832 Body mass index (BMI) 32.0-32.9, adult: Secondary | ICD-10-CM | POA: Diagnosis not present

## 2015-12-28 DIAGNOSIS — Z794 Long term (current) use of insulin: Secondary | ICD-10-CM | POA: Diagnosis not present

## 2015-12-28 DIAGNOSIS — Z96659 Presence of unspecified artificial knee joint: Secondary | ICD-10-CM

## 2015-12-28 DIAGNOSIS — K219 Gastro-esophageal reflux disease without esophagitis: Secondary | ICD-10-CM | POA: Diagnosis present

## 2015-12-28 DIAGNOSIS — Z7982 Long term (current) use of aspirin: Secondary | ICD-10-CM

## 2015-12-28 DIAGNOSIS — I10 Essential (primary) hypertension: Secondary | ICD-10-CM | POA: Diagnosis present

## 2015-12-28 DIAGNOSIS — E119 Type 2 diabetes mellitus without complications: Secondary | ICD-10-CM | POA: Diagnosis present

## 2015-12-28 DIAGNOSIS — E7800 Pure hypercholesterolemia, unspecified: Secondary | ICD-10-CM | POA: Diagnosis present

## 2015-12-28 DIAGNOSIS — M1712 Unilateral primary osteoarthritis, left knee: Principal | ICD-10-CM | POA: Diagnosis present

## 2015-12-28 DIAGNOSIS — E669 Obesity, unspecified: Secondary | ICD-10-CM | POA: Diagnosis present

## 2015-12-28 DIAGNOSIS — E039 Hypothyroidism, unspecified: Secondary | ICD-10-CM | POA: Diagnosis present

## 2015-12-28 DIAGNOSIS — Z87891 Personal history of nicotine dependence: Secondary | ICD-10-CM

## 2015-12-28 DIAGNOSIS — G8929 Other chronic pain: Secondary | ICD-10-CM | POA: Diagnosis present

## 2015-12-28 HISTORY — PX: KNEE ARTHROPLASTY: SHX992

## 2015-12-28 LAB — GLUCOSE, CAPILLARY
GLUCOSE-CAPILLARY: 298 mg/dL — AB (ref 65–99)
GLUCOSE-CAPILLARY: 241 mg/dL — AB (ref 65–99)
Glucose-Capillary: 294 mg/dL — ABNORMAL HIGH (ref 65–99)

## 2015-12-28 SURGERY — ARTHROPLASTY, KNEE, TOTAL, USING IMAGELESS COMPUTER-ASSISTED NAVIGATION
Anesthesia: Spinal | Site: Knee | Laterality: Left | Wound class: Clean

## 2015-12-28 MED ORDER — INSULIN REGULAR HUMAN 100 UNIT/ML IJ SOLN
14.0000 [IU] | Freq: Once | INTRAMUSCULAR | Status: DC
  Filled 2015-12-28: qty 0.14

## 2015-12-28 MED ORDER — PHENOL 1.4 % MT LIQD: 1.0000 | OROMUCOSAL | Status: DC | PRN

## 2015-12-28 MED ORDER — ACETAMINOPHEN 10 MG/ML IV SOLN
1000.0000 mg | Freq: Four times a day (QID) | INTRAVENOUS | Status: AC
  Administered 2015-12-28 – 2015-12-29 (×4): 1000 mg via INTRAVENOUS
  Filled 2015-12-28 (×4): qty 100

## 2015-12-28 MED ORDER — FENTANYL CITRATE (PF) 100 MCG/2ML IJ SOLN
INTRAMUSCULAR | Status: AC
  Filled 2015-12-28: qty 2

## 2015-12-28 MED ORDER — SODIUM CHLORIDE 0.9 % IV SOLN
INTRAVENOUS | Status: DC
  Administered 2015-12-28 – 2015-12-29 (×2): via INTRAVENOUS

## 2015-12-28 MED ORDER — OMEGA-3-ACID ETHYL ESTERS 1 G PO CAPS
1.0000 g | ORAL_CAPSULE | Freq: Every day | ORAL | Status: DC
  Administered 2015-12-29 – 2015-12-30 (×2): 1 g via ORAL
  Filled 2015-12-28 (×2): qty 1

## 2015-12-28 MED ORDER — OXYCODONE HCL 5 MG PO TABS
5.0000 mg | ORAL_TABLET | ORAL | Status: DC | PRN
  Administered 2015-12-30 (×2): 5 mg via ORAL
  Filled 2015-12-28 (×2): qty 1

## 2015-12-28 MED ORDER — NON FORMULARY
0.0000 [IU] | Freq: Three times a day (TID) | Status: DC
  Administered 2015-12-28: 5 [IU] via SUBCUTANEOUS

## 2015-12-28 MED ORDER — INSULIN REGULAR HUMAN 100 UNIT/ML IJ SOLN
0.0000 [IU] | Freq: Three times a day (TID) | INTRAMUSCULAR | Status: DC
  Administered 2015-12-29: 5 [IU] via SUBCUTANEOUS
  Administered 2015-12-29: 15 [IU] via SUBCUTANEOUS
  Administered 2015-12-29: 11 [IU] via SUBCUTANEOUS
  Administered 2015-12-30: 6 [IU] via SUBCUTANEOUS
  Filled 2015-12-28 (×6): qty 0.15

## 2015-12-28 MED ORDER — MAGNESIUM HYDROXIDE 400 MG/5ML PO SUSP: 30.0000 mL | Freq: Every day | ORAL | Status: DC | PRN

## 2015-12-28 MED ORDER — CELECOXIB 200 MG PO CAPS
200.0000 mg | ORAL_CAPSULE | Freq: Two times a day (BID) | ORAL | Status: DC
  Administered 2015-12-28 – 2015-12-30 (×4): 200 mg via ORAL
  Filled 2015-12-28 (×4): qty 1

## 2015-12-28 MED ORDER — ACETAMINOPHEN 10 MG/ML IV SOLN
INTRAVENOUS | Status: DC | PRN
  Administered 2015-12-28: 1000 mg via INTRAVENOUS

## 2015-12-28 MED ORDER — SODIUM CHLORIDE FLUSH 0.9 % IV SOLN
INTRAVENOUS | Status: AC
  Filled 2015-12-28: qty 10

## 2015-12-28 MED ORDER — BUPIVACAINE-EPINEPHRINE (PF) 0.25% -1:200000 IJ SOLN
INTRAMUSCULAR | Status: AC
  Filled 2015-12-28: qty 30

## 2015-12-28 MED ORDER — METOPROLOL TARTRATE 1 MG/ML IV SOLN
INTRAVENOUS | Status: DC | PRN
  Administered 2015-12-28 (×2): 1 mg via INTRAVENOUS

## 2015-12-28 MED ORDER — MIDAZOLAM HCL 5 MG/5ML IJ SOLN
INTRAMUSCULAR | Status: DC | PRN
  Administered 2015-12-28 (×2): 2 mg via INTRAVENOUS

## 2015-12-28 MED ORDER — ACETAMINOPHEN 10 MG/ML IV SOLN
INTRAVENOUS | Status: AC
  Filled 2015-12-28: qty 100

## 2015-12-28 MED ORDER — PHENYLEPHRINE HCL 10 MG/ML IJ SOLN
INTRAMUSCULAR | Status: DC | PRN
  Administered 2015-12-28 (×5): 100 ug via INTRAVENOUS
  Administered 2015-12-28: 200 ug via INTRAVENOUS
  Administered 2015-12-28 (×2): 100 ug via INTRAVENOUS

## 2015-12-28 MED ORDER — PROPOFOL 500 MG/50ML IV EMUL
INTRAVENOUS | Status: DC | PRN
  Administered 2015-12-28: 75 ug/kg/min via INTRAVENOUS

## 2015-12-28 MED ORDER — ONDANSETRON HCL 4 MG/2ML IJ SOLN: 4.0000 mg | Freq: Once | INTRAMUSCULAR | Status: DC | PRN

## 2015-12-28 MED ORDER — KETAMINE HCL 50 MG/ML IJ SOLN
INTRAMUSCULAR | Status: DC | PRN
  Administered 2015-12-28: 50 mg via INTRAMUSCULAR

## 2015-12-28 MED ORDER — SODIUM CHLORIDE 0.9 % IV SOLN: 1000.0000 mg | INTRAVENOUS | Status: DC | PRN

## 2015-12-28 MED ORDER — VANCOMYCIN HCL IN DEXTROSE 1-5 GM/200ML-% IV SOLN
INTRAVENOUS | Status: AC
  Administered 2015-12-28: 1000 mg via INTRAVENOUS
  Filled 2015-12-28: qty 200

## 2015-12-28 MED ORDER — HYDROMORPHONE HCL 1 MG/ML IJ SOLN
INTRAMUSCULAR | Status: AC
  Filled 2015-12-28: qty 2

## 2015-12-28 MED ORDER — BENAZEPRIL HCL 20 MG PO TABS
40.0000 mg | ORAL_TABLET | Freq: Every day | ORAL | Status: DC
  Administered 2015-12-29 – 2015-12-30 (×2): 40 mg via ORAL
  Filled 2015-12-28 (×2): qty 2

## 2015-12-28 MED ORDER — ONDANSETRON HCL 4 MG/2ML IJ SOLN: 4.0000 mg | Freq: Four times a day (QID) | INTRAMUSCULAR | Status: DC | PRN

## 2015-12-28 MED ORDER — INSULIN REGULAR HUMAN 100 UNIT/ML IJ SOLN
7.0000 [IU] | Freq: Once | INTRAMUSCULAR | Status: AC
  Administered 2015-12-28: 7 [IU] via SUBCUTANEOUS
  Filled 2015-12-28: qty 0.07

## 2015-12-28 MED ORDER — INSULIN GLARGINE 100 UNIT/ML ~~LOC~~ SOLN
58.0000 [IU] | SUBCUTANEOUS | Status: DC
  Administered 2015-12-29 – 2015-12-30 (×2): 58 [IU] via SUBCUTANEOUS
  Filled 2015-12-28 (×2): qty 0.58

## 2015-12-28 MED ORDER — NEOMYCIN-POLYMYXIN B GU 40-200000 IR SOLN
Status: AC
  Filled 2015-12-28: qty 20

## 2015-12-28 MED ORDER — SODIUM CHLORIDE 0.9 % IV SOLN
INTRAVENOUS | Status: DC
  Administered 2015-12-28 (×4): via INTRAVENOUS

## 2015-12-28 MED ORDER — BISACODYL 10 MG RE SUPP: 10.0000 mg | Freq: Every day | RECTAL | Status: DC | PRN

## 2015-12-28 MED ORDER — ATORVASTATIN CALCIUM 20 MG PO TABS
40.0000 mg | ORAL_TABLET | Freq: Every day | ORAL | Status: DC
  Administered 2015-12-28 – 2015-12-30 (×3): 40 mg via ORAL
  Filled 2015-12-28 (×4): qty 2

## 2015-12-28 MED ORDER — BUPIVACAINE LIPOSOME 1.3 % IJ SUSP
INTRAMUSCULAR | Status: AC
  Filled 2015-12-28: qty 20

## 2015-12-28 MED ORDER — PANTOPRAZOLE SODIUM 40 MG PO TBEC
40.0000 mg | DELAYED_RELEASE_TABLET | Freq: Two times a day (BID) | ORAL | Status: DC
  Administered 2015-12-28 – 2015-12-30 (×4): 40 mg via ORAL
  Filled 2015-12-28 (×4): qty 1

## 2015-12-28 MED ORDER — HYDROMORPHONE HCL 1 MG/ML IJ SOLN
0.2500 mg | INTRAMUSCULAR | Status: DC | PRN
  Administered 2015-12-28 (×2): 0.5 mg via INTRAVENOUS

## 2015-12-28 MED ORDER — NON FORMULARY: 7.0000 [IU] | Freq: Once | Status: DC

## 2015-12-28 MED ORDER — VITAMIN D 1000 UNITS PO TABS
1000.0000 [IU] | ORAL_TABLET | Freq: Every day | ORAL | Status: DC
  Administered 2015-12-29 – 2015-12-30 (×2): 1000 [IU] via ORAL
  Filled 2015-12-28 (×2): qty 1

## 2015-12-28 MED ORDER — DIPHENHYDRAMINE HCL 12.5 MG/5ML PO ELIX: 12.5000 mg | ORAL_SOLUTION | ORAL | Status: DC | PRN

## 2015-12-28 MED ORDER — METOCLOPRAMIDE HCL 10 MG PO TABS
10.0000 mg | ORAL_TABLET | Freq: Three times a day (TID) | ORAL | Status: DC
  Administered 2015-12-28 – 2015-12-30 (×6): 10 mg via ORAL
  Filled 2015-12-28 (×6): qty 1

## 2015-12-28 MED ORDER — ALUM & MAG HYDROXIDE-SIMETH 200-200-20 MG/5ML PO SUSP: 30.0000 mL | ORAL | Status: DC | PRN

## 2015-12-28 MED ORDER — ACETAMINOPHEN 325 MG PO TABS: 650.0000 mg | ORAL_TABLET | Freq: Four times a day (QID) | ORAL | Status: DC | PRN

## 2015-12-28 MED ORDER — SENNOSIDES-DOCUSATE SODIUM 8.6-50 MG PO TABS
1.0000 | ORAL_TABLET | Freq: Two times a day (BID) | ORAL | Status: DC
  Administered 2015-12-28 – 2015-12-30 (×4): 1 via ORAL
  Filled 2015-12-28 (×4): qty 1

## 2015-12-28 MED ORDER — INSULIN REGULAR HUMAN 100 UNIT/ML IJ SOLN
14.0000 [IU] | Freq: Two times a day (BID) | INTRAMUSCULAR | Status: DC
  Administered 2015-12-29 – 2015-12-30 (×3): 14 [IU] via SUBCUTANEOUS
  Filled 2015-12-28: qty 0.14

## 2015-12-28 MED ORDER — MENTHOL 3 MG MT LOZG: 1.0000 | LOZENGE | OROMUCOSAL | Status: DC | PRN

## 2015-12-28 MED ORDER — SODIUM CHLORIDE 0.9 % IV SOLN
INTRAVENOUS | Status: DC | PRN
  Administered 2015-12-28: 60 mL

## 2015-12-28 MED ORDER — SODIUM CHLORIDE 0.9 % IV SOLN
1500.0000 mg | INTRAVENOUS | Status: DC | PRN
  Administered 2015-12-28: 1500 mg via INTRAVENOUS

## 2015-12-28 MED ORDER — ONDANSETRON HCL 4 MG PO TABS: 4.0000 mg | ORAL_TABLET | Freq: Four times a day (QID) | ORAL | Status: DC | PRN

## 2015-12-28 MED ORDER — VITAMIN B-6 50 MG PO TABS
100.0000 mg | ORAL_TABLET | Freq: Every day | ORAL | Status: DC
  Administered 2015-12-29: 100 mg via ORAL
  Filled 2015-12-28 (×3): qty 2

## 2015-12-28 MED ORDER — FERROUS SULFATE 325 (65 FE) MG PO TABS
325.0000 mg | ORAL_TABLET | Freq: Two times a day (BID) | ORAL | Status: DC
  Administered 2015-12-29 – 2015-12-30 (×3): 325 mg via ORAL
  Filled 2015-12-28 (×3): qty 1

## 2015-12-28 MED ORDER — SODIUM CHLORIDE 0.9 % IV SOLN
1000.0000 mg | Freq: Once | INTRAVENOUS | Status: AC
  Administered 2015-12-28: 1000 mg via INTRAVENOUS
  Filled 2015-12-28: qty 10

## 2015-12-28 MED ORDER — ESMOLOL HCL 100 MG/10ML IV SOLN
INTRAVENOUS | Status: DC | PRN
  Administered 2015-12-28: 10 mg via INTRAVENOUS

## 2015-12-28 MED ORDER — KETOROLAC TROMETHAMINE 15 MG/ML IJ SOLN
15.0000 mg | Freq: Four times a day (QID) | INTRAMUSCULAR | Status: DC
  Administered 2015-12-29 (×2): 15 mg via INTRAVENOUS
  Filled 2015-12-28 (×2): qty 1

## 2015-12-28 MED ORDER — HYDROCHLOROTHIAZIDE 25 MG PO TABS
25.0000 mg | ORAL_TABLET | Freq: Every day | ORAL | Status: DC
  Administered 2015-12-29 – 2015-12-30 (×2): 25 mg via ORAL
  Filled 2015-12-28 (×2): qty 1

## 2015-12-28 MED ORDER — THYROID 60 MG PO TABS
120.0000 mg | ORAL_TABLET | Freq: Every day | ORAL | Status: DC
  Administered 2015-12-29 – 2015-12-30 (×2): 120 mg via ORAL
  Filled 2015-12-28 (×2): qty 2

## 2015-12-28 MED ORDER — SODIUM CHLORIDE 0.9 % IJ SOLN
INTRAMUSCULAR | Status: AC
  Filled 2015-12-28: qty 50

## 2015-12-28 MED ORDER — NEOMYCIN-POLYMYXIN B GU 40-200000 IR SOLN
Status: DC | PRN
  Administered 2015-12-28: 14 mL

## 2015-12-28 MED ORDER — MORPHINE SULFATE (PF) 2 MG/ML IV SOLN: 2.0000 mg | INTRAVENOUS | Status: DC | PRN

## 2015-12-28 MED ORDER — TRAMADOL HCL 50 MG PO TABS: 50.0000 mg | ORAL_TABLET | ORAL | Status: DC | PRN

## 2015-12-28 MED ORDER — TRANEXAMIC ACID 1000 MG/10ML IV SOLN
1500.0000 mg | INTRAVENOUS | Status: DC
  Filled 2015-12-28: qty 15

## 2015-12-28 MED ORDER — CLINDAMYCIN PHOSPHATE 900 MG/50ML IV SOLN: 900.0000 mg | Freq: Once | INTRAVENOUS | Status: DC

## 2015-12-28 MED ORDER — BUPIVACAINE IN DEXTROSE 0.75-8.25 % IT SOLN
INTRATHECAL | Status: DC | PRN
  Administered 2015-12-28: 1.6 mL via INTRATHECAL

## 2015-12-28 MED ORDER — ENOXAPARIN SODIUM 30 MG/0.3ML ~~LOC~~ SOLN
30.0000 mg | Freq: Two times a day (BID) | SUBCUTANEOUS | Status: DC
  Administered 2015-12-29 – 2015-12-30 (×3): 30 mg via SUBCUTANEOUS
  Filled 2015-12-28 (×3): qty 0.3

## 2015-12-28 MED ORDER — CLINDAMYCIN PHOSPHATE 600 MG/50ML IV SOLN
600.0000 mg | Freq: Four times a day (QID) | INTRAVENOUS | Status: AC
  Administered 2015-12-28 – 2015-12-29 (×4): 600 mg via INTRAVENOUS
  Filled 2015-12-28 (×4): qty 50

## 2015-12-28 MED ORDER — OXYCODONE HCL ER 10 MG PO T12A
10.0000 mg | EXTENDED_RELEASE_TABLET | Freq: Two times a day (BID) | ORAL | Status: DC
  Administered 2015-12-28 – 2015-12-30 (×4): 10 mg via ORAL
  Filled 2015-12-28 (×4): qty 1

## 2015-12-28 MED ORDER — FENTANYL CITRATE (PF) 100 MCG/2ML IJ SOLN
25.0000 ug | INTRAMUSCULAR | Status: AC | PRN
  Administered 2015-12-28 (×6): 25 ug via INTRAVENOUS

## 2015-12-28 MED ORDER — BUPIVACAINE-EPINEPHRINE 0.25% -1:200000 IJ SOLN
INTRAMUSCULAR | Status: DC | PRN
  Administered 2015-12-28: 30 mL

## 2015-12-28 MED ORDER — ACETAMINOPHEN 650 MG RE SUPP
650.0000 mg | Freq: Four times a day (QID) | RECTAL | Status: DC | PRN
Start: 2015-12-28 — End: 2015-12-30

## 2015-12-28 MED ORDER — INSULIN REGULAR HUMAN 100 UNIT/ML IJ SOLN
14.0000 [IU] | Freq: Two times a day (BID) | INTRAMUSCULAR | Status: DC
  Administered 2015-12-28: 14 [IU] via SUBCUTANEOUS
  Filled 2015-12-28 (×2): qty 0.14

## 2015-12-28 MED ORDER — CLINDAMYCIN PHOSPHATE 900 MG/50ML IV SOLN
INTRAVENOUS | Status: AC
  Administered 2015-12-28: 900 mg via INTRAVENOUS
  Filled 2015-12-28: qty 50

## 2015-12-28 MED ORDER — VANCOMYCIN HCL IN DEXTROSE 1-5 GM/200ML-% IV SOLN
1000.0000 mg | Freq: Once | INTRAVENOUS | Status: AC
  Administered 2015-12-28: 1000 mg via INTRAVENOUS

## 2015-12-28 MED ORDER — FLEET ENEMA 7-19 GM/118ML RE ENEM: 1.0000 | ENEMA | Freq: Once | RECTAL | Status: DC | PRN

## 2015-12-28 SURGICAL SUPPLY — 58 items
AUTOTRANSFUS HAS 1/8 (MISCELLANEOUS) ×2
BATTERY INSTRU NAVIGATION (MISCELLANEOUS) ×8 IMPLANT
BLADE SAW 1 (BLADE) ×2 IMPLANT
BLADE SAW 1/2 (BLADE) ×2 IMPLANT
BONE CEMENT GENTAMICIN (Cement) ×4 IMPLANT
CANISTER SUCT 1200ML W/VALVE (MISCELLANEOUS) ×2 IMPLANT
CANISTER SUCT 3000ML (MISCELLANEOUS) ×4 IMPLANT
CAP KNEE TOTAL 3 SIGMA ×2 IMPLANT
CATH TRAY METER 16FR LF (MISCELLANEOUS) ×2 IMPLANT
CEMENT BONE GENTAMICIN 40 (Cement) ×2 IMPLANT
COOLER POLAR GLACIER W/PUMP (MISCELLANEOUS) ×2 IMPLANT
DECANTER SPIKE VIAL GLASS SM (MISCELLANEOUS) ×4 IMPLANT
DRAPE SHEET LG 3/4 BI-LAMINATE (DRAPES) ×2 IMPLANT
DRSG DERMACEA 8X12 NADH (GAUZE/BANDAGES/DRESSINGS) ×2 IMPLANT
DRSG OPSITE POSTOP 4X14 (GAUZE/BANDAGES/DRESSINGS) ×2 IMPLANT
DRSG TEGADERM 4X4.75 (GAUZE/BANDAGES/DRESSINGS) ×2 IMPLANT
DURAPREP 26ML APPLICATOR (WOUND CARE) ×4 IMPLANT
ELECT CAUTERY BLADE 6.4 (BLADE) ×2 IMPLANT
ELECT REM PT RETURN 9FT ADLT (ELECTROSURGICAL) ×2
ELECTRODE REM PT RTRN 9FT ADLT (ELECTROSURGICAL) ×1 IMPLANT
EX-PIN ORTHOLOCK NAV 4X150 (PIN) ×4 IMPLANT
GLOVE BIOGEL M STRL SZ7.5 (GLOVE) ×4 IMPLANT
GLOVE INDICATOR 8.0 STRL GRN (GLOVE) ×2 IMPLANT
GLOVE SURG 9.0 ORTHO LTXF (GLOVE) ×2 IMPLANT
GLOVE SURG ORTHO 9.0 STRL STRW (GLOVE) ×2 IMPLANT
GOWN STRL REUS W/ TWL LRG LVL3 (GOWN DISPOSABLE) ×2 IMPLANT
GOWN STRL REUS W/TWL 2XL LVL3 (GOWN DISPOSABLE) ×2 IMPLANT
GOWN STRL REUS W/TWL LRG LVL3 (GOWN DISPOSABLE) ×2
HANDPIECE SUCTION TUBG SURGILV (MISCELLANEOUS) ×2 IMPLANT
HOLDER FOLEY CATH W/STRAP (MISCELLANEOUS) ×2 IMPLANT
HOOD PEEL AWAY FLYTE STAYCOOL (MISCELLANEOUS) ×4 IMPLANT
KIT RM TURNOVER STRD PROC AR (KITS) ×2 IMPLANT
KNIFE SCULPS 14X20 (INSTRUMENTS) ×2 IMPLANT
NDL SAFETY 18GX1.5 (NEEDLE) ×2 IMPLANT
NEEDLE SPNL 20GX3.5 QUINCKE YW (NEEDLE) ×2 IMPLANT
NS IRRIG 500ML POUR BTL (IV SOLUTION) ×2 IMPLANT
PACK TOTAL KNEE (MISCELLANEOUS) ×2 IMPLANT
PAD WRAPON POLAR KNEE (MISCELLANEOUS) ×1 IMPLANT
PIN DRILL QUICK PACK ×2 IMPLANT
PIN FIXATION 1/8DIA X 3INL (PIN) ×2 IMPLANT
SOL .9 NS 3000ML IRR  AL (IV SOLUTION) ×1
SOL .9 NS 3000ML IRR UROMATIC (IV SOLUTION) ×1 IMPLANT
SOL PREP PVP 2OZ (MISCELLANEOUS) ×2
SOLUTION PREP PVP 2OZ (MISCELLANEOUS) ×1 IMPLANT
SPONGE DRAIN TRACH 4X4 STRL 2S (GAUZE/BANDAGES/DRESSINGS) ×2 IMPLANT
STAPLER SKIN PROX 35W (STAPLE) ×2 IMPLANT
SUCTION FRAZIER HANDLE 10FR (MISCELLANEOUS) ×1
SUCTION TUBE FRAZIER 10FR DISP (MISCELLANEOUS) ×1 IMPLANT
SUT VIC AB 0 CT1 36 (SUTURE) ×2 IMPLANT
SUT VIC AB 1 CT1 36 (SUTURE) ×4 IMPLANT
SUT VIC AB 2-0 CT2 27 (SUTURE) ×2 IMPLANT
SYR 20CC LL (SYRINGE) ×2 IMPLANT
SYR 30ML LL (SYRINGE) ×2 IMPLANT
SYR 50ML LL SCALE MARK (SYRINGE) ×2 IMPLANT
SYSTEM AUTOTRANSFUS DUAL TROCR (MISCELLANEOUS) ×1 IMPLANT
TOWEL OR 17X26 4PK STRL BLUE (TOWEL DISPOSABLE) ×2 IMPLANT
TOWER CARTRIDGE SMART MIX (DISPOSABLE) ×2 IMPLANT
WRAPON POLAR PAD KNEE (MISCELLANEOUS) ×2

## 2015-12-28 NOTE — OR Nursing (Signed)
Dr Ernest Pine notified of BS 298 ordered Novolog 10 units - pt does not want to take Novolog. Dr Ernest Pine notified by [place] No of pts decision

## 2015-12-28 NOTE — H&P (Signed)
The patient has been re-examined, and the chart reviewed, and there have been no interval changes to the documented history and physical.    The risks, benefits, and alternatives have been discussed at length. The patient expressed understanding of the risks benefits and agreed with plans for surgical intervention.  James P. Hooten, Jr. M.D.    

## 2015-12-28 NOTE — OR Nursing (Signed)
Pharmacy does not carry Humulin R - Dr Maisie Fus notified - no other insulin ordered. pts wife can bring his insulin later if needed.

## 2015-12-28 NOTE — Op Note (Signed)
OPERATIVE NOTE  DATE OF SURGERY:  12/28/2015  PATIENT NAME:  [name]   DOB: January 03, 1941  MRN: [id number]  PRE-OPERATIVE DIAGNOSIS: Degenerative arthrosis of the left knee, primary  POST-OPERATIVE DIAGNOSIS:  Same  PROCEDURE:  Left total knee arthroplasty using computer-assisted navigation  SURGEON:  Jena Gauss. M.D.  ASSISTANT:  Van Clines, PA (present and scrubbed throughout the case, critical for assistance with exposure, retraction, instrumentation, and closure)  ANESTHESIA: spinal  ESTIMATED BLOOD LOSS: 75 mL  FLUIDS REPLACED: 2300 mL of crystalloid  TOURNIQUET TIME: 104 minutes  DRAINS: 2 medium drains to a reinfusion system  SOFT TISSUE RELEASES: Anterior cruciate ligament, posterior cruciate ligament, deep and superficial medial collateral ligament, patellofemoral ligament   IMPLANTS UTILIZED: DePuy PFC Sigma size 4 posterior stabilized femoral component (cemented), size 5 MBT tibial component (cemented), 41 mm 3 peg oval dome patella (cemented), and a 10 mm stabilized rotating platform polyethylene insert.  INDICATIONS FOR SURGERY: [name] is a 75 y.o. year old male with a long history of progressive knee pain. X-rays demonstrated severe degenerative changes in tricompartmental fashion. The patient had not seen any significant improvement despite conservative nonsurgical intervention. After discussion of the risks and benefits of surgical intervention, the patient expressed understanding of the risks benefits and agree with plans for total knee arthroplasty.   The risks, benefits, and alternatives were discussed at length including but not limited to the risks of infection, bleeding, nerve injury, stiffness, blood clots, the need for revision surgery, cardiopulmonary complications, among others, and they were willing to proceed.  PROCEDURE IN DETAIL: The patient was brought into the operating room and, after adequate spinal anesthesia was achieved, a  tourniquet was placed on the patient's upper thigh. The patient's knee and leg were cleaned and prepped with alcohol and DuraPrep and draped in the usual sterile fashion. A "timeout" was performed as per usual protocol. The lower extremity was exsanguinated using an Esmarch, and the tourniquet was inflated to 300 mmHg. An anterior longitudinal incision was made followed by a standard mid vastus approach. The deep fibers of the medial collateral ligament were elevated in a subperiosteal fashion off of the medial flare of the tibia so as to maintain a continuous soft tissue sleeve. The patella was subluxed laterally and the patellofemoral ligament was incised. Inspection of the knee demonstrated severe degenerative changes with full-thickness loss of articular cartilage. Osteophytes were debrided using a rongeur. Anterior and posterior cruciate ligaments were excised. Two 4.0 mm Schanz pins were inserted in the femur and into the tibia for attachment of the array of trackers used for computer-assisted navigation. Hip center was identified using a circumduction technique. Distal landmarks were mapped using the computer. The distal femur and proximal tibia were mapped using the computer. The distal femoral cutting guide was positioned using computer-assisted navigation so as to achieve a 5 distal valgus cut. The femur was sized and it was felt that a size 4 femoral component was appropriate. A size 4 femoral cutting guide was positioned and the anterior cut was performed and verified using the computer. This was followed by completion of the posterior and chamfer cuts. Femoral cutting guide for the central box was then positioned in the center box cut was performed.  Attention was then directed to the proximal tibia. Medial and lateral menisci were excised. The extramedullary tibial cutting guide was positioned using computer-assisted navigation so as to achieve a 0 varus-valgus alignment and 0 posterior slope. The  cut was performed  and verified using the computer. The proximal tibia was sized and it was felt that a size 5 tibial tray was appropriate. Tibial and femoral trials were inserted followed by insertion of a 10 mm polyethylene insert. The knee was felt to be tight medially. A Cobb elevator was used to elevate the superficial fibers of the medial collateral ligament.This allowed for excellent mediolateral soft tissue balancing both in flexion and in full extension. Finally, the patella was cut and prepared so as to accommodate a 41 mm 3 peg oval dome patella. A patella trial was placed and the knee was placed through a range of motion with excellent patellar tracking appreciated. The femoral trial was removed after debridement of posterior osteophytes. The central post-hole for the tibial component was reamed followed by insertion of a keel punch. Tibial trials were then removed. Cut surfaces of bone were irrigated with copious amounts of normal saline with antibiotic solution using pulsatile lavage and then suctioned dry. Polymethylmethacrylate cement with gentamicin was prepared in the usual fashion using a vacuum mixer. Cement was applied to the cut surface of the proximal tibia as well as along the undersurface of a size 5 MBT tibial component. Tibial component was positioned and impacted into place. Excess cement was removed using Personal assistant. Cement was then applied to the cut surfaces of the femur as well as along the posterior flanges of the size 4 femoral component. The femoral component was positioned and impacted into place. Excess cement was removed using Personal assistant. A 10 mm polyethylene trial was inserted and the knee was brought into full extension with steady axial compression applied. Finally, cement was applied to the backside of a 41 mm 3 peg oval dome patella and the patellar component was positioned and patellar clamp applied. Excess cement was removed using Personal assistant. After adequate  curing of the cement, the tourniquet was deflated after a total tourniquet time of 104 minutes. Hemostasis was achieved using electrocautery. The knee was irrigated with copious amounts of normal saline with antibiotic solution using pulsatile lavage and then suctioned dry. 20 mL of 1.3% Exparel in 40 mL of normal saline was injected along the posterior capsule, medial and lateral gutters, and along the arthrotomy site. A 10 mm stabilized rotating platform polyethylene insert was inserted and the knee was placed through a range of motion with excellent mediolateral soft tissue balancing appreciated and excellent patellar tracking noted. 2 medium drains were placed in the wound bed and brought out through separate stab incisions to be attached to a reinfusion system. The medial parapatellar portion of the incision was reapproximated using interrupted sutures of #1 Vicryl. Subcutaneous tissue was then injected with a total of 30 cc of 0.25% Marcaine with epinephrine. Subcutaneous tissue was approximated in layers using first #0 Vicryl followed #2-0 Vicryl. The skin was approximated with skin staples. A sterile dressing was applied.  The patient tolerated the procedure well and was transported to the recovery room in stable condition.    Matin Mattioli P. Angie Fava., M.D.

## 2015-12-28 NOTE — Anesthesia Preprocedure Evaluation (Addendum)
Anesthesia Evaluation  Patient identified by MRN, date of birth, ID band Patient awake    Reviewed: Allergy & Precautions, NPO status , Patient's Chart, lab work & pertinent test results, reviewed documented beta blocker date and time   Airway Mallampati: III  TM Distance: >3 FB     Dental  (+) Chipped   Pulmonary former smoker,           Cardiovascular hypertension, Pt. on medications      Neuro/Psych PSYCHIATRIC DISORDERS Anxiety    GI/Hepatic GERD  ,  Endo/Other  diabetesHypothyroidism   Renal/GU Renal InsufficiencyRenal disease     Musculoskeletal  (+) Arthritis ,   Abdominal   Peds  Hematology   Anesthesia Other Findings Obese. Syncope. EKG shows PVCs . He states he has had funny beats in the past. The syncope has been fro too much insulin.  Reproductive/Obstetrics                            Anesthesia Physical Anesthesia Plan  ASA: III  Anesthesia Plan: Spinal   Post-op Pain Management:    Induction:   Airway Management Planned:   Additional Equipment:   Intra-op Plan:   Post-operative Plan:   Informed Consent: I have reviewed the patients History and Physical, chart, labs and discussed the procedure including the risks, benefits and alternatives for the proposed anesthesia with the patient or authorized representative who has indicated his/her understanding and acceptance.     Plan Discussed with: CRNA  Anesthesia Plan Comments:         Anesthesia Quick Evaluation

## 2015-12-28 NOTE — Anesthesia Procedure Notes (Addendum)
Spinal Patient location during procedure: OR Staffing Anesthesiologist: Berdine Addison Performed by: anesthesiologist  Preanesthetic Checklist Completed: patient identified, site marked, surgical consent, pre-op evaluation, timeout performed, IV checked and risks and benefits discussed Spinal Block Patient position: sitting Prep: Betadine Patient monitoring: heart rate, cardiac monitor, continuous pulse ox and blood pressure Approach: midline Location: L3-4 Injection technique: single-shot Needle Needle type: Pencil-Tip  Needle gauge: 25 G Needle length: 9 cm Assessment Sensory level: T10 Additional Notes 1142 Marcaine 12.0 mg  Date/Time: 12/28/2015 12:15 PM Performed by: Junious Silk Pre-anesthesia Checklist: Patient identified, Emergency Drugs available, Suction available, Patient being monitored and Timeout performed Oxygen Delivery Method: Simple face mask

## 2015-12-28 NOTE — Brief Op Note (Signed)
12/28/2015  3:16 PM  PATIENT:  [name]  75 y.o. male  PRE-OPERATIVE DIAGNOSIS:  Degenerative arthrosis of the left knee  POST-OPERATIVE DIAGNOSIS:  Degenerative arthrosis left knee  PROCEDURE:  Procedure(s): COMPUTER ASSISTED TOTAL KNEE ARTHROPLASTY (Left)  SURGEON:  Surgeon(s) and Role:    * Donato Heinz, MD - Primary  ASSISTANTS: Van Clines, PA   ANESTHESIA:   spinal  EBL:  Total I/O In: 2000 [I.V.:2000] Out: 275 [Urine:200; Blood:75]  BLOOD ADMINISTERED:none  DRAINS: 2 medium drains to a reinfusion system   LOCAL MEDICATIONS USED:  MARCAINE    and OTHER  Exparel  SPECIMEN:  No Specimen  DISPOSITION OF SPECIMEN:  N/A  COUNTS:  YES  TOURNIQUET:  104 minutes  DICTATION: .Dragon Dictation  PLAN OF CARE: Admit to inpatient   PATIENT DISPOSITION:  PACU - hemodynamically stable.   Delay start of Pharmacological VTE agent (>24hrs) due to surgical blood loss or risk of bleeding: yes

## 2015-12-28 NOTE — Progress Notes (Signed)
Pt was able to get up out of the bed. Tolerated well. Already had post op BM this evening.

## 2015-12-28 NOTE — OR Nursing (Signed)
3 scratches noted on left knee upon arrival

## 2015-12-28 NOTE — Transfer of Care (Signed)
Immediate Anesthesia Transfer of Care Note  Patient: [name]  Procedure(s) Performed: Procedure(s): COMPUTER ASSISTED TOTAL KNEE ARTHROPLASTY (Left)  Patient Location: PACU  Anesthesia Type:Spinal  Level of Consciousness: awake, alert  and sedated  Airway & Oxygen Therapy: Patient Spontanous Breathing  Post-op Assessment: Report given to RN  Post vital signs: Reviewed and stable  Last Vitals:  Filed Vitals:   12/28/15 0927 12/28/15 1000  BP: 150/65   Pulse: 123 119  Temp: 36.5 C   Resp: 16     Complications: No apparent anesthesia complications

## 2015-12-28 NOTE — OR Nursing (Signed)
Dr Maisie Fus notified of BS 298 and pulse 125. Order received

## 2015-12-28 NOTE — OR Nursing (Signed)
Dr Ernest Pine in to talk with pt- will make sure pt has humulin R while in hosp.

## 2015-12-29 ENCOUNTER — Encounter: Payer: Self-pay | Admitting: Orthopedic Surgery

## 2015-12-29 LAB — BASIC METABOLIC PANEL
Anion gap: 6 (ref 5–15)
BUN: 18 mg/dL (ref 6–20)
CALCIUM: 8.2 mg/dL — AB (ref 8.9–10.3)
CO2: 25 mmol/L (ref 22–32)
Chloride: 110 mmol/L (ref 101–111)
Creatinine, Ser: 0.92 mg/dL (ref 0.61–1.24)
GFR calc Af Amer: >60 mL/min (ref >60)
GLUCOSE: 299 mg/dL — AB (ref 65–99)
POTASSIUM: 4.3 mmol/L (ref 3.5–5.1)
Sodium: 141 mmol/L (ref 135–145)

## 2015-12-29 LAB — CBC
HEMATOCRIT: 33.8 % — AB (ref 40.0–52.0)
Hemoglobin: 11.7 g/dL — ABNORMAL LOW (ref 13.0–18.0)
MCH: 31.3 pg (ref 26.0–34.0)
MCHC: 34.5 g/dL (ref 32.0–36.0)
MCV: 90.7 fL (ref 80.0–100.0)
PLATELETS: 146 10*3/uL — AB (ref 150–440)
RBC: 3.73 MIL/uL — ABNORMAL LOW (ref 4.40–5.90)
RDW: 14.7 % — AB (ref 11.5–14.5)
WBC: 8.2 10*3/uL (ref 3.8–10.6)

## 2015-12-29 LAB — GLUCOSE, CAPILLARY
GLUCOSE-CAPILLARY: 372 mg/dL — AB (ref 65–99)
GLUCOSE-CAPILLARY: 203 mg/dL — AB (ref 65–99)
GLUCOSE-CAPILLARY: 281 mg/dL — AB (ref 65–99)
Glucose-Capillary: 318 mg/dL — ABNORMAL HIGH (ref 65–99)

## 2015-12-29 NOTE — Evaluation (Signed)
Physical Therapy Evaluation Patient Details Name: [name] MRN: [id number] DOB: 1941-04-04 Today's Date: 12/29/2015   History of Present Illness  75 yo M presents after L TKR without complications. PMH includes HTN, DMII, angent orange exposure.  Clinical Impression  Pt presents with difficulty walking after L TKR POD #1. Pain is well controlled with no complaints during session. He performed SLR x10 with L LE demonstrating no requirement for KI. Pt is safe and steady with transfers and ambulation of 40 ft x2 with FWW and min guard. During bed mobility pt is mod I and uses rail or trapeze. After ambulation SpO2 94% RA and HR 111. AAROM L knee 8 to 92 degrees. Discussed knee precautions with pt with him verbalizing understanding. Pt will benefit from skilled PT services to increase functional I and mobility for safe discharge.     Follow Up Recommendations Home health PT    Equipment Recommendations  Other (comment) (Pt states he already has FWW.)    Recommendations for Other Services       Precautions / Restrictions Precautions Precautions: Fall;Other (comment);Knee (contact precautions) Precaution Comments: Performed SLR x10 with L LE, no KI required. Restrictions Weight Bearing Restrictions: Yes LLE Weight Bearing: Weight bearing as tolerated      Mobility  Bed Mobility Overal bed mobility: Modified Independent             General bed mobility comments: uses rail or trapeze  Transfers Overall transfer level: Needs assistance Equipment used: Rolling walker (2 wheeled) Transfers: Sit to/from [employer] Sit to Stand: Min guard Stand pivot transfers: Min guard       General transfer comment: demonstrated good safety awareness, steady, no LOB  Ambulation/Gait Ambulation/Gait assistance: Min guard Ambulation Distance (Feet): 40 Feet Assistive device: Rolling walker (2 wheeled) Gait Pattern/deviations: Step-to pattern;Decreased stride  length     General Gait Details: Ambulated 40 ft x2 with FWW and min guard.Steady, no LOB.  Stairs            Wheelchair Mobility    Modified Rankin (Stroke Patients Only)       Balance Overall balance assessment: Needs assistance Sitting-balance support: No upper extremity supported Sitting balance-Leahy Scale: Good Sitting balance - Comments: maintains independently   Standing balance support: Single extremity supported Standing balance-Leahy Scale: Fair Standing balance comment: no LOB, maintains static balance independently                             Pertinent Vitals/Pain Pain Assessment: No/denies pain    Home Living Family/patient expects to be discharged to:: Private residence Living Arrangements: Spouse/significant other Available Help at Discharge: Family Type of Home: House Home Access: Stairs to enter Entrance Stairs-Rails: None (can step onto with FWW) Entrance Stairs-Number of Steps: 1   Home Equipment: Walker - 2 wheels      Prior Function Level of Independence: Independent with assistive device(s)         Comments: ambulates I with FWW, I ADLs, drives     Hand Dominance        Extremity/Trunk Assessment   Upper Extremity Assessment: Overall WFL for tasks assessed           Lower Extremity Assessment: LLE deficits/detail   LLE Deficits / Details: Strength 4-/5     Communication   Communication: No difficulties  Cognition Arousal/Alertness: Awake/alert Behavior During Therapy: WFL for tasks assessed/performed Overall Cognitive Status: Within Functional Limits for tasks assessed  General Comments      Exercises Total Joint Exercises Goniometric ROM: L Knee AAROM 8 to 92 degrees Other Exercises Other Exercises: L LE therex: SLR, ankle pumps, QS, LAQ x10 each. VC for technique. No c/o increased pain.      Assessment/Plan    PT Assessment Patient needs continued PT services   PT Diagnosis Difficulty walking   PT Problem List Decreased strength;Decreased activity tolerance;Decreased balance;Decreased mobility  PT Treatment Interventions Gait training;Stair training;Therapeutic activities;Therapeutic exercise;Patient/family education   PT Goals (Current goals can be found in the Care Plan section) Acute Rehab PT Goals Patient Stated Goal: To get home. PT Goal Formulation: With patient Time For Goal Achievement: 01/12/16 Potential to Achieve Goals: Good    Frequency BID   Barriers to discharge   1 STE without rail    Co-evaluation               End of Session Equipment Utilized During Treatment: Gait belt Activity Tolerance: Patient tolerated treatment well;No increased pain Patient left: in bed;with call bell/phone within reach;with bed alarm set;with SCD's reapplied Nurse Communication: Mobility status         Time: 1610-9604 PT Time Calculation (min) (ACUTE ONLY): 35 min   Charges:   PT Evaluation $PT Eval Moderate Complexity: 1 Procedure PT Treatments $Therapeutic Exercise: 8-22 mins   PT G Codes:        Adelene Idler, PT, DPT  12/29/2015, 9:52 AM [phone]

## 2015-12-29 NOTE — Progress Notes (Signed)
Clinical Social Worker (CSW) received SNF consult. PT is recommending home health. RN Case Manager is aware of above. Please reconsult if future social work needs arise. CSW signing off.   Bellah Alia Morgan, LCSW [phone] 

## 2015-12-29 NOTE — Progress Notes (Signed)
   Subjective: 1 Day Post-Op Procedure(s) (LRB): COMPUTER ASSISTED TOTAL KNEE ARTHROPLASTY (Left) Patient reports pain as mild.   Patient is well, and has had no acute complaints or problems We will start therapy today.  Plan is to go Home after hospital stay. no nausea and no vomiting Patient denies any chest pains or shortness of breath. Objective: Vital signs in last 24 hours: Temp:  [97.4 F (36.3 C)-98.6 F (37 C)] 97.4 F (36.3 C) (02/28 0725) Pulse Rate:  [83-123] 86 (02/28 0725) Resp:  [15-23] 17 (02/28 0725) BP: (113-150)/(51-78) 138/62 mmHg (02/28 0725) SpO2:  [96 %-100 %] 96 % (02/28 0307) Weight:  [106.142 kg (234 lb)] 106.142 kg (234 lb) (02/27 0927) Has original dressing dressing on. No able to evaluate wound today Heels are non tender and elevated off the bed using rolled towels Intake/Output from previous day: 02/27 0701 - 02/28 0700 In: 4368.3 [I.V.:4018.3; IV Piggyback:350] Out: 2125 [Urine:1500; Drains:550; Blood:75] Intake/Output this shift:     Recent Labs  12/29/15 0607  HGB 11.7*    Recent Labs  12/29/15 0607  WBC 8.2  RBC 3.73*  HCT 33.8*  PLT 146*    Recent Labs  12/29/15 0607  NA 141  K 4.3  CL 110  CO2 25  BUN 18  CREATININE 0.92  GLUCOSE 299*  CALCIUM 8.2*   No results for input(s): LABPT, INR in the last 72 hours.  EXAM General - Patient is Alert, Appropriate and Oriented Extremity - Neurologically intact Neurovascular intact Sensation intact distally Intact pulses distally Dorsiflexion/Plantar flexion intact Dressing - dressing C/D/I Motor Function - intact, moving foot and toes well on exam.    Past Medical History  Diagnosis Date  . Hypertension   . Encephalopathy acute 08/16/2013  . High cholesterol   . Hypothyroidism   . Type II diabetes mellitus (HCC)   . Agent orange exposure     "[name]"  . GERD (gastroesophageal reflux disease)   . Arthritis     "left knee" (03/18/2014)  . Closed pelvic fracture  (HCC) 03/18/2014    "multiple S/P fall w/loss of consciousness; CBG 29"    Assessment/Plan: 1 Day Post-Op Procedure(s) (LRB): COMPUTER ASSISTED TOTAL KNEE ARTHROPLASTY (Left) Active Problems:   S/P total knee arthroplasty  Estimated body mass index is 32.65 kg/(m^2) as calculated from the following:   Height as of this encounter:  (1.803 m).   Weight as of this encounter: 106.142 kg (234 lb). Advance diet Up with therapy D/C IV fluids Plan for discharge tomorrow Discharge home with home health  Labs: reviewed DVT Prophylaxis - Lovenox, Foot Pumps and TED hose Weight-Bearing as tolerated to left leg D/C O2 and Pulse OX and try on Room Air Begin working on a bowel movement Labs in am  [employer]. [facility] 12/29/2015, 7:30 AM

## 2015-12-29 NOTE — Progress Notes (Signed)
Pt up to chair this morning and tolerating well. NO complaints of pain during the night. PRN pain meds offered through night and pt declined each time it was offered. Pain controled with scheduled Oxycontin, Toradol, and IV Tylenol. Pt was also able to have post op BM during the night.

## 2015-12-29 NOTE — Progress Notes (Signed)
Physical Therapy Treatment Patient Details Name: [name] MRN: [id number] DOB: [birth year] Today's Date: 12/29/2015    History of Present Illness 75 yo M presents after L TKR without complications. PMH includes HTN, DMII, angent orange exposure.    PT Comments    Pt was found to be up unassisted attempting to get from chair to bed and was educated extensively by nursing and PT of why that is unsafe due to IV pole/line, polar care and various obstacles in the room. He became very agitated but eventually calmed down and participated well with therapy. Pt ambulated 240 ft with FWW and min guard. Stair training completed with pt going up and down 1 step using FWW with min guard and cues for sequence. Bed mobility is mod I and transfers with supervision and FWW. He had no c/o pain during session. Contact precautions maintained throughout session.  Follow Up Recommendations  Home health PT     Equipment Recommendations       Recommendations for Other Services       Precautions / Restrictions Precautions Precautions: Fall;Other (comment);Knee Precaution Comments: Performed SLR x10 with L LE, no KI required. Restrictions Weight Bearing Restrictions: Yes LLE Weight Bearing: Weight bearing as tolerated    Mobility  Bed Mobility Overal bed mobility: Modified Independent             General bed mobility comments: uses rail or trapeze  Transfers Overall transfer level: Needs assistance Equipment used: Rolling walker (2 wheeled) Transfers: Sit to/from [employer] Transfers Sit to Stand: Supervision Stand pivot transfers: Supervision       General transfer comment: demonstrated good safety awareness, steady, no LOB  Ambulation/Gait Ambulation/Gait assistance: Min guard Ambulation Distance (Feet): 240 Feet Assistive device: Rolling walker (2 wheeled) Gait Pattern/deviations: Decreased stride length     General Gait Details: VC for L knee extension when weight  bearing, steady, no LOB   Stairs Stairs: Yes Stairs assistance: Min guard Stair Management: With walker Number of Stairs: 1 General stair comments: VC for sequence, no LOB  Wheelchair Mobility    Modified Rankin (Stroke Patients Only)       Balance Overall balance assessment: Needs assistance Sitting-balance support: No upper extremity supported;Feet unsupported Sitting balance-Leahy Scale: Good Sitting balance - Comments: maintains independently   Standing balance support: No upper extremity supported Standing balance-Leahy Scale: Fair Standing balance comment: no LOB                    Cognition Arousal/Alertness: Awake/alert Behavior During Therapy: Agitated Overall Cognitive Status: Within Functional Limits for tasks assessed                      Exercises      General Comments        Pertinent Vitals/Pain Pain Assessment: No/denies pain    Home Living Family/patient expects to be discharged to:: Private residence Living Arrangements: Spouse/significant other Available Help at Discharge: Family Type of Home: House Home Access: Stairs to enter Entrance Stairs-Rails: None   Home Equipment: Environmental consultant - 2 wheels      Prior Function Level of Independence: Independent with assistive device(s)      Comments: Independent, drives, uses a front wheeled walker.   PT Goals (current goals can now be found in the care plan section) Acute Rehab PT Goals Patient Stated Goal: to go home PT Goal Formulation: With patient Time For Goal Achievement: 01/12/16 Potential to Achieve Goals: Good Progress towards PT goals:  Progressing toward goals    Frequency  BID    PT Plan Current plan remains appropriate    Co-evaluation             End of Session Equipment Utilized During Treatment: Gait belt Activity Tolerance: Patient tolerated treatment well;No increased pain Patient left: in bed;with call bell/phone within reach;with bed alarm set;with  SCD's reapplied     Time: 1457-1521 PT Time Calculation (min) (ACUTE ONLY): 24 min  Charges:  $Gait Training: 8-22 mins $Therapeutic Activity: 8-22 mins                    G Codes:      Adelene Idler, PT, DPT  12/29/2015, 3:32 PM [phone]

## 2015-12-29 NOTE — NC FL2 (Signed)
[place] MEDICAID FL2 LEVEL OF CARE SCREENING TOOL     IDENTIFICATION  Patient Name: [name] Birthdate: 1941-03-09 Sex: male Admission Date (Current Location): 12/28/2015  [place] and [place] Number:  Producer, television/film/video and Address:  [facility], [address] [place], [place] [address]      Provider Number: [id number]  Attending Physician Name and Address:  Donato Heinz, MD  Relative Name and Phone Number:       Current Level of Care: Hospital Recommended Level of Care: Skilled Nursing Facility Prior Approval Number:    Date Approved/Denied:   PASRR Number:  ( [id number] )  Discharge Plan: SNF    Current Diagnoses: Patient Active Problem List   Diagnosis Date Noted  . S/P total knee arthroplasty 12/28/2015  . ARF (acute renal failure) (HCC) 03/21/2014  . CKD (chronic kidney disease) stage 3, GFR 30-59 ml/min 03/21/2014  . Syncope 03/19/2014  . Multiple closed fractures of pelvis (HCC) 03/18/2014  . Hypoglycemia 03/18/2014  . Pelvis fracture (HCC) 03/18/2014  . Acute encephalopathy 08/14/2013  . ANOMALY, POSTERIOR SOFT TISSUE IMPINGEMENT 10/10/2006  . OSTEOMYELITIS, HX OF 10/10/2006  . ONYCHOMYCOSIS 08/17/2006  . DIABETES MELLITUS, TYPE II 08/17/2006  . HYPERLIPIDEMIA 08/17/2006  . CATARACT NOS 08/17/2006  . HYPERTENSION 08/17/2006  . DIVERTICULOSIS, COLON 08/17/2006  . CELLULITIS, LEG, LEFT 08/17/2006  . OSTEOARTHRITIS 08/17/2006  . FRACTURE, ANKLE 08/17/2006  . COLONIC POLYPS, HX OF 08/17/2006    Orientation RESPIRATION BLADDER Height & Weight     Self, Time, Situation, Place  Normal Continent Weight: 234 lb (106.142 kg) Height:  5\' 11"  (180.3 cm)  BEHAVIORAL SYMPTOMS/MOOD NEUROLOGICAL BOWEL NUTRITION STATUS   (none )  (none ) Continent Diet (Diet: Carb Modified. )  AMBULATORY STATUS COMMUNICATION OF NEEDS Skin   Extensive Assist Verbally Surgical wounds (Incision: Left Knee. Incision: Left eye. )                        Personal Care Assistance Level of Assistance  Bathing, Feeding, Dressing Bathing Assistance: Limited assistance Feeding assistance: Independent Dressing Assistance: Limited assistance     Functional Limitations Info  Sight, Hearing, Speech Sight Info: Adequate Hearing Info: Impaired Speech Info: Adequate    SPECIAL CARE FACTORS FREQUENCY  PT (By licensed PT), OT (By licensed OT)     PT Frequency:  (5) OT Frequency:  (5)            Contractures      Additional Factors Info  Code Status, Allergies, Insulin Sliding Scale, Isolation Precautions Code Status Info:  (Full Code. ) Allergies Info:  (Insulins, Penicillins. )   Insulin Sliding Scale Info:  (Novolin Humulin 3 times daily ) Isolation Precautions Info:  (MRSA Nasal swab. )     Current Medications (12/29/2015):  This is the current hospital active medication list Current Facility-Administered Medications  Medication Dose Route Frequency Provider Last Rate Last Dose  . 0.9 %  sodium chloride infusion   Intravenous Continuous Donato Heinz, MD 100 mL/hr at 12/29/15 0515    . acetaminophen (OFIRMEV) IV 1,000 mg  1,000 mg Intravenous 4 times per day Donato Heinz, MD   1,000 mg at 12/29/15 0558  . acetaminophen (TYLENOL) tablet 650 mg  650 mg Oral Q6H PRN Donato Heinz, MD       Or  . acetaminophen (TYLENOL) suppository 650 mg  650 mg Rectal Q6H PRN Donato Heinz, MD      .  alum & mag hydroxide-simeth (MAALOX/MYLANTA) 200-200-20 MG/5ML suspension 30 mL  30 mL Oral Q4H PRN Donato Heinz, MD      . atorvastatin (LIPITOR) tablet 40 mg  40 mg Oral Daily Donato Heinz, MD   40 mg at 12/29/15 0813  . benazepril (LOTENSIN) tablet 40 mg  40 mg Oral Daily Donato Heinz, MD   40 mg at 12/29/15 0814  . bisacodyl (DULCOLAX) suppository 10 mg  10 mg Rectal Daily PRN Donato Heinz, MD      . celecoxib (CELEBREX) capsule 200 mg  200 mg Oral Q12H Donato Heinz, MD   200 mg at 12/29/15 0813  . cholecalciferol  (VITAMIN D) tablet 1,000 Units  1,000 Units Oral Daily Donato Heinz, MD   1,000 Units at 12/29/15 [phone]  . clindamycin (CLEOCIN) IVPB 600 mg  600 mg Intravenous Q6H Donato Heinz, MD   600 mg at 12/29/15 0532  . diphenhydrAMINE (BENADRYL) 12.5 MG/5ML elixir 12.5-25 mg  12.5-25 mg Oral Q4H PRN Donato Heinz, MD      . enoxaparin (LOVENOX) injection 30 mg  30 mg Subcutaneous Q12H Donato Heinz, MD   30 mg at 12/29/15 9604  . ferrous sulfate tablet 325 mg  325 mg Oral BID WC Donato Heinz, MD   325 mg at 12/29/15 0814  . hydrochlorothiazide (HYDRODIURIL) tablet 25 mg  25 mg Oral Daily Donato Heinz, MD   25 mg at 12/29/15 0813  . insulin glargine (LANTUS) injection 58 Units  58 Units Subcutaneous BH-q7a Donato Heinz, MD   58 Units at 12/29/15 [phone]  . insulin regular (NOVOLIN R,HUMULIN R) 100 units/mL injection 0-15 Units  0-15 Units Subcutaneous TID WC Donato Heinz, MD   11 Units at 12/29/15 0809  . insulin regular (NOVOLIN R,HUMULIN R) 100 units/mL injection 14 Units  14 Units Subcutaneous BID AC Donato Heinz, MD   14 Units at 12/29/15 [phone]  . insulin regular (NOVOLIN R,HUMULIN R) 100 units/mL injection 14 Units  14 Units Subcutaneous Once Donato Heinz, MD   14 Units at 12/28/15 2046  . magnesium hydroxide (MILK OF MAGNESIA) suspension 30 mL  30 mL Oral Daily PRN Donato Heinz, MD      . menthol-cetylpyridinium (CEPACOL) lozenge 3 mg  1 lozenge Oral PRN Donato Heinz, MD       Or  . phenol (CHLORASEPTIC) mouth spray 1 spray  1 spray Mouth/Throat PRN Donato Heinz, MD      . metoCLOPramide (REGLAN) tablet 10 mg  10 mg Oral TID AC & HS Donato Heinz, MD   10 mg at 12/29/15 0814  . morphine 2 MG/ML injection 2 mg  2 mg Intravenous Q2H PRN Donato Heinz, MD      . omega-3 acid ethyl esters (LOVAZA) capsule 1 g  1 g Oral Daily Donato Heinz, MD   1 g at 12/29/15 0813  . ondansetron (ZOFRAN) tablet 4 mg  4 mg Oral Q6H PRN Donato Heinz, MD       Or  . ondansetron (ZOFRAN) injection 4  mg  4 mg Intravenous Q6H PRN Donato Heinz, MD      . oxyCODONE (Oxy IR/ROXICODONE) immediate release tablet 5-10 mg  5-10 mg Oral Q4H PRN Donato Heinz, MD      . oxyCODONE (OXYCONTIN) 12 hr tablet 10 mg  10 mg Oral Q12H Donato Heinz, MD   10 mg at  12/29/15 8413  . pantoprazole (PROTONIX) EC tablet 40 mg  40 mg Oral BID Donato Heinz, MD   40 mg at 12/29/15 2440  . pyridOXINE (VITAMIN B-6) tablet 100 mg  100 mg Oral Daily Donato Heinz, MD   100 mg at 12/29/15 1027  . senna-docusate (Senokot-S) tablet 1 tablet  1 tablet Oral BID Donato Heinz, MD   1 tablet at 12/29/15 [phone]  . sodium phosphate (FLEET) 7-19 GM/118ML enema 1 enema  1 enema Rectal Once PRN Donato Heinz, MD      . thyroid (ARMOUR) tablet 120 mg  120 mg Oral QAC breakfast Donato Heinz, MD   120 mg at 12/29/15 0515  . traMADol (ULTRAM) tablet 50-100 mg  50-100 mg Oral Q4H PRN Donato Heinz, MD         Discharge Medications: Please see discharge summary for a list of discharge medications.  Relevant Imaging Results:  Relevant Lab Results:   Additional Information  (SSN: [id number])  Haig Prophet, LCSW

## 2015-12-29 NOTE — Anesthesia Postprocedure Evaluation (Signed)
Anesthesia Post Note  Patient: [name]  Procedure(s) Performed: Procedure(s) (LRB): COMPUTER ASSISTED TOTAL KNEE ARTHROPLASTY (Left)  Patient location during evaluation: PACU Anesthesia Type: General Level of consciousness: awake and alert Pain management: pain level controlled Vital Signs Assessment: post-procedure vital signs reviewed and stable Respiratory status: spontaneous breathing, nonlabored ventilation, respiratory function stable and patient connected to nasal cannula oxygen Cardiovascular status: blood pressure returned to baseline and stable Postop Assessment: no signs of nausea or vomiting Anesthetic complications: no    Last Vitals:  Filed Vitals:   12/29/15 0725 12/29/15 0935  BP: 138/62   Pulse: 86 111  Temp: 36.3 C   Resp: 17     Last Pain:  Filed Vitals:   12/29/15 0935  PainSc: 2                  Tyla Burgner S

## 2015-12-29 NOTE — Evaluation (Signed)
Occupational Therapy Evaluation Patient Details Name: [name] MRN: [id number] DOB: 04/20/1941 Today's Date: 12/29/2015    History of Present Illness This patient is a 75 year old male who came to [facility] for a L TKR.   Clinical Impression   This patient is a 75 year old male who came to [facility] for a L total knee replacement.  Patient lives with spouse in a one story home.  He had been modified independent (uses walker) including driving.  He now has some deficits with mobility and activities of daily living and would benefit from Occupational Therapy for ADL/functional mobility training.      Follow Up Recommendations   (Home with home health Physical Therapy only, no further Occupational Therapy needed after discharge).    Equipment Recommendations       Recommendations for Other Services       Precautions / Restrictions Precautions Precautions: Fall;Other (comment);Knee (contact precautions.) Precaution Comments: Performed SLR x10 with L LE, no KI required. Restrictions Weight Bearing Restrictions: Yes LLE Weight Bearing: Weight bearing as tolerated      Mobility Bed Mobility              Transfers          Balance                            ADL                                         General ADL Comments: Hand been modified independent.  Today practiced techniques for lower body dressing. Patient unable to reach left foot so used hip kit to practice techniques for Donned/doffed socks and pants to knees (drain still in place). He needed minimal assist with set up and verbal cues for technique and safety.       Vision     Perception     Praxis      Pertinent Vitals/Pain Pain Assessment: No/denies pain     Hand Dominance     Extremity/Trunk Assessment Upper Extremity Assessment Upper Extremity Assessment: Overall WFL for tasks assessed   Lower Extremity Assessment Lower Extremity  Assessment: Defer to PT evaluation LLE Deficits / Details: Strength 4-/5       Communication Communication Communication: HOH   Cognition Arousal/Alertness: Awake/alert Behavior During Therapy: WFL for tasks assessed/performed Overall Cognitive Status: Within Functional Limits for tasks assessed                     General Comments       Exercises Exercises: Total Joint;Other exercises Other Exercises Other Exercises: L LE therex: SLR, ankle pumps, QS, LAQ x10 each. VC for technique. No c/o increased pain.   Shoulder Instructions      Home Living Family/patient expects to be discharged to:: Private residence Living Arrangements: Spouse/significant other Available Help at Discharge: Family Type of Home: House Home Access: Stairs to enter Technical brewer of Steps: 1 Entrance Stairs-Rails: None                 Home Equipment: Walker - 2 wheels          Prior Functioning/Environment Level of Independence: Independent with assistive device(s)        Comments: Independent, drives, uses a front wheeled walker.    OT Diagnosis: Acute  pain   OT Problem List: Decreased range of motion;Decreased activity tolerance;Impaired balance (sitting and/or standing);Decreased knowledge of use of DME or AE   OT Treatment/Interventions: Self-care/ADL training    OT Goals(Current goals can be found in the care plan section) Acute Rehab OT Goals Patient Stated Goal: to go home OT Goal Formulation: With patient Time For Goal Achievement: 01/12/16 Potential to Achieve Goals: Good  OT Frequency: Min 1X/week   Barriers to D/C:            Co-evaluation              End of Session Equipment Utilized During Treatment:  (hip kit)  Activity Tolerance:   Patient left: in bed;with call bell/phone within reach;with bed alarm set;with family/visitor present   Time: 7409-9278 OT Time Calculation (min): 18 min Charges:  OT General Charges $OT Visit: 1  Procedure OT Evaluation $OT Eval Low Complexity: 1 Procedure G-Codes:    Myrene Galas, MS/OTR/L  12/29/2015, 12:09 PM

## 2015-12-29 NOTE — Plan of Care (Signed)
Problem: Activity: Goal: Ability to avoid complications of mobility impairment will improve Outcome: Completed/Met Date Met:  12/29/15 Pt able to get up out of bed this evening. Tolerated well.  Problem: Pain Management: Goal: Pain level will decrease with appropriate interventions Outcome: Progressing Pt with minimal pain. Pain is controled with current pain regimen.  Problem: Skin Integrity: Goal: Signs of wound healing will improve Outcome: Progressing Surgical dressing remaining dry and intact.

## 2015-12-29 NOTE — Care Management Note (Addendum)
Case Management Note  Patient Details  Name: EZIAH NEGRO MRN: [id number] Date of Birth: 03-Feb-1941  Subjective/Objective:                   Met briefly with patient while he was working with PT who is recommending HHPT (patient walked to bathroom). RNCM to follow up with patient regarding home health agency. Per PT patient has a rolling walker and does not have DME needs as he has what he needs at home. He uses [facility] on [address] (not inside [employer]) [phone] for Rx. Action/Plan: Lovenox 64m #14 called in to [facility] for price/authorization if needed. List of home health agencies left at bedside for patient. RNCM will continue to follow.   Expected Discharge Date:                  Expected Discharge Plan:     In-House Referral:     Discharge planning Services  CM Consult  Post Acute Care Choice:  Home Health Choice offered to:  Patient  DME Arranged:    DME Agency:     HH Arranged:    [place]:     Status of Service:  In process, will continue to follow  Medicare Important Message Given:    Date Medicare IM Given:    Medicare IM give by:    Date Additional Medicare IM Given:    Additional Medicare Important Message give by:     If discussed at LKinderof Stay Meetings, dates discussed:    Additional Comments: Lovenox $ 95.91. His wife will be available to him in the home. [place]- referral made.    AMarshell Garfinkel RN 12/29/2015, 9:15 AM

## 2015-12-30 LAB — CBC
HCT: 33 % — ABNORMAL LOW (ref 40.0–52.0)
HEMOGLOBIN: 11.3 g/dL — AB (ref 13.0–18.0)
MCH: 31.1 pg (ref 26.0–34.0)
MCHC: 34.3 g/dL (ref 32.0–36.0)
MCV: 90.6 fL (ref 80.0–100.0)
Platelets: 148 10*3/uL — ABNORMAL LOW (ref 150–440)
RBC: 3.64 MIL/uL — AB (ref 4.40–5.90)
RDW: 14.7 % — AB (ref 11.5–14.5)
WBC: 10.9 10*3/uL — ABNORMAL HIGH (ref 3.8–10.6)

## 2015-12-30 LAB — GLUCOSE, CAPILLARY
GLUCOSE-CAPILLARY: 192 mg/dL — AB (ref 65–99)
GLUCOSE-CAPILLARY: 229 mg/dL — AB (ref 65–99)

## 2015-12-30 MED ORDER — TRAMADOL HCL 50 MG PO TABS: 50.0000 mg | ORAL_TABLET | ORAL | Status: DC | PRN

## 2015-12-30 MED ORDER — ENOXAPARIN SODIUM 40 MG/0.4ML ~~LOC~~ SOLN: 40.0000 mg | SUBCUTANEOUS | Status: DC

## 2015-12-30 MED ORDER — LACTULOSE 10 GM/15ML PO SOLN: 10.0000 g | Freq: Two times a day (BID) | ORAL | Status: DC | PRN

## 2015-12-30 MED ORDER — OXYCODONE HCL 5 MG PO TABS: 5.0000 mg | ORAL_TABLET | ORAL | Status: DC | PRN

## 2015-12-30 NOTE — Care Management Important Message (Signed)
Important Message  Patient Details  Name: [name] MRN: [id number] Date of Birth: October 30, 1941   Medicare Important Message Given:  Yes    Collie Siad, RN 12/30/2015, 9:22 AM

## 2015-12-30 NOTE — Progress Notes (Signed)
Physical Therapy Treatment Patient Details Name: [name] MRN: [id number] DOB: 1941/07/16 Today's Date: 12/30/2015    History of Present Illness 75 yo M presents after L TKR without complications. PMH includes HTN, DMII, angent orange exposure.    PT Comments    Pt c/o slightly increased pain today (2/10) compared to yesterday. During standing or ambulation pt remains steady and has no LOB. Pt ambulated 180 ft with FWW with mod I and requests to return to his room stating, " I don't want to go so far and make it start hurting a lot". AAROM L knee 5 to 95 degrees. Pt is scheduled to discharge home today with HHPT.   Follow Up Recommendations  Home health PT     Equipment Recommendations       Recommendations for Other Services       Precautions / Restrictions Precautions Precautions: Fall;Other (comment);Knee Precaution Comments: Performed SLR x10 with L LE, no KI required. Restrictions Weight Bearing Restrictions: Yes LLE Weight Bearing: Weight bearing as tolerated    Mobility  Bed Mobility                  Transfers Overall transfer level: Modified independent Equipment used: Rolling walker (2 wheeled) Transfers: Sit to/from [employer] Sit to Stand: Modified independent (Device/Increase time) Stand pivot transfers: Modified independent (Device/Increase time)       General transfer comment: demonstrated good safety awareness, steady, no LOB  Ambulation/Gait Ambulation/Gait assistance: Modified independent (Device/Increase time) Ambulation Distance (Feet): 180 Feet Assistive device: Rolling walker (2 wheeled) Gait Pattern/deviations: Decreased stride length;Antalgic;Narrow base of support     General Gait Details: VC for L knee extension when weight bearing, steady, no LOB   Stairs            Wheelchair Mobility    Modified Rankin (Stroke Patients Only)       Balance Overall balance assessment: Modified  Independent Sitting-balance support: Feet unsupported;No upper extremity supported Sitting balance-Leahy Scale: Good Sitting balance - Comments: maintains independently   Standing balance support: No upper extremity supported Standing balance-Leahy Scale: Good Standing balance comment: steady, no LOB                    Cognition Arousal/Alertness: Awake/alert Behavior During Therapy: WFL for tasks assessed/performed Overall Cognitive Status: Within Functional Limits for tasks assessed                      Exercises Total Joint Exercises Goniometric ROM: L knee AAROM 5 to 95 degrees Other Exercises Other Exercises: L LE seated therex: LAQ, heel slides, marching x10 each. VC for technique.    General Comments        Pertinent Vitals/Pain Pain Assessment: 0-10 Pain Score: 2  Pain Location: L knee Pain Descriptors / Indicators: Aching Pain Intervention(s): Limited activity within patient's tolerance;Premedicated before session    Home Living                      Prior Function            PT Goals (current goals can now be found in the care plan section) Acute Rehab PT Goals Patient Stated Goal: to go home PT Goal Formulation: With patient Time For Goal Achievement: 01/12/16 Potential to Achieve Goals: Good Progress towards PT goals: Progressing toward goals    Frequency  BID    PT Plan Current plan remains appropriate    Co-evaluation  End of Session Equipment Utilized During Treatment: Gait belt Activity Tolerance: Patient limited by pain Patient left: in chair;with call bell/phone within reach;with chair alarm set     Time: [phone] PT Time Calculation (min) (ACUTE ONLY): 25 min  Charges:  $Gait Training: 8-22 mins $Therapeutic Exercise: 8-22 mins                    G Codes:      Adelene Idler, PT, DPT  12/30/2015, 9:34 AM [phone]

## 2015-12-30 NOTE — Progress Notes (Signed)
   Subjective: 2 Days Post-Op Procedure(s) (LRB): COMPUTER ASSISTED TOTAL KNEE ARTHROPLASTY (Left) Patient reports pain as mild.   Patient is well, and has had no acute complaints or problems Continue PT today.  Plan is to go Home after hospital stay. no nausea and no vomiting Patient denies any chest pains or shortness of breath. Objective: Vital signs in last 24 hours: Temp:  [97.8 F (36.6 C)-98.4 F (36.9 C)] 97.8 F (36.6 C) (03/01 0311) Pulse Rate:  [39-111] 87 (03/01 0312) Resp:  [18] 18 (03/01 0311) BP: (121-133)/(54-64) 133/54 mmHg (03/01 0311) SpO2:  [93 %-96 %] 96 % (03/01 0312) Original dressing removed, Hemovac removed today, no drainage or signs of infection. Heels are non tender and elevated off the bed using rolled towels Intake/Output from previous day: 02/28 0701 - 03/01 0700 In: 3363.3 [P.O.:720; I.V.:2373.3] Out: 1350 [Urine:1100; Drains:250] Intake/Output this shift: Total I/O In: -  Out: 300 [Urine:300]   Recent Labs  12/29/15 0607 12/30/15 0553  HGB 11.7* 11.3*    Recent Labs  12/29/15 0607 12/30/15 0553  WBC 8.2 10.9*  RBC 3.73* 3.64*  HCT 33.8* 33.0*  PLT 146* 148*    Recent Labs  12/29/15 0607  NA 141  K 4.3  CL 110  CO2 25  BUN 18  CREATININE 0.92  GLUCOSE 299*  CALCIUM 8.2*   No results for input(s): LABPT, INR in the last 72 hours.  EXAM General - Patient is Alert, Appropriate and Oriented Extremity - Neurologically intact Neurovascular intact Sensation intact distally Intact pulses distally Dorsiflexion/Plantar flexion intact Dressing - dressing C/D/I Motor Function - intact, moving foot and toes well on exam.    Hemovac removed today.  New honeycomb dressing applied.  Past Medical History  Diagnosis Date  . Hypertension   . Encephalopathy acute 08/16/2013  . High cholesterol   . Hypothyroidism   . Type II diabetes mellitus (HCC)   . Agent orange exposure     "[name]"  . GERD (gastroesophageal reflux  disease)   . Arthritis     "left knee" (03/18/2014)  . Closed pelvic fracture (HCC) 03/18/2014    "multiple S/P fall w/loss of consciousness; CBG 29"    Assessment/Plan: 2 Days Post-Op Procedure(s) (LRB): COMPUTER ASSISTED TOTAL KNEE ARTHROPLASTY (Left) Active Problems:   S/P total knee arthroplasty  Estimated body mass index is 32.65 kg/(m^2) as calculated from the following:   Height as of this encounter:  (1.803 m).   Weight as of this encounter: 106.142 kg (234 lb). Advance diet Up with therapy Discharge home with home health  Labs: reviewed DVT Prophylaxis - Lovenox, Foot Pumps and TED hose Weight-Bearing as tolerated to left leg Pt has had a BM Continue Lovenox, Staples can be removed in two weeks, follow-up with Dr. Ernest Pine on 02/09/16.  Tony Batman, PA-C [facility] 12/30/2015, 8:42 AM

## 2015-12-30 NOTE — Discharge Summary (Signed)
Physician Discharge Summary  Patient ID: CHEN SAADEH MRN: [id number] DOB/AGE: 1941/05/22 75 y.o.  Admit date: 12/28/2015 Discharge date: 12/30/2015  Admission Diagnoses:  chronic left knee pain   Discharge Diagnoses: Patient Active Problem List   Diagnosis Date Noted  . S/P total knee arthroplasty 12/28/2015  . ARF (acute renal failure) (HCC) 03/21/2014  . CKD (chronic kidney disease) stage 3, GFR 30-59 ml/min 03/21/2014  . Syncope 03/19/2014  . Multiple closed fractures of pelvis (HCC) 03/18/2014  . Hypoglycemia 03/18/2014  . Pelvis fracture (HCC) 03/18/2014  . Acute encephalopathy 08/14/2013  . ANOMALY, POSTERIOR SOFT TISSUE IMPINGEMENT 10/10/2006  . OSTEOMYELITIS, HX OF 10/10/2006  . ONYCHOMYCOSIS 08/17/2006  . DIABETES MELLITUS, TYPE II 08/17/2006  . HYPERLIPIDEMIA 08/17/2006  . CATARACT NOS 08/17/2006  . HYPERTENSION 08/17/2006  . DIVERTICULOSIS, COLON 08/17/2006  . CELLULITIS, LEG, LEFT 08/17/2006  . OSTEOARTHRITIS 08/17/2006  . FRACTURE, ANKLE 08/17/2006  . COLONIC POLYPS, HX OF 08/17/2006    Past Medical History  Diagnosis Date  . Hypertension   . Encephalopathy acute 08/16/2013  . High cholesterol   . Hypothyroidism   . Type II diabetes mellitus (HCC)   . Agent orange exposure     "[name]"  . GERD (gastroesophageal reflux disease)   . Arthritis     "left knee" (03/18/2014)  . Closed pelvic fracture (HCC) 03/18/2014    "multiple S/P fall w/loss of consciousness; CBG 29"     Transfusion: Autovac transfusion given the first 6 hours postoperatively   Consultants (if any):   case management for home health assistance  Discharged Condition: Improved  Hospital Course: MANLY NESTLE is an 75 y.o. male who was admitted 12/28/2015 with a diagnosis of degenerative arthrosis left knee and went to the operating room on 12/28/2015 and underwent the above named procedures.    Surgeries:Procedure(s): COMPUTER ASSISTED TOTAL KNEE ARTHROPLASTY on  12/28/2015  PRE-OPERATIVE DIAGNOSIS: Degenerative arthrosis of the left knee, primary  POST-OPERATIVE DIAGNOSIS: Same  PROCEDURE: Left total knee arthroplasty using computer-assisted navigation  SURGEON: Jena Gauss. M.D.  ASSISTANT: Van Clines, PA (present and scrubbed throughout the case, critical for assistance with exposure, retraction, instrumentation, and closure)  ANESTHESIA: spinal  ESTIMATED BLOOD LOSS: 75 mL  FLUIDS REPLACED: 2300 mL of crystalloid  TOURNIQUET TIME: 104 minutes  DRAINS: 2 medium drains to a reinfusion system  SOFT TISSUE RELEASES: Anterior cruciate ligament, posterior cruciate ligament, deep and superficial medial collateral ligament, patellofemoral ligament   IMPLANTS UTILIZED: DePuy PFC Sigma size 4 posterior stabilized femoral component (cemented), size 5 MBT tibial component (cemented), 41 mm 3 peg oval dome patella (cemented), and a 10 mm stabilized rotating platform polyethylene insert.  INDICATIONS FOR SURGERY: CLAYBORN MILNES is a 75 y.o. year old male with a long history of progressive knee pain. X-rays demonstrated severe degenerative changes in tricompartmental fashion. The patient had not seen any significant improvement despite conservative nonsurgical intervention. After discussion of the risks and benefits of surgical intervention, the patient expressed understanding of the risks benefits and agree with plans for total knee arthroplasty.   The risks, benefits, and alternatives were discussed at length including but not limited to the risks of infection, bleeding, nerve injury, stiffness, blood clots, the need for revision surgery, cardiopulmonary complications, among others, and they were willing to proceed. Patient tolerated the surgery well. No complications .Patient was taken to PACU where she was stabilized and then transferred to the orthopedic floor.  Patient started on Lovenox 30 q 12 hrs. Foot  pumps applied bilaterally at 80 mm  hg. Heels elevated off bed with rolled towels. No evidence of DVT. Calves non tender. Negative Homan. Physical therapy started on day #1 for gait training and transfer with OT starting on  day #1 for ADL and assisted devices. Patient has done well with therapy. Ambulated greater than 200 feet upon being discharged. Was able to go up and down 4 steps independently and safely.  Patient's IV and Foley was discontinued on day #1 with the Hemovac being discontinued on day #2. Dressing was also changed prior to patient being discharged.   He was given perioperative antibiotics:  Anti-infectives    Start     Dose/Rate Route Frequency Ordered Stop   12/28/15 1830  clindamycin (CLEOCIN) IVPB 600 mg     600 mg 100 mL/hr over 30 Minutes Intravenous Every 6 hours 12/28/15 1728 12/29/15 1322   12/28/15 0945  vancomycin (VANCOCIN) IVPB 1000 mg/200 mL premix     1,000 mg 200 mL/hr over 60 Minutes Intravenous  Once 12/28/15 0939 12/28/15 1111   12/28/15 0945  clindamycin (CLEOCIN) IVPB 900 mg  Status:  Discontinued     900 mg 100 mL/hr over 30 Minutes Intravenous  Once 12/28/15 0939 12/28/15 1708   12/28/15 0842  clindamycin (CLEOCIN) 900 MG/50ML IVPB    Comments:  Hallaji, Violet Ann: cabinet override      12/28/15 0842 12/28/15 1152    .  He was fitted with AV 1 compression foot pump devices bilaterally, instructed on heel pumps, early ambulation, and fitted with TED stockings bilaterally for DVT prophylaxis.  He benefited maximally from the hospital stay and there were no complications.    Recent vital signs:  Filed Vitals:   12/30/15 0311 12/30/15 0312  BP: 133/54   Pulse: 39 87  Temp: 97.8 F (36.6 C)   Resp: 18     Recent laboratory studies:  Lab Results  Component Value Date   HGB 11.3* 12/30/2015   HGB 11.7* 12/29/2015   HGB 13.5 12/16/2015   Lab Results  Component Value Date   WBC 10.9* 12/30/2015   PLT 148* 12/30/2015   Lab Results  Component Value Date   INR 0.94  12/16/2015   Lab Results  Component Value Date   NA 141 12/29/2015   K 4.3 12/29/2015   CL 110 12/29/2015   CO2 25 12/29/2015   BUN 18 12/29/2015   CREATININE 0.92 12/29/2015   GLUCOSE 299* 12/29/2015    Discharge Medications:     Medication List    STOP taking these medications        Fish Oil 1200 MG Caps     HYDROcodone-acetaminophen 7.5-325 MG tablet  Commonly known as:  NORCO     ibuprofen 400 MG tablet  Commonly known as:  ADVIL,MOTRIN      TAKE these medications        aspirin EC 81 MG tablet  Take 81 mg by mouth daily.     atorvastatin 40 MG tablet  Commonly known as:  LIPITOR  Take 40 mg by mouth at bedtime.     benazepril 40 MG tablet  Commonly known as:  LOTENSIN  Take 40 mg by mouth daily.     cholecalciferol 1000 units tablet  Commonly known as:  VITAMIN D  Take 1,000 Units by mouth daily.     enoxaparin 40 MG/0.4ML injection  Commonly known as:  LOVENOX  Inject 0.4 mLs (40 mg total) into the skin daily.  glucosamine-chondroitin 500-400 MG tablet  Take 1 tablet by mouth 3 (three) times daily.     hydrochlorothiazide 25 MG tablet  Commonly known as:  HYDRODIURIL  Take 25 mg by mouth daily.     insulin glargine 100 UNIT/ML injection  Commonly known as:  LANTUS  Inject 58 Units into the skin daily.     insulin regular 100 units/mL injection  Commonly known as:  NOVOLIN R,HUMULIN R  Inject 14 Units into the skin 2 (two) times daily before a meal.     oxyCODONE 10 mg 12 hr tablet  Commonly known as:  OXYCONTIN  Take 1 tablet (10 mg total) by mouth every 12 (twelve) hours.     oxyCODONE 5 MG immediate release tablet  Commonly known as:  Oxy IR/ROXICODONE  Take 1-2 tablets (5-10 mg total) by mouth every 4 (four) hours as needed for severe pain or breakthrough pain.     pantoprazole 40 MG tablet  Commonly known as:  PROTONIX  Take 40 mg by mouth daily.     pyridOXINE 100 MG tablet  Commonly known as:  VITAMIN B-6  Take 100 mg by  mouth daily.     thyroid 120 MG tablet  Commonly known as:  ARMOUR  Take 120 mg by mouth daily before breakfast.     traMADol 50 MG tablet  Commonly known as:  ULTRAM  Take 1-2 tablets (50-100 mg total) by mouth every 4 (four) hours as needed for moderate pain.        Diagnostic Studies: Dg Knee Left Port  12/28/2015  CLINICAL DATA:  S/p TKR EXAM: PORTABLE LEFT KNEE - 1-2 VIEW COMPARISON:  None. FINDINGS: LEFT knee total arthroplasty. Surgical drain superior to the patella. Skin staples noted. IMPRESSION: No complication following LEFT knee arthroplasty Electronically Signed   By: Genevive Bi M.D.   On: 12/28/2015 16:08    Disposition: 01-Home or Self Care      Discharge Instructions    Diet - low sodium heart healthy    Complete by:  As directed      Increase activity slowly    Complete by:  As directed            Follow-up Information    Follow up with [facility] R[facility] PA On 01/12/2016.   Specialty:  Physician Assistant   Why:  at 9:15am   Contact information:   [address] [facility] [place] [address] [phone]       Follow up with Donato Heinz, MD On 02/09/2016.   Specialty:  Orthopedic Surgery   Why:  at 11:15am   Contact information:   [address] [facility] [place] [address] [phone]        Signed: Tera Partridge. 12/30/2015, 7:08 AM

## 2015-12-30 NOTE — Discharge Instructions (Signed)

## 2015-12-30 NOTE — Care Management (Signed)
Patient discharging home today followed by [facility]. I have notified Genevieve Norlander of patient discharge. No further RNCM needs. Case closed.

## 2015-12-30 NOTE — Progress Notes (Signed)
Discharge instructions and perscriptions given to pt , pt insulin returned, pt discharged via wheelchair care of family

## 2016-05-04 ENCOUNTER — Encounter
Admission: RE | Admit: 2016-05-04 | Discharge: 2016-05-04 | Disposition: A | Discharge status: Home / Self Care | Payer: Medicare Other | Source: Ambulatory Visit | Attending: Orthopedic Surgery | Admitting: Orthopedic Surgery

## 2016-05-04 DIAGNOSIS — Z9889 Other specified postprocedural states: Secondary | ICD-10-CM | POA: Diagnosis not present

## 2016-05-04 DIAGNOSIS — Z880 Allergy status to penicillin: Secondary | ICD-10-CM | POA: Diagnosis not present

## 2016-05-04 DIAGNOSIS — Z79891 Long term (current) use of opiate analgesic: Secondary | ICD-10-CM | POA: Diagnosis not present

## 2016-05-04 DIAGNOSIS — E119 Type 2 diabetes mellitus without complications: Secondary | ICD-10-CM | POA: Diagnosis not present

## 2016-05-04 DIAGNOSIS — E785 Hyperlipidemia, unspecified: Secondary | ICD-10-CM | POA: Diagnosis not present

## 2016-05-04 DIAGNOSIS — I10 Essential (primary) hypertension: Secondary | ICD-10-CM | POA: Diagnosis not present

## 2016-05-04 DIAGNOSIS — Z794 Long term (current) use of insulin: Secondary | ICD-10-CM | POA: Diagnosis not present

## 2016-05-04 DIAGNOSIS — Z87891 Personal history of nicotine dependence: Secondary | ICD-10-CM | POA: Diagnosis not present

## 2016-05-04 DIAGNOSIS — E669 Obesity, unspecified: Secondary | ICD-10-CM | POA: Diagnosis not present

## 2016-05-04 DIAGNOSIS — Z791 Long term (current) use of non-steroidal anti-inflammatories (NSAID): Secondary | ICD-10-CM | POA: Diagnosis not present

## 2016-05-04 DIAGNOSIS — G5602 Carpal tunnel syndrome, left upper limb: Secondary | ICD-10-CM | POA: Diagnosis present

## 2016-05-04 DIAGNOSIS — S52552A Other extraarticular fracture of lower end of left radius, initial encounter for closed fracture: Secondary | ICD-10-CM | POA: Diagnosis not present

## 2016-05-04 DIAGNOSIS — W010XXA Fall on same level from slipping, tripping and stumbling without subsequent striking against object, initial encounter: Secondary | ICD-10-CM | POA: Diagnosis not present

## 2016-05-04 DIAGNOSIS — Z86010 Personal history of colonic polyps: Secondary | ICD-10-CM | POA: Diagnosis not present

## 2016-05-04 DIAGNOSIS — Z79899 Other long term (current) drug therapy: Secondary | ICD-10-CM | POA: Diagnosis not present

## 2016-05-04 DIAGNOSIS — Z7984 Long term (current) use of oral hypoglycemic drugs: Secondary | ICD-10-CM | POA: Diagnosis not present

## 2016-05-04 DIAGNOSIS — Z888 Allergy status to other drugs, medicaments and biological substances status: Secondary | ICD-10-CM | POA: Diagnosis not present

## 2016-05-04 DIAGNOSIS — E079 Disorder of thyroid, unspecified: Secondary | ICD-10-CM | POA: Diagnosis not present

## 2016-05-04 DIAGNOSIS — Z7982 Long term (current) use of aspirin: Secondary | ICD-10-CM | POA: Diagnosis not present

## 2016-05-04 DIAGNOSIS — M1990 Unspecified osteoarthritis, unspecified site: Secondary | ICD-10-CM | POA: Diagnosis not present

## 2016-05-04 DIAGNOSIS — Z6832 Body mass index (BMI) 32.0-32.9, adult: Secondary | ICD-10-CM | POA: Diagnosis not present

## 2016-05-04 DIAGNOSIS — K219 Gastro-esophageal reflux disease without esophagitis: Secondary | ICD-10-CM | POA: Diagnosis not present

## 2016-05-04 LAB — SURGICAL PCR SCREEN
MRSA, PCR: NEGATIVE
Staphylococcus aureus: POSITIVE — AB

## 2016-05-04 LAB — BASIC METABOLIC PANEL
ANION GAP: 8 (ref 5–15)
BUN: 14 mg/dL (ref 6–20)
CO2: 28 mmol/L (ref 22–32)
Calcium: 9.3 mg/dL (ref 8.9–10.3)
Chloride: 105 mmol/L (ref 101–111)
Creatinine, Ser: 0.99 mg/dL (ref 0.61–1.24)
GFR calc Af Amer: >60 mL/min (ref >60)
GFR calc non Af Amer: >60 mL/min (ref >60)
GLUCOSE: 56 mg/dL — AB (ref 65–99)
POTASSIUM: 3.6 mmol/L (ref 3.5–5.1)
Sodium: 141 mmol/L (ref 135–145)

## 2016-05-04 NOTE — Patient Instructions (Signed)
Your procedure is scheduled on: 05/05/16 TOMORROW Report to Day Surgery. 2ND FLOOR MEDICAL MALL ENTRANCE To find out your arrival time please call [phone] between 1PM - 3PM on TODAY.  Remember: Instructions that are not followed completely may result in serious medical risk, up to and including death, or upon the discretion of your surgeon and anesthesiologist your surgery may need to be rescheduled.    __X__ 1. Do not eat food or drink liquids after midnight. No gum chewing or hard candies.     __X__ 2. No Alcohol for 24 hours before or after surgery.   ____ 3. Bring all medications with you on the day of surgery if instructed.    __X__ 4. Notify your doctor if there is any change in your medical condition     (cold, fever, infections).     Do not wear jewelry, make-up, hairpins, clips or nail polish.  Do not wear lotions, powders, or perfumes.   Do not shave 48 hours prior to surgery. Men may shave face and neck.  Do not bring valuables to the hospital.    [facility] is not responsible for any belongings or valuables.               Contacts, dentures or bridgework may not be worn into surgery.  Leave your suitcase in the car. After surgery it may be brought to your room.  For patients admitted to the hospital, discharge time is determined by your                treatment team.   Patients discharged the day of surgery will not be allowed to drive home.   Please read over the following fact sheets that you were given:   MRSA Information and Surgical Site Infection Prevention   __X__ Take these medicines the morning of surgery with A SIP OF WATER:    1. BENAZEPRIL  2. PANTOPRAZOLE  3.   4.  5.  6.  ____ Fleet Enema (as directed)   __X__ Use CHG Soap as directed  ____ Use inhalers on the day of surgery  ____ Stop metformin 2 days prior to surgery    __X__ Take 1/2 of usual insulin dose the night before surgery and none on the morning of surgery.   __X__ Stop  Coumadin/Plavix/aspirin on TODAY  ____ Stop Anti-inflammatories on    __X__ Stop supplements until after surgery.    ____ Bring C-Pap to the hospital.

## 2016-05-04 NOTE — Pre-Procedure Instructions (Signed)
LM and [facility] Rosita Kea[facility]enz office

## 2016-05-05 ENCOUNTER — Encounter
Admission: RE | Disposition: A | Discharge status: Home / Self Care | Payer: Self-pay | Source: Ambulatory Visit | Attending: Orthopedic Surgery

## 2016-05-05 ENCOUNTER — Ambulatory Visit: Payer: Medicare Other | Admitting: Anesthesiology

## 2016-05-05 ENCOUNTER — Ambulatory Visit: Payer: Medicare Other

## 2016-05-05 ENCOUNTER — Ambulatory Visit
Admission: RE | Admit: 2016-05-05 | Discharge: 2016-05-05 | Disposition: A | Discharge status: Home / Self Care | Payer: Medicare Other | Source: Ambulatory Visit | Attending: Orthopedic Surgery | Admitting: Orthopedic Surgery

## 2016-05-05 ENCOUNTER — Encounter: Payer: Self-pay | Admitting: *Deleted

## 2016-05-05 DIAGNOSIS — Z888 Allergy status to other drugs, medicaments and biological substances status: Secondary | ICD-10-CM | POA: Insufficient documentation

## 2016-05-05 DIAGNOSIS — S52552A Other extraarticular fracture of lower end of left radius, initial encounter for closed fracture: Secondary | ICD-10-CM | POA: Diagnosis not present

## 2016-05-05 DIAGNOSIS — Z87891 Personal history of nicotine dependence: Secondary | ICD-10-CM | POA: Insufficient documentation

## 2016-05-05 DIAGNOSIS — Z880 Allergy status to penicillin: Secondary | ICD-10-CM | POA: Insufficient documentation

## 2016-05-05 DIAGNOSIS — Z7982 Long term (current) use of aspirin: Secondary | ICD-10-CM | POA: Insufficient documentation

## 2016-05-05 DIAGNOSIS — Z79899 Other long term (current) drug therapy: Secondary | ICD-10-CM | POA: Insufficient documentation

## 2016-05-05 DIAGNOSIS — Z7984 Long term (current) use of oral hypoglycemic drugs: Secondary | ICD-10-CM | POA: Insufficient documentation

## 2016-05-05 DIAGNOSIS — Z86010 Personal history of colonic polyps: Secondary | ICD-10-CM | POA: Insufficient documentation

## 2016-05-05 DIAGNOSIS — Z9889 Other specified postprocedural states: Secondary | ICD-10-CM | POA: Insufficient documentation

## 2016-05-05 DIAGNOSIS — G5602 Carpal tunnel syndrome, left upper limb: Secondary | ICD-10-CM | POA: Insufficient documentation

## 2016-05-05 DIAGNOSIS — M1990 Unspecified osteoarthritis, unspecified site: Secondary | ICD-10-CM | POA: Insufficient documentation

## 2016-05-05 DIAGNOSIS — W010XXA Fall on same level from slipping, tripping and stumbling without subsequent striking against object, initial encounter: Secondary | ICD-10-CM | POA: Insufficient documentation

## 2016-05-05 DIAGNOSIS — Z79891 Long term (current) use of opiate analgesic: Secondary | ICD-10-CM | POA: Insufficient documentation

## 2016-05-05 DIAGNOSIS — E119 Type 2 diabetes mellitus without complications: Secondary | ICD-10-CM | POA: Insufficient documentation

## 2016-05-05 DIAGNOSIS — E785 Hyperlipidemia, unspecified: Secondary | ICD-10-CM | POA: Insufficient documentation

## 2016-05-05 DIAGNOSIS — E079 Disorder of thyroid, unspecified: Secondary | ICD-10-CM | POA: Insufficient documentation

## 2016-05-05 DIAGNOSIS — K219 Gastro-esophageal reflux disease without esophagitis: Secondary | ICD-10-CM | POA: Insufficient documentation

## 2016-05-05 DIAGNOSIS — Z6832 Body mass index (BMI) 32.0-32.9, adult: Secondary | ICD-10-CM | POA: Insufficient documentation

## 2016-05-05 DIAGNOSIS — Z791 Long term (current) use of non-steroidal anti-inflammatories (NSAID): Secondary | ICD-10-CM | POA: Insufficient documentation

## 2016-05-05 DIAGNOSIS — E669 Obesity, unspecified: Secondary | ICD-10-CM | POA: Insufficient documentation

## 2016-05-05 DIAGNOSIS — I10 Essential (primary) hypertension: Secondary | ICD-10-CM | POA: Insufficient documentation

## 2016-05-05 DIAGNOSIS — Z8781 Personal history of (healed) traumatic fracture: Secondary | ICD-10-CM

## 2016-05-05 DIAGNOSIS — Z794 Long term (current) use of insulin: Secondary | ICD-10-CM | POA: Insufficient documentation

## 2016-05-05 HISTORY — PX: ORIF WRIST FRACTURE: SHX2133

## 2016-05-05 HISTORY — PX: CARPAL TUNNEL RELEASE: SHX101

## 2016-05-05 LAB — GLUCOSE, CAPILLARY
Glucose-Capillary: 317 mg/dL — ABNORMAL HIGH (ref 65–99)
Glucose-Capillary: 315 mg/dL — ABNORMAL HIGH (ref 65–99)
Glucose-Capillary: 246 mg/dL — ABNORMAL HIGH (ref 65–99)

## 2016-05-05 SURGERY — OPEN REDUCTION INTERNAL FIXATION (ORIF) WRIST FRACTURE
Anesthesia: General | Laterality: Left | Wound class: Clean

## 2016-05-05 MED ORDER — INSULIN ASPART 100 UNIT/ML ~~LOC~~ SOLN
SUBCUTANEOUS | Status: AC
  Administered 2016-05-05: 8 [IU] via SUBCUTANEOUS
  Filled 2016-05-05: qty 8

## 2016-05-05 MED ORDER — BUPIVACAINE HCL 0.5 % IJ SOLN
INTRAMUSCULAR | Status: DC | PRN
  Administered 2016-05-05: 20 mL

## 2016-05-05 MED ORDER — LIDOCAINE HCL (PF) 4 % IJ SOLN
INTRAMUSCULAR | Status: DC | PRN
  Administered 2016-05-05: 4 mL via RESPIRATORY_TRACT

## 2016-05-05 MED ORDER — CLINDAMYCIN PHOSPHATE 900 MG/50ML IV SOLN: 900.0000 mg | Freq: Once | INTRAVENOUS | Status: DC

## 2016-05-05 MED ORDER — GLYCOPYRROLATE 0.2 MG/ML IJ SOLN
INTRAMUSCULAR | Status: DC | PRN
  Administered 2016-05-05: 0.4 mg via INTRAVENOUS

## 2016-05-05 MED ORDER — METOCLOPRAMIDE HCL 5 MG/ML IJ SOLN: 5.0000 mg | Freq: Three times a day (TID) | INTRAMUSCULAR | Status: DC | PRN

## 2016-05-05 MED ORDER — HYDROCODONE-ACETAMINOPHEN 5-325 MG PO TABS: 1.0000 | ORAL_TABLET | ORAL | Status: DC | PRN

## 2016-05-05 MED ORDER — NEOMYCIN-POLYMYXIN B GU 40-200000 IR SOLN
Status: DC | PRN
  Administered 2016-05-05: 2 mL

## 2016-05-05 MED ORDER — SODIUM CHLORIDE 0.9 % IV SOLN: INTRAVENOUS | Status: DC

## 2016-05-05 MED ORDER — LIDOCAINE HCL (CARDIAC) 20 MG/ML IV SOLN
INTRAVENOUS | Status: DC | PRN
  Administered 2016-05-05: 40 mg via INTRAVENOUS

## 2016-05-05 MED ORDER — INSULIN ASPART 100 UNIT/ML ~~LOC~~ SOLN
8.0000 [IU] | Freq: Once | SUBCUTANEOUS | Status: AC
  Administered 2016-05-05: 8 [IU] via SUBCUTANEOUS

## 2016-05-05 MED ORDER — ACETAMINOPHEN 10 MG/ML IV SOLN
INTRAVENOUS | Status: AC
  Filled 2016-05-05: qty 100

## 2016-05-05 MED ORDER — HYDROCODONE-ACETAMINOPHEN 5-325 MG PO TABS: 1.0000 | ORAL_TABLET | Freq: Four times a day (QID) | ORAL | Status: DC | PRN

## 2016-05-05 MED ORDER — CLINDAMYCIN PHOSPHATE 900 MG/50ML IV SOLN
INTRAVENOUS | Status: AC
  Administered 2016-05-05: 900 mg via INTRAVENOUS
  Filled 2016-05-05: qty 50

## 2016-05-05 MED ORDER — ACETAMINOPHEN 10 MG/ML IV SOLN
INTRAVENOUS | Status: DC | PRN
  Administered 2016-05-05: 1000 mg via INTRAVENOUS

## 2016-05-05 MED ORDER — LABETALOL HCL 5 MG/ML IV SOLN
INTRAVENOUS | Status: DC | PRN
  Administered 2016-05-05: 10 mg via INTRAVENOUS

## 2016-05-05 MED ORDER — ONDANSETRON HCL 4 MG/2ML IJ SOLN
INTRAMUSCULAR | Status: DC | PRN
  Administered 2016-05-05: 4 mg via INTRAVENOUS

## 2016-05-05 MED ORDER — SODIUM CHLORIDE 0.9 % IV SOLN
INTRAVENOUS | Status: DC
  Administered 2016-05-05: 13:00:00 via INTRAVENOUS

## 2016-05-05 MED ORDER — FENTANYL CITRATE (PF) 100 MCG/2ML IJ SOLN
INTRAMUSCULAR | Status: DC | PRN
Start: 2016-05-05 — End: 2016-05-05
  Administered 2016-05-05: 150 ug via INTRAVENOUS
  Administered 2016-05-05: 100 ug via INTRAVENOUS

## 2016-05-05 MED ORDER — NEOSTIGMINE METHYLSULFATE 10 MG/10ML IV SOLN
INTRAVENOUS | Status: DC | PRN
  Administered 2016-05-05: 3 mg via INTRAVENOUS

## 2016-05-05 MED ORDER — ROCURONIUM BROMIDE 100 MG/10ML IV SOLN
INTRAVENOUS | Status: DC | PRN
  Administered 2016-05-05: 50 mg via INTRAVENOUS

## 2016-05-05 MED ORDER — METOPROLOL TARTRATE 5 MG/5ML IV SOLN
INTRAVENOUS | Status: DC | PRN
  Administered 2016-05-05: 3 mg via INTRAVENOUS

## 2016-05-05 MED ORDER — ONDANSETRON HCL 4 MG PO TABS: 4.0000 mg | ORAL_TABLET | Freq: Four times a day (QID) | ORAL | Status: DC | PRN

## 2016-05-05 MED ORDER — METOCLOPRAMIDE HCL 10 MG PO TABS: 5.0000 mg | ORAL_TABLET | Freq: Three times a day (TID) | ORAL | Status: DC | PRN

## 2016-05-05 MED ORDER — BUPIVACAINE HCL (PF) 0.5 % IJ SOLN
INTRAMUSCULAR | Status: AC
  Filled 2016-05-05: qty 30

## 2016-05-05 MED ORDER — REMIFENTANIL HCL 1 MG IV SOLR
INTRAVENOUS | Status: DC | PRN
  Administered 2016-05-05: .2 ug/kg/min via INTRAVENOUS

## 2016-05-05 MED ORDER — PROPOFOL 10 MG/ML IV BOLUS
INTRAVENOUS | Status: DC | PRN
  Administered 2016-05-05: 130 mg via INTRAVENOUS

## 2016-05-05 MED ORDER — NEOMYCIN-POLYMYXIN B GU 40-200000 IR SOLN
Status: AC
  Filled 2016-05-05: qty 2

## 2016-05-05 MED ORDER — ONDANSETRON HCL 4 MG/2ML IJ SOLN: 4.0000 mg | Freq: Four times a day (QID) | INTRAMUSCULAR | Status: DC | PRN

## 2016-05-05 MED ORDER — FENTANYL CITRATE (PF) 100 MCG/2ML IJ SOLN: 25.0000 ug | INTRAMUSCULAR | Status: DC | PRN

## 2016-05-05 MED ORDER — ONDANSETRON HCL 4 MG/2ML IJ SOLN: 4.0000 mg | Freq: Once | INTRAMUSCULAR | Status: DC | PRN

## 2016-05-05 SURGICAL SUPPLY — 58 items
BANDAGE ACE 3X5.8 VEL STRL LF (GAUZE/BANDAGES/DRESSINGS) ×2 IMPLANT
BANDAGE ELASTIC 4 LF NS (GAUZE/BANDAGES/DRESSINGS) ×2 IMPLANT
BIT DRILL 2 FAST STEP (BIT) ×2 IMPLANT
BNDG ESMARK 4X12 TAN STRL LF (GAUZE/BANDAGES/DRESSINGS) ×2 IMPLANT
CANISTER SUCT 1200ML W/VALVE (MISCELLANEOUS) ×2 IMPLANT
CAST PADDING 3X4FT ST 30246 (SOFTGOODS) ×1
CHLORAPREP W/TINT 26ML (MISCELLANEOUS) ×2 IMPLANT
CORD BIP STRL DISP 12FT (MISCELLANEOUS) ×2 IMPLANT
CUFF TOURN 18 STER (MISCELLANEOUS) ×2 IMPLANT
DRAPE FLUOR MINI C-ARM 54X84 (DRAPES) ×2 IMPLANT
DRILL GUIDE DBL 2.5/3.5MM (INSTRUMENTS) ×2 IMPLANT
DRSG EMULSION OIL 3X8 NADH (GAUZE/BANDAGES/DRESSINGS) ×2 IMPLANT
DURAPREP 6ML APPLICATOR 50/CS (WOUND CARE) IMPLANT
ELECT CAUTERY BLADE 6.4 (BLADE) ×2 IMPLANT
ELECT CAUTERY NEEDLE 2.0 MIC (NEEDLE) IMPLANT
ELECT CAUTERY NEEDLE TIP 1.0 (MISCELLANEOUS)
ELECT REM PT RETURN 9FT ADLT (ELECTROSURGICAL) ×2
ELECTRODE CAUTERY NEDL TIP 1.0 (MISCELLANEOUS) IMPLANT
ELECTRODE REM PT RTRN 9FT ADLT (ELECTROSURGICAL) ×1 IMPLANT
FORCEPS JEWEL BIP 4-3/4 STR (INSTRUMENTS) ×2 IMPLANT
GAUZE PETRO XEROFOAM 1X8 (MISCELLANEOUS) ×2 IMPLANT
GAUZE SPONGE 4X4 12PLY STRL (GAUZE/BANDAGES/DRESSINGS) ×2 IMPLANT
GLOVE BIOGEL PI IND STRL 9 (GLOVE) ×1 IMPLANT
GLOVE BIOGEL PI INDICATOR 9 (GLOVE) ×1
GLOVE SURG ORTHO 9.0 STRL STRW (GLOVE) ×2 IMPLANT
GOWN STRL REUS W/ TWL LRG LVL3 (GOWN DISPOSABLE) ×1 IMPLANT
GOWN STRL REUS W/TWL 2XL LVL3 (GOWN DISPOSABLE) ×2 IMPLANT
GOWN STRL REUS W/TWL LRG LVL3 (GOWN DISPOSABLE) ×1
GOWN SURG XXL (GOWNS) ×2 IMPLANT
K-WIRE 1.6 (WIRE) ×1
K-WIRE FX5X1.6XNS BN SS (WIRE) ×1
KIT RM TURNOVER STRD PROC AR (KITS) ×2 IMPLANT
KWIRE FX5X1.6XNS BN SS (WIRE) ×1 IMPLANT
NEEDLE HYPO 25X1 1.5 SAFETY (NEEDLE) ×2 IMPLANT
NS IRRIG 500ML POUR BTL (IV SOLUTION) ×2 IMPLANT
PACK EXTREMITY ARMC (MISCELLANEOUS) ×2 IMPLANT
PAD CAST CTTN 3X4 STRL (SOFTGOODS) ×1 IMPLANT
PAD CAST CTTN 4X4 STRL (SOFTGOODS) ×1 IMPLANT
PADDING CAST COTTON 4X4 STRL (SOFTGOODS) ×1
PEG SUBCHONDRAL SMOOTH 2.0X18 (Peg) ×2 IMPLANT
PEG SUBCHONDRAL SMOOTH 2.0X22 (Peg) ×4 IMPLANT
PEG SUBCHONDRAL SMOOTH 2.0X24 (Peg) ×2 IMPLANT
PEG SUBCHONDRAL SMOOTH 2.0X26 (Peg) ×4 IMPLANT
PEG SUBCHONDRAL SMOOTH 2.0X28 (Peg) ×2 IMPLANT
PLATE SHORT 24.4X51.3 LT (Plate) ×2 IMPLANT
SCREW BN 12X3.5XNS CORT TI (Screw) ×1 IMPLANT
SCREW CORT 3.5X12 (Screw) ×1 IMPLANT
SCREW CORT 3.5X14 LNG (Screw) ×4 IMPLANT
SPLINT CAST 1 STEP 3X12 (MISCELLANEOUS) ×2 IMPLANT
STOCKINETTE STRL 3IN 960336 (SOFTGOODS) ×2 IMPLANT
STOCKINETTE STRL 4IN 9604848 (GAUZE/BANDAGES/DRESSINGS) ×2 IMPLANT
SUT ETHILON 4 0 P 3 18 (SUTURE) ×2 IMPLANT
SUT ETHILON 4-0 (SUTURE) ×2
SUT ETHILON 4-0 FS2 18XMFL BLK (SUTURE) ×2
SUT VIC AB 3-0 SH 27 (SUTURE) ×1
SUT VIC AB 3-0 SH 27X BRD (SUTURE) ×1 IMPLANT
SUTURE ETHLN 4-0 FS2 18XMF BLK (SUTURE) ×2 IMPLANT
SYRINGE 10CC LL (SYRINGE) ×2 IMPLANT

## 2016-05-05 NOTE — Discharge Instructions (Addendum)
Keep arm elevated as much as possible. Work fingers is much as he can especially bending at the knuckles. Keep splint on, can loosen Ace wrap if it feels too tight. Pain medicine as directed.    AMBULATORY SURGERY  DISCHARGE INSTRUCTIONS   1) The drugs that you were given will stay in your system until tomorrow so for the next 24 hours you should not:  A) Drive an automobile B) Make any legal decisions C) Drink any alcoholic beverage   2) You may resume regular meals tomorrow.  Today it is better to start with liquids and gradually work up to solid foods.  You may eat anything you prefer, but it is better to start with liquids, then soup and crackers, and gradually work up to solid foods.   3) Please notify your doctor immediately if you have any unusual bleeding, trouble breathing, redness and pain at the surgery site, drainage, fever, or pain not relieved by medication.    4) Additional Instructions:        Please contact your physician with any problems or Same Day Surgery at [phone], Monday through Friday 6 am to 4 pm, or [place] at [facility] Main number at [phone].

## 2016-05-05 NOTE — H&P (Signed)
Reviewed paper H+P, will be scanned into chart. No changes noted.  

## 2016-05-05 NOTE — Transfer of Care (Signed)
Immediate Anesthesia Transfer of Care Note  Patient: [name]  Procedure(s) Performed: Procedure(s): OPEN REDUCTION INTERNAL FIXATION (ORIF) WRIST FRACTURE (Left) CARPAL TUNNEL RELEASE (Left)  Patient Location: PACU  Anesthesia Type:General  Level of Consciousness: awake, alert , oriented and patient cooperative  Airway & Oxygen Therapy: Patient Spontanous Breathing  Post-op Assessment: Report given to RN and Post -op Vital signs reviewed and stable  Post vital signs: Reviewed and stable  Last Vitals:  Filed Vitals:   05/05/16 1214  BP: 144/80  Pulse: 106  Temp: 37.2 C  Resp: 18    Last Pain:  Filed Vitals:   05/05/16 1228  PainSc: 8          Complications: No apparent anesthesia complications

## 2016-05-05 NOTE — Op Note (Signed)
05/05/2016  2:30 PM  PATIENT:  [name]  75 y.o. male  PRE-OPERATIVE DIAGNOSIS:  DISPLACED FRACTURE DISTAL END OF LEFT RADIUS and acute carpal tunnel  POST-OPERATIVE DIAGNOSIS:  DISPLACED FRACTURE DISTAL END OF LEFT RADIUS and acute carpal tunnel  PROCEDURE:  Procedure(s): OPEN REDUCTION INTERNAL FIXATION (ORIF) WRIST FRACTURE (Left) CARPAL TUNNEL RELEASE (Left)  SURGEON: Leitha SchullerMichael J Arjuna Doeden, MD  ASSISTANTS: None  ANESTHESIA:   general  EBL:  Total I/O In: 650 [I.V.:650] Out: 3 [Blood:3]  BLOOD ADMINISTERED:none  DRAINS: none   LOCAL MEDICATIONS USED:  MARCAINE     SPECIMEN:  No Specimen  DISPOSITION OF SPECIMEN:  N/A  COUNTS:  YES  TOURNIQUET:    IMPLANTS: 34 minutes at 250 mmHg  DICTATION: .Dragon Dictation patient was brought the operating room and after adequate anesthesia was obtained the left arm was prepped and draped in sterile fashion was turned by the upper arm. After patient identification and timeout procedures were completed the tourniquet was raised and the carpal tunnel releases performed with an incision in line with the ring metacarpal approximate 3 cm in length going down through the skin and subcutaneous tissues tissue transverse carpal ligament was identified and incised with a vascular hemostat protecting underlying structures, the transverse carpal ligament was released proximally and distally until there is clear that there is no compression. Next attention was turned to the distal radius with a volar approach centered over the FCR tendon the tendon sheath was incised and the tendon retracted radially the deep fascia incised and the pronator then elevated off the distal radius with separated being partially stripped by the injury. Fingertrap traction was applied and the subcutaneous and indirect reduction was near anatomic after exposing the volar surface of the distal radius and a standard width short DVR plate was applied in the appropriate location  distally with the shaft slightly off from the plate to allow for correction of the volar tilt. The distal Pagels were filled with standard smooth pegs with a K wire first used to get appropriate positioning after THE peg holes were filled the plate was brought down to the shaft with appropriate length radial inclination and restoration of some volar tilt the 3 shaft screws were placed and traction released under fluoroscopic views the fracture appeared stable and there is no penetration into the joint. At this point both wounds were thoroughly irrigated and a half percent Sensorcaine was infiltrated into both incisions approximately 10 cc in each. The carpal tunnel incision was closed with simple interrupted 5-0 nylon, and then tourniquet was let down hemostasis checked in the volar incision and there is no significant bleeding. The wound was closed with 3-0 Vicryl subcutaneously and 4-0 nylon for the skin. Xeroform 4 x 4 web roll and a volar splint was applied followed by an Ace wrap  PLAN OF CARE: Discharge to home after PACU  PATIENT DISPOSITION:  PACU - hemodynamically stable.

## 2016-05-05 NOTE — Anesthesia Preprocedure Evaluation (Addendum)
Anesthesia Evaluation  Patient identified by MRN, date of birth, ID band Patient awake    Reviewed: Allergy & Precautions, NPO status , Patient's Chart, lab work & pertinent test results, reviewed documented beta blocker date and time   Airway Mallampati: III  TM Distance: >3 FB     Dental  (+) Chipped, Poor Dentition   Pulmonary former smoker,    Pulmonary exam normal        Cardiovascular hypertension, Pt. on medications      Neuro/Psych PSYCHIATRIC DISORDERS Anxiety Hx of encephalopathy 2014 negative neurological ROS     GI/Hepatic GERD  Medicated and Controlled,Colon polyp hx   Endo/Other  diabetes, Well Controlled, Type 2, Oral Hypoglycemic AgentsHypothyroidism   Renal/GU Renal InsufficiencyRenal disease  negative genitourinary   Musculoskeletal  (+) Arthritis , Osteoarthritis,    Abdominal Normal abdominal exam  (+)   Peds negative pediatric ROS (+)  Hematology negative hematology ROS (+)   Anesthesia Other Findings Obese. Syncope. EKG shows PVCs . He states he has had funny beats in the past. The syncope has been fro too much insulin.  Reproductive/Obstetrics                           Anesthesia Physical  Anesthesia Plan  ASA: III  Anesthesia Plan: General   Post-op Pain Management:    Induction:   Airway Management Planned: Oral ETT and LMA  Additional Equipment:   Intra-op Plan:   Post-operative Plan: Extubation in OR  Informed Consent: I have reviewed the patients History and Physical, chart, labs and discussed the procedure including the risks, benefits and alternatives for the proposed anesthesia with the patient or authorized representative who has indicated his/her understanding and acceptance.   Dental advisory given  Plan Discussed with: CRNA and Surgeon  Anesthesia Plan Comments:        Anesthesia Quick Evaluation

## 2016-05-05 NOTE — Anesthesia Procedure Notes (Signed)
Procedure Name: Intubation Date/Time: 05/05/2016 1:14 PM Performed by: Shirlee LimerickMARION, Donnivan Villena Pre-anesthesia Checklist: Patient identified, Emergency Drugs available, Suction available and Patient being monitored Patient Re-evaluated:Patient Re-evaluated prior to inductionOxygen Delivery Method: Circle system utilized Preoxygenation: Pre-oxygenation with 100% oxygen Intubation Type: IV induction Laryngoscope Size: Mac and 3 Grade View: Grade I Tube size: 7.0 mm Number of attempts: 1 Placement Confirmation: ETT inserted through vocal cords under direct vision,  positive ETCO2 and breath sounds checked- equal and bilateral Secured at: 23 cm Tube secured with: Tape Dental Injury: Teeth and Oropharynx as per pre-operative assessment

## 2016-05-07 NOTE — Anesthesia Postprocedure Evaluation (Signed)
Anesthesia Post Note  Patient: [name]  Procedure(s) Performed: Procedure(s) (LRB): OPEN REDUCTION INTERNAL FIXATION (ORIF) WRIST FRACTURE (Left) CARPAL TUNNEL RELEASE (Left)  Patient location during evaluation: PACU Anesthesia Type: General Level of consciousness: awake and alert and oriented Pain management: pain level controlled Vital Signs Assessment: post-procedure vital signs reviewed and stable Respiratory status: spontaneous breathing Cardiovascular status: blood pressure returned to baseline Anesthetic complications: no    Last Vitals:  Filed Vitals:   05/05/16 1525 05/05/16 1551  BP: 123/71 138/61  Pulse: 62 81  Temp: 36.6 C   Resp: 16 16    Last Pain:  Filed Vitals:   05/06/16 0949  PainSc: 2                  Deasiah Hagberg

## 2016-11-15 DIAGNOSIS — I10 Essential (primary) hypertension: Secondary | ICD-10-CM | POA: Diagnosis not present

## 2016-11-15 DIAGNOSIS — E1165 Type 2 diabetes mellitus with hyperglycemia: Secondary | ICD-10-CM | POA: Diagnosis not present

## 2016-11-15 DIAGNOSIS — E063 Autoimmune thyroiditis: Secondary | ICD-10-CM | POA: Diagnosis not present

## 2016-11-15 DIAGNOSIS — E890 Postprocedural hypothyroidism: Secondary | ICD-10-CM | POA: Diagnosis not present

## 2016-11-15 DIAGNOSIS — E785 Hyperlipidemia, unspecified: Secondary | ICD-10-CM | POA: Diagnosis not present

## 2016-11-15 DIAGNOSIS — E0520 Thyrotoxicosis with toxic multinodular goiter without thyrotoxic crisis or storm: Secondary | ICD-10-CM | POA: Diagnosis not present

## 2016-11-22 DIAGNOSIS — E890 Postprocedural hypothyroidism: Secondary | ICD-10-CM | POA: Diagnosis not present

## 2016-11-22 DIAGNOSIS — E1165 Type 2 diabetes mellitus with hyperglycemia: Secondary | ICD-10-CM | POA: Diagnosis not present

## 2016-11-22 DIAGNOSIS — I10 Essential (primary) hypertension: Secondary | ICD-10-CM | POA: Diagnosis not present

## 2016-11-22 DIAGNOSIS — E785 Hyperlipidemia, unspecified: Secondary | ICD-10-CM | POA: Diagnosis not present

## 2016-11-22 DIAGNOSIS — E0520 Thyrotoxicosis with toxic multinodular goiter without thyrotoxic crisis or storm: Secondary | ICD-10-CM | POA: Diagnosis not present

## 2016-11-22 DIAGNOSIS — E063 Autoimmune thyroiditis: Secondary | ICD-10-CM | POA: Diagnosis not present

## 2016-11-23 DIAGNOSIS — R69 Illness, unspecified: Secondary | ICD-10-CM | POA: Diagnosis not present

## 2016-11-29 DIAGNOSIS — I10 Essential (primary) hypertension: Secondary | ICD-10-CM | POA: Diagnosis not present

## 2016-11-29 DIAGNOSIS — E6609 Other obesity due to excess calories: Secondary | ICD-10-CM | POA: Diagnosis not present

## 2016-11-29 DIAGNOSIS — E039 Hypothyroidism, unspecified: Secondary | ICD-10-CM | POA: Diagnosis not present

## 2016-11-29 DIAGNOSIS — E1165 Type 2 diabetes mellitus with hyperglycemia: Secondary | ICD-10-CM | POA: Diagnosis not present

## 2016-12-05 DIAGNOSIS — E039 Hypothyroidism, unspecified: Secondary | ICD-10-CM | POA: Diagnosis not present

## 2016-12-05 DIAGNOSIS — E1165 Type 2 diabetes mellitus with hyperglycemia: Secondary | ICD-10-CM | POA: Diagnosis not present

## 2016-12-05 DIAGNOSIS — R69 Illness, unspecified: Secondary | ICD-10-CM | POA: Diagnosis not present

## 2016-12-05 DIAGNOSIS — I10 Essential (primary) hypertension: Secondary | ICD-10-CM | POA: Diagnosis not present

## 2016-12-05 DIAGNOSIS — Z125 Encounter for screening for malignant neoplasm of prostate: Secondary | ICD-10-CM | POA: Diagnosis not present

## 2016-12-05 DIAGNOSIS — E6609 Other obesity due to excess calories: Secondary | ICD-10-CM | POA: Diagnosis not present

## 2016-12-06 DIAGNOSIS — R69 Illness, unspecified: Secondary | ICD-10-CM | POA: Diagnosis not present

## 2016-12-09 DIAGNOSIS — H2511 Age-related nuclear cataract, right eye: Secondary | ICD-10-CM | POA: Diagnosis not present

## 2017-01-30 DIAGNOSIS — R69 Illness, unspecified: Secondary | ICD-10-CM | POA: Diagnosis not present

## 2017-02-22 DIAGNOSIS — E039 Hypothyroidism, unspecified: Secondary | ICD-10-CM | POA: Diagnosis not present

## 2017-02-22 DIAGNOSIS — Z125 Encounter for screening for malignant neoplasm of prostate: Secondary | ICD-10-CM | POA: Diagnosis not present

## 2017-02-22 DIAGNOSIS — E1065 Type 1 diabetes mellitus with hyperglycemia: Secondary | ICD-10-CM | POA: Diagnosis not present

## 2017-02-22 DIAGNOSIS — I10 Essential (primary) hypertension: Secondary | ICD-10-CM | POA: Diagnosis not present

## 2017-02-22 DIAGNOSIS — Z0001 Encounter for general adult medical examination with abnormal findings: Secondary | ICD-10-CM | POA: Diagnosis not present

## 2017-02-24 ENCOUNTER — Ambulatory Visit (INDEPENDENT_AMBULATORY_CARE_PROVIDER_SITE_OTHER): Payer: Medicare HMO | Admitting: Urology

## 2017-02-24 ENCOUNTER — Encounter: Payer: Self-pay | Admitting: Urology

## 2017-02-24 VITALS — BP 136/64 | HR 88 | Ht 71.0 in | Wt 235.2 lb

## 2017-02-24 DIAGNOSIS — N400 Enlarged prostate without lower urinary tract symptoms: Secondary | ICD-10-CM

## 2017-02-24 NOTE — Addendum Note (Signed)
Addended by: Honor Loh on: 02/24/2017 10:30 AM   Modules accepted: Orders

## 2017-02-24 NOTE — Progress Notes (Signed)
02/24/2017 10:10 AM   [name] 1941/06/23 [id number]  Referring provider: Lyndon Code, MD [address] [place], [place] [address]  Chief Complaint  Patient presents with  . Elevated PSA    HPI: The patient is a 76 year old gentleman admitted for evaluation of his PSA.  His PSA was 3.0 in February 2018. The patient denies any genitourinary history. He has no voiding issues. He has nocturia 0-1. He feels like he empties his bladder. He has a good stream. He denies intermittency. He denies history of nephrolithiasis and hematuria.   PMH: Past Medical History:  Diagnosis Date  . Agent orange exposure    "[name]"  . Arthritis    "left knee" (03/18/2014)  . Closed pelvic fracture (HCC) 03/18/2014   "multiple S/P fall w/loss of consciousness; CBG 29"  . Encephalopathy acute 08/16/2013  . GERD (gastroesophageal reflux disease)   . High cholesterol   . Hypertension   . Hypothyroidism   . Type II diabetes mellitus (HCC)     Surgical History: Past Surgical History:  Procedure Laterality Date  . CARPAL TUNNEL RELEASE Right 1980's  . CARPAL TUNNEL RELEASE Left 05/05/2016   Procedure: CARPAL TUNNEL RELEASE;  Surgeon: Kennedy Bucker, MD;  Location: [facility] ORS;  Service: Orthopedics;  Laterality: Left;  . CATARACT EXTRACTION W/PHACO Left 05/12/2015   Procedure: CATARACT EXTRACTION PHACO AND INTRAOCULAR LENS PLACEMENT (IOC);  Surgeon: Galen Manila, MD;  Location: [facility] ORS;  Service: Ophthalmology;  Laterality: Left;  [place] 01:53.1 AP% 21.4 CDE 24.30 fluid pack lot # [id number]  . EYE SURGERY    . INCISION AND DRAINAGE OF WOUND Left 1988   "staph infection" left leg  . KNEE ARTHROPLASTY Left 12/28/2015   Procedure: COMPUTER ASSISTED TOTAL KNEE ARTHROPLASTY;  Surgeon: Donato Heinz, MD;  Location: [facility] ORS;  Service: Orthopedics;  Laterality: Left;  . ORIF WRIST FRACTURE Left 05/05/2016   Procedure: OPEN REDUCTION INTERNAL FIXATION (ORIF) WRIST FRACTURE;  Surgeon: Kennedy Bucker, MD;   Location: [facility] ORS;  Service: Orthopedics;  Laterality: Left;    Home Medications:  Allergies as of 02/24/2017      Reactions   Insulins Other (See Comments)   Pt states that no insulin works for him, but Lantus and Humulin R.     Penicillins Rash, Other (See Comments)   Has patient had a PCN reaction causing immediate rash, facial/tongue/throat swelling, SOB or lightheadedness with hypotension: No Has patient had a PCN reaction causing severe rash involving mucus membranes or skin necrosis: No Has patient had a PCN reaction that required hospitalization No Has patient had a PCN reaction occurring within the last 10 years: No If all of the above answers are "NO", then may proceed with Cephalosporin use.      Medication List       Accurate as of 02/24/17 10:10 AM. Always use your most recent med list.          aspirin EC 81 MG tablet Take 81 mg by mouth daily.   atorvastatin 40 MG tablet Commonly known as:  LIPITOR Take 40 mg by mouth at bedtime.   benazepril 40 MG tablet Commonly known as:  LOTENSIN Take 40 mg by mouth daily.   cholecalciferol 1000 units tablet Commonly known as:  VITAMIN D Take 1,000 Units by mouth daily.   glucosamine-chondroitin 500-400 MG tablet Take 1 tablet by mouth 3 (three) times daily.   hydrochlorothiazide 25 MG tablet Commonly known as:  HYDRODIURIL Take 25 mg by mouth daily.  insulin glargine 100 UNIT/ML injection Commonly known as:  LANTUS Inject 60 Units into the skin daily.   insulin regular 250 units/2.7mL (100 units/mL) injection Commonly known as:  NOVOLIN R,HUMULIN R Inject 15 Units into the skin 2 (two) times daily before a meal.   pantoprazole 40 MG tablet Commonly known as:  PROTONIX Take 40 mg by mouth daily.   pyridOXINE 100 MG tablet Commonly known as:  VITAMIN B-6 Take 100 mg by mouth daily.   thyroid 120 MG tablet Commonly known as:  ARMOUR Take 120 mg by mouth daily before breakfast.       Allergies:    Allergies  Allergen Reactions  . Insulins Other (See Comments)    Pt states that no insulin works for him, but Lantus and Humulin R.    . Penicillins Rash and Other (See Comments)    Has patient had a PCN reaction causing immediate rash, facial/tongue/throat swelling, SOB or lightheadedness with hypotension: No Has patient had a PCN reaction causing severe rash involving mucus membranes or skin necrosis: No Has patient had a PCN reaction that required hospitalization No Has patient had a PCN reaction occurring within the last 10 years: No If all of the above answers are "NO", then may proceed with Cephalosporin use.    Family History: Family History  Problem Relation Age of Onset  . Colon cancer Mother   . Prostate cancer Neg Hx   . Bladder Cancer Neg Hx   . Kidney cancer Neg Hx     Social History:  reports that he has quit smoking. His smoking use included Cigarettes. He has a 90.00 pack-year smoking history. He has never used smokeless tobacco. He reports that he does not drink alcohol or use drugs.  ROS: UROLOGY Frequent Urination?: No Hard to postpone urination?: No Burning/pain with urination?: No Get up at night to urinate?: No Leakage of urine?: No Urine stream starts and stops?: No Trouble starting stream?: No Do you have to strain to urinate?: No Blood in urine?: No Urinary tract infection?: No Sexually transmitted disease?: No Injury to kidneys or bladder?: No Painful intercourse?: No Weak stream?: No Erection problems?: No Penile pain?: No  Gastrointestinal Nausea?: No Vomiting?: No Indigestion/heartburn?: No Diarrhea?: No Constipation?: No  Constitutional Fever: No Night sweats?: No Weight loss?: No Fatigue?: No  Skin Skin rash/lesions?: No Itching?: No  Eyes Blurred vision?: No Double vision?: No  Ears/Nose/Throat Sore throat?: No Sinus problems?: No  Hematologic/Lymphatic Swollen glands?: No Easy bruising?: No  Cardiovascular Leg  swelling?: No Chest pain?: No  Respiratory Cough?: No Shortness of breath?: No  Endocrine Excessive thirst?: No  Musculoskeletal Back pain?: No Joint pain?: No  Neurological Headaches?: No Dizziness?: No  Psychologic Depression?: No Anxiety?: No  Physical Exam: BP 136/64 (BP Location: Left Arm, Patient Position: Sitting, Cuff Size: Normal)   Pulse 88   Ht  (1.803 m)   Wt 235 lb 3.2 oz (106.7 kg)   BMI 32.80 kg/m   Constitutional:  Alert and oriented, No acute distress. HEENT: [place] AT, moist mucus membranes.  Trachea midline, no masses. Cardiovascular: No clubbing, cyanosis, or edema. Respiratory: Normal respiratory effort, no increased work of breathing. GI: Abdomen is soft, nontender, nondistended, no abdominal masses GU: No CVA tenderness. Patient refused digital rectal exam. Skin: No rashes, bruises or suspicious lesions. Lymph: No cervical or inguinal adenopathy. Neurologic: Grossly intact, no focal deficits, moving all 4 extremities. Psychiatric: Normal mood and affect.  Laboratory Data: Lab Results  Component  Value Date   WBC 10.9 (H) 12/30/2015   HGB 11.3 (L) 12/30/2015   HCT 33.0 (L) 12/30/2015   MCV 90.6 12/30/2015   PLT 148 (L) 12/30/2015    Lab Results  Component Value Date   CREATININE 0.99 05/04/2016    Lab Results  Component Value Date   PSA 2.32 04/11/2005    No results found for: TESTOSTERONE  Lab Results  Component Value Date   HGBA1C 5.8 12/16/2015    Urinalysis    Component Value Date/Time   COLORURINE AMBER (A) 12/16/2015 0838   APPEARANCEUR HAZY (A) 12/16/2015 0838   LABSPEC 1.010 12/16/2015 0838   PHURINE 6.0 12/16/2015 0838   GLUCOSEU >500 (A) 12/16/2015 0838   HGBUR NEGATIVE 12/16/2015 0838   BILIRUBINUR NEGATIVE 12/16/2015 0838   KETONESUR NEGATIVE 12/16/2015 0838   PROTEINUR NEGATIVE 12/16/2015 0838   UROBILINOGEN 0.2 08/19/2013 0003   NITRITE NEGATIVE 12/16/2015 0838   LEUKOCYTESUR NEGATIVE 12/16/2015 0838      Assessment & Plan:    1. BPH I had a very long discussion with the patient discussing the role of PSA testing to detect prostate cancer. We discussed that his PSA currently is only 3 which is actually on the low side for someone his age which is reassuring. We did discuss that for someone in their late 39s a PSA of less than 10 is considered normal. We also discussed the American u[employer] Association guidelines for prostate cancer screening via PSA between the ages of 62 and 68. Given his relatively low PSA for his age and his current age, I recommend stopping further PSA testing at this time based on these guidelines. No further urological workup is indicated. The patient will follow-up with [place] as needed.   Return if symptoms worsen or fail to improve.  Hildred Laser, MD  [facility] [address], [address] [place], [place] [address] [phone]

## 2017-04-27 DIAGNOSIS — R69 Illness, unspecified: Secondary | ICD-10-CM | POA: Diagnosis not present

## 2017-05-23 ENCOUNTER — Observation Stay: Payer: Medicare HMO

## 2017-05-23 ENCOUNTER — Emergency Department: Payer: Medicare HMO

## 2017-05-23 ENCOUNTER — Encounter: Payer: Self-pay | Admitting: Medical Oncology

## 2017-05-23 ENCOUNTER — Observation Stay
Admission: EM | Admit: 2017-05-23 | Discharge: 2017-05-24 | Disposition: A | Discharge status: Home / Self Care | Payer: Medicare HMO | Attending: Internal Medicine | Admitting: Internal Medicine

## 2017-05-23 DIAGNOSIS — Z7982 Long term (current) use of aspirin: Secondary | ICD-10-CM | POA: Insufficient documentation

## 2017-05-23 DIAGNOSIS — Z87891 Personal history of nicotine dependence: Secondary | ICD-10-CM | POA: Diagnosis not present

## 2017-05-23 DIAGNOSIS — E1122 Type 2 diabetes mellitus with diabetic chronic kidney disease: Secondary | ICD-10-CM | POA: Diagnosis not present

## 2017-05-23 DIAGNOSIS — Z574 Occupational exposure to toxic agents in agriculture: Secondary | ICD-10-CM | POA: Insufficient documentation

## 2017-05-23 DIAGNOSIS — E1136 Type 2 diabetes mellitus with diabetic cataract: Secondary | ICD-10-CM | POA: Insufficient documentation

## 2017-05-23 DIAGNOSIS — B351 Tinea unguium: Secondary | ICD-10-CM | POA: Diagnosis not present

## 2017-05-23 DIAGNOSIS — M1990 Unspecified osteoarthritis, unspecified site: Secondary | ICD-10-CM | POA: Insufficient documentation

## 2017-05-23 DIAGNOSIS — I679 Cerebrovascular disease, unspecified: Secondary | ICD-10-CM | POA: Diagnosis not present

## 2017-05-23 DIAGNOSIS — E11649 Type 2 diabetes mellitus with hypoglycemia without coma: Principal | ICD-10-CM | POA: Insufficient documentation

## 2017-05-23 DIAGNOSIS — N183 Chronic kidney disease, stage 3 (moderate): Secondary | ICD-10-CM | POA: Insufficient documentation

## 2017-05-23 DIAGNOSIS — K219 Gastro-esophageal reflux disease without esophagitis: Secondary | ICD-10-CM | POA: Diagnosis not present

## 2017-05-23 DIAGNOSIS — E785 Hyperlipidemia, unspecified: Secondary | ICD-10-CM | POA: Diagnosis not present

## 2017-05-23 DIAGNOSIS — Z800 Family history of malignant neoplasm of digestive organs: Secondary | ICD-10-CM | POA: Insufficient documentation

## 2017-05-23 DIAGNOSIS — Z79899 Other long term (current) drug therapy: Secondary | ICD-10-CM | POA: Diagnosis not present

## 2017-05-23 DIAGNOSIS — I493 Ventricular premature depolarization: Secondary | ICD-10-CM | POA: Diagnosis not present

## 2017-05-23 DIAGNOSIS — I700 Atherosclerosis of aorta: Secondary | ICD-10-CM | POA: Insufficient documentation

## 2017-05-23 DIAGNOSIS — E162 Hypoglycemia, unspecified: Secondary | ICD-10-CM

## 2017-05-23 DIAGNOSIS — R4182 Altered mental status, unspecified: Secondary | ICD-10-CM | POA: Diagnosis not present

## 2017-05-23 DIAGNOSIS — Z8619 Personal history of other infectious and parasitic diseases: Secondary | ICD-10-CM | POA: Diagnosis not present

## 2017-05-23 DIAGNOSIS — I517 Cardiomegaly: Secondary | ICD-10-CM | POA: Insufficient documentation

## 2017-05-23 DIAGNOSIS — E7800 Pure hypercholesterolemia, unspecified: Secondary | ICD-10-CM | POA: Diagnosis not present

## 2017-05-23 DIAGNOSIS — N390 Urinary tract infection, site not specified: Secondary | ICD-10-CM | POA: Diagnosis not present

## 2017-05-23 DIAGNOSIS — Z794 Long term (current) use of insulin: Secondary | ICD-10-CM | POA: Diagnosis not present

## 2017-05-23 DIAGNOSIS — L03116 Cellulitis of left lower limb: Secondary | ICD-10-CM | POA: Diagnosis not present

## 2017-05-23 DIAGNOSIS — G9340 Encephalopathy, unspecified: Secondary | ICD-10-CM | POA: Diagnosis not present

## 2017-05-23 DIAGNOSIS — E876 Hypokalemia: Secondary | ICD-10-CM | POA: Diagnosis not present

## 2017-05-23 DIAGNOSIS — G9349 Other encephalopathy: Secondary | ICD-10-CM | POA: Diagnosis not present

## 2017-05-23 DIAGNOSIS — Z9842 Cataract extraction status, left eye: Secondary | ICD-10-CM | POA: Insufficient documentation

## 2017-05-23 DIAGNOSIS — E1165 Type 2 diabetes mellitus with hyperglycemia: Secondary | ICD-10-CM | POA: Diagnosis not present

## 2017-05-23 DIAGNOSIS — I129 Hypertensive chronic kidney disease with stage 1 through stage 4 chronic kidney disease, or unspecified chronic kidney disease: Secondary | ICD-10-CM | POA: Diagnosis not present

## 2017-05-23 DIAGNOSIS — R410 Disorientation, unspecified: Secondary | ICD-10-CM

## 2017-05-23 DIAGNOSIS — Z888 Allergy status to other drugs, medicaments and biological substances status: Secondary | ICD-10-CM | POA: Insufficient documentation

## 2017-05-23 DIAGNOSIS — E039 Hypothyroidism, unspecified: Secondary | ICD-10-CM | POA: Diagnosis not present

## 2017-05-23 DIAGNOSIS — Z880 Allergy status to penicillin: Secondary | ICD-10-CM | POA: Insufficient documentation

## 2017-05-23 DIAGNOSIS — I10 Essential (primary) hypertension: Secondary | ICD-10-CM | POA: Diagnosis not present

## 2017-05-23 LAB — URINALYSIS, COMPLETE (UACMP) WITH MICROSCOPIC
Bacteria, UA: NONE SEEN
Bilirubin Urine: NEGATIVE
GLUCOSE, UA: NEGATIVE mg/dL
HGB URINE DIPSTICK: NEGATIVE
Ketones, ur: NEGATIVE mg/dL
Leukocytes, UA: NEGATIVE
NITRITE: NEGATIVE
PH: 6 (ref 5.0–8.0)
Protein, ur: NEGATIVE mg/dL
SPECIFIC GRAVITY, URINE: 1.018 (ref 1.005–1.030)

## 2017-05-23 LAB — COMPREHENSIVE METABOLIC PANEL
ALT: 28 U/L (ref 17–63)
AST: 27 U/L (ref 15–41)
Albumin: 3.9 g/dL (ref 3.5–5.0)
Alkaline Phosphatase: 76 U/L (ref 38–126)
Anion gap: 8 (ref 5–15)
BUN: 15 mg/dL (ref 6–20)
CHLORIDE: 107 mmol/L (ref 101–111)
CO2: 25 mmol/L (ref 22–32)
Calcium: 9.1 mg/dL (ref 8.9–10.3)
Creatinine, Ser: 1.11 mg/dL (ref 0.61–1.24)
Glucose, Bld: 55 mg/dL — ABNORMAL LOW (ref 65–99)
POTASSIUM: 3.4 mmol/L — AB (ref 3.5–5.1)
Sodium: 140 mmol/L (ref 135–145)
TOTAL PROTEIN: 6.7 g/dL (ref 6.5–8.1)
Total Bilirubin: 0.7 mg/dL (ref 0.3–1.2)

## 2017-05-23 LAB — GLUCOSE, CAPILLARY
GLUCOSE-CAPILLARY: 126 mg/dL — AB (ref 65–99)
GLUCOSE-CAPILLARY: 128 mg/dL — AB (ref 65–99)
GLUCOSE-CAPILLARY: 45 mg/dL — AB (ref 65–99)
GLUCOSE-CAPILLARY: 71 mg/dL (ref 65–99)
GLUCOSE-CAPILLARY: 143 mg/dL — AB (ref 65–99)
Glucose-Capillary: 51 mg/dL — ABNORMAL LOW (ref 65–99)
Glucose-Capillary: 21 mg/dL — CL (ref 65–99)
Glucose-Capillary: 91 mg/dL (ref 65–99)
Glucose-Capillary: 113 mg/dL — ABNORMAL HIGH (ref 65–99)
Glucose-Capillary: 134 mg/dL — ABNORMAL HIGH (ref 65–99)

## 2017-05-23 LAB — CBC
HEMATOCRIT: 40.5 % (ref 40.0–52.0)
Hemoglobin: 13.9 g/dL (ref 13.0–18.0)
MCH: 31.5 pg (ref 26.0–34.0)
MCHC: 34.3 g/dL (ref 32.0–36.0)
MCV: 91.8 fL (ref 80.0–100.0)
PLATELETS: 179 10*3/uL (ref 150–440)
RBC: 4.42 MIL/uL (ref 4.40–5.90)
RDW: 15.7 % — ABNORMAL HIGH (ref 11.5–14.5)
WBC: 10.4 10*3/uL (ref 3.8–10.6)

## 2017-05-23 LAB — TROPONIN I: Troponin I: <0.03 ng/mL (ref <0.03)

## 2017-05-23 LAB — TSH: TSH: 0.778 u[IU]/mL (ref 0.350–4.500)

## 2017-05-23 MED ORDER — DEXTROSE 50 % IV SOLN
INTRAVENOUS | Status: AC
  Filled 2017-05-23: qty 50

## 2017-05-23 MED ORDER — ENOXAPARIN SODIUM 40 MG/0.4ML ~~LOC~~ SOLN
40.0000 mg | SUBCUTANEOUS | Status: DC
  Administered 2017-05-23: 40 mg via SUBCUTANEOUS
  Filled 2017-05-23: qty 0.4

## 2017-05-23 MED ORDER — VITAMIN B-6 50 MG PO TABS
100.0000 mg | ORAL_TABLET | Freq: Every day | ORAL | Status: DC
  Administered 2017-05-24: 09:00:00 100 mg via ORAL
  Filled 2017-05-23: qty 2

## 2017-05-23 MED ORDER — OMEGA-3-ACID ETHYL ESTERS 1 G PO CAPS
1.0000 g | ORAL_CAPSULE | Freq: Every day | ORAL | Status: DC
  Administered 2017-05-24: 1 g via ORAL
  Filled 2017-05-23: qty 1

## 2017-05-23 MED ORDER — DEXTROSE 50 % IV SOLN
1.0000 | Freq: Once | INTRAVENOUS | Status: AC
  Administered 2017-05-23: 50 mL via INTRAVENOUS

## 2017-05-23 MED ORDER — HYDROCHLOROTHIAZIDE 25 MG PO TABS
25.0000 mg | ORAL_TABLET | Freq: Every day | ORAL | Status: DC
  Administered 2017-05-24: 25 mg via ORAL
  Filled 2017-05-23: qty 1

## 2017-05-23 MED ORDER — BENAZEPRIL HCL 40 MG PO TABS
40.0000 mg | ORAL_TABLET | Freq: Every day | ORAL | Status: DC
  Administered 2017-05-24: 09:00:00 40 mg via ORAL
  Filled 2017-05-23: qty 1

## 2017-05-23 MED ORDER — KCL IN DEXTROSE-NACL 20-5-0.45 MEQ/L-%-% IV SOLN
INTRAVENOUS | Status: AC
  Administered 2017-05-23: 100 mL/h via INTRAVENOUS
  Filled 2017-05-23: qty 1000

## 2017-05-23 MED ORDER — THYROID 120 MG PO TABS
120.0000 mg | ORAL_TABLET | Freq: Every day | ORAL | Status: DC
  Administered 2017-05-24: 09:00:00 120 mg via ORAL
  Filled 2017-05-23: qty 1

## 2017-05-23 MED ORDER — ASPIRIN EC 81 MG PO TBEC
81.0000 mg | DELAYED_RELEASE_TABLET | Freq: Every day | ORAL | Status: DC
  Administered 2017-05-24: 09:00:00 81 mg via ORAL
  Filled 2017-05-23: qty 1

## 2017-05-23 MED ORDER — ACETAMINOPHEN 650 MG RE SUPP: 650.0000 mg | Freq: Four times a day (QID) | RECTAL | Status: DC | PRN

## 2017-05-23 MED ORDER — PANTOPRAZOLE SODIUM 40 MG PO TBEC
40.0000 mg | DELAYED_RELEASE_TABLET | Freq: Every day | ORAL | Status: DC
  Administered 2017-05-24: 09:00:00 40 mg via ORAL
  Filled 2017-05-23: qty 1

## 2017-05-23 MED ORDER — OMEGA 3 1200 MG PO CAPS: 1200.0000 mg | ORAL_CAPSULE | Freq: Every day | ORAL | Status: DC

## 2017-05-23 MED ORDER — KCL IN DEXTROSE-NACL 20-5-0.45 MEQ/L-%-% IV SOLN
Freq: Once | INTRAVENOUS | Status: AC
  Administered 2017-05-23: 100 mL/h via INTRAVENOUS

## 2017-05-23 MED ORDER — DEXTROSE 50 % IV SOLN
INTRAVENOUS | Status: AC
  Administered 2017-05-23: 50 mL via INTRAVENOUS
  Filled 2017-05-23: qty 50

## 2017-05-23 MED ORDER — ATORVASTATIN CALCIUM 20 MG PO TABS
40.0000 mg | ORAL_TABLET | Freq: Every day | ORAL | Status: DC
Start: 2017-05-23 — End: 2017-05-24
  Administered 2017-05-23: 40 mg via ORAL
  Filled 2017-05-23: qty 2

## 2017-05-23 MED ORDER — DEXTROSE-NACL 5-0.9 % IV SOLN
INTRAVENOUS | Status: DC
  Administered 2017-05-23: via INTRAVENOUS

## 2017-05-23 MED ORDER — ACETAMINOPHEN 325 MG PO TABS: 650.0000 mg | ORAL_TABLET | Freq: Four times a day (QID) | ORAL | Status: DC | PRN

## 2017-05-23 NOTE — ED Notes (Signed)
This RN notified by Denzil MagnusonIris, RN that pt's CBG 21, pt given 1 amp D50 and 2 OJ. Pt is noted to continue to be confused as he was upon this RN's first assessment.

## 2017-05-23 NOTE — H&P (Signed)
[facility] [place] [facility]   PATIENT NAME: [name]    MR#:  [id number]  DATE OF BIRTH:  06-30-1941  DATE OF ADMISSION:  05/23/2017  PRIMARY CARE PHYSICIAN: Lyndon Code, MD   REQUESTING/REFERRING PHYSICIAN: Dr Minna Antis  CHIEF COMPLAINT:   Chief Complaint  Patient presents with  . Altered Mental Status    HISTORY OF PRESENT ILLNESS:  [name]  is a 76 y.o. male brought in with altered mental status. This morning he had an elevated blood sugar in the 400s. He normally wakes up at 4:30 AM but today he didn't wake up until 7 AM. They gave some insulin to bring it down in the 300s. The patient is confused here and is unable to give a great history. His wife already went home. Daughter present at the bedside that she didn't know how much insulin he gave this morning. He does not take oral medications. 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  PAST MEDICAL HISTORY:   Past Medical History:  Diagnosis Date  . Agent orange exposure    "[name]"  . Arthritis    "left knee" (03/18/2014)  . Closed pelvic fracture (HCC) 03/18/2014   "multiple S/P fall w/loss of consciousness; CBG 29"  . Encephalopathy acute 08/16/2013  . GERD (gastroesophageal reflux disease)   . High cholesterol   . Hypertension   . Hypothyroidism   . Type II diabetes mellitus (HCC)     PAST SURGICAL HISTORY:   Past Surgical History:  Procedure Laterality Date  . CARPAL TUNNEL RELEASE Right 1980's  . CARPAL TUNNEL RELEASE Left 05/05/2016   Procedure: CARPAL TUNNEL RELEASE;  Surgeon: Kennedy Bucker, MD;  Location: [facility] ORS;  Service: Orthopedics;  Laterality: Left;  . CATARACT EXTRACTION W/PHACO Left 05/12/2015   Procedure: CATARACT EXTRACTION PHACO AND INTRAOCULAR LENS PLACEMENT (IOC);  Surgeon: Galen Manila, MD;  Location: [facility] ORS;  Service: Ophthalmology;   Laterality: Left;  [place] 01:53.1 AP% 21.4 CDE 24.30 fluid pack lot # [id number]  . EYE SURGERY    . INCISION AND DRAINAGE OF WOUND Left 1988   "staph infection" left leg  . KNEE ARTHROPLASTY Left 12/28/2015   Procedure: COMPUTER ASSISTED TOTAL KNEE ARTHROPLASTY;  Surgeon: Donato Heinz, MD;  Location: [facility] ORS;  Service: Orthopedics;  Laterality: Left;  . ORIF WRIST FRACTURE Left 05/05/2016   Procedure: OPEN REDUCTION INTERNAL FIXATION (ORIF) WRIST FRACTURE;  Surgeon: Kennedy Bucker, MD;  Location: [facility] ORS;  Service: Orthopedics;  Laterality: Left;    SOCIAL HISTORY:   Social History  Substance Use Topics  . Smoking status: Former Smoker    Packs/day: 3.00    Years: 30.00    Types: Cigarettes  . Smokeless tobacco: Never Used     Comment: "quit smoking in 1988"  . Alcohol use No    FAMILY HISTORY:   Family History  Problem Relation Age of Onset  . Colon cancer Mother   . Cerebral aneurysm Father   . Prostate cancer Neg Hx   . Bladder Cancer Neg Hx   . Kidney cancer Neg Hx     DRUG ALLERGIES:   Allergies  Allergen Reactions  . Insulins Other (See Comments)    Pt states that no insulin works for him, but Lantus and Humulin R.    . Penicillins Rash and Other (See Comments)    Has patient had  a PCN reaction causing immediate rash, facial/tongue/throat swelling, SOB or lightheadedness with hypotension: No Has patient had a PCN reaction causing severe rash involving mucus membranes or skin necrosis: No Has patient had a PCN reaction that required hospitalization No Has patient had a PCN reaction occurring within the last 10 years: No If all of the above answers are "NO", then may proceed with Cephalosporin use.    REVIEW OF SYSTEMS:  CONSTITUTIONAL: No fever, fatigue or weakness.  EYES: No blurred or double vision. Wears glasses EARS, NOSE, AND THROAT: No tinnitus or ear pain. No sore throat. Decreased hearing RESPIRATORY: No cough, shortness of breath, wheezing or hemoptysis.   CARDIOVASCULAR: No chest pain, orthopnea, edema.  GASTROINTESTINAL: No nausea, vomiting, diarrhea or abdominal pain. No blood in bowel movements GENITOURINARY: No dysuria, hematuria.  ENDOCRINE: No polyuria, nocturia,  HEMATOLOGY: No anemia, easy bruising or bleeding SKIN: No rash or lesion. MUSCULOSKELETAL: No joint pain or arthritis.   NEUROLOGIC: No tingling, numbness, weakness.  PSYCHIATRY: No anxiety or depression.   MEDICATIONS AT HOME:   Prior to Admission medications   Medication Sig Start Date End Date Taking? Authorizing Provider  aspirin EC 81 MG tablet Take 81 mg by mouth daily.   Yes [provider]  atorvastatin (LIPITOR) 40 MG tablet Take 40 mg by mouth at bedtime.    Yes [provider]  benazepril (LOTENSIN) 40 MG tablet Take 40 mg by mouth daily.   Yes [provider]  glucosamine-chondroitin 500-400 MG tablet Take 3 tablets by mouth daily.   Yes [provider]  hydrochlorothiazide (HYDRODIURIL) 25 MG tablet Take 25 mg by mouth daily.   Yes [provider]  insulin glargine (LANTUS) 100 UNIT/ML injection Inject 61 Units into the skin daily.    Yes [provider]  insulin regular (NOVOLIN R,HUMULIN R) 100 units/mL injection Inject 16 Units into the skin 2 (two) times daily before a meal.    Yes [provider]  Omega 3 1200 MG CAPS Take 1,200 mg by mouth daily.   Yes [provider]  pantoprazole (PROTONIX) 40 MG tablet Take 40 mg by mouth daily.   Yes [provider]  pyridOXINE (VITAMIN B-6) 100 MG tablet Take 100 mg by mouth daily.   Yes [provider]  thyroid (ARMOUR) 120 MG tablet Take 120 mg by mouth daily before breakfast.   Yes [provider]      VITAL SIGNS:  Blood pressure 132/85, pulse 76, temperature 98.5 F (36.9 C), temperature source Oral, resp. rate (!) 31, height 6\' 1"  (1.854 m), weight 106.6 kg (235 lb), SpO2 91 %.  PHYSICAL EXAMINATION:   GENERAL:  76 y.o.-year-old patient lying in the bed with no acute distress.  EYES: Pupils equal, round, reactive to light and accommodation. No scleral icterus. Extraocular muscles intact.  HEENT: Head atraumatic, normocephalic. Oropharynx and nasopharynx clear.  NECK:  Supple, no jugular venous distention. No thyroid enlargement, no tenderness.  LUNGS: Decreased breath sounds bilaterally, no wheezing, rales,rhonchi or crepitation. No use of accessory muscles of respiration.  CARDIOVASCULAR: S1, S2 normal. No murmurs, rubs, or gallops.  ABDOMEN: Soft, nontender, nondistended. Bowel sounds present. No organomegaly or mass.  EXTREMITIES: No pedal edema, cyanosis, or clubbing.  NEUROLOGIC: Cranial nerves II through XII are intact. Muscle strength 5/5 in all extremities. Sensation intact. Gait not checked.  PSYCHIATRIC: The patient is alert and oriented x 3.  SKIN: No rash, lesion, or ulcer.   LABORATORY PANEL:   CBC  Recent Labs Lab 05/23/17 1523  WBC 10.4  HGB 13.9  HCT 40.5  PLT 179   ------------------------------------------------------------------------------------------------------------------  Chemistries   Recent Labs Lab 05/23/17 1523  NA 140  K 3.4*  CL 107  CO2 25  GLUCOSE 55*  BUN 15  CREATININE 1.11  CALCIUM 9.1  AST 27  ALT 28  ALKPHOS 76  BILITOT 0.7   ------------------------------------------------------------------------------------------------------------------  Cardiac Enzymes  Recent Labs Lab 05/23/17 1523  TROPONINI <0.03   ------------------------------------------------------------------------------------------------------------------  RADIOLOGY:  Ct Head Wo Contrast  Result Date: 05/23/2017 CLINICAL DATA:  76 year old hypertensive diabetic male with confusion. Elevated glucose. Initial encounter. EXAM: CT HEAD WITHOUT CONTRAST TECHNIQUE: Contiguous axial images were obtained from the base of the skull through the vertex without  intravenous contrast. COMPARISON:  08/14/2013 head CT. FINDINGS: Brain: No intracranial hemorrhage or CT evidence of large acute infarct. Chronic microvascular changes. Global atrophy without hydrocephalus. No intracranial mass lesion noted on this unenhanced exam. Vascular: Vascular calcification Skull: Negative Sinuses/Orbits: No acute orbital abnormality. Visualized paranasal sinuses are clear. Other: Mastoid air cells and middle ear cavities are clear. IMPRESSION: No intracranial hemorrhage or CT evidence of large acute infarct. Chronic microvascular changes. Atrophy. Electronically Signed   By: Lacy DuverneySteven  Olson M.D.   On: 05/23/2017 17:00    EKG:   Sinus rhythm 77 bpm PVCs  IMPRESSION AND PLAN:   1. Hypoglycemia with type 2 diabetes. Add on a hemoglobin A1c. Patient placed on a D5 drip. Since the patient only takes insulin at home mom hoping this should resolve within 24 hours. Fingersticks every 2 hours. Potentially will not need as much insulin upon going home. 2. Acute encephalopathy likely secondary to low sugar. 3. Hypo-kalemia. Replace potassium in IV fluids for 1 L. 4. Essential hypertension continue usual medications. 5. History of hyperthyroidism in the past and now hypothyroid on Armour Thyroid. Check a TSH.  6. Hyperlipidemia unspecified on atorvastatin 7. Nursing marked elevated respiratory rates I will obtain a chest x-ray  All the records are reviewed and case discussed with ED provider. Management plans discussed with the patient, family and they are in agreement.  CODE STATUS: Full code  TOTAL TIME TAKING CARE OF THIS PATIENT: 55 minutes.    Alford HighlandWIETING, Hubert Derstine M.D on 05/23/2017 at 8:29 PM  Between 7am to 6pm - Pager - [phone]  After 6pm call admission pager [phone]  [facility] Office  [phone]  CC: Primary care physician; Lyndon CodeKhan, Fozia M, MD

## 2017-05-23 NOTE — ED Provider Notes (Addendum)
[facility] Emergency Department Provider Note  Time seen: 4:41 PM  I have reviewed the triage vital signs and the nursing notes.   HISTORY  Chief Complaint Altered Mental Status    HPI [name] is a 76 y.o. male with a past medical history of gastric reflux, hypertension, hyperlipidemia, diabetes presents to the emergency department with confusion. According to the wife and the patient he awoke this morning around 6 AM was very confused per wife. She checked his blood glucose and was greater then 400 she states several hours later she does take insulin, long-acting insulin. Patient continued to act confused throughout the day so she dosed 5 more units of insulin found to the emergency department upon arrival patient has a blood glucose of 51 and was given juice. Wife states at baselinepatient has no confusion and can converse normally. Currently patient is awake alert oriented to person, time, situation, but not to place. Patient cannot tell me what hospital this was or what city.  Patient and wife deny any fever, recent illness, dysuria.  Past Medical History:  Diagnosis Date  . Agent orange exposure    "[name]"  . Arthritis    "left knee" (03/18/2014)  . Closed pelvic fracture (HCC) 03/18/2014   "multiple S/P fall w/loss of consciousness; CBG 29"  . Encephalopathy acute 08/16/2013  . GERD (gastroesophageal reflux disease)   . High cholesterol   . Hypertension   . Hypothyroidism   . Type II diabetes mellitus [facility])     Patient Active Problem List   Diagnosis Date Noted  . S/P total knee arthroplasty 12/28/2015  . ARF (acute renal failure) (HCC) 03/21/2014  . CKD (chronic kidney disease) stage 3, GFR 30-59 ml/min 03/21/2014  . Syncope 03/19/2014  . Multiple closed fractures of pelvis (HCC) 03/18/2014  . Hypoglycemia 03/18/2014  . Pelvis fracture (HCC) 03/18/2014  . Acute encephalopathy 08/14/2013  . ANOMALY, POSTERIOR SOFT TISSUE IMPINGEMENT  10/10/2006  . OSTEOMYELITIS, HX OF 10/10/2006  . ONYCHOMYCOSIS 08/17/2006  . DIABETES MELLITUS, TYPE II 08/17/2006  . HYPERLIPIDEMIA 08/17/2006  . CATARACT NOS 08/17/2006  . HYPERTENSION 08/17/2006  . DIVERTICULOSIS, COLON 08/17/2006  . CELLULITIS, LEG, LEFT 08/17/2006  . OSTEOARTHRITIS 08/17/2006  . FRACTURE, ANKLE 08/17/2006  . COLONIC POLYPS, HX OF 08/17/2006    Past Surgical History:  Procedure Laterality Date  . CARPAL TUNNEL RELEASE Right 1980's  . CARPAL TUNNEL RELEASE Left 05/05/2016   Procedure: CARPAL TUNNEL RELEASE;  Surgeon: Kennedy Bucker, MD;  Location: [facility] ORS;  Service: Orthopedics;  Laterality: Left;  . CATARACT EXTRACTION W/PHACO Left 05/12/2015   Procedure: CATARACT EXTRACTION PHACO AND INTRAOCULAR LENS PLACEMENT (IOC);  Surgeon: Galen Manila, MD;  Location: [facility] ORS;  Service: Ophthalmology;  Laterality: Left;  [place] 01:53.1 AP% 21.4 CDE 24.30 fluid pack lot # [id number]  . EYE SURGERY    . INCISION AND DRAINAGE OF WOUND Left 1988   "staph infection" left leg  . KNEE ARTHROPLASTY Left 12/28/2015   Procedure: COMPUTER ASSISTED TOTAL KNEE ARTHROPLASTY;  Surgeon: Donato Heinz, MD;  Location: [facility] ORS;  Service: Orthopedics;  Laterality: Left;  . ORIF WRIST FRACTURE Left 05/05/2016   Procedure: OPEN REDUCTION INTERNAL FIXATION (ORIF) WRIST FRACTURE;  Surgeon: Kennedy Bucker, MD;  Location: [facility] ORS;  Service: Orthopedics;  Laterality: Left;    Prior to Admission medications   Medication Sig Start Date End Date Taking? Authorizing Provider  aspirin EC 81 MG tablet Take 81 mg by mouth daily.    [provider]  atorvastatin (LIPITOR) 40 MG tablet Take 40 mg by mouth at bedtime.     [provider]  benazepril (LOTENSIN) 40 MG tablet Take 40 mg by mouth daily.    [provider]  cholecalciferol (VITAMIN D) 1000 units tablet Take 1,000 Units by mouth daily.    [provider]  glucosamine-chondroitin 500-400 MG tablet Take 1 tablet by  mouth 3 (three) times daily.    [provider]  hydrochlorothiazide (HYDRODIURIL) 25 MG tablet Take 25 mg by mouth daily.    [provider]  insulin glargine (LANTUS) 100 UNIT/ML injection Inject 60 Units into the skin daily.     [provider]  insulin regular (NOVOLIN R,HUMULIN R) 100 units/mL injection Inject 15 Units into the skin 2 (two) times daily before a meal.     [provider]  pantoprazole (PROTONIX) 40 MG tablet Take 40 mg by mouth daily.    [provider]  pyridOXINE (VITAMIN B-6) 100 MG tablet Take 100 mg by mouth daily.    [provider]  thyroid (ARMOUR) 120 MG tablet Take 120 mg by mouth daily before breakfast.    [provider]    Allergies  Allergen Reactions  . Insulins Other (See Comments)    Pt states that no insulin works for him, but Lantus and Humulin R.    . Penicillins Rash and Other (See Comments)    Has patient had a PCN reaction causing immediate rash, facial/tongue/throat swelling, SOB or lightheadedness with hypotension: No Has patient had a PCN reaction causing severe rash involving mucus membranes or skin necrosis: No Has patient had a PCN reaction that required hospitalization No Has patient had a PCN reaction occurring within the last 10 years: No If all of the above answers are "NO", then may proceed with Cephalosporin use.    Family History  Problem Relation Age of Onset  . Colon cancer Mother   . Prostate cancer Neg Hx   . Bladder Cancer Neg Hx   . Kidney cancer Neg Hx     Social History Social History  Substance Use Topics  . Smoking status: Former Smoker    Packs/day: 3.00    Years: 30.00    Types: Cigarettes  . Smokeless tobacco: Never Used     Comment: "quit smoking in 1988"  . Alcohol use No    Review of Systems Constitutional: Negative for fever. Cardiovascular: Negative for chest pain. Respiratory: Negative for shortness of breath. Gastrointestinal:  Negative for abdominal pain, vomiting and diarrhea. Genitourinary: Negative for dysuria. Skin: Negative for rash. Neurological: Negative for headache. Denies focal weakness or numbness.   All other ROS negative  ____________________________________________   PHYSICAL EXAM:  VITAL SIGNS: ED Triage Vitals  Enc Vitals Group     BP 05/23/17 1523 (!) 155/67     Pulse Rate 05/23/17 1523 93     Resp 05/23/17 1523 (!) 22     Temp 05/23/17 1523 98.5 F (36.9 C)     Temp Source 05/23/17 1523 Oral     SpO2 05/23/17 1523 96 %     Weight 05/23/17 1524 235 lb (106.6 kg)     Height 05/23/17 1524 6\' 1"  (1.854 m)     Head Circumference --      Peak Flow --      Pain Score --      Pain Loc --      Pain Edu? --      Excl. in  GC? --     Constitutional: Alert, disoriented to place Eyes: Normal exam ENT   Head: Normocephalic and atraumatic.   Mouth/Throat: Mucous membranes are moist. Cardiovascular: Normal rate, regular rhythm. No murmur Respiratory: Normal respiratory effort without tachypnea nor retractions. Breath sounds are clear  Gastrointestinal: Soft and nontender. No distention.   Musculoskeletal: Nontender with normal range of motion in all extremities. Neurologic:  Normal speech and language. No gross focal neurologic deficits. Equal grip strengths. No pronator drift. Cranial nerves intact. Skin:  Skin is warm, dry and intact.  Psychiatric: Mood and affect are normal. Speech and behavior are normal.   ____________________________________________    EKG  EKG reviewed and interpreted by myself shows normal sensorimotor 77 bpm, narrow QRS, normal axis, normal intervals, no concerning ST changes noted at this time. Occasional PVC.  ____________________________________________    RADIOLOGY  CT head shows no acute abnormality.  ____________________________________________   INITIAL IMPRESSION / ASSESSMENT AND PLAN / ED COURSE  Pertinent labs & imaging results that  were available during my care of the patient were reviewed by me and considered in my medical decision making (see chart for details).  Patient presents the emergency department for confusion since this morning. Overall the patient appears well, he converses well but does show signs of some confusion such as cannot tell me what hospital or what city this is. He could tell me the month and the year and the president and he could tell me about the current situation. Wife denies any confusion at baseline. We will check labs, urinalysis, CT head and continue to closely monitor.  Patient's repeat blood glucose is down to 21, patient is mildly diaphoretic. We will give an amp of D50. I suspect a lot of the patient's confusion today is due to hypoglycemia however initially in the morning the patient was hyperglycemic and the wife states confusion at that time as well. She states the patient attempted to drive her car she took the keys from her but he found a second set of keys and drove to his doctor's office. Patient still appears confused at times, we will admit to the hospital for further treatment and workup once urinalysis results.  Patient's blood glucose has once again drop down into the 40s. We will place the patient on a D5 drip and admitted to the hospital for further treatment. Patient's urinalysis is negative.  CRITICAL CARE Performed by: Minna Antis   Total critical care time: 30 minutes  Critical care time was exclusive of separately billable procedures and treating other patients.  Critical care was necessary to treat or prevent imminent or life-threatening deterioration.  Critical care was time spent personally by me on the following activities: development of treatment plan with patient and/or surrogate as well as nursing, discussions with consultants, evaluation of patient's response to treatment, examination of patient, obtaining history from patient or surrogate, ordering and  performing treatments and interventions, ordering and review of laboratory studies, ordering and review of radiographic studies, pulse oximetry and re-evaluation of patient's condition.   ____________________________________________   FINAL CLINICAL IMPRESSION(S) / ED DIAGNOSES  Confusion Altered mental status Hypoglycemia   Minna Antis, MD 05/23/17 Angelene Giovanni    Minna Antis, MD 05/23/17 [phone]

## 2017-05-23 NOTE — ED Notes (Signed)
This RN and MD notified that patient's CBG 45, VORB for 1amp D50. Administered by Fleet Contrasachel, RN.

## 2017-05-23 NOTE — ED Notes (Signed)
NAD noted at this time. Pt resting in bed with daughter at bedside. Pt is alert and pleasant. Will continue to monitor for further patient needs. This RN apologized for delay in coming back to room and explained for another RN checking blood sugar. Pt states understanding. Admitting MD to bedside.

## 2017-05-23 NOTE — ED Notes (Signed)
This RN to bedside, pt CBG 128 after patient given juice and amp D50. Pt is back at baseline since arriving to ER. Pt thanks this RN for taking good care of him. Pt is calm and pleasant. Family at bedside at this time. Explained will be back to check CBG again in approx 30-45 mins. Will continue to monitor for furtehr patient needs.

## 2017-05-23 NOTE — ED Triage Notes (Signed)
Pt with wife to triage reports of pt waking up this am with confusion. Pt went to bed last night at baseline. Per wife pts blood sugar this am was 408. Then at lunch it was 140 so he was given 5Units regular insulin at 1300. Upon arrival CBG checked in triage was 51. Pt given orange juice in triage.

## 2017-05-23 NOTE — ED Notes (Signed)
Recheck pt's CBG 71, MD notified of drop from 128, per MD recheck in 20-30 mins. Pt and family updated that CBG will be rechecked in 20-30 mins. Pt and family state understanding. No change in patient condition. Will continue to monitor for further patient needs.

## 2017-05-24 DIAGNOSIS — I10 Essential (primary) hypertension: Secondary | ICD-10-CM | POA: Diagnosis not present

## 2017-05-24 DIAGNOSIS — E11649 Type 2 diabetes mellitus with hypoglycemia without coma: Secondary | ICD-10-CM | POA: Diagnosis not present

## 2017-05-24 DIAGNOSIS — G9340 Encephalopathy, unspecified: Secondary | ICD-10-CM | POA: Diagnosis not present

## 2017-05-24 DIAGNOSIS — E876 Hypokalemia: Secondary | ICD-10-CM | POA: Diagnosis not present

## 2017-05-24 LAB — CBC
HEMATOCRIT: 36.8 % — AB (ref 40.0–52.0)
HEMOGLOBIN: 12.5 g/dL — AB (ref 13.0–18.0)
MCH: 31.2 pg (ref 26.0–34.0)
MCHC: 34.1 g/dL (ref 32.0–36.0)
MCV: 91.5 fL (ref 80.0–100.0)
Platelets: 142 10*3/uL — ABNORMAL LOW (ref 150–440)
RBC: 4.02 MIL/uL — AB (ref 4.40–5.90)
RDW: 16 % — ABNORMAL HIGH (ref 11.5–14.5)
WBC: 7.2 10*3/uL (ref 3.8–10.6)

## 2017-05-24 LAB — BASIC METABOLIC PANEL
ANION GAP: 4 — AB (ref 5–15)
BUN: 11 mg/dL (ref 6–20)
CO2: 27 mmol/L (ref 22–32)
Calcium: 8.4 mg/dL — ABNORMAL LOW (ref 8.9–10.3)
Chloride: 109 mmol/L (ref 101–111)
Creatinine, Ser: 0.81 mg/dL (ref 0.61–1.24)
GFR calc Af Amer: >60 mL/min (ref >60)
GLUCOSE: 80 mg/dL (ref 65–99)
POTASSIUM: 3.4 mmol/L — AB (ref 3.5–5.1)
Sodium: 140 mmol/L (ref 135–145)

## 2017-05-24 LAB — GLUCOSE, CAPILLARY
GLUCOSE-CAPILLARY: 103 mg/dL — AB (ref 65–99)
GLUCOSE-CAPILLARY: 140 mg/dL — AB (ref 65–99)
GLUCOSE-CAPILLARY: 323 mg/dL — AB (ref 65–99)
GLUCOSE-CAPILLARY: 399 mg/dL — AB (ref 65–99)
Glucose-Capillary: 105 mg/dL — ABNORMAL HIGH (ref 65–99)
Glucose-Capillary: 271 mg/dL — ABNORMAL HIGH (ref 65–99)
Glucose-Capillary: 71 mg/dL (ref 65–99)
Glucose-Capillary: 74 mg/dL (ref 65–99)

## 2017-05-24 MED ORDER — INSULIN GLARGINE 100 UNIT/ML ~~LOC~~ SOLN
25.0000 [IU] | Freq: Every day | SUBCUTANEOUS | Status: DC
  Administered 2017-05-24: 14:00:00 25 [IU] via SUBCUTANEOUS
  Filled 2017-05-24 (×2): qty 0.25

## 2017-05-24 MED ORDER — INSULIN REGULAR HUMAN 100 UNIT/ML IJ SOLN
10.0000 [IU] | Freq: Once | INTRAMUSCULAR | Status: DC
  Filled 2017-05-24 (×2): qty 0.1

## 2017-05-24 MED ORDER — POTASSIUM CHLORIDE CRYS ER 20 MEQ PO TBCR
40.0000 meq | EXTENDED_RELEASE_TABLET | Freq: Once | ORAL | Status: AC
  Administered 2017-05-24: 40 meq via ORAL
  Filled 2017-05-24: qty 2
  Filled 2017-05-24: qty 1

## 2017-05-24 MED ORDER — INSULIN GLARGINE 100 UNIT/ML ~~LOC~~ SOLN: 25.0000 [IU] | Freq: Every day | SUBCUTANEOUS | 11 refills | Status: DC

## 2017-05-24 MED ORDER — DEXTROSE 10 % IV SOLN
INTRAVENOUS | Status: DC
  Administered 2017-05-24: 03:00:00 via INTRAVENOUS

## 2017-05-24 MED ORDER — INSULIN REGULAR HUMAN 100 UNIT/ML IJ SOLN: 10.0000 [IU] | Freq: Three times a day (TID) | INTRAMUSCULAR | 11 refills | Status: DC

## 2017-05-24 MED ORDER — INSULIN REGULAR HUMAN 100 UNIT/ML IJ SOLN
10.0000 [IU] | Freq: Once | INTRAMUSCULAR | Status: DC
  Filled 2017-05-24: qty 0.1

## 2017-05-24 NOTE — Progress Notes (Signed)
Inpatient Diabetes Program Recommendations  AACE/ADA: New Consensus Statement on Inpatient Glycemic Control (2015)  Target Ranges:  Prepandial:   less than 140 mg/dL      Peak postprandial:   less than 180 mg/dL (1-2 hours)      Critically ill patients:  140 - 180 mg/dL   Lab Results  Component Value Date   GLUCAP 323 (H) 05/24/2017   HGBA1C 5.8 12/16/2015    Review of Glycemic Control  Spoke with patient about Dm control at home and reasons for Severe hyperglycemia at home. Patient did not do anything different prior to hyperglycemia. Patient denies sickness, infection, over the counter medications.  Per wife this is the second time he has had a severe high blood sugar in years. Encouraged patient to contact his MD to inform of hyperglycemia and keep close attention to his glucose levels at home the next few days. Informed patient to call 911 if need be for extreme hyperglycemia and hypoglycemia. Per wife and daughter patient is very savvy with glucose control and knowledge of DM. Patient has been with DM since 1970's.   Thanks,  Christena DeemShannon Russell Quinney RN, MSN, [facility] Inpatient Diabetes Coordinator Team Pager [phone] (8a-5p)

## 2017-05-24 NOTE — Plan of Care (Signed)
Problem: Education: Goal: Knowledge of [facility] Education information/materials will improve Outcome: Progressing Pt likes to be called [name]  Past Medical History:  Diagnosis Date  . Agent orange exposure    [place]  . Arthritis    "left knee" (03/18/2014)  . Closed pelvic fracture (HCC) 03/18/2014   "multiple S/P fall w/loss of consciousness; CBG 29"  . Encephalopathy acute 08/16/2013  . GERD (gastroesophageal reflux disease)   . High cholesterol   . Hypertension   . Hypothyroidism   . Type II diabetes mellitus (HCC)    Pt is well controlled with home medications

## 2017-05-24 NOTE — Progress Notes (Signed)
Received MD order to discharge patient to home, reviewed discharge instructions, homes meds and follow up appointment with patient and daughter and both verbalized understanding discharged to home in wheelchair

## 2017-05-24 NOTE — Care Management Obs Status (Signed)
MEDICARE OBSERVATION STATUS NOTIFICATION   Patient Details  Name: Tony BucyDonald R Junious MRN: [id number] Date of Birth: 02-Feb-1941   Medicare Observation Status Notification Given:  Yes    Gwenette GreetBrenda S Rhilyn Battle, RN 05/24/2017, 8:28 AM

## 2017-05-24 NOTE — Care Management Note (Signed)
Case Management Note  Patient Details  Name: [name] MRN: [id number] Date of Birth: 03-30-1941  Subjective/Objective: Admitted to [facility] under observation status with the diagnosis of hypoglycemia.     Lives with wife, Bonita QuinLinda. [phone]). Last seen Dr. Welton FlakesKhan about 3 months ago. Prescriptions [facility] at [facility] on [address].  [facility] following knee surgery. [address] x 1 day. Rolling walker and cane in the home, if needed. Takes care of all basic activities of daily living himself, drives. No falls. Great appetite. Wife will transport             Action/Plan: No follow-up needs identified.   Expected Discharge Date:                  Expected Discharge Plan:     In-House Referral:     Discharge planning Services     Post Acute Care Choice:    Choice offered to:     DME Arranged:    DME Agency:     HH Arranged:    HH Agency:     Status of Service:     If discussed at [employer] Meetings, dates discussed:    Additional Comments:  Gwenette GreetBrenda S Media Pizzini, RN MSN CCM Care Management [phone] 05/24/2017, 8:13 AM

## 2017-05-24 NOTE — Care Management Note (Signed)
Case Management Note  Patient Details  Name: [name] MRN: [id number] Date of Birth: [birth year]  Subjective/Objective:   Admitted to [facility] under observation status with the diagnosis of hypoglycemia. Lives with wife, Bonita QuinLinda [phone]). Last seen Dr. Welton FlakesKhan 3 months ago. [facility] [address]. Takes care of all basic activities of daily living himself, drives. [facility]. [address] x 1 day. No home oxygen. Rolling walker and cane in the home if needed.  No falls. Great appetite. Wife will transport.                 Action/Plan: No follow-up needs identified   Expected Discharge Date:  05/25/17               Expected Discharge Plan:     In-House Referral:     Discharge planning Services     Post Acute Care Choice:    Choice offered to:     DME Arranged:    DME Agency:     HH Arranged:    HH Agency:     Status of Service:     If discussed at [employer] Meetings, dates discussed:    Additional Comments:  Gwenette GreetBrenda S Trae Bovenzi, RN MSN CCM Care Management [phone] 05/24/2017, 8:29 AM

## 2017-05-24 NOTE — Care Management (Signed)
New feeds prescription given to [name], [facility] representative, Vital 1.5 cal liquid 237 ml per tube 6 times daily. 1 month supply. Gwenette GreetBrenda S Toma Arts Rn MSN CCM Care Management [phone]

## 2017-05-24 NOTE — Discharge Summary (Signed)
[facility] - [place] [facility]   PATIENT NAME: Tony RaiderDonald Joseph    MR#:  [id number]  DATE OF BIRTH:  [birth year]  DATE OF ADMISSION:  05/23/2017 ADMITTING PHYSICIAN: Alford Highlandichard Madlyn Crosby, MD  DATE OF DISCHARGE: 05/24/2017  3:00 PM  PRIMARY CARE PHYSICIAN: Lyndon CodeKhan, Fozia M, MD    ADMISSION DIAGNOSIS:  Confusion [R41.0] Altered mental status [R41.82] Hypoglycemia [E16.2]  DISCHARGE DIAGNOSIS:  Active Problems:   Hypoglycemia   SECONDARY DIAGNOSIS:   Past Medical History:  Diagnosis Date  . [place] exposure    [place]  . Arthritis    "left knee" (03/18/2014)  . Closed pelvic fracture (HCC) 03/18/2014   "multiple S/P fall w/loss of consciousness; CBG 29"  . Encephalopathy acute 08/16/2013  . GERD (gastroesophageal reflux disease)   . High cholesterol   . Hypertension   . Hypothyroidism   . Type II diabetes mellitus (HCC)     HOSPITAL COURSE:   1. Hypoglycemia with diabetes.  Patient stated he woke up at 7 AM on the day of admission and usually wakes up at 4:30 AM. His sugar was high above 400. He took his usual insulin. He had confusion and then came to the ER where he had 2 episodes of low sugars. He was put on D5 drip and then put on D10 drip overnight. I stop the D10 drip in the morning and his sugars did come up. I restarted 25 units of Lantus and short acting insulin prior to meals. I clear what happened to the hemoglobin A1c that I ordered it is still not back yet. I advised him he can titrate up on his Lantus 4 units every 3 days of sugar still above 200. I'd rather have his sugars too high than too low at this point. 2. Acute encephalopathy this has resolved 3. Hypokalemia this has been replaced 4. Essential hypertension no changes his medication 5. Hypothyroidism unspecified on Armour Thyroid 6. Hyperlipidemia unspecified on atorvastatin  DISCHARGE CONDITIONS:   Satisfactory  CONSULTS OBTAINED:   none  DRUG ALLERGIES:   Allergies   Allergen Reactions  . Insulins Other (See Comments)    Pt states that no insulin works for him, but Lantus and Humulin R.    . Penicillins Rash and Other (See Comments)    Has patient had a PCN reaction causing immediate rash, facial/tongue/throat swelling, SOB or lightheadedness with hypotension: No Has patient had a PCN reaction causing severe rash involving mucus membranes or skin necrosis: No Has patient had a PCN reaction that required hospitalization No Has patient had a PCN reaction occurring within the last 10 years: No If all of the above answers are "NO", then may proceed with Cephalosporin use.    DISCHARGE MEDICATIONS:   Discharge Medication List as of 05/24/2017  2:36 PM    CONTINUE these medications which have CHANGED   Details  insulin glargine (LANTUS) 100 UNIT/ML injection Inject 0.25 mLs (25 Units total) into the skin daily., Starting Wed 05/24/2017, No Print    insulin regular (NOVOLIN R,HUMULIN R) 100 units/mL injection Inject 0.1 mLs (10 Units total) into the skin 3 (three) times daily before meals., Starting Wed 05/24/2017, No Print      CONTINUE these medications which have NOT CHANGED   Details  aspirin EC 81 MG tablet Take 81 mg by mouth daily., Until Discontinued, Historical Med    atorvastatin (LIPITOR) 40 MG tablet Take 40 mg by mouth at bedtime. , Until Discontinued, Historical Med    benazepril (  LOTENSIN) 40 MG tablet Take 40 mg by mouth daily., Until Discontinued, Historical Med    glucosamine-chondroitin 500-400 MG tablet Take 3 tablets by mouth daily., Historical Med    hydrochlorothiazide (HYDRODIURIL) 25 MG tablet Take 25 mg by mouth daily., Until Discontinued, Historical Med    Omega 3 1200 MG CAPS Take 1,200 mg by mouth daily., Historical Med    pantoprazole (PROTONIX) 40 MG tablet Take 40 mg by mouth daily., Until Discontinued, Historical Med    pyridOXINE (VITAMIN B-6) 100 MG tablet Take 100 mg by mouth daily., Until Discontinued, Historical  Med    thyroid (ARMOUR) 120 MG tablet Take 120 mg by mouth daily before breakfast., Until Discontinued, Historical Med         DISCHARGE INSTRUCTIONS:   Follow-up PMD one week  If you experience worsening of your admission symptoms, develop shortness of breath, life threatening emergency, suicidal or homicidal thoughts you must seek medical attention immediately by calling 911 or calling your MD immediately  if symptoms less severe.  You Must read complete instructions/literature along with all the possible adverse reactions/side effects for all the Medicines you take and that have been prescribed to you. Take any new Medicines after you have completely understood and accept all the possible adverse reactions/side effects.   Please note  You were cared for by a hospitalist during your hospital stay. If you have any questions about your discharge medications or the care you received while you were in the hospital after you are discharged, you can call the unit and asked to speak with the hospitalist on call if the hospitalist that took care of you is not available. Once you are discharged, your primary care physician will handle any further medical issues. Please note that NO REFILLS for any discharge medications will be authorized once you are discharged, as it is imperative that you return to your primary care physician (or establish a relationship with a primary care physician if you do not have one) for your aftercare needs so that they can reassess your need for medications and monitor your lab values.    Today   CHIEF COMPLAINT:   Chief Complaint  Patient presents with  . Altered Mental Status    HISTORY OF PRESENT ILLNESS:  [name]  Tony Raideris a 76 y.o. male presented with altered mental status and 2 episodes of low sugars   VITAL SIGNS:  Blood pressure 126/61, pulse 68, temperature 98.8 F (37.1 C), temperature source Oral, resp. rate 16, height 5\' 11"  (1.803 m), weight  101.5 kg (223 lb 11.2 oz), SpO2 99 %.    PHYSICAL EXAMINATION:  GENERAL:  76 y.o.-year-old patient lying in the bed with no acute distress.  EYES: Pupils equal, round, reactive to light and accommodation. No scleral icterus. Extraocular muscles intact.  HEENT: Head atraumatic, normocephalic. Oropharynx and nasopharynx clear.  NECK:  Supple, no jugular venous distention. No thyroid enlargement, no tenderness.  LUNGS: Normal breath sounds bilaterally, no wheezing, rales,rhonchi or crepitation. No use of accessory muscles of respiration.  CARDIOVASCULAR: S1, S2 normal. No murmurs, rubs, or gallops.  ABDOMEN: Soft, non-tender, non-distended. Bowel sounds present. No organomegaly or mass.  EXTREMITIES: No pedal edema, cyanosis, or clubbing.  NEUROLOGIC: Cranial nerves II through XII are intact. Muscle strength 5/5 in all extremities. Sensation intact. Gait not checked.  PSYCHIATRIC: The patient is alert and oriented x 3.  SKIN: No obvious rash, lesion, or ulcer.   DATA REVIEW:   CBC  Recent Labs Lab 05/24/17  0424  WBC 7.2  HGB 12.5*  HCT 36.8*  PLT 142*    Chemistries   Recent Labs Lab 05/23/17 1523 05/24/17 0424  NA 140 140  K 3.4* 3.4*  CL 107 109  CO2 25 27  GLUCOSE 55* 80  BUN 15 11  CREATININE 1.11 0.81  CALCIUM 9.1 8.4*  AST 27  --   ALT 28  --   ALKPHOS 76  --   BILITOT 0.7  --     Cardiac Enzymes  Recent Labs Lab 05/23/17 1523  TROPONINI <0.03    Microbiology Results  Results for orders placed or performed during the hospital encounter of 05/04/16  Surgical pcr screen     Status: Abnormal   Collection Time: 05/04/16 12:04 PM  Result Value Ref Range Status   MRSA, PCR NEGATIVE NEGATIVE Final   Staphylococcus aureus POSITIVE (A) NEGATIVE Final    Comment:        The Xpert SA Assay (FDA approved for NASAL specimens in patients over 59 years of age), is one component of a comprehensive surveillance program.  Test performance has been validated by  [facility] for patients greater than or equal to 82 year old. It is not intended to diagnose infection nor to guide or monitor treatment.     RADIOLOGY:  Dg Chest 1 View  Result Date: 05/23/2017 CLINICAL DATA:  Confusion and weakness EXAM: CHEST 1 VIEW COMPARISON:  03/18/2014 FINDINGS: Borderline to mild cardiomegaly. No acute infiltrate or effusion. Aortic atherosclerosis. No pneumothorax. IMPRESSION: Border to mild cardiomegaly.  No edema or infiltrate Electronically Signed   By: Jasmine Pang M.D.   On: 05/23/2017 20:48   Ct Head Wo Contrast  Result Date: 05/23/2017 CLINICAL DATA:  76 year old hypertensive diabetic male with confusion. Elevated glucose. Initial encounter. EXAM: CT HEAD WITHOUT CONTRAST TECHNIQUE: Contiguous axial images were obtained from the base of the skull through the vertex without intravenous contrast. COMPARISON:  08/14/2013 head CT. FINDINGS: Brain: No intracranial hemorrhage or CT evidence of large acute infarct. Chronic microvascular changes. Global atrophy without hydrocephalus. No intracranial mass lesion noted on this unenhanced exam. Vascular: Vascular calcification Skull: Negative Sinuses/Orbits: No acute orbital abnormality. Visualized paranasal sinuses are clear. Other: Mastoid air cells and middle ear cavities are clear. IMPRESSION: No intracranial hemorrhage or CT evidence of large acute infarct. Chronic microvascular changes. Atrophy. Electronically Signed   By: Lacy Duverney M.D.   On: 05/23/2017 17:00      Management plans discussed with the patient, family and they are in agreement.  CODE STATUS:  Code Status History    Date Active Date Inactive Code Status Order ID Comments User Context   05/23/2017  8:28 PM 05/24/2017  6:09 PM Full Code [id number]  Alford Highland, MD ED   05/05/2016  3:53 PM 05/05/2016  6:55 PM Full Code [id number]  Kennedy Bucker, MD Inpatient   03/18/2014 10:50 PM 03/21/2014  7:40 PM Full Code [id number]  Eduard Clos, MD  Inpatient   08/14/2013  3:39 PM 08/20/2013  5:41 PM Full Code [id number]  Lorretta Harp, MD Inpatient    Advance Directive Documentation     Most Recent Value  Type of Advance Directive  Healthcare Power of Attorney  Pre-existing out of facility DNR order (yellow form or pink MOST form)  -  "MOST" Form in Place?  -      TOTAL TIME TAKING CARE OF THIS PATIENT: 35 minutes.    Alford Highland M.D on 05/24/2017 at 6:34 PM  Between 7am to 6pm - Pager - [phone]  After 6pm go to [email or web address] - password [email or web address]  [facility] Office  [phone]  CC: Primary care physician; Lyndon Code, MD

## 2017-05-25 LAB — HEMOGLOBIN A1C
HEMOGLOBIN A1C: 6.4 % — AB (ref 4.8–5.6)
MEAN PLASMA GLUCOSE: 137 mg/dL

## 2017-06-15 DIAGNOSIS — R69 Illness, unspecified: Secondary | ICD-10-CM | POA: Diagnosis not present

## 2017-07-10 DIAGNOSIS — R69 Illness, unspecified: Secondary | ICD-10-CM | POA: Diagnosis not present

## 2017-07-20 DIAGNOSIS — R69 Illness, unspecified: Secondary | ICD-10-CM | POA: Diagnosis not present

## 2017-08-22 DIAGNOSIS — Z23 Encounter for immunization: Secondary | ICD-10-CM | POA: Diagnosis not present

## 2017-08-22 DIAGNOSIS — I10 Essential (primary) hypertension: Secondary | ICD-10-CM | POA: Diagnosis not present

## 2017-08-22 DIAGNOSIS — R69 Illness, unspecified: Secondary | ICD-10-CM | POA: Diagnosis not present

## 2017-08-22 DIAGNOSIS — E1165 Type 2 diabetes mellitus with hyperglycemia: Secondary | ICD-10-CM | POA: Diagnosis not present

## 2017-10-04 DIAGNOSIS — R69 Illness, unspecified: Secondary | ICD-10-CM | POA: Diagnosis not present

## 2017-11-15 ENCOUNTER — Other Ambulatory Visit: Payer: Self-pay

## 2017-11-15 MED ORDER — INSULIN REGULAR HUMAN 100 UNIT/ML IJ SOLN: INTRAMUSCULAR | 1 refills | Status: DC

## 2017-11-16 ENCOUNTER — Other Ambulatory Visit: Payer: Self-pay

## 2017-11-16 MED ORDER — INSULIN REGULAR HUMAN 100 UNIT/ML IJ SOLN: INTRAMUSCULAR | 1 refills | Status: DC

## 2017-11-17 ENCOUNTER — Telehealth: Payer: Self-pay

## 2017-11-17 NOTE — Telephone Encounter (Signed)
Pt called wanting his Humulin changed for 15 units BID with meals and using sliding scale as need to 51 units TID with meals. Spoke to Dr. Beverely RisenFozia Khan and I called pt to advised him that she is not going to change the humulin to 51 units without seeing him and seeing the numbers he has written about how much insulin he is taking and what his blood sugars look like at his next appt. He was also advised to bring his wife with him to his next appt.

## 2017-11-17 NOTE — Telephone Encounter (Signed)
error 

## 2017-11-22 ENCOUNTER — Telehealth: Payer: Self-pay

## 2017-11-22 NOTE — Telephone Encounter (Signed)
Patient pharmacy called saying patient is saying that he needs more insulin, but I have previously spoken to him that he cannot as per Dr. Beverely RisenFozia Khan (refer to my last note). Pharmacy said his insurance said he can only get a 30 day supply without a prior auth. Being approved and with a prior auth. Being approved he can get 4 viles for a 90 day supply (they recommended this will be better). The number to get prior auth done is [phone]. If it says rejection it is non-formulatory.

## 2017-11-23 ENCOUNTER — Ambulatory Visit: Payer: Self-pay | Admitting: Nurse Practitioner

## 2017-11-23 DIAGNOSIS — E890 Postprocedural hypothyroidism: Secondary | ICD-10-CM | POA: Diagnosis not present

## 2017-11-23 DIAGNOSIS — I10 Essential (primary) hypertension: Secondary | ICD-10-CM | POA: Diagnosis not present

## 2017-11-23 DIAGNOSIS — E1165 Type 2 diabetes mellitus with hyperglycemia: Secondary | ICD-10-CM | POA: Diagnosis not present

## 2017-11-23 DIAGNOSIS — E785 Hyperlipidemia, unspecified: Secondary | ICD-10-CM | POA: Diagnosis not present

## 2017-11-23 DIAGNOSIS — E063 Autoimmune thyroiditis: Secondary | ICD-10-CM | POA: Diagnosis not present

## 2017-11-23 DIAGNOSIS — E0520 Thyrotoxicosis with toxic multinodular goiter without thyrotoxic crisis or storm: Secondary | ICD-10-CM | POA: Diagnosis not present

## 2017-11-28 ENCOUNTER — Ambulatory Visit (INDEPENDENT_AMBULATORY_CARE_PROVIDER_SITE_OTHER): Payer: Medicare HMO | Admitting: Internal Medicine

## 2017-11-28 ENCOUNTER — Encounter: Payer: Self-pay | Admitting: Internal Medicine

## 2017-11-28 VITALS — BP 149/78 | HR 96 | Resp 16 | Ht 71.0 in | Wt 239.2 lb

## 2017-11-28 DIAGNOSIS — E782 Mixed hyperlipidemia: Secondary | ICD-10-CM | POA: Diagnosis not present

## 2017-11-28 DIAGNOSIS — E039 Hypothyroidism, unspecified: Secondary | ICD-10-CM

## 2017-11-28 DIAGNOSIS — E162 Hypoglycemia, unspecified: Secondary | ICD-10-CM | POA: Diagnosis not present

## 2017-11-28 DIAGNOSIS — E119 Type 2 diabetes mellitus without complications: Secondary | ICD-10-CM

## 2017-11-28 DIAGNOSIS — Z125 Encounter for screening for malignant neoplasm of prostate: Secondary | ICD-10-CM | POA: Diagnosis not present

## 2017-11-28 MED ORDER — INSULIN GLARGINE 100 UNIT/ML SOLOSTAR PEN: 65.0000 [IU] | PEN_INJECTOR | Freq: Every day | SUBCUTANEOUS | 99 refills | Status: DC

## 2017-11-28 MED ORDER — HUMULIN R 100 UNIT/ML IJ SOLN: 15.0000 [IU] | Freq: Two times a day (BID) | INTRAMUSCULAR | 3 refills | Status: DC

## 2017-11-30 DIAGNOSIS — E890 Postprocedural hypothyroidism: Secondary | ICD-10-CM | POA: Diagnosis not present

## 2017-11-30 DIAGNOSIS — E1165 Type 2 diabetes mellitus with hyperglycemia: Secondary | ICD-10-CM | POA: Diagnosis not present

## 2017-11-30 DIAGNOSIS — E785 Hyperlipidemia, unspecified: Secondary | ICD-10-CM | POA: Diagnosis not present

## 2017-11-30 DIAGNOSIS — I10 Essential (primary) hypertension: Secondary | ICD-10-CM | POA: Diagnosis not present

## 2017-11-30 DIAGNOSIS — E0520 Thyrotoxicosis with toxic multinodular goiter without thyrotoxic crisis or storm: Secondary | ICD-10-CM | POA: Diagnosis not present

## 2017-11-30 DIAGNOSIS — E063 Autoimmune thyroiditis: Secondary | ICD-10-CM | POA: Diagnosis not present

## 2017-12-04 LAB — POCT GLYCOSYLATED HEMOGLOBIN (HGB A1C): HEMOGLOBIN A1C: 6.5

## 2017-12-04 NOTE — Progress Notes (Signed)
[facility] [address] [place], [place] [address]  Internal MEDICINE  Office Visit Note  Patient Name: [name]  [id number]  [id number]  Date of Service: 12/04/2017  Chief Complaint  Patient presents with  . Follow-up    insulin issues    HPI  Pt is here for routine follow up.  He is with his wife, discussed over use of insulin and risk of hypoglycemia Taking all meds as prescribed    Current Medication: Outpatient Encounter Medications as of 11/28/2017  Medication Sig  . aspirin EC 81 MG tablet Take 81 mg by mouth daily.  Marland Kitchen atorvastatin (LIPITOR) 40 MG tablet Take 40 mg by mouth at bedtime.   . benazepril (LOTENSIN) 40 MG tablet Take 40 mg by mouth daily.  Marland Kitchen glucosamine-chondroitin 500-400 MG tablet Take 3 tablets by mouth daily.  Marland Kitchen HUMULIN R 100 UNIT/ML injection Inject 0.15-0.17 mLs (15-17 Units total) into the skin 2 (two) times daily before a meal. And per sliding scale Pt is not to get novolin at all  . hydrochlorothiazide (HYDRODIURIL) 25 MG tablet Take 25 mg by mouth daily.  . Insulin Glargine (LANTUS SOLOSTAR) 100 UNIT/ML Solostar Pen Inject 65 Units into the skin daily at 10 pm.  . Omega 3 1200 MG CAPS Take 1,200 mg by mouth daily.  . pantoprazole (PROTONIX) 40 MG tablet Take 40 mg by mouth daily.  Marland Kitchen pyridOXINE (VITAMIN B-6) 100 MG tablet Take 100 mg by mouth daily.  Marland Kitchen thyroid (ARMOUR) 120 MG tablet Take 120 mg by mouth daily before breakfast.  . [DISCONTINUED] insulin glargine (LANTUS) 100 UNIT/ML injection Inject 0.25 mLs (25 Units total) into the skin daily.  . [DISCONTINUED] insulin regular (NOVOLIN R,HUMULIN R) 100 units/mL injection 17 units BID with meals and use a sliding scale if sugars get high   No facility-administered encounter medications on file as of 11/28/2017.     Surgical History: Past Surgical History:  Procedure Laterality Date  . CARPAL TUNNEL RELEASE Right 1980's  . CARPAL TUNNEL RELEASE Left 05/05/2016   Procedure:  CARPAL TUNNEL RELEASE;  Surgeon: Kennedy Bucker, MD;  Location: [facility] ORS;  Service: Orthopedics;  Laterality: Left;  . CATARACT EXTRACTION W/PHACO Left 05/12/2015   Procedure: CATARACT EXTRACTION PHACO AND INTRAOCULAR LENS PLACEMENT (IOC);  Surgeon: Galen Manila, MD;  Location: [facility] ORS;  Service: Ophthalmology;  Laterality: Left;  [place] 01:53.1 AP% 21.4 CDE 24.30 fluid pack lot # [id number]  . EYE SURGERY    . INCISION AND DRAINAGE OF WOUND Left 1988   "staph infection" left leg  . KNEE ARTHROPLASTY Left 12/28/2015   Procedure: COMPUTER ASSISTED TOTAL KNEE ARTHROPLASTY;  Surgeon: Donato Heinz, MD;  Location: [facility] ORS;  Service: Orthopedics;  Laterality: Left;  . ORIF WRIST FRACTURE Left 05/05/2016   Procedure: OPEN REDUCTION INTERNAL FIXATION (ORIF) WRIST FRACTURE;  Surgeon: Kennedy Bucker, MD;  Location: [facility] ORS;  Service: Orthopedics;  Laterality: Left;    Medical History: Past Medical History:  Diagnosis Date  . Agent orange exposure    "[name]"  . Arthritis    "left knee" (03/18/2014)  . Closed pelvic fracture (HCC) 03/18/2014   "multiple S/P fall w/loss of consciousness; CBG 29"  . Encephalopathy acute 08/16/2013  . GERD (gastroesophageal reflux disease)   . High cholesterol   . Hypertension   . Hypothyroidism   . Type II diabetes mellitus (HCC)     Family History: Family History  Problem Relation Age of Onset  . Colon cancer Mother   .  Cerebral aneurysm Father   . Prostate cancer Neg Hx   . Bladder Cancer Neg Hx   . Kidney cancer Neg Hx     Social History   Socioeconomic History  . Marital status: Married    Spouse name: Not on file  . Number of children: Not on file  . Years of education: Not on file  . Highest education level: Not on file  Social Needs  . Financial resource strain: Not on file  . Food insecurity - worry: Not on file  . Food insecurity - inability: Not on file  . Transportation needs - medical: Not on file  . Transportation needs -  non-medical: Not on file  Occupational History  . Not on file  Tobacco Use  . Smoking status: Former Smoker    Packs/day: 3.00    Years: 30.00    Pack years: 90.00    Types: Cigarettes  . Smokeless tobacco: Never Used  . Tobacco comment: "quit smoking in 1988"  Substance and Sexual Activity  . Alcohol use: No  . Drug use: No  . Sexual activity: No  Other Topics Concern  . Not on file  Social History Narrative  . Not on file     Review of Systems  Constitutional: Negative for chills, fatigue and unexpected weight change.  HENT: Negative for congestion, postnasal drip, rhinorrhea, sneezing and sore throat.   Eyes: Negative for redness.  Respiratory: Negative for cough, chest tightness and shortness of breath.   Cardiovascular: Negative for chest pain and palpitations.  Gastrointestinal: Negative for abdominal pain, constipation, diarrhea, nausea and vomiting.  Genitourinary: Negative for dysuria and frequency.  Musculoskeletal: Negative for arthralgias, back pain, joint swelling and neck pain.  Skin: Negative for rash.  Neurological: Negative.  Negative for tremors and numbness.  Hematological: Negative for adenopathy. Does not bruise/bleed easily.  Psychiatric/Behavioral: Negative for behavioral problems (Depression), sleep disturbance and suicidal ideas. The patient is not nervous/anxious.     Vital Signs: BP (!) 149/78 (BP Location: Left Arm, Patient Position: Sitting)   Pulse 96   Resp 16   Ht 5\' 11"  (1.803 m)   Wt 239 lb 3.2 oz (108.5 kg)   SpO2 95%   BMI 33.36 kg/m    Physical Exam  Constitutional: He is oriented to person, place, and time. He appears well-developed and well-nourished. No distress.  HENT:  Head: Normocephalic and atraumatic.  Mouth/Throat: Oropharynx is clear and moist. No oropharyngeal exudate.  Eyes: EOM are normal. Pupils are equal, round, and reactive to light.  Neck: Normal range of motion. Neck supple. No JVD present. No tracheal  deviation present. No thyromegaly present.  Cardiovascular: Normal rate, regular rhythm and normal heart sounds. Exam reveals no gallop and no friction rub.  No murmur heard. Pulmonary/Chest: Effort normal. No respiratory distress. He has no wheezes. He has no rales. He exhibits no tenderness.  Abdominal: Soft. Bowel sounds are normal.  Musculoskeletal: Normal range of motion.  Lymphadenopathy:    He has no cervical adenopathy.  Neurological: He is alert and oriented to person, place, and time. No cranial nerve deficit.  Skin: Skin is warm and dry. He is not diaphoretic.  Psychiatric: He has a normal mood and affect. His behavior is normal. Judgment and thought content normal.    Assessment/Plan: 1. Diabetes mellitus without complication (HCC) - POCT HgB A1C - HUMULIN R 100 UNIT/ML injection; Inject 0.15-0.17 mLs (15-17 Units total) into the skin 2 (two) times daily before a meal. And per  sliding scale Pt is not to get novolin at all  Dispense: 4 vial; Refill: 3 - Insulin Glargine (LANTUS SOLOSTAR) 100 UNIT/ML Solostar Pen; Inject 65 Units into the skin daily at 10 pm.  Dispense: 10 pen; Refill: PRN  2. Hypoglycemia - Avoid over use of insulin. Pt has developed encephalopathy  3. Mixed hyperlipidemia - Controlled   4. Screening PSA (prostate specific antigen) - Per urology  5. Hypothyroidism, unspecified type - S/P ablation for Grave's disease now on Synthroid replacement   General Counseling: Joaquim Nam understanding of the findings of todays visit and agrees with plan of treatment. I have discussed any further diagnostic evaluation that may be needed or ordered today. We also reviewed his medications today. he has been encouraged to call the office with any questions or concerns that should arise related to todays visit.   Diabetes Counseling:  1. Addition of ACE inh/ ARB'S for nephroprotection. 2. Diabetic foot care, prevention of complications.  3.Exercise and lose  weight.  4. Diabetic eye examination, 5. Monitor blood sugar closlely. nutrition counseling.  6.Sign and symptoms of hypoglycemia including shaking sweating,confusion and headaches.     Orders Placed This Encounter  Procedures  . POCT HgB A1C    Meds ordered this encounter  Medications  . HUMULIN R 100 UNIT/ML injection    Sig: Inject 0.15-0.17 mLs (15-17 Units total) into the skin 2 (two) times daily before a meal. And per sliding scale Pt is not to get novolin at all    Dispense:  4 vial    Refill:  3    Pt needs 10 ml vials only. He Is allergic to Novolin productts  . Insulin Glargine (LANTUS SOLOSTAR) 100 UNIT/ML Solostar Pen    Sig: Inject 65 Units into the skin daily at 10 pm.    Dispense:  10 pen    Refill:  PRN    Time spent: 30 Minutes   Dr Lyndon Code Internal medicine

## 2017-12-22 DIAGNOSIS — E119 Type 2 diabetes mellitus without complications: Secondary | ICD-10-CM | POA: Diagnosis not present

## 2017-12-24 ENCOUNTER — Other Ambulatory Visit: Payer: Self-pay | Admitting: Internal Medicine

## 2017-12-24 DIAGNOSIS — R69 Illness, unspecified: Secondary | ICD-10-CM | POA: Diagnosis not present

## 2017-12-25 DIAGNOSIS — R69 Illness, unspecified: Secondary | ICD-10-CM | POA: Diagnosis not present

## 2017-12-27 DIAGNOSIS — I10 Essential (primary) hypertension: Secondary | ICD-10-CM | POA: Diagnosis not present

## 2017-12-27 DIAGNOSIS — Z833 Family history of diabetes mellitus: Secondary | ICD-10-CM | POA: Diagnosis not present

## 2017-12-27 DIAGNOSIS — E669 Obesity, unspecified: Secondary | ICD-10-CM | POA: Diagnosis not present

## 2017-12-27 DIAGNOSIS — Z6833 Body mass index (BMI) 33.0-33.9, adult: Secondary | ICD-10-CM | POA: Diagnosis not present

## 2017-12-27 DIAGNOSIS — Z809 Family history of malignant neoplasm, unspecified: Secondary | ICD-10-CM | POA: Diagnosis not present

## 2017-12-27 DIAGNOSIS — K219 Gastro-esophageal reflux disease without esophagitis: Secondary | ICD-10-CM | POA: Diagnosis not present

## 2017-12-27 DIAGNOSIS — E785 Hyperlipidemia, unspecified: Secondary | ICD-10-CM | POA: Diagnosis not present

## 2017-12-27 DIAGNOSIS — E119 Type 2 diabetes mellitus without complications: Secondary | ICD-10-CM | POA: Diagnosis not present

## 2017-12-27 DIAGNOSIS — Z794 Long term (current) use of insulin: Secondary | ICD-10-CM | POA: Diagnosis not present

## 2017-12-27 DIAGNOSIS — E039 Hypothyroidism, unspecified: Secondary | ICD-10-CM | POA: Diagnosis not present

## 2018-01-02 ENCOUNTER — Ambulatory Visit (INDEPENDENT_AMBULATORY_CARE_PROVIDER_SITE_OTHER): Payer: Medicare HMO | Admitting: Internal Medicine

## 2018-01-02 ENCOUNTER — Telehealth: Payer: Self-pay | Admitting: Internal Medicine

## 2018-01-02 ENCOUNTER — Encounter: Payer: Self-pay | Admitting: Internal Medicine

## 2018-01-02 VITALS — BP 150/70 | HR 79 | Resp 16 | Ht 71.0 in | Wt 237.4 lb

## 2018-01-02 DIAGNOSIS — E11649 Type 2 diabetes mellitus with hypoglycemia without coma: Secondary | ICD-10-CM

## 2018-01-02 DIAGNOSIS — E782 Mixed hyperlipidemia: Secondary | ICD-10-CM

## 2018-01-02 NOTE — Progress Notes (Signed)
[facility] [address] [place], [place] [address]  Internal MEDICINE  Office Visit Note  Patient Name: [name]  [id number]  [id number]  Date of Service: 01/02/2018  Chief Complaint  Patient presents with  . Diabetes    follow up  . DMV paperwork    Diabetes  He presents for his follow-up diabetic visit. He has type 1 diabetes mellitus. MedicAlert identification noted. His disease course has been fluctuating. Pertinent negatives for hypoglycemia include no nervousness/anxiousness or tremors. There are no diabetic associated symptoms. Pertinent negatives for diabetes include no chest pain and no fatigue. Symptoms are stable. There are no diabetic complications. His weight is stable. His breakfast blood glucose range is generally 70-90 mg/dl. His dinner blood glucose range is generally 130-140 mg/dl. His overall blood glucose range is 130-140 mg/dl. (Checks sugars 5 x a day, risk of fluctuation is present ans is labile)  Other  This is a new ([facility] paper work.) problem. Pertinent negatives include no abdominal pain, arthralgias, chest pain, chills, congestion, coughing, fatigue, joint swelling, nausea, neck pain, numbness, rash, sore throat or vomiting.       Current Medication: Outpatient Encounter Medications as of 01/02/2018  Medication Sig  . aspirin EC 81 MG tablet Take 81 mg by mouth daily.  Marland Kitchen atorvastatin (LIPITOR) 40 MG tablet Take 40 mg by mouth at bedtime.   . benazepril (LOTENSIN) 40 MG tablet Take 40 mg by mouth daily.  Marland Kitchen glucosamine-chondroitin 500-400 MG tablet Take 3 tablets by mouth daily.  Marland Kitchen HUMULIN R 100 UNIT/ML injection Inject 0.15-0.17 mLs (15-17 Units total) into the skin 2 (two) times daily before a meal. And per sliding scale Pt is not to get novolin at all  . hydrochlorothiazide (HYDRODIURIL) 25 MG tablet Take 25 mg by mouth daily.  . Insulin Glargine (LANTUS SOLOSTAR) 100 UNIT/ML Solostar Pen Inject 65 Units into the skin daily at 10 pm.  .  Omega 3 1200 MG CAPS Take 1,200 mg by mouth daily.  . pantoprazole (PROTONIX) 40 MG tablet TAKE 1 TABLET BY MOUTH DAILY  . pyridOXINE (VITAMIN B-6) 100 MG tablet Take 100 mg by mouth daily.  Marland Kitchen thyroid (ARMOUR) 120 MG tablet Take 120 mg by mouth daily before breakfast.   No facility-administered encounter medications on file as of 01/02/2018.     Surgical History: Past Surgical History:  Procedure Laterality Date  . CARPAL TUNNEL RELEASE Right 1980's  . CARPAL TUNNEL RELEASE Left 05/05/2016   Procedure: CARPAL TUNNEL RELEASE;  Surgeon: Kennedy Bucker, MD;  Location: [facility] ORS;  Service: Orthopedics;  Laterality: Left;  . CATARACT EXTRACTION W/PHACO Left 05/12/2015   Procedure: CATARACT EXTRACTION PHACO AND INTRAOCULAR LENS PLACEMENT (IOC);  Surgeon: Galen Manila, MD;  Location: [facility] ORS;  Service: Ophthalmology;  Laterality: Left;  [place] 01:53.1 AP% 21.4 CDE 24.30 fluid pack lot # [id number]  . EYE SURGERY    . INCISION AND DRAINAGE OF WOUND Left 1988   "staph infection" left leg  . KNEE ARTHROPLASTY Left 12/28/2015   Procedure: COMPUTER ASSISTED TOTAL KNEE ARTHROPLASTY;  Surgeon: Donato Heinz, MD;  Location: [facility] ORS;  Service: Orthopedics;  Laterality: Left;  . ORIF WRIST FRACTURE Left 05/05/2016   Procedure: OPEN REDUCTION INTERNAL FIXATION (ORIF) WRIST FRACTURE;  Surgeon: Kennedy Bucker, MD;  Location: [facility] ORS;  Service: Orthopedics;  Laterality: Left;    Medical History: Past Medical History:  Diagnosis Date  . Agent orange exposure    "[name]"  . Arthritis    "left  knee" (03/18/2014)  . Closed pelvic fracture (HCC) 03/18/2014   "multiple S/P fall w/loss of consciousness; CBG 29"  . Encephalopathy acute 08/16/2013  . GERD (gastroesophageal reflux disease)   . High cholesterol   . Hypertension   . Hypothyroidism   . Type II diabetes mellitus (HCC)     Family History: Family History  Problem Relation Age of Onset  . Colon cancer Mother   . Cerebral aneurysm Father   .  Prostate cancer Neg Hx   . Bladder Cancer Neg Hx   . Kidney cancer Neg Hx     Social History   Socioeconomic History  . Marital status: Married    Spouse name: Not on file  . Number of children: Not on file  . Years of education: Not on file  . Highest education level: Not on file  Social Needs  . Financial resource strain: Not on file  . Food insecurity - worry: Not on file  . Food insecurity - inability: Not on file  . Transportation needs - medical: Not on file  . Transportation needs - non-medical: Not on file  Occupational History  . Not on file  Tobacco Use  . Smoking status: Former Smoker    Packs/day: 3.00    Years: 30.00    Pack years: 90.00    Types: Cigarettes  . Smokeless tobacco: Never Used  . Tobacco comment: "quit smoking in 1988"  Substance and Sexual Activity  . Alcohol use: No  . Drug use: No  . Sexual activity: No  Other Topics Concern  . Not on file  Social History Narrative  . Not on file      Review of Systems  Constitutional: Negative for chills, fatigue and unexpected weight change.  HENT: Positive for postnasal drip. Negative for congestion, rhinorrhea, sneezing and sore throat.   Eyes: Negative for redness.  Respiratory: Negative for cough, chest tightness and shortness of breath.   Cardiovascular: Negative for chest pain and palpitations.  Gastrointestinal: Negative for abdominal pain, constipation, diarrhea, nausea and vomiting.  Genitourinary: Negative for dysuria and frequency.  Musculoskeletal: Negative for arthralgias, back pain, joint swelling and neck pain.  Skin: Negative for rash.  Neurological: Negative.  Negative for tremors and numbness.  Hematological: Negative for adenopathy. Does not bruise/bleed easily.  Psychiatric/Behavioral: Negative for behavioral problems (Depression), sleep disturbance and suicidal ideas. The patient is not nervous/anxious.     Vital Signs: BP (!) 150/70 (BP Location: Left Arm, Patient Position:  Sitting)   Pulse 79   Resp 16   Ht 5\' 11"  (1.803 m)   Wt 237 lb 6.4 oz (107.7 kg)   SpO2 95%   BMI 33.11 kg/m    Physical Exam  Constitutional: He is oriented to person, place, and time. He appears well-developed and well-nourished. No distress.  HENT:  Head: Normocephalic and atraumatic.  Mouth/Throat: Oropharynx is clear and moist. No oropharyngeal exudate.  Eyes: EOM are normal. Pupils are equal, round, and reactive to light.  Neck: Normal range of motion. Neck supple. No JVD present. No tracheal deviation present. No thyromegaly present.  Cardiovascular: Normal rate, regular rhythm and normal heart sounds. Exam reveals no gallop and no friction rub.  No murmur heard. Pulmonary/Chest: Effort normal. No respiratory distress. He has no wheezes. He has no rales. He exhibits no tenderness.  Abdominal: Soft. Bowel sounds are normal.  Musculoskeletal: Normal range of motion.  Lymphadenopathy:    He has no cervical adenopathy.  Neurological: He is alert and oriented  to person, place, and time. No cranial nerve deficit.  Skin: Skin is warm and dry. He is not diaphoretic.  Psychiatric: He has a normal mood and affect. His behavior is normal. Judgment and thought content normal.   Assessment/Plan: 1. Uncontrolled type 2 diabetes mellitus with hypoglycemia without coma (HCC) Pt needs to continue to monitor his blood sugars 5 x a day due to risk of hypoglycemia. Otherwise hg a1c is normal. Continue Lantus and ss as before   2. Mixed hyperlipidemia - Controlled.  General Counseling: [name] verbalizes understanding of the findings of todays visit and agrees with plan of treatment. I have discussed any further diagnostic evaluation that may be needed or ordered today. We also reviewed his medications today. he has been encouraged to call the office with any questions or concerns that should arise related to todays visit. Paper work for [employer] is done. He should not drive at night Diabetes  Counseling:  1. Addition of ACE inh/ ARB'S for nephroprotection. 2. Diabetic foot care, prevention of complications.  3.Exercise and lose weight.  4. Diabetic eye examination, 5. Monitor blood sugar closlely. nutrition counseling.  6.Sign and symptoms of hypoglycemia including shaking sweating,confusion and headaches.    Time spent:30 Minutes          Dr Lyndon CodeFozia M Ashawn Rinehart Internal medicine

## 2018-01-02 NOTE — Telephone Encounter (Signed)
CALLED PATIENT TO INFORM HIM HIS PAPERWORK FOR DMV WAS READY FOR PICK UP.JW

## 2018-01-15 DIAGNOSIS — R69 Illness, unspecified: Secondary | ICD-10-CM | POA: Diagnosis not present

## 2018-01-21 ENCOUNTER — Inpatient Hospital Stay
Admission: EM | Admit: 2018-01-21 | Discharge: 2018-01-26 | DRG: 494 | Disposition: A | Discharge status: Skilled Nursing Facility | Payer: Medicare HMO | Attending: Orthopedic Surgery | Admitting: Orthopedic Surgery

## 2018-01-21 ENCOUNTER — Inpatient Hospital Stay: Payer: Medicare HMO

## 2018-01-21 ENCOUNTER — Emergency Department: Payer: Medicare HMO

## 2018-01-21 ENCOUNTER — Other Ambulatory Visit: Payer: Self-pay

## 2018-01-21 ENCOUNTER — Encounter: Payer: Self-pay | Admitting: Emergency Medicine

## 2018-01-21 DIAGNOSIS — Z77098 Contact with and (suspected) exposure to other hazardous, chiefly nonmedicinal, chemicals: Secondary | ICD-10-CM | POA: Diagnosis present

## 2018-01-21 DIAGNOSIS — Z79899 Other long term (current) drug therapy: Secondary | ICD-10-CM

## 2018-01-21 DIAGNOSIS — E1122 Type 2 diabetes mellitus with diabetic chronic kidney disease: Secondary | ICD-10-CM | POA: Diagnosis not present

## 2018-01-21 DIAGNOSIS — M79604 Pain in right leg: Secondary | ICD-10-CM | POA: Diagnosis not present

## 2018-01-21 DIAGNOSIS — T148XXA Other injury of unspecified body region, initial encounter: Secondary | ICD-10-CM | POA: Diagnosis not present

## 2018-01-21 DIAGNOSIS — E7800 Pure hypercholesterolemia, unspecified: Secondary | ICD-10-CM | POA: Diagnosis present

## 2018-01-21 DIAGNOSIS — E1169 Type 2 diabetes mellitus with other specified complication: Secondary | ICD-10-CM | POA: Diagnosis present

## 2018-01-21 DIAGNOSIS — S8291XA Unspecified fracture of right lower leg, initial encounter for closed fracture: Secondary | ICD-10-CM

## 2018-01-21 DIAGNOSIS — K219 Gastro-esophageal reflux disease without esophagitis: Secondary | ICD-10-CM | POA: Diagnosis not present

## 2018-01-21 DIAGNOSIS — S82431A Displaced oblique fracture of shaft of right fibula, initial encounter for closed fracture: Secondary | ICD-10-CM | POA: Diagnosis not present

## 2018-01-21 DIAGNOSIS — E119 Type 2 diabetes mellitus without complications: Secondary | ICD-10-CM | POA: Diagnosis not present

## 2018-01-21 DIAGNOSIS — Z87891 Personal history of nicotine dependence: Secondary | ICD-10-CM | POA: Diagnosis not present

## 2018-01-21 DIAGNOSIS — Z794 Long term (current) use of insulin: Secondary | ICD-10-CM | POA: Diagnosis not present

## 2018-01-21 DIAGNOSIS — S92301A Fracture of unspecified metatarsal bone(s), right foot, initial encounter for closed fracture: Secondary | ICD-10-CM | POA: Diagnosis not present

## 2018-01-21 DIAGNOSIS — I129 Hypertensive chronic kidney disease with stage 1 through stage 4 chronic kidney disease, or unspecified chronic kidney disease: Secondary | ICD-10-CM | POA: Diagnosis not present

## 2018-01-21 DIAGNOSIS — I10 Essential (primary) hypertension: Secondary | ICD-10-CM | POA: Diagnosis not present

## 2018-01-21 DIAGNOSIS — Z9889 Other specified postprocedural states: Secondary | ICD-10-CM

## 2018-01-21 DIAGNOSIS — Z880 Allergy status to penicillin: Secondary | ICD-10-CM | POA: Diagnosis not present

## 2018-01-21 DIAGNOSIS — Z7401 Bed confinement status: Secondary | ICD-10-CM | POA: Diagnosis not present

## 2018-01-21 DIAGNOSIS — W010XXA Fall on same level from slipping, tripping and stumbling without subsequent striking against object, initial encounter: Secondary | ICD-10-CM | POA: Diagnosis not present

## 2018-01-21 DIAGNOSIS — S82301D Unspecified fracture of lower end of right tibia, subsequent encounter for closed fracture with routine healing: Secondary | ICD-10-CM | POA: Diagnosis not present

## 2018-01-21 DIAGNOSIS — E785 Hyperlipidemia, unspecified: Secondary | ICD-10-CM | POA: Diagnosis present

## 2018-01-21 DIAGNOSIS — S82401A Unspecified fracture of shaft of right fibula, initial encounter for closed fracture: Secondary | ICD-10-CM | POA: Diagnosis present

## 2018-01-21 DIAGNOSIS — Z96652 Presence of left artificial knee joint: Secondary | ICD-10-CM | POA: Diagnosis present

## 2018-01-21 DIAGNOSIS — Z01818 Encounter for other preprocedural examination: Secondary | ICD-10-CM | POA: Diagnosis not present

## 2018-01-21 DIAGNOSIS — E1165 Type 2 diabetes mellitus with hyperglycemia: Secondary | ICD-10-CM | POA: Diagnosis not present

## 2018-01-21 DIAGNOSIS — S92341A Displaced fracture of fourth metatarsal bone, right foot, initial encounter for closed fracture: Secondary | ICD-10-CM | POA: Diagnosis present

## 2018-01-21 DIAGNOSIS — Z888 Allergy status to other drugs, medicaments and biological substances status: Secondary | ICD-10-CM | POA: Diagnosis not present

## 2018-01-21 DIAGNOSIS — S82201A Unspecified fracture of shaft of right tibia, initial encounter for closed fracture: Secondary | ICD-10-CM | POA: Diagnosis not present

## 2018-01-21 DIAGNOSIS — Y92000 Kitchen of unspecified non-institutional (private) residence as  the place of occurrence of the external cause: Secondary | ICD-10-CM

## 2018-01-21 DIAGNOSIS — W19XXXA Unspecified fall, initial encounter: Secondary | ICD-10-CM

## 2018-01-21 DIAGNOSIS — Z7984 Long term (current) use of oral hypoglycemic drugs: Secondary | ICD-10-CM | POA: Diagnosis not present

## 2018-01-21 DIAGNOSIS — S82401D Unspecified fracture of shaft of right fibula, subsequent encounter for closed fracture with routine healing: Secondary | ICD-10-CM | POA: Diagnosis not present

## 2018-01-21 DIAGNOSIS — S52325A Nondisplaced transverse fracture of shaft of left radius, initial encounter for closed fracture: Secondary | ICD-10-CM | POA: Diagnosis not present

## 2018-01-21 DIAGNOSIS — S92201A Fracture of unspecified tarsal bone(s) of right foot, initial encounter for closed fracture: Secondary | ICD-10-CM | POA: Diagnosis not present

## 2018-01-21 DIAGNOSIS — E039 Hypothyroidism, unspecified: Secondary | ICD-10-CM | POA: Diagnosis present

## 2018-01-21 DIAGNOSIS — S92331A Displaced fracture of third metatarsal bone, right foot, initial encounter for closed fracture: Secondary | ICD-10-CM | POA: Diagnosis not present

## 2018-01-21 DIAGNOSIS — R6889 Other general symptoms and signs: Secondary | ICD-10-CM | POA: Diagnosis not present

## 2018-01-21 DIAGNOSIS — M79661 Pain in right lower leg: Secondary | ICD-10-CM | POA: Diagnosis not present

## 2018-01-21 DIAGNOSIS — Z419 Encounter for procedure for purposes other than remedying health state, unspecified: Secondary | ICD-10-CM

## 2018-01-21 DIAGNOSIS — Z8781 Personal history of (healed) traumatic fracture: Secondary | ICD-10-CM

## 2018-01-21 DIAGNOSIS — N183 Chronic kidney disease, stage 3 (moderate): Secondary | ICD-10-CM | POA: Diagnosis not present

## 2018-01-21 DIAGNOSIS — S62340A Nondisplaced fracture of base of second metacarpal bone, right hand, initial encounter for closed fracture: Secondary | ICD-10-CM | POA: Diagnosis not present

## 2018-01-21 DIAGNOSIS — S92341D Displaced fracture of fourth metatarsal bone, right foot, subsequent encounter for fracture with routine healing: Secondary | ICD-10-CM | POA: Diagnosis not present

## 2018-01-21 DIAGNOSIS — S82241A Displaced spiral fracture of shaft of right tibia, initial encounter for closed fracture: Secondary | ICD-10-CM | POA: Diagnosis not present

## 2018-01-21 DIAGNOSIS — S92331D Displaced fracture of third metatarsal bone, right foot, subsequent encounter for fracture with routine healing: Secondary | ICD-10-CM | POA: Diagnosis not present

## 2018-01-21 DIAGNOSIS — S82231A Displaced oblique fracture of shaft of right tibia, initial encounter for closed fracture: Secondary | ICD-10-CM | POA: Diagnosis not present

## 2018-01-21 DIAGNOSIS — I152 Hypertension secondary to endocrine disorders: Secondary | ICD-10-CM | POA: Diagnosis present

## 2018-01-21 DIAGNOSIS — W19XXXD Unspecified fall, subsequent encounter: Secondary | ICD-10-CM | POA: Diagnosis not present

## 2018-01-21 LAB — BASIC METABOLIC PANEL
Anion gap: 13 (ref 5–15)
BUN: 17 mg/dL (ref 6–20)
CHLORIDE: 105 mmol/L (ref 101–111)
CO2: 22 mmol/L (ref 22–32)
CREATININE: 1.02 mg/dL (ref 0.61–1.24)
Calcium: 8.9 mg/dL (ref 8.9–10.3)
GFR calc Af Amer: >60 mL/min (ref >60)
GFR calc non Af Amer: >60 mL/min (ref >60)
Glucose, Bld: 183 mg/dL — ABNORMAL HIGH (ref 65–99)
Potassium: 3.8 mmol/L (ref 3.5–5.1)
Sodium: 140 mmol/L (ref 135–145)

## 2018-01-21 LAB — CBC
HEMATOCRIT: 39.7 % — AB (ref 40.0–52.0)
HEMOGLOBIN: 13.2 g/dL (ref 13.0–18.0)
MCH: 31.5 pg (ref 26.0–34.0)
MCHC: 33.3 g/dL (ref 32.0–36.0)
MCV: 94.6 fL (ref 80.0–100.0)
Platelets: 155 10*3/uL (ref 150–440)
RBC: 4.19 MIL/uL — ABNORMAL LOW (ref 4.40–5.90)
RDW: 15.4 % — ABNORMAL HIGH (ref 11.5–14.5)
WBC: 7.2 10*3/uL (ref 3.8–10.6)

## 2018-01-21 LAB — GLUCOSE, CAPILLARY: Glucose-Capillary: 152 mg/dL — ABNORMAL HIGH (ref 65–99)

## 2018-01-21 MED ORDER — ATORVASTATIN CALCIUM 20 MG PO TABS
40.0000 mg | ORAL_TABLET | Freq: Every day | ORAL | Status: DC
  Administered 2018-01-22 – 2018-01-26 (×5): 40 mg via ORAL
  Filled 2018-01-21 (×5): qty 2

## 2018-01-21 MED ORDER — BISACODYL 5 MG PO TBEC: 5.0000 mg | DELAYED_RELEASE_TABLET | Freq: Every day | ORAL | Status: DC | PRN

## 2018-01-21 MED ORDER — OXYCODONE HCL 5 MG PO TABS: 5.0000 mg | ORAL_TABLET | ORAL | Status: DC | PRN

## 2018-01-21 MED ORDER — ONDANSETRON HCL 4 MG/2ML IJ SOLN
4.0000 mg | Freq: Once | INTRAMUSCULAR | Status: AC
  Administered 2018-01-21: 4 mg via INTRAVENOUS

## 2018-01-21 MED ORDER — FENTANYL CITRATE (PF) 100 MCG/2ML IJ SOLN
75.0000 ug | Freq: Once | INTRAMUSCULAR | Status: AC
  Administered 2018-01-21: 75 ug via INTRAVENOUS

## 2018-01-21 MED ORDER — DOCUSATE SODIUM 100 MG PO CAPS: 100.0000 mg | ORAL_CAPSULE | Freq: Two times a day (BID) | ORAL | Status: DC

## 2018-01-21 MED ORDER — FENTANYL CITRATE (PF) 100 MCG/2ML IJ SOLN
INTRAMUSCULAR | Status: AC
  Administered 2018-01-21: 75 ug via INTRAVENOUS
  Filled 2018-01-21: qty 2

## 2018-01-21 MED ORDER — METHOCARBAMOL 1000 MG/10ML IJ SOLN
500.0000 mg | Freq: Four times a day (QID) | INTRAVENOUS | Status: DC | PRN
  Filled 2018-01-21: qty 5

## 2018-01-21 MED ORDER — INSULIN REGULAR HUMAN 100 UNIT/ML IJ SOLN: 3.0000 [IU] | Freq: Three times a day (TID) | INTRAMUSCULAR | Status: DC

## 2018-01-21 MED ORDER — HYDROMORPHONE HCL 1 MG/ML IJ SOLN
0.5000 mg | INTRAMUSCULAR | Status: DC | PRN
  Administered 2018-01-22 (×2): 0.5 mg via INTRAVENOUS
  Filled 2018-01-21 (×2): qty 1

## 2018-01-21 MED ORDER — INSULIN REGULAR HUMAN 100 UNIT/ML IJ SOLN: 16.0000 [IU] | Freq: Every day | INTRAMUSCULAR | Status: DC

## 2018-01-21 MED ORDER — SODIUM CHLORIDE 0.9 % IV SOLN
INTRAVENOUS | Status: DC
  Administered 2018-01-22: 75 mL/h via INTRAVENOUS
  Administered 2018-01-22 (×3): via INTRAVENOUS

## 2018-01-21 MED ORDER — ASPIRIN EC 81 MG PO TBEC
81.0000 mg | DELAYED_RELEASE_TABLET | Freq: Every day | ORAL | Status: DC
  Filled 2018-01-21: qty 1

## 2018-01-21 MED ORDER — ONDANSETRON HCL 4 MG/2ML IJ SOLN
INTRAMUSCULAR | Status: AC
  Administered 2018-01-21: 4 mg via INTRAVENOUS
  Filled 2018-01-21: qty 2

## 2018-01-21 MED ORDER — MAGNESIUM CITRATE PO SOLN
1.0000 | Freq: Once | ORAL | Status: DC | PRN
  Filled 2018-01-21: qty 296

## 2018-01-21 MED ORDER — THYROID 60 MG PO TABS
120.0000 mg | ORAL_TABLET | Freq: Every day | ORAL | Status: DC
  Administered 2018-01-23 – 2018-01-26 (×4): 120 mg via ORAL
  Filled 2018-01-21 (×5): qty 2

## 2018-01-21 MED ORDER — METHOCARBAMOL 500 MG PO TABS: 500.0000 mg | ORAL_TABLET | Freq: Four times a day (QID) | ORAL | Status: DC | PRN

## 2018-01-21 MED ORDER — HYDROCHLOROTHIAZIDE 25 MG PO TABS
25.0000 mg | ORAL_TABLET | Freq: Every day | ORAL | Status: DC
  Administered 2018-01-23 – 2018-01-25 (×3): 25 mg via ORAL
  Filled 2018-01-21 (×4): qty 1

## 2018-01-21 MED ORDER — CLINDAMYCIN PHOSPHATE 900 MG/50ML IV SOLN
900.0000 mg | Freq: Once | INTRAVENOUS | Status: AC
  Administered 2018-01-22: 900 mg via INTRAVENOUS
  Filled 2018-01-21: qty 50

## 2018-01-21 MED ORDER — PANTOPRAZOLE SODIUM 40 MG PO TBEC
40.0000 mg | DELAYED_RELEASE_TABLET | Freq: Every day | ORAL | Status: DC
  Administered 2018-01-23 – 2018-01-26 (×5): 40 mg via ORAL
  Filled 2018-01-21 (×6): qty 1

## 2018-01-21 MED ORDER — POLYETHYLENE GLYCOL 3350 17 G PO PACK: 17.0000 g | PACK | Freq: Every day | ORAL | Status: DC | PRN

## 2018-01-21 MED ORDER — BENAZEPRIL HCL 20 MG PO TABS
40.0000 mg | ORAL_TABLET | Freq: Every day | ORAL | Status: DC
  Administered 2018-01-23 – 2018-01-25 (×3): 40 mg via ORAL
  Filled 2018-01-21 (×6): qty 2

## 2018-01-21 NOTE — ED Provider Notes (Signed)
[facility] Emergency Department Provider Note   ____________________________________________   First MD Initiated Contact with Patient 01/21/18 1908     (approximate)  I have reviewed the triage vital signs and the nursing notes.   HISTORY  Chief Complaint Fall    HPI [name] is a 77 y.o. male history of hypertension, high cholesterol low thyroid and diabetes  Patient presents today, he was getting ice cream on the refrigerator when he slipped.  He fell onto his side without striking his head or neck.  He reports he had fairly immediate pain in the lower right leg, it also seems slightly twisted and he thinks he may have broken his lower leg just above the right ankle.  Reports it was not extremely painful, but as he is been transported by EMS the pain is started to increase and is a throbbing pain worse with movement just above the right ankle.  Able to wiggle the toes and feel them without any loss of sensation.  No pain in his hips or the remainder of his extremities.  No abdominal or pelvic pain.  Denies striking his chest back or abdomen.   Pain currently moderate, sharp with movement.  Previous left knee replacement.  Past Medical History:  Diagnosis Date  . Agent orange exposure    "[name]"  . Arthritis    "left knee" (03/18/2014)  . Closed pelvic fracture (HCC) 03/18/2014   "multiple S/P fall w/loss of consciousness; CBG 29"  . Encephalopathy acute 08/16/2013  . GERD (gastroesophageal reflux disease)   . High cholesterol   . Hypertension   . Hypothyroidism   . Type II diabetes mellitus [facility])     Patient Active Problem List   Diagnosis Date Noted  . S/P total knee arthroplasty 12/28/2015  . ARF (acute renal failure) (HCC) 03/21/2014  . CKD (chronic kidney disease) stage 3, GFR 30-59 ml/min (HCC) 03/21/2014  . Syncope 03/19/2014  . Multiple closed fractures of pelvis (HCC) 03/18/2014  . Hypoglycemia 03/18/2014  . Pelvis  fracture (HCC) 03/18/2014  . Acute encephalopathy 08/14/2013  . ANOMALY, POSTERIOR SOFT TISSUE IMPINGEMENT 10/10/2006  . OSTEOMYELITIS, HX OF 10/10/2006  . ONYCHOMYCOSIS 08/17/2006  . DIABETES MELLITUS, TYPE II 08/17/2006  . HYPERLIPIDEMIA 08/17/2006  . CATARACT NOS 08/17/2006  . HYPERTENSION 08/17/2006  . DIVERTICULOSIS, COLON 08/17/2006  . CELLULITIS, LEG, LEFT 08/17/2006  . OSTEOARTHRITIS 08/17/2006  . FRACTURE, ANKLE 08/17/2006  . COLONIC POLYPS, HX OF 08/17/2006  . Diverticulosis of large intestine without perforation or abscess without bleeding 08/17/2006  . Type 2 diabetes mellitus without complications (HCC) 08/17/2006    Past Surgical History:  Procedure Laterality Date  . CARPAL TUNNEL RELEASE Right 1980's  . CARPAL TUNNEL RELEASE Left 05/05/2016   Procedure: CARPAL TUNNEL RELEASE;  Surgeon: Kennedy Bucker, MD;  Location: [facility] ORS;  Service: Orthopedics;  Laterality: Left;  . CATARACT EXTRACTION W/PHACO Left 05/12/2015   Procedure: CATARACT EXTRACTION PHACO AND INTRAOCULAR LENS PLACEMENT (IOC);  Surgeon: Galen Manila, MD;  Location: [facility] ORS;  Service: Ophthalmology;  Laterality: Left;  [place] 01:53.1 AP% 21.4 CDE 24.30 fluid pack lot # [id number]  . EYE SURGERY    . INCISION AND DRAINAGE OF WOUND Left 1988   "staph infection" left leg  . KNEE ARTHROPLASTY Left 12/28/2015   Procedure: COMPUTER ASSISTED TOTAL KNEE ARTHROPLASTY;  Surgeon: Donato Heinz, MD;  Location: [facility] ORS;  Service: Orthopedics;  Laterality: Left;  . ORIF WRIST FRACTURE Left 05/05/2016   Procedure: OPEN REDUCTION  INTERNAL FIXATION (ORIF) WRIST FRACTURE;  Surgeon: Kennedy BuckerMichael Menz, MD;  Location: [facility] ORS;  Service: Orthopedics;  Laterality: Left;    Prior to Admission medications   Medication Sig Start Date End Date Taking? Authorizing Provider  aspirin EC 81 MG tablet Take 81 mg by mouth daily.    [provider]  atorvastatin (LIPITOR) 40 MG tablet Take 40 mg by mouth at bedtime.     [provider]  benazepril (LOTENSIN) 40 MG tablet Take 40 mg by mouth daily.    [provider]  glucosamine-chondroitin 500-400 MG tablet Take 3 tablets by mouth daily.    [provider]  HUMULIN R 100 UNIT/ML injection Inject 0.15-0.17 mLs (15-17 Units total) into the skin 2 (two) times daily before a meal. And per sliding scale Pt is not to get novolin at all 11/28/17   Lyndon CodeKhan, Fozia M, MD  hydrochlorothiazide (HYDRODIURIL) 25 MG tablet Take 25 mg by mouth daily.    [provider]  Insulin Glargine (LANTUS SOLOSTAR) 100 UNIT/ML Solostar Pen Inject 65 Units into the skin daily at 10 pm. 11/28/17   Lyndon CodeKhan, Fozia M, MD  Omega 3 1200 MG CAPS Take 1,200 mg by mouth daily.    [provider]  pantoprazole (PROTONIX) 40 MG tablet TAKE 1 TABLET BY MOUTH DAILY 12/25/17   Lyndon CodeKhan, Fozia M, MD  pyridOXINE (VITAMIN B-6) 100 MG tablet Take 100 mg by mouth daily.    [provider]  thyroid (ARMOUR) 120 MG tablet Take 120 mg by mouth daily before breakfast.    [provider]    Allergies Insulins and Penicillins  Family History  Problem Relation Age of Onset  . Colon cancer Mother   . Cerebral aneurysm Father   . Prostate cancer Neg Hx   . Bladder Cancer Neg Hx   . Kidney cancer Neg Hx     Social History Social History   Tobacco Use  . Smoking status: Former Smoker    Packs/day: 3.00    Years: 30.00    Pack years: 90.00    Types: Cigarettes  . Smokeless tobacco: Never Used  . Tobacco comment: "quit smoking in 1988"  Substance Use Topics  . Alcohol use: No  . Drug use: No    Review of Systems Constitutional: No fever/chills Eyes: No visual changes. ENT: No sore throat. Cardiovascular: Denies chest pain. Respiratory: Denies shortness of breath. Gastrointestinal: No abdominal pain.  No nausea, no vomiting. Genitourinary: Negative for dysuria. Musculoskeletal: Negative for back pain.  No neck pain. Skin: Negative for  rash. Neurological: Negative for headaches, focal weakness or numbness.    ____________________________________________   PHYSICAL EXAM:  VITAL SIGNS: ED Triage Vitals [01/21/18 1858]  Enc Vitals Group     BP      Pulse      Resp      Temp      Temp src      SpO2 95 %     Weight      Height      Head Circumference      Peak Flow      Pain Score      Pain Loc      Pain Edu?      Excl. in GC?     Constitutional: Alert and oriented. Well appearing and in no acute distress.  Very pleasant, likes to converse about classic cars which he restores. Eyes: Conjunctivae are normal. Head: Atraumatic. Nose: No congestion/rhinnorhea. Mouth/Throat: Mucous membranes  are moist. Neck: No stridor.   Cardiovascular: Normal rate, regular rhythm. Grossly normal heart sounds.  Good peripheral circulation. Respiratory: Normal respiratory effort.  No retractions. Lungs CTAB. Gastrointestinal: Soft and nontender. No distention. Musculoskeletal:   RIGHT Right upper extremity demonstrates normal strength, good use of all muscles. No edema bruising or contusions of the right shoulder/upper arm, right elbow, right forearm / hand. Full range of motion of the right right upper extremity without pain. No evidence of trauma. Strong radial pulse. Intact median/ulnar/radial neuro-muscular exam.  LEFT Left upper extremity demonstrates normal strength, good use of all muscles. No edema bruising or contusions of the left shoulder/upper arm, left elbow, left forearm / hand. Full range of motion of the left  upper extremity without pain. No evidence of trauma. Strong radial pulse. Intact median/ulnar/radial neuro-muscular exam.  Lower Extremities  No edema. Normal DP/PT pulses bilateral with good cap refill.  Normal neuro-motor function lower extremities bilateral.  RIGHT Right lower extremity demonstrates normal strength, good use of all muscles from the knee proximal, but notable tenderness over the  distal tib-fib with some anterior bruising and edema.  There are no open lacerations or bleeding.  There is no open fracture.  There is no tenderness across the foot, there is some tenderness across the anterior right ankle joint.  No evidence of trauma to the knee, denies pain across the knee joint anterior or posterior.  No deformity noted.    LEFT Left lower extremity demonstrates normal strength, good use of all muscles. No edema bruising or contusions of the hip,  knee, ankle. Full range of motion of the left lower extremity without pain. No pain on axial loading. No evidence of trauma.   Neurologic:  Normal speech and language. No gross focal neurologic deficits are appreciated.  Skin:  Skin is warm, dry and intact. No rash noted. Psychiatric: Mood and affect are normal. Speech and behavior are normal.  ____________________________________________   LABS (all labs ordered are listed, but only abnormal results are displayed)  Labs Reviewed  CBC - Abnormal; Notable for the following components:      Result Value   RBC 4.19 (*)    HCT 39.7 (*)    RDW 15.4 (*)    All other components within normal limits  BASIC METABOLIC PANEL - Abnormal; Notable for the following components:   Glucose, Bld 183 (*)    All other components within normal limits  GLUCOSE, CAPILLARY - Abnormal; Notable for the following components:   Glucose-Capillary 152 (*)    All other components within normal limits  CBG MONITORING, ED  TYPE AND SCREEN   ____________________________________________  EKG  Reviewed her by me at 2105 Heart rate 90 QRS 100 QTC 460 Normal sinus rhythm, mild prolongation of QT.  No evidence of acute ischemia ____________________________________________  RADIOLOGY  Dg Tibia/fibula Right  Result Date: 01/21/2018 CLINICAL DATA:  Fall in [place].  Right lower leg pain. EXAM: RIGHT TIBIA AND FIBULA - 2 VIEW COMPARISON:  None. FINDINGS: There is a displaced slightly  comminuted oblique fracture of the proximal fibular diaphysis with 1 shaft's with of anterior and lateral displacement of the distal fragment. There is an oblique displaced fracture of the distal tibial diaphysis with 1/2 shaft's with posterolateral displacement of the distal fragment. Mild medial and posterior angulation of the distal fragment. Degenerative changes of the knee. Significant degenerative changes of the mid and hindfoot. Loss of height of the calcaneus which may be acute or chronic. Small vessel  atherosclerotic disease is present. IMPRESSION: Displaced oblique fracture of the distal tibial diaphysis and displaced oblique slightly comminuted fracture of the proximal fibular diaphysis. Loss of height of the calcaneus which may be from acute or chronic fracture. Electronically Signed   By: Elberta Fortis M.D.   On: 01/21/2018 20:42   Dg Ankle Complete Right  Result Date: 01/21/2018 CLINICAL DATA:  Fall in [place]. EXAM: RIGHT ANKLE - COMPLETE 3+ VIEW COMPARISON:  None. FINDINGS: There is a oblique displaced fracture of the distal tibial diaphysis with mild medial and posterior angulation of the distal fragment and 1/2 chest with of displacement. Ankle mortise is within normal. Loss of height of the calcaneus with subtle horizontal lucency over the posterior calcaneus on the lateral film and as these findings may be due to acute or chronic fracture. Degenerative changes of the mid to hindfoot. Small vessel atherosclerotic disease is present. IMPRESSION: Displaced oblique fracture of the distal tibial diaphysis. Loss of height of the calcaneus with subtle horizontal lucency over the posterior calcaneus as findings may be due to an acute or chronic fracture. Electronically Signed   By: Elberta Fortis M.D.   On: 01/21/2018 20:44   Dg Foot 2 Views Right  Result Date: 01/21/2018 CLINICAL DATA:  Dorsal foot pain after fall in kitchen tonight. EXAM: RIGHT FOOT - 2 VIEW COMPARISON:  None. FINDINGS:  Examination demonstrates diffuse osteopenia. There are degenerative changes over the first MTP joint. There are degenerative changes of the interphalangeal joints as well as over the mid to hindfoot region. There is a displaced transverse fracture at the base of the head of the third and fourth metatarsals. There is loss of height of the calcaneus with subtle horizontal lucency over the posterior calcaneus as this may be due to acute or chronic fracture. Small vessel atherosclerotic disease is present. IMPRESSION: Minimally displaced transverse fractures at the base of the head of the third and fourth metatarsals. Loss of height of the calcaneus with subtle horizontal lucency over the superior portion of the calcaneus which may be due to acute or chronic fracture. Degenerative changes as described. Electronically Signed   By: Elberta Fortis M.D.   On: 01/21/2018 20:47    X-rays reviewed, right-sided displaced fractures of the third and fourth metatarsals.  In addition unclear if possible calcaneal fracture.  Patient also has right displaced fractures of the distal tibia, as well as fibular fracture ____________________________________________   PROCEDURES  Procedure(s) performed:   Procedures  Critical Care performed: No  ____________________________________________   INITIAL IMPRESSION / ASSESSMENT AND PLAN / ED COURSE  Pertinent labs & imaging results that were available during my care of the patient were reviewed by me and considered in my medical decision making (see chart for details).  Patient presents for evaluation after a fall.  No proceeding symptoms, fell now having pain in the right foot right lower leg.  X-rays determine fractures present.  Discussed with Dr. Martha Clan, patient will be admitted to orthopedics with medicine consult.  Medicine consult placed with Dr. Anne Hahn.  Patient pain controlled after fentanyl, resting comfortably,   Clinical Course as of Jan 22 2103  Wynelle Link  Jan 21, 2018  1954 Patient resting, x-rays awaiting result.  Reports his pain is improved.   [MQ]    Clinical Course User Index [MQ] Sharyn Creamer, MD   No evidence of acute neurologic or vascular compromise.  There is no skin tenting, the fractures have no evidence of open fracture.  Updated patient, patient  agreeable with admission.  ----------------------------------------- 10:37 PM on 01/21/2018 -----------------------------------------  Orthopedics Dr. Martha Clan has evaluated the patient in the ER at this time.  Dr. Patrecia Pace providing consultation at this time. ____________________________________________   FINAL CLINICAL IMPRESSION(S) / ED DIAGNOSES  Final diagnoses:  Closed fracture of multiple bones of right lower extremity, initial encounter      NEW MEDICATIONS STARTED DURING THIS VISIT:  New Prescriptions   No medications on file     Note:  This document was prepared using Dragon voice recognition software and may include unintentional dictation errors.     Sharyn Creamer, MD 01/21/18 2237

## 2018-01-21 NOTE — Consult Note (Signed)
[facility] - [place] at [facility]   PATIENT NAME: [name]    MR#:  [id number]  DATE OF BIRTH:  1941/09/08  DATE OF ADMISSION:  01/21/2018  PRIMARY CARE PHYSICIAN: Lyndon Code, MD   REQUESTING/REFERRING PHYSICIAN: Real Cons, MD  CHIEF COMPLAINT:   Chief Complaint  Patient presents with  . Fall    HISTORY OF PRESENT ILLNESS:  [name]  is a 77 y.o. male who presents with fall at home and subsequent right tib-fib fracture.  Patient states he was going to get some ice cream when he slipped in the kitchen.  He tried to get up and realized that he could not stand on his right leg.  Imaging in the ED confirms tib-fib fracture.  Orthopedic surgery called and is admitting the patient with plan for operative repair tomorrow.  Hospitalist called in consultation to assist with management of comorbid conditions.  PAST MEDICAL HISTORY:   Past Medical History:  Diagnosis Date  . Agent orange exposure    "[name]"  . Arthritis    "left knee" (03/18/2014)  . Closed pelvic fracture (HCC) 03/18/2014   "multiple S/P fall w/loss of consciousness; CBG 29"  . Encephalopathy acute 08/16/2013  . GERD (gastroesophageal reflux disease)   . High cholesterol   . Hypertension   . Hypothyroidism   . Type II diabetes mellitus (HCC)     PAST SURGICAL HISTOIRY:   Past Surgical History:  Procedure Laterality Date  . CARPAL TUNNEL RELEASE Right 1980's  . CARPAL TUNNEL RELEASE Left 05/05/2016   Procedure: CARPAL TUNNEL RELEASE;  Surgeon: Kennedy Bucker, MD;  Location: [facility] ORS;  Service: Orthopedics;  Laterality: Left;  . CATARACT EXTRACTION W/PHACO Left 05/12/2015   Procedure: CATARACT EXTRACTION PHACO AND INTRAOCULAR LENS PLACEMENT (IOC);  Surgeon: Galen Manila, MD;  Location: [facility] ORS;  Service: Ophthalmology;  Laterality: Left;  [place] 01:53.1 AP% 21.4 CDE 24.30 fluid pack lot # [id number]  . EYE SURGERY    . INCISION AND DRAINAGE OF WOUND Left 1988   "staph  infection" left leg  . KNEE ARTHROPLASTY Left 12/28/2015   Procedure: COMPUTER ASSISTED TOTAL KNEE ARTHROPLASTY;  Surgeon: Donato Heinz, MD;  Location: [facility] ORS;  Service: Orthopedics;  Laterality: Left;  . ORIF WRIST FRACTURE Left 05/05/2016   Procedure: OPEN REDUCTION INTERNAL FIXATION (ORIF) WRIST FRACTURE;  Surgeon: Kennedy Bucker, MD;  Location: [facility] ORS;  Service: Orthopedics;  Laterality: Left;    SOCIAL HISTORY:   Social History   Tobacco Use  . Smoking status: Former Smoker    Packs/day: 3.00    Years: 30.00    Pack years: 90.00    Types: Cigarettes  . Smokeless tobacco: Never Used  . Tobacco comment: "quit smoking in 1988"  Substance Use Topics  . Alcohol use: No    FAMILY HISTORY:   Family History  Problem Relation Age of Onset  . Colon cancer Mother   . Cerebral aneurysm Father   . Prostate cancer Neg Hx   . Bladder Cancer Neg Hx   . Kidney cancer Neg Hx     DRUG ALLERGIES:   Allergies  Allergen Reactions  . Insulins Other (See Comments)    Pt states that no insulin works for him, but Lantus and Humulin R.    . Penicillins Rash and Other (See Comments)    Has patient had a PCN reaction causing immediate rash, facial/tongue/throat swelling, SOB or lightheadedness with hypotension: No Has patient had a PCN reaction causing severe rash  involving mucus membranes or skin necrosis: No Has patient had a PCN reaction that required hospitalization No Has patient had a PCN reaction occurring within the last 10 years: No If all of the above answers are "NO", then may proceed with Cephalosporin use.    REVIEW OF SYSTEMS:  Review of Systems  Constitutional: Negative for chills, fever, malaise/fatigue and weight loss.  HENT: Negative for ear pain, hearing loss and tinnitus.   Eyes: Negative for blurred vision, double vision, pain and redness.  Respiratory: Negative for cough, hemoptysis and shortness of breath.   Cardiovascular: Negative for chest pain, palpitations,  orthopnea and leg swelling.  Gastrointestinal: Negative for abdominal pain, constipation, diarrhea, nausea and vomiting.  Genitourinary: Negative for dysuria, frequency and hematuria.  Musculoskeletal: Positive for falls. Negative for back pain, joint pain and neck pain.  Skin:       No acne, rash, or lesions  Neurological: Negative for dizziness, tremors, focal weakness and weakness.  Endo/Heme/Allergies: Negative for polydipsia. Does not bruise/bleed easily.  Psychiatric/Behavioral: Negative for depression. The patient is not nervous/anxious and does not have insomnia.     MEDICATIONS AT HOME:   Prior to Admission medications   Medication Sig Start Date End Date Taking? Authorizing Provider  aspirin EC 81 MG tablet Take 81 mg by mouth daily.    [provider]  atorvastatin (LIPITOR) 40 MG tablet Take 40 mg by mouth at bedtime.     [provider]  benazepril (LOTENSIN) 40 MG tablet Take 40 mg by mouth daily.    [provider]  glucosamine-chondroitin 500-400 MG tablet Take 3 tablets by mouth daily.    [provider]  HUMULIN R 100 UNIT/ML injection Inject 0.15-0.17 mLs (15-17 Units total) into the skin 2 (two) times daily before a meal. And per sliding scale Pt is not to get novolin at all 11/28/17   Lyndon Code, MD  hydrochlorothiazide (HYDRODIURIL) 25 MG tablet Take 25 mg by mouth daily.    [provider]  Insulin Glargine (LANTUS SOLOSTAR) 100 UNIT/ML Solostar Pen Inject 65 Units into the skin daily at 10 pm. 11/28/17   Lyndon Code, MD  Omega 3 1200 MG CAPS Take 1,200 mg by mouth daily.    [provider]  pantoprazole (PROTONIX) 40 MG tablet TAKE 1 TABLET BY MOUTH DAILY 12/25/17   Lyndon Code, MD  pyridOXINE (VITAMIN B-6) 100 MG tablet Take 100 mg by mouth daily.    [provider]  thyroid (ARMOUR) 120 MG tablet Take 120 mg by mouth daily before breakfast.    [provider]      VITAL SIGNS:    Vitals:   01/21/18 1900 01/21/18 1922 01/21/18 1930 01/21/18 2030  BP: (!) 155/80 140/78 124/71 126/73  Pulse: (!) 102 95 97 81  Resp:  16    Temp:  98 F (36.7 C)    TempSrc:  Oral    SpO2: 94% 94% 91% 94%  Weight:  106.6 kg (235 lb)    Height:  5\' 11"  (1.803 m)     Wt Readings from Last 3 Encounters:  01/21/18 106.6 kg (235 lb)  01/02/18 107.7 kg (237 lb 6.4 oz)  11/28/17 108.5 kg (239 lb 3.2 oz)    PHYSICAL EXAMINATION:  Physical Exam  Vitals reviewed. Constitutional: He is oriented to person, place, and time. He appears well-developed and well-nourished. No distress.  HENT:  Head: Normocephalic and atraumatic.  Mouth/Throat: Oropharynx is clear and moist.  Eyes:  Pupils are equal, round, and reactive to light. Conjunctivae and EOM are normal. No scleral icterus.  Neck: Normal range of motion. Neck supple. No JVD present. No thyromegaly present.  Cardiovascular: Normal rate, regular rhythm and intact distal pulses. Exam reveals no gallop and no friction rub.  No murmur heard. Respiratory: Effort normal and breath sounds normal. No respiratory distress. He has no wheezes. He has no rales.  GI: Soft. Bowel sounds are normal. He exhibits no distension. There is no tenderness.  Musculoskeletal: Normal range of motion. He exhibits no edema.  Right lower extremity splinted  Lymphadenopathy:    He has no cervical adenopathy.  Neurological: He is alert and oriented to person, place, and time. No cranial nerve deficit.  No dysarthria, no aphasia  Skin: Skin is warm and dry. No rash noted. No erythema.  Psychiatric: He has a normal mood and affect. His behavior is normal. Judgment and thought content normal.     LABORATORY PANEL:   CBC Recent Labs  Lab 01/21/18 1921  WBC 7.2  HGB 13.2  HCT 39.7*  PLT 155   ------------------------------------------------------------------------------------------------------------------  Chemistries  Recent Labs  Lab 01/21/18 1921   NA 140  K 3.8  CL 105  CO2 22  GLUCOSE 183*  BUN 17  CREATININE 1.02  CALCIUM 8.9   ------------------------------------------------------------------------------------------------------------------  Cardiac Enzymes No results for input(s): TROPONINI in the last 168 hours. ------------------------------------------------------------------------------------------------------------------  RADIOLOGY:  Dg Tibia/fibula Right  Result Date: 01/21/2018 CLINICAL DATA:  Fall in [place] tonight.  Right lower leg pain. EXAM: RIGHT TIBIA AND FIBULA - 2 VIEW COMPARISON:  None. FINDINGS: There is a displaced slightly comminuted oblique fracture of the proximal fibular diaphysis with 1 shaft's with of anterior and lateral displacement of the distal fragment. There is an oblique displaced fracture of the distal tibial diaphysis with 1/2 shaft's with posterolateral displacement of the distal fragment. Mild medial and posterior angulation of the distal fragment. Degenerative changes of the knee. Significant degenerative changes of the mid and hindfoot. Loss of height of the calcaneus which may be acute or chronic. Small vessel atherosclerotic disease is present. IMPRESSION: Displaced oblique fracture of the distal tibial diaphysis and displaced oblique slightly comminuted fracture of the proximal fibular diaphysis. Loss of height of the calcaneus which may be from acute or chronic fracture. Electronically Signed   By: Elberta Fortisaniel  Boyle M.D.   On: 01/21/2018 20:42   Dg Ankle Complete Right  Result Date: 01/21/2018 CLINICAL DATA:  Fall in [place] tonight. EXAM: RIGHT ANKLE - COMPLETE 3+ VIEW COMPARISON:  None. FINDINGS: There is a oblique displaced fracture of the distal tibial diaphysis with mild medial and posterior angulation of the distal fragment and 1/2 chest with of displacement. Ankle mortise is within normal. Loss of height of the calcaneus with subtle horizontal lucency over the posterior calcaneus on the  lateral film and as these findings may be due to acute or chronic fracture. Degenerative changes of the mid to hindfoot. Small vessel atherosclerotic disease is present. IMPRESSION: Displaced oblique fracture of the distal tibial diaphysis. Loss of height of the calcaneus with subtle horizontal lucency over the posterior calcaneus as findings may be due to an acute or chronic fracture. Electronically Signed   By: Elberta Fortisaniel  Boyle M.D.   On: 01/21/2018 20:44   Dg Foot 2 Views Right  Result Date: 01/21/2018 CLINICAL DATA:  Dorsal foot pain after fall in kitchen tonight. EXAM: RIGHT FOOT - 2 VIEW COMPARISON:  None. FINDINGS: Examination demonstrates diffuse osteopenia. There  are degenerative changes over the first MTP joint. There are degenerative changes of the interphalangeal joints as well as over the mid to hindfoot region. There is a displaced transverse fracture at the base of the head of the third and fourth metatarsals. There is loss of height of the calcaneus with subtle horizontal lucency over the posterior calcaneus as this may be due to acute or chronic fracture. Small vessel atherosclerotic disease is present. IMPRESSION: Minimally displaced transverse fractures at the base of the head of the third and fourth metatarsals. Loss of height of the calcaneus with subtle horizontal lucency over the superior portion of the calcaneus which may be due to acute or chronic fracture. Degenerative changes as described. Electronically Signed   By: Elberta Fortis M.D.   On: 01/21/2018 20:47    EKG:   Orders placed or performed during the hospital encounter of 01/21/18  . ED EKG  . ED EKG  . EKG 12-Lead  . EKG 12-Lead    IMPRESSION AND PLAN:  Principal Problem:   Closed fracture of right tibia and fibula -defer to primary orthopedic team for management of this condition Active Problems:   Type 2 diabetes mellitus (HCC) -patient states that he can only take Humulin R insulin and Lantus long-acting insulin.   He states that other insulins including Novolin and NovoLog simply do not work for him.  We will order Humulin R insulin sliding scale based on something similar to what he takes at home.  His wife will bring in this exact sliding scale tomorrow and the order can be modified.  We will hold his Lantus tonight as he is n.p.o., this can be restarted once he is taking p.o. post surgery   Essential hypertension -continue home meds   HLD (hyperlipidemia) -home dose antilipid   Hypothyroidism -home dose thyroid replacement, patient states that he must have brand name thyroid medication   GERD (gastroesophageal reflux disease) -home dose PPI  All the records are reviewed and case discussed with ED provider. Management plans discussed with the patient and/or family.  CODE STATUS: Full Code Status History    Date Active Date Inactive Code Status Order ID Comments User Context   05/23/2017 2028 05/24/2017 1809 Full Code [id number]  Alford Highland, MD ED   05/05/2016 1553 05/05/2016 1855 Full Code [id number]  Kennedy Bucker, MD Inpatient   03/18/2014 2250 03/21/2014 1940 Full Code [id number]  Eduard Clos, MD Inpatient   08/14/2013 1539 08/20/2013 1741 Full Code [id number]  Lorretta Harp, MD Inpatient      TOTAL TIME TAKING CARE OF THIS PATIENT: 40 minutes.    Anne Hahn, Velton Roselle FIELDING 01/21/2018, 9:40 PM  [facility]  Office  [phone]  CC: Primary care Physician: Lyndon Code, MD  Note:  This document was prepared using Dragon voice recognition software and may include unintentional dictation errors.

## 2018-01-21 NOTE — ED Triage Notes (Signed)
Patient from home [facility]. Reports he fell in the kitchen this evening. Denies hitting head. Denies loss of consciousness. Obvious deformity noted to right lower leg. Good pedal pulses noted.

## 2018-01-21 NOTE — H&P (Signed)
PREOPERATIVE H&P  Chief Complaint: Right leg pain s/p fall  HPI: [name] is a 77 y.o. male who presents with a diagnosis of right tibia and fibula fracture with 3rd and 4th metatarsal fractures to the right foot.  Patient explains that he slipped in the kitchen at home sustaining the above-noted injuries. He was unable to weight-bear following his fall. He is brought to the [facility] emergency Department where x-rays were taken and the above-noted fractures were confirmed.  Patient is seen in the ER with his wife at the bedside. His right leg is splinted. He denies any numbness or tingling. Patient denies other injuries other than his lower extremity injuries.  Past Medical History:  Diagnosis Date  . Agent orange exposure    "[name]"  . Arthritis    "left knee" (03/18/2014)  . Closed pelvic fracture (HCC) 03/18/2014   "multiple S/P fall w/loss of consciousness; CBG 29"  . Encephalopathy acute 08/16/2013  . GERD (gastroesophageal reflux disease)   . High cholesterol   . Hypertension   . Hypothyroidism   . Type II diabetes mellitus (HCC)    Past Surgical History:  Procedure Laterality Date  . CARPAL TUNNEL RELEASE Right 1980's  . CARPAL TUNNEL RELEASE Left 05/05/2016   Procedure: CARPAL TUNNEL RELEASE;  Surgeon: Kennedy Bucker, MD;  Location: [facility] ORS;  Service: Orthopedics;  Laterality: Left;  . CATARACT EXTRACTION W/PHACO Left 05/12/2015   Procedure: CATARACT EXTRACTION PHACO AND INTRAOCULAR LENS PLACEMENT (IOC);  Surgeon: Galen Manila, MD;  Location: [facility] ORS;  Service: Ophthalmology;  Laterality: Left;  [place] 01:53.1 AP% 21.4 CDE 24.30 fluid pack lot # [id number]  . EYE SURGERY    . INCISION AND DRAINAGE OF WOUND Left 1988   "staph infection" left leg  . KNEE ARTHROPLASTY Left 12/28/2015   Procedure: COMPUTER ASSISTED TOTAL KNEE ARTHROPLASTY;  Surgeon: Donato Heinz, MD;  Location: [facility] ORS;  Service: Orthopedics;  Laterality: Left;  . ORIF WRIST FRACTURE Left  05/05/2016   Procedure: OPEN REDUCTION INTERNAL FIXATION (ORIF) WRIST FRACTURE;  Surgeon: Kennedy Bucker, MD;  Location: [facility] ORS;  Service: Orthopedics;  Laterality: Left;   Social History   Socioeconomic History  . Marital status: Married    Spouse name: Not on file  . Number of children: Not on file  . Years of education: Not on file  . Highest education level: Not on file  Occupational History  . Not on file  Social Needs  . Financial resource strain: Not on file  . Food insecurity:    Worry: Not on file    Inability: Not on file  . Transportation needs:    Medical: Not on file    Non-medical: Not on file  Tobacco Use  . Smoking status: Former Smoker    Packs/day: 3.00    Years: 30.00    Pack years: 90.00    Types: Cigarettes  . Smokeless tobacco: Never Used  . Tobacco comment: "quit smoking in 1988"  Substance and Sexual Activity  . Alcohol use: No  . Drug use: No  . Sexual activity: Never  Lifestyle  . Physical activity:    Days per week: Not on file    Minutes per session: Not on file  . Stress: Not on file  Relationships  . Social connections:    Talks on phone: Not on file    Gets together: Not on file    Attends religious service: Not on file    Active member of club or  organization: Not on file    Attends meetings of clubs or organizations: Not on file    Relationship status: Not on file  Other Topics Concern  . Not on file  Social History Narrative  . Not on file   Family History  Problem Relation Age of Onset  . Colon cancer Mother   . Cerebral aneurysm Father   . Prostate cancer Neg Hx   . Bladder Cancer Neg Hx   . Kidney cancer Neg Hx    Allergies  Allergen Reactions  . Insulins Other (See Comments)    Pt states that no insulin works for him, but Lantus and Humulin R.    . Penicillins Rash and Other (See Comments)    Has patient had a PCN reaction causing immediate rash, facial/tongue/throat swelling, SOB or lightheadedness with hypotension:  No Has patient had a PCN reaction causing severe rash involving mucus membranes or skin necrosis: No Has patient had a PCN reaction that required hospitalization No Has patient had a PCN reaction occurring within the last 10 years: No If all of the above answers are "NO", then may proceed with Cephalosporin use.   Prior to Admission medications   Medication Sig Start Date End Date Taking? Authorizing Provider  aspirin EC 81 MG tablet Take 81 mg by mouth daily.   Yes [provider]  atorvastatin (LIPITOR) 40 MG tablet Take 40 mg by mouth daily at 6 PM.    Yes [provider]  benazepril (LOTENSIN) 40 MG tablet Take 40 mg by mouth daily.   Yes [provider]  Cholecalciferol 1000 units capsule Take 1,000 Units by mouth daily.   Yes [provider]  HUMULIN R 100 UNIT/ML injection Inject 0.15-0.17 mLs (15-17 Units total) into the skin 2 (two) times daily before a meal. And per sliding scale Pt is not to get novolin at all Patient taking differently: Inject 15-17 Units into the skin 2 (two) times daily before a meal. 16 units in the morning and per sliding scale Pt is not to get novolin at all 11/28/17  Yes Lyndon CodeKhan, Fozia M, MD  hydrochlorothiazide (HYDRODIURIL) 25 MG tablet Take 25 mg by mouth daily.   Yes [provider]  Insulin Glargine (LANTUS SOLOSTAR) 100 UNIT/ML Solostar Pen Inject 65 Units into the skin daily at 10 pm. Patient taking differently: Inject 56 Units into the skin daily.  11/28/17  Yes Lyndon CodeKhan, Fozia M, MD  [place] Ginseng 1000 MG TABS Take 1,000 mg by mouth daily.   Yes [provider]  Omega 3 1200 MG CAPS Take 1,200 mg by mouth daily.   Yes [provider]  pantoprazole (PROTONIX) 40 MG tablet TAKE 1 TABLET BY MOUTH DAILY 12/25/17  Yes Lyndon CodeKhan, Fozia M, MD  pyridOXINE (VITAMIN B-6) 100 MG tablet Take 100 mg by mouth daily.   Yes [provider]  thyroid (ARMOUR) 120 MG tablet Take 120 mg by mouth daily before  breakfast.   Yes [provider]     Positive ROS: All other systems have been reviewed and were otherwise negative with the exception of those mentioned in the HPI and as above.  Physical Exam: General: Alert, no acute distress Cardiovascular: Regular rate and rhythm, no murmurs rubs or gallops.  No pedal edema Respiratory: Clear to auscultation bilaterally, no wheezes rales or rhonchi. No cyanosis, no use of accessory musculature GI: No organomegaly, abdomen is soft and non-tender nondistended with positive bowel sounds. Skin: Skin intact, no lesions within the  operative field. Neurologic: Sensation intact distally Psychiatric: Patient is competent for consent with normal mood and affect Lymphatic: No cervical lymphadenopathy  MUSCULOSKELETAL: Right lower extremity: Patient's skin is intact. There is ecchymosis over the anterior tibia at the junction of the middle and distal one thirds. His leg compartments and foot compartments are soft and compressible. He can flex and extend his toes without pain. Patient's foot is well perfused and he has palpable pedal pulses. He had tenderness over the distal tibia, proximal fibula laterally and over the right foot.  Assessment: Right tibia and fibula fracture Right third and fourth metatarsal neck fractures  Plan: I splinted the patient and his wife that he has a fracture of the tibia and fibula. This will require intramedullary fixation of the tibia. The fibula fracture will be treated nonoperatively. I have also contacted Dr. Orland Jarred from podiatry who will review his x-rays and help me determine whether intervention is necessary for his metatarsal fractures. If surgery is required Dr. Orland Jarred would perform the procedure.  I discussed the details of intramedullary fixation surgery with the patient is wife along with the postoperative course.  I discussed the risks and benefits of surgery. The risks include but are not limited to  infection, bleeding requiring blood transfusion, nerve or blood vessel injury, joint stiffness or loss of motion, persistent pain, weakness or instability, malunion, nonunion and hardware failure and the need for further surgery. Medical risks include but are not limited to DVT and pulmonary embolism, myocardial infarction, stroke, pneumonia, respiratory failure and death. Patient understood these risks and wished to proceed.   I have requested a hospitalist consult for preoperative clearance for this patient.  Patient will be nothing by mouth after midnight. Surgery is planned for tomorrow pending medical clearance. I have reviewed all the patient's laboratory studies and x-rays in preparation for this case.    Juanell Fairly, MD   01/21/2018 10:58 PM

## 2018-01-21 NOTE — ED Notes (Signed)
Patient given graham crackers and peanut butter and water. No further needs expressed at this time.

## 2018-01-22 ENCOUNTER — Encounter: Payer: Self-pay | Admitting: *Deleted

## 2018-01-22 ENCOUNTER — Inpatient Hospital Stay: Payer: Medicare HMO | Admitting: Anesthesiology

## 2018-01-22 ENCOUNTER — Inpatient Hospital Stay: Payer: Medicare HMO

## 2018-01-22 ENCOUNTER — Encounter
Admission: EM | Disposition: A | Discharge status: Skilled Nursing Facility | Payer: Self-pay | Source: Home / Self Care | Attending: Orthopedic Surgery

## 2018-01-22 HISTORY — PX: INTRAMEDULLARY (IM) NAIL INTERTROCHANTERIC: SHX5875

## 2018-01-22 LAB — GLUCOSE, CAPILLARY
GLUCOSE-CAPILLARY: 176 mg/dL — AB (ref 65–99)
GLUCOSE-CAPILLARY: 190 mg/dL — AB (ref 65–99)
GLUCOSE-CAPILLARY: 203 mg/dL — AB (ref 65–99)
GLUCOSE-CAPILLARY: 196 mg/dL — AB (ref 65–99)
GLUCOSE-CAPILLARY: 178 mg/dL — AB (ref 65–99)
GLUCOSE-CAPILLARY: 384 mg/dL — AB (ref 65–99)
GLUCOSE-CAPILLARY: 407 mg/dL — AB (ref 65–99)
Glucose-Capillary: 201 mg/dL — ABNORMAL HIGH (ref 65–99)

## 2018-01-22 LAB — APTT: aPTT: 31 seconds (ref 24–36)

## 2018-01-22 LAB — SURGICAL PCR SCREEN
MRSA, PCR: NEGATIVE
Staphylococcus aureus: NEGATIVE

## 2018-01-22 LAB — PROTIME-INR
INR: 0.98
Prothrombin Time: 12.9 seconds (ref 11.4–15.2)

## 2018-01-22 SURGERY — FIXATION, FRACTURE, INTERTROCHANTERIC, WITH INTRAMEDULLARY ROD
Anesthesia: Spinal | Site: Leg Lower | Laterality: Right | Wound class: Clean

## 2018-01-22 MED ORDER — TRAMADOL HCL 50 MG PO TABS
50.0000 mg | ORAL_TABLET | Freq: Four times a day (QID) | ORAL | Status: DC
  Administered 2018-01-22 – 2018-01-26 (×14): 50 mg via ORAL
  Filled 2018-01-22 (×15): qty 1

## 2018-01-22 MED ORDER — INSULIN REGULAR HUMAN 100 UNIT/ML IJ SOLN
3.0000 [IU] | Freq: Three times a day (TID) | INTRAMUSCULAR | Status: DC
  Filled 2018-01-22: qty 0.03

## 2018-01-22 MED ORDER — ONDANSETRON HCL 4 MG PO TABS: 4.0000 mg | ORAL_TABLET | Freq: Four times a day (QID) | ORAL | Status: DC | PRN

## 2018-01-22 MED ORDER — POTASSIUM CHLORIDE IN NACL 20-0.9 MEQ/L-% IV SOLN
INTRAVENOUS | Status: DC
  Administered 2018-01-22: 18:00:00 via INTRAVENOUS
  Filled 2018-01-22 (×9): qty 1000

## 2018-01-22 MED ORDER — PHENYLEPHRINE HCL 10 MG/ML IJ SOLN
INTRAMUSCULAR | Status: AC
  Filled 2018-01-22: qty 1

## 2018-01-22 MED ORDER — POLYETHYLENE GLYCOL 3350 17 G PO PACK
17.0000 g | PACK | Freq: Every day | ORAL | Status: DC | PRN
  Filled 2018-01-22: qty 1

## 2018-01-22 MED ORDER — PHENYLEPHRINE HCL 10 MG/ML IJ SOLN
INTRAMUSCULAR | Status: DC | PRN
  Administered 2018-01-22 (×4): 100 ug via INTRAVENOUS

## 2018-01-22 MED ORDER — LIDOCAINE HCL (PF) 2 % IJ SOLN
INTRAMUSCULAR | Status: AC
  Filled 2018-01-22: qty 10

## 2018-01-22 MED ORDER — FENTANYL CITRATE (PF) 100 MCG/2ML IJ SOLN
INTRAMUSCULAR | Status: AC
  Filled 2018-01-22: qty 2

## 2018-01-22 MED ORDER — INSULIN REGULAR HUMAN 100 UNIT/ML IJ SOLN
2.0000 [IU] | Freq: Once | INTRAMUSCULAR | Status: AC
  Administered 2018-01-22: 2 [IU] via SUBCUTANEOUS

## 2018-01-22 MED ORDER — HYDROMORPHONE HCL 1 MG/ML IJ SOLN: 0.5000 mg | INTRAMUSCULAR | Status: DC | PRN

## 2018-01-22 MED ORDER — DIPHENHYDRAMINE HCL 25 MG PO CAPS
25.0000 mg | ORAL_CAPSULE | Freq: Every evening | ORAL | Status: DC | PRN
  Administered 2018-01-23 – 2018-01-25 (×2): 25 mg via ORAL
  Filled 2018-01-22 (×2): qty 1

## 2018-01-22 MED ORDER — METHOCARBAMOL 1000 MG/10ML IJ SOLN
500.0000 mg | Freq: Four times a day (QID) | INTRAVENOUS | Status: DC | PRN
  Filled 2018-01-22: qty 5

## 2018-01-22 MED ORDER — CLINDAMYCIN PHOSPHATE 600 MG/50ML IV SOLN
600.0000 mg | Freq: Once | INTRAVENOUS | Status: AC
  Administered 2018-01-22: 600 mg via INTRAVENOUS

## 2018-01-22 MED ORDER — ACETAMINOPHEN 500 MG PO TABS
1000.0000 mg | ORAL_TABLET | Freq: Four times a day (QID) | ORAL | Status: AC
  Administered 2018-01-22 – 2018-01-23 (×4): 1000 mg via ORAL
  Filled 2018-01-22 (×4): qty 2

## 2018-01-22 MED ORDER — SODIUM CHLORIDE 0.9 % IV SOLN
INTRAVENOUS | Status: DC | PRN
  Administered 2018-01-22: 20 ug/min via INTRAVENOUS

## 2018-01-22 MED ORDER — INSULIN REGULAR HUMAN 100 UNIT/ML IJ SOLN: 0.0000 [IU] | Freq: Three times a day (TID) | INTRAMUSCULAR | Status: DC

## 2018-01-22 MED ORDER — INSULIN REGULAR HUMAN 100 UNIT/ML IJ SOLN
0.0000 [IU] | Freq: Three times a day (TID) | INTRAMUSCULAR | Status: DC
  Administered 2018-01-22: 20 [IU] via SUBCUTANEOUS
  Administered 2018-01-23: 8 [IU] via SUBCUTANEOUS
  Administered 2018-01-23: 6 [IU] via SUBCUTANEOUS
  Administered 2018-01-23 – 2018-01-24 (×3): 10 [IU] via SUBCUTANEOUS
  Administered 2018-01-24: 4 [IU] via SUBCUTANEOUS
  Administered 2018-01-24: 15 [IU] via SUBCUTANEOUS
  Administered 2018-01-24: 10 [IU] via SUBCUTANEOUS
  Administered 2018-01-25: 20 [IU] via SUBCUTANEOUS
  Administered 2018-01-25: 6 [IU] via SUBCUTANEOUS
  Administered 2018-01-25: 15 [IU] via SUBCUTANEOUS
  Administered 2018-01-26: 10 [IU] via SUBCUTANEOUS
  Administered 2018-01-26: 15 [IU] via SUBCUTANEOUS
  Administered 2018-01-26: 8 [IU] via SUBCUTANEOUS
  Administered 2018-01-26: 16 [IU] via SUBCUTANEOUS
  Filled 2018-01-22 (×23): qty 0.22

## 2018-01-22 MED ORDER — FENTANYL CITRATE (PF) 100 MCG/2ML IJ SOLN
INTRAMUSCULAR | Status: DC | PRN
  Administered 2018-01-22: 50 ug via INTRAVENOUS

## 2018-01-22 MED ORDER — CLINDAMYCIN PHOSPHATE 600 MG/50ML IV SOLN
600.0000 mg | Freq: Four times a day (QID) | INTRAVENOUS | Status: AC
  Administered 2018-01-22 – 2018-01-23 (×2): 600 mg via INTRAVENOUS
  Filled 2018-01-22 (×3): qty 50

## 2018-01-22 MED ORDER — ALUM & MAG HYDROXIDE-SIMETH 200-200-20 MG/5ML PO SUSP: 30.0000 mL | ORAL | Status: DC | PRN

## 2018-01-22 MED ORDER — PROPOFOL 500 MG/50ML IV EMUL
INTRAVENOUS | Status: DC | PRN
  Administered 2018-01-22: 50 ug via INTRAVENOUS
  Administered 2018-01-22: 75 ug via INTRAVENOUS
  Administered 2018-01-22: 100 ug via INTRAVENOUS

## 2018-01-22 MED ORDER — PROPOFOL 500 MG/50ML IV EMUL
INTRAVENOUS | Status: AC
  Filled 2018-01-22: qty 50

## 2018-01-22 MED ORDER — CLINDAMYCIN PHOSPHATE 600 MG/50ML IV SOLN
INTRAVENOUS | Status: AC
  Administered 2018-01-22: 600 mg
  Filled 2018-01-22: qty 50

## 2018-01-22 MED ORDER — INSULIN REGULAR HUMAN 100 UNIT/ML IJ SOLN
0.0000 [IU] | Freq: Three times a day (TID) | INTRAMUSCULAR | Status: DC
  Administered 2018-01-22: 3 [IU] via SUBCUTANEOUS
  Filled 2018-01-22: qty 0.15

## 2018-01-22 MED ORDER — FENTANYL CITRATE (PF) 100 MCG/2ML IJ SOLN: 25.0000 ug | INTRAMUSCULAR | Status: DC | PRN

## 2018-01-22 MED ORDER — PROPOFOL 500 MG/50ML IV EMUL
INTRAVENOUS | Status: DC | PRN
  Administered 2018-01-22: 60 ug/kg/min via INTRAVENOUS

## 2018-01-22 MED ORDER — INSULIN REGULAR HUMAN 100 UNIT/ML IJ SOLN: 16.0000 [IU] | Freq: Every day | INTRAMUSCULAR | Status: DC

## 2018-01-22 MED ORDER — INSULIN GLARGINE 100 UNIT/ML ~~LOC~~ SOLN
20.0000 [IU] | Freq: Every day | SUBCUTANEOUS | Status: DC
  Filled 2018-01-22 (×3): qty 0.2

## 2018-01-22 MED ORDER — INSULIN REGULAR HUMAN 100 UNIT/ML IJ SOLN
0.0000 [IU] | Freq: Three times a day (TID) | INTRAMUSCULAR | Status: DC
  Filled 2018-01-22 (×3): qty 0.15

## 2018-01-22 MED ORDER — OXYCODONE HCL 5 MG PO TABS: 10.0000 mg | ORAL_TABLET | ORAL | Status: DC | PRN

## 2018-01-22 MED ORDER — ENOXAPARIN SODIUM 40 MG/0.4ML ~~LOC~~ SOLN
40.0000 mg | SUBCUTANEOUS | Status: DC
Start: 2018-01-23 — End: 2018-01-26
  Administered 2018-01-23 – 2018-01-26 (×4): 40 mg via SUBCUTANEOUS
  Filled 2018-01-22 (×4): qty 0.4

## 2018-01-22 MED ORDER — BISACODYL 10 MG RE SUPP
10.0000 mg | Freq: Every day | RECTAL | Status: DC | PRN
  Administered 2018-01-24: 10 mg via RECTAL
  Filled 2018-01-22: qty 1

## 2018-01-22 MED ORDER — ONDANSETRON HCL 4 MG/2ML IJ SOLN: 4.0000 mg | Freq: Four times a day (QID) | INTRAMUSCULAR | Status: DC | PRN

## 2018-01-22 MED ORDER — NEOMYCIN-POLYMYXIN B GU 40-200000 IR SOLN
Status: DC | PRN
  Administered 2018-01-22: 4 mL

## 2018-01-22 MED ORDER — ONDANSETRON HCL 4 MG/2ML IJ SOLN: 4.0000 mg | Freq: Once | INTRAMUSCULAR | Status: DC | PRN

## 2018-01-22 MED ORDER — OXYCODONE HCL 5 MG PO TABS
5.0000 mg | ORAL_TABLET | ORAL | Status: DC | PRN
Start: 2018-01-22 — End: 2018-01-26

## 2018-01-22 MED ORDER — DOCUSATE SODIUM 100 MG PO CAPS
100.0000 mg | ORAL_CAPSULE | Freq: Two times a day (BID) | ORAL | Status: DC
  Administered 2018-01-22 – 2018-01-26 (×8): 100 mg via ORAL
  Filled 2018-01-22 (×9): qty 1

## 2018-01-22 MED ORDER — MAGNESIUM CITRATE PO SOLN
1.0000 | Freq: Once | ORAL | Status: DC | PRN
  Filled 2018-01-22: qty 296

## 2018-01-22 MED ORDER — METHOCARBAMOL 500 MG PO TABS
500.0000 mg | ORAL_TABLET | Freq: Four times a day (QID) | ORAL | Status: DC | PRN
  Administered 2018-01-24: 500 mg via ORAL
  Filled 2018-01-22: qty 1

## 2018-01-22 MED ORDER — INSULIN GLARGINE 100 UNIT/ML ~~LOC~~ SOLN
25.0000 [IU] | Freq: Every day | SUBCUTANEOUS | Status: DC
  Administered 2018-01-22: 25 [IU] via SUBCUTANEOUS
  Filled 2018-01-22 (×2): qty 0.25

## 2018-01-22 MED ORDER — INSULIN GLARGINE 100 UNIT/ML ~~LOC~~ SOLN
25.0000 [IU] | Freq: Every day | SUBCUTANEOUS | Status: DC
  Filled 2018-01-22: qty 0.25

## 2018-01-22 SURGICAL SUPPLY — 51 items
BANDAGE ELASTIC 4 LF NS (GAUZE/BANDAGES/DRESSINGS) ×2 IMPLANT
BANDAGE ELASTIC 6 LF NS (GAUZE/BANDAGES/DRESSINGS) ×2 IMPLANT
BIT DRILL 3.8X6 NS (BIT) ×2 IMPLANT
BIT DRILL 4.4 NS (BIT) ×2 IMPLANT
BIT DRILL CAL 3.2 LONG (BIT) ×2 IMPLANT
CANISTER SUCT 1200ML W/VALVE (MISCELLANEOUS) ×2 IMPLANT
CUFF TOURN 24 STER (MISCELLANEOUS) IMPLANT
CUFF TOURN 30 STER DUAL PORT (MISCELLANEOUS) IMPLANT
DRAPE C-ARM XRAY 36X54 (DRAPES) ×2 IMPLANT
DRAPE C-ARMOR (DRAPES) ×2 IMPLANT
DRAPE SHEET LG 3/4 BI-LAMINATE (DRAPES) ×4 IMPLANT
DURAPREP 26ML APPLICATOR (WOUND CARE) ×4 IMPLANT
ELECT REM PT RETURN 9FT ADLT (ELECTROSURGICAL) ×2
ELECTRODE REM PT RTRN 9FT ADLT (ELECTROSURGICAL) ×1 IMPLANT
EVACUATOR 1/8 PVC DRAIN (DRAIN) ×2 IMPLANT
GAUZE PETRO XEROFOAM 1X8 (MISCELLANEOUS) ×2 IMPLANT
GAUZE SPONGE 4X4 12PLY STRL (GAUZE/BANDAGES/DRESSINGS) ×2 IMPLANT
GLOVE BIOGEL PI IND STRL 9 (GLOVE) ×1 IMPLANT
GLOVE BIOGEL PI INDICATOR 9 (GLOVE) ×1
GLOVE SURG 9.0 ORTHO LTXF (GLOVE) ×4 IMPLANT
GOWN STRL REUS TWL 2XL XL LVL4 (GOWN DISPOSABLE) ×2 IMPLANT
GOWN STRL REUS W/ TWL LRG LVL3 (GOWN DISPOSABLE) ×1 IMPLANT
GOWN STRL REUS W/TWL LRG LVL3 (GOWN DISPOSABLE) ×1
GUIDEPIN 3.2X17.5 THRD DISP (PIN) ×2 IMPLANT
GUIDEWIRE BALL NOSE 80CM (WIRE) ×2 IMPLANT
KIT TURNOVER KIT A (KITS) ×2 IMPLANT
NAIL VERSANAIL TIBIAL 12X3.75 (Nail) ×2 IMPLANT
NEEDLE FILTER BLUNT 18X 1/2SAF (NEEDLE) ×1
NEEDLE FILTER BLUNT 18X1 1/2 (NEEDLE) ×1 IMPLANT
NS IRRIG 1000ML POUR BTL (IV SOLUTION) ×2 IMPLANT
PACK TOTAL KNEE (MISCELLANEOUS) ×2 IMPLANT
PADDING CAST 6X4YD NS (MISCELLANEOUS) ×2
PADDING CAST BLEND 4X4 NS (MISCELLANEOUS) ×4 IMPLANT
PADDING CAST COTTON 6X4 NS (MISCELLANEOUS) ×2 IMPLANT
REAMER ROD DEEP FLUTE 2.5X950 (INSTRUMENTS) ×2 IMPLANT
SCREW ACECAP 34MM (Screw) ×2 IMPLANT
SCREW ACECAP 38MM (Screw) ×2 IMPLANT
SCREW ACECAP 40MM (Screw) ×2 IMPLANT
SCREW ACECAP 50MM (Screw) ×2 IMPLANT
SCREW PROXIMAL DEPUY (Screw) ×1 IMPLANT
SCREW PRXML FT 40X5.5XNS LF (Screw) ×1 IMPLANT
SPLINT CAST 1 STEP 4X30 (MISCELLANEOUS) ×4 IMPLANT
SPLINT CAST 1 STEP 5X30 WHT (MISCELLANEOUS) ×4 IMPLANT
STAPLER SKIN PROX 35W (STAPLE) ×2 IMPLANT
SUT ETHILON 4-0 (SUTURE) ×1
SUT ETHILON 4-0 FS2 18XMFL BLK (SUTURE) ×1
SUT MNCRL AB 3-0 PS2 27 (SUTURE) ×2 IMPLANT
SUT VIC AB 0 CT2 27 (SUTURE) ×2 IMPLANT
SUT VIC AB 2-0 CT2 27 (SUTURE) ×2 IMPLANT
SUTURE ETHLN 4-0 FS2 18XMF BLK (SUTURE) ×1 IMPLANT
SYR 10ML LL (SYRINGE) ×2 IMPLANT

## 2018-01-22 NOTE — Progress Notes (Signed)
Subjective:  POST-OP CHECK:  Patient reports right leg pain as mild to moderate.  Patient has pulled out his foley catheter.  He otherwise appears comfortable and has no complaints.  Objective:   VITALS:   Vitals:   01/22/18 1729 01/22/18 1819 01/22/18 1949 01/22/18 1950  BP: (!) 164/73 (!) 167/70 (!) 121/59   Pulse: (!) 104 (!) 111 (!) 111   Resp:   16   Temp: 99.1 F (37.3 C) 98.7 F (37.1 C) 98.7 F (37.1 C)   TempSrc: Oral Oral Oral   SpO2: 95% 96% (!) 89% 91%  Weight:      Height:        PHYSICAL EXAM: Right lower extremity:  Splint and dressing are C/D/I.  He can flex and extend his toes.  His toes are well perfused and he has intact sensation to light touch in all 5 toes and the first dorsal webspace. Neurovascular intact Sensation intact distally  LABS  Results for orders placed or performed during the hospital encounter of 01/21/18 (from the past 24 hour(s))  Type and screen Ordered by PROVIDER DEFAULT     Status: None   Collection Time: 01/21/18  9:29 PM  Result Value Ref Range   ABO/RH(D) O POS    Antibody Screen NEG    Sample Expiration      01/24/2018 Performed at [facility], [address] [place], [place] [address]   Protime-INR     Status: None   Collection Time: 01/22/18 12:14 AM  Result Value Ref Range   Prothrombin Time 12.9 11.4 - 15.2 seconds   INR 0.98   APTT     Status: None   Collection Time: 01/22/18 12:14 AM  Result Value Ref Range   aPTT 31 24 - 36 seconds  Surgical PCR screen     Status: None   Collection Time: 01/22/18 12:25 AM  Result Value Ref Range   MRSA, PCR NEGATIVE NEGATIVE   Staphylococcus aureus NEGATIVE NEGATIVE  Glucose, capillary     Status: Abnormal   Collection Time: 01/22/18  2:44 AM  Result Value Ref Range   Glucose-Capillary 176 (H) 65 - 99 mg/dL  Glucose, capillary     Status: Abnormal   Collection Time: 01/22/18  8:35 AM  Result Value Ref Range   Glucose-Capillary 190 (H) 65 - 99 mg/dL   Glucose, capillary     Status: Abnormal   Collection Time: 01/22/18 10:05 AM  Result Value Ref Range   Glucose-Capillary 201 (H) 65 - 99 mg/dL   Comment 1 Notify RN   Glucose, capillary     Status: Abnormal   Collection Time: 01/22/18 12:24 PM  Result Value Ref Range   Glucose-Capillary 203 (H) 65 - 99 mg/dL  Glucose, capillary     Status: Abnormal   Collection Time: 01/22/18  3:48 PM  Result Value Ref Range   Glucose-Capillary 196 (H) 65 - 99 mg/dL  Glucose, capillary     Status: Abnormal   Collection Time: 01/22/18  4:49 PM  Result Value Ref Range   Glucose-Capillary 178 (H) 65 - 99 mg/dL   Comment 1 Notify RN     Dg Tibia/fibula Right  Result Date: 01/22/2018 CLINICAL DATA:  Postop EXAM: RIGHT TIBIA AND FIBULA - 2 VIEW COMPARISON:  01/21/2018 FINDINGS: Interval intramedullary rod and screw fixation of the tibia across distal tibial shaft fracture with less than 1/4 bone with of lateral and posterior displacement of distal fracture fragment, improved since prior. Acute displaced  proximal fibular shaft fracture with about 1/2 bone with of lateral and anterior displacement of distal fracture fragment. IMPRESSION: 1. Interval surgical fixation of distal tibial fracture with decreased displacement 2. Acute proximal fibular fracture also with decreased displacement. Electronically Signed   By: Jasmine Pang M.D.   On: 01/22/2018 16:44   Dg Tibia/fibula Right  Result Date: 01/22/2018 CLINICAL DATA:  Intraoperative fixation EXAM: RIGHT TIBIA AND FIBULA - 2 VIEW; DG C-ARM 61-120 MIN COMPARISON:  January 21, 2018 FLUOROSCOPY TIME:  3 minutes 30 seconds; dose 5.5 mGy; 6 acquired images FINDINGS: Frontal and lateral views obtained. There is screw and rod fixation through a fracture of the junction of the mid and distal thirds of the tibia. Alignment is essentially anatomic postoperatively. There is a fracture of the proximal fibular diaphysis with slight lateral displacement distally. No dislocation  evident. Visualized joint spaces appear unremarkable. IMPRESSION: Postoperative fixation of fracture of the junction of the mid and distal thirds of the tibia with alignment essentially anatomic. Mildly displaced fracture proximal fibula. Electronically Signed   By: Bretta Bang III M.D.   On: 01/22/2018 16:11   Dg Tibia/fibula Right  Result Date: 01/21/2018 CLINICAL DATA:  Fall in [place].  Right lower leg pain. EXAM: RIGHT TIBIA AND FIBULA - 2 VIEW COMPARISON:  None. FINDINGS: There is a displaced slightly comminuted oblique fracture of the proximal fibular diaphysis with 1 shaft's with of anterior and lateral displacement of the distal fragment. There is an oblique displaced fracture of the distal tibial diaphysis with 1/2 shaft's with posterolateral displacement of the distal fragment. Mild medial and posterior angulation of the distal fragment. Degenerative changes of the knee. Significant degenerative changes of the mid and hindfoot. Loss of height of the calcaneus which may be acute or chronic. Small vessel atherosclerotic disease is present. IMPRESSION: Displaced oblique fracture of the distal tibial diaphysis and displaced oblique slightly comminuted fracture of the proximal fibular diaphysis. Loss of height of the calcaneus which may be from acute or chronic fracture. Electronically Signed   By: Elberta Fortis M.D.   On: 01/21/2018 20:42   Dg Ankle Complete Right  Result Date: 01/21/2018 CLINICAL DATA:  Fall in [place]. EXAM: RIGHT ANKLE - COMPLETE 3+ VIEW COMPARISON:  None. FINDINGS: There is a oblique displaced fracture of the distal tibial diaphysis with mild medial and posterior angulation of the distal fragment and 1/2 chest with of displacement. Ankle mortise is within normal. Loss of height of the calcaneus with subtle horizontal lucency over the posterior calcaneus on the lateral film and as these findings may be due to acute or chronic fracture. Degenerative changes of  the mid to hindfoot. Small vessel atherosclerotic disease is present. IMPRESSION: Displaced oblique fracture of the distal tibial diaphysis. Loss of height of the calcaneus with subtle horizontal lucency over the posterior calcaneus as findings may be due to an acute or chronic fracture. Electronically Signed   By: Elberta Fortis M.D.   On: 01/21/2018 20:44   Chest Portable 1 View  Result Date: 01/21/2018 CLINICAL DATA:  Preoperative radiograph EXAM: PORTABLE CHEST 1 VIEW COMPARISON:  Chest radiograph 05/23/2017 FINDINGS: Unchanged cardiomegaly. No focal airspace consolidation or pulmonary edema. No pneumothorax or pleural effusion. IMPRESSION: No active disease. Electronically Signed   By: Deatra Robinson M.D.   On: 01/21/2018 23:45   Dg Foot 2 Views Right  Result Date: 01/21/2018 CLINICAL DATA:  Dorsal foot pain after fall in kitchen tonight. EXAM: RIGHT FOOT - 2 VIEW COMPARISON:  None. FINDINGS: Examination demonstrates diffuse osteopenia. There are degenerative changes over the first MTP joint. There are degenerative changes of the interphalangeal joints as well as over the mid to hindfoot region. There is a displaced transverse fracture at the base of the head of the third and fourth metatarsals. There is loss of height of the calcaneus with subtle horizontal lucency over the posterior calcaneus as this may be due to acute or chronic fracture. Small vessel atherosclerotic disease is present. IMPRESSION: Minimally displaced transverse fractures at the base of the head of the third and fourth metatarsals. Loss of height of the calcaneus with subtle horizontal lucency over the superior portion of the calcaneus which may be due to acute or chronic fracture. Degenerative changes as described. Electronically Signed   By: Elberta Fortisaniel  Boyle M.D.   On: 01/21/2018 20:47   Dg C-arm 1-60 Min  Result Date: 01/22/2018 CLINICAL DATA:  Intraoperative fixation EXAM: RIGHT TIBIA AND FIBULA - 2 VIEW; DG C-ARM 61-120 MIN  COMPARISON:  January 21, 2018 FLUOROSCOPY TIME:  3 minutes 30 seconds; dose 5.5 mGy; 6 acquired images FINDINGS: Frontal and lateral views obtained. There is screw and rod fixation through a fracture of the junction of the mid and distal thirds of the tibia. Alignment is essentially anatomic postoperatively. There is a fracture of the proximal fibular diaphysis with slight lateral displacement distally. No dislocation evident. Visualized joint spaces appear unremarkable. IMPRESSION: Postoperative fixation of fracture of the junction of the mid and distal thirds of the tibia with alignment essentially anatomic. Mildly displaced fracture proximal fibula. Electronically Signed   By: Bretta BangWilliam  Woodruff III M.D.   On: 01/22/2018 16:11    Assessment/Plan: Day of Surgery   Principal Problem:   Closed fracture of right tibia and fibula Active Problems:   Type 2 diabetes mellitus (HCC)   HLD (hyperlipidemia)   Essential hypertension   Hypothyroidism   GERD (gastroesophageal reflux disease)   Right tibial fracture  Patient stable post-op.  Pain controlled currently.  Continue IV antibiotics x 24 hours.  Check labs in the AM.  Appreciate diabetes consult.  Begin PT in AM.  Patient is non-weightbearing on right lower extremity.  CT scan of right foot ordered for the AM to assess calcaneus and metatarsal fractures.       Juanell FairlyKRASINSKI, Miyonna Ormiston , MD 01/22/2018, 9:20 PM

## 2018-01-22 NOTE — Progress Notes (Signed)
Family Meeting Note  Advance Directive:yes  Today a meeting took place with the Patient, wife at bedside    The following clinical team members were present during this meeting:MD  The following were discussed:Patient's diagnosis: Right-sided tibia-fibula fracture, diabetes mellitus, hypertension, hyperlipidemia, GERD, hypothyroidism plan of care discussed in detail with the patient and wife at bedside.  They verbalized understanding of the plan,  Patient's progosis: Unable to determine and Goals for treatment: Full Code, wife HCPOA  Additional follow-up to be provided: Hospitalist, orthopedics  Time spent during discussion:17 MIN   Ramonita LabAruna Amalio Loe, MD

## 2018-01-22 NOTE — Progress Notes (Signed)
Inpatient Diabetes Program Recommendations  AACE/ADA: New Consensus Statement on Inpatient Glycemic Control (2015)  Target Ranges:  Prepandial:   less than 140 mg/dL      Peak postprandial:   less than 180 mg/dL (1-2 hours)      Critically ill patients:  140 - 180 mg/dL  Results for [name], [name] (MRN [id number]) as of 01/22/2018 11:05  Ref. Range 01/21/2018 20:59 01/22/2018 02:44 01/22/2018 08:35 01/22/2018 10:05  Glucose-Capillary Latest Ref Range: 65 - 99 mg/dL 409152 (H) 811176 (H) 914190 (H) 201 (H)   Results for [name], [name] (MRN [id number]) as of 01/22/2018 11:05  Ref. Range 12/04/2017 10:54  Hemoglobin A1C Unknown 6.5   Review of Glycemic Control  Diabetes history: DM Outpatient Diabetes medications: Lantus 56 units daily in the morning, Humulin Regular 16 units BID with breakfast and supper; plus additional units for correction if needed (max dose of 30 units at one time) Current orders for Inpatient glycemic control: Humulin Regular 3 units TID with meals and at bedtime  Inpatient Diabetes Program Recommendations:  Insulin - Basal: Please consider ordering Lantus 25 units Q24H (based approximately on 104 kg x 0.25 units). Correction (SSI): Please order Humulin Regular 0-15 units TID with meals and at bedtime (could use sliding scale similar to the one for Novolog 0-15 units on the regular glycemic control order set). HgbA1C: A1C was 6.5% on 12/04/2017 indicating an average glucose of 140 mg/dl.  NOTE: Consult noted for 'DM and for surgery, want only custom scale'. Talked with patient over the phone regarding DM control and outpatient regimen for DM management. Patient states that he is takes Lantus 56 units daily in the morning, Humulin Regular 16 units BID with breakfast and supper; plus additional units for correction if needed (max dose of 30 units at one time) as an outpatient for DM control. Patient reports that his glucose usually runs very well and he does not require much additional  insulin for correction per his sliding scale. Last A1C was 6.5% on 12/04/2017.  Patient reports that he took Lantus 56 units at home on 01/21/18 and that his wife brought his home supply of Humulin Regular to the hospital so he can use it while inpatient. Inquired about allergy in chart for "Insulins" and patient reports that he isn't really allergic to any insulin but notes that Novolin just don't seem to work for him. Noted patient is NPO and will have surgery today. Discussed recommendations for Lantus and correction scale of Humulin Regular ACHS. Patient agreeable to recommendations.  Will send text page to Dr. Amado CoeGouru with recommendations.  Thanks, Orlando PennerMarie Benen Weida, RN, MSN, CDE Diabetes Coordinator Inpatient Diabetes Program [phone] (Team Pager from 8am to 5pm)

## 2018-01-22 NOTE — Anesthesia Preprocedure Evaluation (Signed)
Anesthesia Evaluation  Patient identified by MRN, date of birth, ID band Patient awake    Reviewed: Allergy & Precautions, NPO status , Patient's Chart, lab work & pertinent test results  History of Anesthesia Complications Negative for: history of anesthetic complications  Airway Mallampati: II       Dental   Pulmonary neg sleep apnea, neg COPD, former smoker,           Cardiovascular hypertension, Pt. on medications (-) Past MI and (-) CHF (-) dysrhythmias (-) Valvular Problems/Murmurs     Neuro/Psych neg Seizures    GI/Hepatic Neg liver ROS, GERD  Medicated,  Endo/Other  diabetes, Type 2, Insulin DependentHypothyroidism   Renal/GU Renal InsufficiencyRenal disease     Musculoskeletal   Abdominal   Peds  Hematology   Anesthesia Other Findings   Reproductive/Obstetrics                             Anesthesia Physical Anesthesia Plan  ASA: III  Anesthesia Plan: Spinal   Post-op Pain Management:    Induction:   PONV Risk Score and Plan:   Airway Management Planned: Nasal Cannula  Additional Equipment:   Intra-op Plan:   Post-operative Plan:   Informed Consent: I have reviewed the patients History and Physical, chart, labs and discussed the procedure including the risks, benefits and alternatives for the proposed anesthesia with the patient or authorized representative who has indicated his/her understanding and acceptance.     Plan Discussed with:   Anesthesia Plan Comments:         Anesthesia Quick Evaluation

## 2018-01-22 NOTE — Progress Notes (Signed)
Pt returned to room 144 from PACU. Pt is A&Ox4, denies pain. Ace splint is intact to RLE with leg elevated.  Pt can move toes and reports decreased sensation to RLE. Pt educated about pain medication regimen.   Pt was educated on the humulin sliding scale orders postop. Pt became very upset and continued to tell me that he takes 16 units of humulin BID plus sliding scale with his custom scale and adamant that he needed 16 units now with CBG 178, plus Lantus. RN contacted Dr. Amado CoeGouru, who spoke with pt and his wife. Dr. Amado CoeGouru explained to pt that hyperglycemia is less dangerous than hypoglycemia, and that adjustments can be made tomorrow. Pt still very concerned and unhappy that no additional humulin will be ordered at this time. He was educated that CBG will be checked again at bedtime and 3 am. Pt states he is calling Dr. Welton FlakesKhan tomorrow. Charge RN aware.   [place], Latricia HeftKorie G

## 2018-01-22 NOTE — Transfer of Care (Signed)
Immediate Anesthesia Transfer of Care Note  Patient: Evelina BucyDonald R Fakhouri  Procedure(s) Performed: INTRAMEDULLARY (IM) NAIL INTERTROCHANTRIC (Right Leg Lower)  Patient Location: PACU  Anesthesia Type:Spinal  Level of Consciousness: awake and patient cooperative  Airway & Oxygen Therapy: Patient Spontanous Breathing and Patient connected to nasal cannula oxygen  Post-op Assessment: Report given to RN and Post -op Vital signs reviewed and stable  Post vital signs: Reviewed and stable  Last Vitals:  Vitals Value Taken Time  BP 120/60 01/22/2018  3:47 PM  Temp 36.3 C 01/22/2018  3:47 PM  Pulse 71 01/22/2018  3:51 PM  Resp 20 01/22/2018  3:51 PM  SpO2 97 % 01/22/2018  3:51 PM  Vitals shown include unvalidated device data.  Last Pain:  Vitals:   01/22/18 1547  TempSrc:   PainSc: 0-No pain      Patients Stated Pain Goal: 0 (01/22/18 1222)  Complications: No apparent anesthesia complications

## 2018-01-22 NOTE — Anesthesia Procedure Notes (Signed)
Date/Time: 01/22/2018 1:15 PM Performed by: Henrietta HooverPope, Ingri Diemer, CRNA Pre-anesthesia Checklist: Emergency Drugs available, Patient identified, Suction available, Patient being monitored and Timeout performed Oxygen Delivery Method: Nasal cannula Placement Confirmation: positive ETCO2

## 2018-01-22 NOTE — Anesthesia Procedure Notes (Signed)
Spinal  Start time: 01/22/2018 1:17 PM End time: 01/22/2018 1:30 PM Staffing Resident/CRNA: Henrietta HooverPope, Leonard Feigel, CRNA Preanesthetic Checklist Completed: patient identified, site marked, surgical consent, pre-op evaluation, timeout performed, IV checked, risks and benefits discussed and monitors and equipment checked Spinal Block Patient position: sitting Prep: Betadine Patient monitoring: heart rate, cardiac monitor, continuous pulse ox and blood pressure Approach: midline Location: L3-4 Injection technique: single-shot Needle Needle type: Pencan  Needle gauge: 22 G Needle length: 10 cm Needle insertion depth: 8 cm Catheter type: closed end flexible Assessment Sensory level: T6

## 2018-01-22 NOTE — Progress Notes (Signed)
[facility] at [facility]   PATIENT NAME: [name]    MR#:  [id number]  DATE OF BIRTH:  03/02/1941  SUBJECTIVE:  CHIEF COMPLAINT: Patient's pain is manageable.  Reports he cannot take any other insulin other than Lantus and Humulin R.  Awaiting for surgery today.  Wife at bedside  REVIEW OF SYSTEMS:  CONSTITUTIONAL: No fever, fatigue or weakness.  EYES: No blurred or double vision.  EARS, NOSE, AND THROAT: No tinnitus or ear pain.  RESPIRATORY: No cough, shortness of breath, wheezing or hemoptysis.  CARDIOVASCULAR: No chest pain, orthopnea, edema.  GASTROINTESTINAL: No nausea, vomiting, diarrhea or abdominal pain.  GENITOURINARY: No dysuria, hematuria.  ENDOCRINE: No polyuria, nocturia,  HEMATOLOGY: No anemia, easy bruising or bleeding SKIN: No rash or lesion. MUSCULOSKELETAL: Right leg pain NEUROLOGIC: No tingling, numbness, weakness.  PSYCHIATRY: No anxiety or depression.   DRUG ALLERGIES:   Allergies  Allergen Reactions  . Insulins Other (See Comments)    Pt states that no insulin works for him, but Lantus and Humulin R.     . Penicillins Rash and Other (See Comments)    Has patient had a PCN reaction causing immediate rash, facial/tongue/throat swelling, SOB or lightheadedness with hypotension: No Has patient had a PCN reaction causing severe rash involving mucus membranes or skin necrosis: No Has patient had a PCN reaction that required hospitalization No Has patient had a PCN reaction occurring within the last 10 years: No If all of the above answers are "NO", then may proceed with Cephalosporin use.    VITALS:  Blood pressure 135/77, pulse 95, temperature 98.2 F (36.8 C), temperature source Tympanic, resp. rate 16, height 5\' 11"  (1.803 m), weight 104.8 kg (231 lb), SpO2 97 %.  PHYSICAL EXAMINATION:  GENERAL:  77 y.o.-year-old patient lying in the bed with no acute distress.  EYES: Pupils equal, round, reactive to light and  accommodation. No scleral icterus. Extraocular muscles intact.  HEENT: Head atraumatic, normocephalic. Oropharynx and nasopharynx clear.  NECK:  Supple, no jugular venous distention. No thyroid enlargement, no tenderness.  LUNGS: Normal breath sounds bilaterally, no wheezing, rales,rhonchi or crepitation. No use of accessory muscles of respiration.  CARDIOVASCULAR: S1, S2 normal. No murmurs, rubs, or gallops.  ABDOMEN: Soft, nontender, nondistended. Bowel sounds present.  EXTREMITIES: Right leg with splint tender no pedal edema, cyanosis, or clubbing.  NEUROLOGIC: Cranial nerves II through XII are intact. Sensation intact. Gait not checked.  PSYCHIATRIC: The patient is alert and oriented x 3.  SKIN: No obvious rash, lesion, or ulcer.    LABORATORY PANEL:   CBC Recent Labs  Lab 01/21/18 1921  WBC 7.2  HGB 13.2  HCT 39.7*  PLT 155   ------------------------------------------------------------------------------------------------------------------  Chemistries  Recent Labs  Lab 01/21/18 1921  NA 140  K 3.8  CL 105  CO2 22  GLUCOSE 183*  BUN 17  CREATININE 1.02  CALCIUM 8.9   ------------------------------------------------------------------------------------------------------------------  Cardiac Enzymes No results for input(s): TROPONINI in the last 168 hours. ------------------------------------------------------------------------------------------------------------------  RADIOLOGY:  Dg Tibia/fibula Right  Result Date: 01/21/2018 CLINICAL DATA:  Fall in [place].  Right lower leg pain. EXAM: RIGHT TIBIA AND FIBULA - 2 VIEW COMPARISON:  None. FINDINGS: There is a displaced slightly comminuted oblique fracture of the proximal fibular diaphysis with 1 shaft's with of anterior and lateral displacement of the distal fragment. There is an oblique displaced fracture of the distal tibial diaphysis with 1/2 shaft's with posterolateral displacement of the distal fragment.  Mild  medial and posterior angulation of the distal fragment. Degenerative changes of the knee. Significant degenerative changes of the mid and hindfoot. Loss of height of the calcaneus which may be acute or chronic. Small vessel atherosclerotic disease is present. IMPRESSION: Displaced oblique fracture of the distal tibial diaphysis and displaced oblique slightly comminuted fracture of the proximal fibular diaphysis. Loss of height of the calcaneus which may be from acute or chronic fracture. Electronically Signed   By: Elberta Fortisaniel  Boyle M.D.   On: 01/21/2018 20:42   Dg Ankle Complete Right  Result Date: 01/21/2018 CLINICAL DATA:  [place]. EXAM: RIGHT ANKLE - COMPLETE 3+ VIEW COMPARISON:  None. FINDINGS: There is a oblique displaced fracture of the distal tibial diaphysis with mild medial and posterior angulation of the distal fragment and 1/2 chest with of displacement. Ankle mortise is within normal. Loss of height of the calcaneus with subtle horizontal lucency over the posterior calcaneus on the lateral film and as these findings may be due to acute or chronic fracture. Degenerative changes of the mid to hindfoot. Small vessel atherosclerotic disease is present. IMPRESSION: Displaced oblique fracture of the distal tibial diaphysis. Loss of height of the calcaneus with subtle horizontal lucency over the posterior calcaneus as findings may be due to an acute or chronic fracture. Electronically Signed   By: Elberta Fortisaniel  Boyle M.D.   On: 01/21/2018 20:44   Chest Portable 1 View  Result Date: 01/21/2018 CLINICAL DATA:  Preoperative radiograph EXAM: PORTABLE CHEST 1 VIEW COMPARISON:  Chest radiograph 05/23/2017 FINDINGS: Unchanged cardiomegaly. No focal airspace consolidation or pulmonary edema. No pneumothorax or pleural effusion. IMPRESSION: No active disease. Electronically Signed   By: Deatra RobinsonKevin  Herman M.D.   On: 01/21/2018 23:45   Dg Foot 2 Views Right  Result Date: 01/21/2018 CLINICAL DATA:   Dorsal foot pain after fall in kitchen tonight. EXAM: RIGHT FOOT - 2 VIEW COMPARISON:  None. FINDINGS: Examination demonstrates diffuse osteopenia. There are degenerative changes over the first MTP joint. There are degenerative changes of the interphalangeal joints as well as over the mid to hindfoot region. There is a displaced transverse fracture at the base of the head of the third and fourth metatarsals. There is loss of height of the calcaneus with subtle horizontal lucency over the posterior calcaneus as this may be due to acute or chronic fracture. Small vessel atherosclerotic disease is present. IMPRESSION: Minimally displaced transverse fractures at the base of the head of the third and fourth metatarsals. Loss of height of the calcaneus with subtle horizontal lucency over the superior portion of the calcaneus which may be due to acute or chronic fracture. Degenerative changes as described. Electronically Signed   By: Elberta Fortisaniel  Boyle M.D.   On: 01/21/2018 20:47    EKG:   Orders placed or performed during the hospital encounter of 01/21/18  . ED EKG  . ED EKG  . EKG 12-Lead  . EKG 12-Lead  . EKG 12-Lead  . EKG 12-Lead  . EKG    ASSESSMENT AND PLAN:   # Closed fracture of right tibia and fibula -per  primary orthopedic team for management   #   Type 2 diabetes mellitus (HCC) -patient states that he can only take Humulin R insulin and Lantus long-acting insulin.  lantus 20 units q daily  Humulin R custom ss Consulted diabetic coordinator, appreciate their recommendations  #  Essential hypertension -continue home meds benazepril and hydrochlorothiazide  #  HLD (hyperlipidemia) -Lipitor  #   Hypothyroidism -home  dose thyroid replacement, patient states that he must have brand name thyroid medication-Armour Thyroid 120 mg once daily  #   GERD (gastroesophageal reflux disease) - PPI       All the records are reviewed and case discussed with Care Management/Social  Workerr. Management plans discussed with the patient, family and they are in agreement.  CODE STATUS : FC   TOTAL TIME TAKING CARE OF THIS PATIENT: 36 minutes.   POSSIBLE D/C IN 2  DAYS, DEPENDING ON CLINICAL CONDITION.  Note: This dictation was prepared with Dragon dictation along with smaller phrase technology. Any transcriptional errors that result from this process are unintentional.   Ramonita Lab M.D on 01/22/2018 at 2:26 PM  Between 7am to 6pm - Pager - [phone] After 6pm go to [email or web address] - password [email or web address] [facility]  [facility] [place] [facility]  Office  [phone]  CC: Primary care physician; Lyndon Code, MD

## 2018-01-22 NOTE — Op Note (Signed)
DATE OF SURGERY:  01/22/2018  TIME: 4:09 PM  PATIENT NAME:  [name]  AGE: 77 y.o.  PRE-OPERATIVE DIAGNOSIS:  right tibia fracture  POST-OPERATIVE DIAGNOSIS:  SAME  PROCEDURE:  INTRAMEDULLARY (IM) NAIL INTERTROCHANTRIC, RIGHT TIBIA  SURGEON:  Preslei Blakley  OPERATIVE IMPLANTS: Biomet Versanail 12 x 37.5, 40 mm proximal interlocking screw with 38 and 40 mm distal interlocking screws  PREOPERATIVE INDICATIONS:  Tony BucyDonald R Townley is a 77 y.o. year old who fell and suffered a tibia fracture. He was brought into the ER and then admitted to orthopaedics for evaluation and management of the tibia fracture.    The risks, benefits and alternatives were discussed with the patient and their family.  The risks include but are not limited to infection, bleeding, nerve or blood vessel injury, malunion, nonunion, hardware prominence, hardware failure, change in leg lengths or lower extremity rotation need for further surgery. Medical risks include but are not limited to DVT and pulmonary embolism, myocardial infarction, stroke, pneumonia, respiratory failure and death. The patient and his wife understood these risks and wished to proceed with surgery.  OPERATIVE PROCEDURE:  The patient was brought to the operating room and placed in the supine position on the radiolucent OR table.. General anesthesia was administered.   The patient was prepped and draped in a sterile fashion.  A closed reduction was performed using a tibial distractor under C-arm guidance.  The fracture reduction was confirmed on both AP and lateral views. A time out was performed to verify the patient's name, date of birth, medical record number, correct site of surgery correct procedure to be performed. It was also used to verify the patient received antibiotics and that appropriate instruments, implants and radiographic studies were available in the room. Once all in attendance were in agreement, the case began.  Patient received  clindamycin due to PCN allergy.    A mid line incision was made extending from the tibial tubercle to the inferior pole of the patella. A threaded guidepin was then inserted into the proximal tibia with a drill. A starting drill was then placed over drill pin and an entry hole was made in the proximal tibia. The threaded guidepin was removed and replaced with a ball tip guidewire which was advanced down the tibial shaft and across the fracture site into the distal tibia. Longitudinal traction and a Darrick PennaKing Tong fracture reduction clamp used over top of blue towels was used to achieve anatomic reduction in medial lateral plane.  Position of this ball tip guidewire was confirmed on AP and lateral C-arm images. Sequential reamers were then used over the ball-tipped guidewire until sufficient cortical chatter was achieved. A nail diameter of 12mm was selected which was 1-1/2 mm smaller than the last reamer. The depth of the guidewire was measured to determine the appropriate length of the nail at 37.5 cm. The nail was then assembled onto the inserting jig and advanced into position within the tibial shaft. Its final position was confirmed on AP and lateral C-arm images both proximally and distally.   Once the nail was completely seated, the drill guide for the proximal interlocking screw was placed through the guide arm for the Versanail. A guidepin was then placed through this drill guide and advanced through the lateral cortex of the tibia. The length of the drill pin was measured.  The lag screw was then advanced by hand into position. Its final position was confirmed on AP and lateral C-arm images.   Distal interlocking  screws were then placed using a perfect circle technique. Once the position of the distal interlocking screw hole was identified with C-arm, a small stab incision was made to allow the drill to approximate the medial cortex of the tibia. The drill for the interlocking screw was then advanced  bicortically. The diameter of the tibia was measured through this drill hole using a depth gauge.  An interlocking screw with the length of 40 then inserted by hand. A second interlocking screw of 38mm was then placed.  Once all interlocking screws were in position, final C-arm images of the intramedullary construct were taken in both the AP and lateral planes including the proximal and distal ends of the tibia as well as the fracture site. The insertion arm for the tibial nail was then removed.  The wounds were irrigated copiously and closed with 0 Vicryl for closure of the deep fascia and 2-0 Vicryl for his subcutaneous closure. The skin was approximated with staples. A dry sterile dressing was applied. An AO splint was also applied to the operative leg.  I was scrubbed and present the entire case and all sharp and instrument counts were correct at the conclusion of the case. Patient was transferred to hospital bed and brought to PACU in stable condition. I spoke with the patient's wife in the postoperative consultation room to let her know the case had gone without complication and the patient was stable in the recovery room.    Kathreen Devoid, MD

## 2018-01-22 NOTE — Progress Notes (Signed)
Dr Anne HahnWillis notified of CBG of 407, recheck 384. Pt is requesting to use sliding scale he uses at home. New order received

## 2018-01-22 NOTE — Progress Notes (Signed)
Subjective:  Patient seen in the pre-op area.   His wife is at the bedside.  Patient reports right leg pain as moderate.    Objective:   VITALS:   Vitals:   01/21/18 2330 01/22/18 0009 01/22/18 0837 01/22/18 1222  BP: 134/73 120/68 128/60 135/77  Pulse: (!) 101 99 (!) 107 95  Resp:  19  16  Temp:  98.4 F (36.9 C) 99.3 F (37.4 C) 98.2 F (36.8 C)  TempSrc:  Oral Oral Tympanic  SpO2:  97% 93% 97%  Weight:  104.8 kg (231 lb)    Height:        PHYSICAL EXAM: Right lower extremity: Right lower extremity is swollen. There is ecchymosis over the anterior tibia at the fracture site. The skin is closed. Compartments of the foot and leg are soft and compressible. Patient has intact sensation light touch throughout the right lower extremity. He can flex and extend his toes without pain. Patient remains neurovascularly intact.   LABS  Results for orders placed or performed during the hospital encounter of 01/21/18 (from the past 24 hour(s))  CBC     Status: Abnormal   Collection Time: 01/21/18  7:21 PM  Result Value Ref Range   WBC 7.2 3.8 - 10.6 K/uL   RBC 4.19 (L) 4.40 - 5.90 MIL/uL   Hemoglobin 13.2 13.0 - 18.0 g/dL   HCT 96.039.7 (L) 45.440.0 - 09.852.0 %   MCV 94.6 80.0 - 100.0 fL   MCH 31.5 26.0 - 34.0 pg   MCHC 33.3 32.0 - 36.0 g/dL   RDW 11.915.4 (H) 14.711.5 - 82.914.5 %   Platelets 155 150 - 440 K/uL  Basic metabolic panel     Status: Abnormal   Collection Time: 01/21/18  7:21 PM  Result Value Ref Range   Sodium 140 135 - 145 mmol/L   Potassium 3.8 3.5 - 5.1 mmol/L   Chloride 105 101 - 111 mmol/L   CO2 22 22 - 32 mmol/L   Glucose, Bld 183 (H) 65 - 99 mg/dL   BUN 17 6 - 20 mg/dL   Creatinine, Ser 5.621.02 0.61 - 1.24 mg/dL   Calcium 8.9 8.9 - 13.010.3 mg/dL   GFR calc non Af Amer >60 >60 mL/min   GFR calc Af Amer >60 >60 mL/min   Anion gap 13 5 - 15  Glucose, capillary     Status: Abnormal   Collection Time: 01/21/18  8:59 PM  Result Value Ref Range   Glucose-Capillary 152 (H) 65 - 99  mg/dL  Type and screen Ordered by PROVIDER DEFAULT     Status: None   Collection Time: 01/21/18  9:29 PM  Result Value Ref Range   ABO/RH(D) O POS    Antibody Screen NEG    Sample Expiration      01/24/2018 Performed at [facility], [address] [place], [place] [address]   Protime-INR     Status: None   Collection Time: 01/22/18 12:14 AM  Result Value Ref Range   Prothrombin Time 12.9 11.4 - 15.2 seconds   INR 0.98   APTT     Status: None   Collection Time: 01/22/18 12:14 AM  Result Value Ref Range   aPTT 31 24 - 36 seconds  Surgical PCR screen     Status: None   Collection Time: 01/22/18 12:25 AM  Result Value Ref Range   MRSA, PCR NEGATIVE NEGATIVE   Staphylococcus aureus NEGATIVE NEGATIVE  Glucose, capillary     Status:  Abnormal   Collection Time: 01/22/18  2:44 AM  Result Value Ref Range   Glucose-Capillary 176 (H) 65 - 99 mg/dL  Glucose, capillary     Status: Abnormal   Collection Time: 01/22/18  8:35 AM  Result Value Ref Range   Glucose-Capillary 190 (H) 65 - 99 mg/dL  Glucose, capillary     Status: Abnormal   Collection Time: 01/22/18 10:05 AM  Result Value Ref Range   Glucose-Capillary 201 (H) 65 - 99 mg/dL   Comment 1 Notify RN     Dg Tibia/fibula Right  Result Date: 01/21/2018 CLINICAL DATA:  Fall in [place].  Right lower leg pain. EXAM: RIGHT TIBIA AND FIBULA - 2 VIEW COMPARISON:  None. FINDINGS: There is a displaced slightly comminuted oblique fracture of the proximal fibular diaphysis with 1 shaft's with of anterior and lateral displacement of the distal fragment. There is an oblique displaced fracture of the distal tibial diaphysis with 1/2 shaft's with posterolateral displacement of the distal fragment. Mild medial and posterior angulation of the distal fragment. Degenerative changes of the knee. Significant degenerative changes of the mid and hindfoot. Loss of height of the calcaneus which may be acute or chronic. Small vessel  atherosclerotic disease is present. IMPRESSION: Displaced oblique fracture of the distal tibial diaphysis and displaced oblique slightly comminuted fracture of the proximal fibular diaphysis. Loss of height of the calcaneus which may be from acute or chronic fracture. Electronically Signed   By: Elberta Fortis M.D.   On: 01/21/2018 20:42   Dg Ankle Complete Right  Result Date: 01/21/2018 CLINICAL DATA:  Fall in [place]. EXAM: RIGHT ANKLE - COMPLETE 3+ VIEW COMPARISON:  None. FINDINGS: There is a oblique displaced fracture of the distal tibial diaphysis with mild medial and posterior angulation of the distal fragment and 1/2 chest with of displacement. Ankle mortise is within normal. Loss of height of the calcaneus with subtle horizontal lucency over the posterior calcaneus on the lateral film and as these findings may be due to acute or chronic fracture. Degenerative changes of the mid to hindfoot. Small vessel atherosclerotic disease is present. IMPRESSION: Displaced oblique fracture of the distal tibial diaphysis. Loss of height of the calcaneus with subtle horizontal lucency over the posterior calcaneus as findings may be due to an acute or chronic fracture. Electronically Signed   By: Elberta Fortis M.D.   On: 01/21/2018 20:44   Chest Portable 1 View  Result Date: 01/21/2018 CLINICAL DATA:  Preoperative radiograph EXAM: PORTABLE CHEST 1 VIEW COMPARISON:  Chest radiograph 05/23/2017 FINDINGS: Unchanged cardiomegaly. No focal airspace consolidation or pulmonary edema. No pneumothorax or pleural effusion. IMPRESSION: No active disease. Electronically Signed   By: Deatra Robinson M.D.   On: 01/21/2018 23:45   Dg Foot 2 Views Right  Result Date: 01/21/2018 CLINICAL DATA:  Dorsal foot pain after fall in [place] tonight. EXAM: RIGHT FOOT - 2 VIEW COMPARISON:  None. FINDINGS: Examination demonstrates diffuse osteopenia. There are degenerative changes over the first MTP joint. There are degenerative  changes of the interphalangeal joints as well as over the mid to hindfoot region. There is a displaced transverse fracture at the base of the head of the third and fourth metatarsals. There is loss of height of the calcaneus with subtle horizontal lucency over the posterior calcaneus as this may be due to acute or chronic fracture. Small vessel atherosclerotic disease is present. IMPRESSION: Minimally displaced transverse fractures at the base of the head of the third and fourth metatarsals.  Loss of height of the calcaneus with subtle horizontal lucency over the superior portion of the calcaneus which may be due to acute or chronic fracture. Degenerative changes as described. Electronically Signed   By: Elberta Fortis M.D.   On: 01/21/2018 20:47    Assessment/Plan: Day of Surgery   Principal Problem:   Closed fracture of right tibia and fibula Active Problems:   Type 2 diabetes mellitus (HCC)   HLD (hyperlipidemia)   Essential hypertension   Hypothyroidism   GERD (gastroesophageal reflux disease)   Right tibial fracture  Patient has been nothing by mouth after midnight. He has been cleared by the medicine service for surgery. The plan is for intramedullary fixation of his right tibia fracture.  I explained the details of the surgery and postoperative course with the patient his wife. I reviewed the risks and benefits of surgery with the patient's wife.  Anderson the risks of surgery included infection, bleeding, nerve or blood vessel injury, knee stiffness, persistent pain, malunion, nonunion, hardware failure and the need for further surgery. Medical risks include but are not limited to DVT and pulmonary embolism, myocardial infarction, stroke, pneumonia, respiratory failure and death. Patient understood these resources proceed.    Juanell Fairly , MD 01/22/2018, 12:51 PM

## 2018-01-22 NOTE — Anesthesia Post-op Follow-up Note (Signed)
Anesthesia QCDR form completed.        

## 2018-01-23 ENCOUNTER — Inpatient Hospital Stay: Payer: Medicare HMO

## 2018-01-23 ENCOUNTER — Encounter: Payer: Self-pay | Admitting: Orthopedic Surgery

## 2018-01-23 LAB — BASIC METABOLIC PANEL
Anion gap: 7 (ref 5–15)
BUN: 18 mg/dL (ref 6–20)
CO2: 26 mmol/L (ref 22–32)
CREATININE: 1.05 mg/dL (ref 0.61–1.24)
Calcium: 8.2 mg/dL — ABNORMAL LOW (ref 8.9–10.3)
Chloride: 107 mmol/L (ref 101–111)
GLUCOSE: 226 mg/dL — AB (ref 65–99)
Potassium: 4.1 mmol/L (ref 3.5–5.1)
Sodium: 140 mmol/L (ref 135–145)

## 2018-01-23 LAB — GLUCOSE, CAPILLARY
GLUCOSE-CAPILLARY: 218 mg/dL — AB (ref 65–99)
Glucose-Capillary: 199 mg/dL — ABNORMAL HIGH (ref 65–99)
Glucose-Capillary: 244 mg/dL — ABNORMAL HIGH (ref 65–99)
Glucose-Capillary: 277 mg/dL — ABNORMAL HIGH (ref 65–99)
Glucose-Capillary: 298 mg/dL — ABNORMAL HIGH (ref 65–99)

## 2018-01-23 LAB — URINE CULTURE: CULTURE: NO GROWTH

## 2018-01-23 LAB — CBC
HCT: 31.9 % — ABNORMAL LOW (ref 40.0–52.0)
Hemoglobin: 10.8 g/dL — ABNORMAL LOW (ref 13.0–18.0)
MCH: 32.1 pg (ref 26.0–34.0)
MCHC: 33.8 g/dL (ref 32.0–36.0)
MCV: 95.1 fL (ref 80.0–100.0)
Platelets: 123 K/uL — ABNORMAL LOW (ref 150–440)
RBC: 3.35 MIL/uL — ABNORMAL LOW (ref 4.40–5.90)
RDW: 15 % — ABNORMAL HIGH (ref 11.5–14.5)
WBC: 7.5 K/uL (ref 3.8–10.6)

## 2018-01-23 LAB — TYPE AND SCREEN
ABO/RH(D): O POS
Antibody Screen: NEGATIVE

## 2018-01-23 MED ORDER — INSULIN REGULAR HUMAN 100 UNIT/ML IJ SOLN
5.0000 [IU] | Freq: Three times a day (TID) | INTRAMUSCULAR | Status: DC
  Administered 2018-01-23 – 2018-01-25 (×5): 5 [IU] via SUBCUTANEOUS
  Filled 2018-01-23 (×2): qty 0.05

## 2018-01-23 MED ORDER — ASPIRIN EC 81 MG PO TBEC
81.0000 mg | DELAYED_RELEASE_TABLET | Freq: Every evening | ORAL | Status: DC
  Administered 2018-01-23 – 2018-01-26 (×4): 81 mg via ORAL
  Filled 2018-01-23 (×4): qty 1

## 2018-01-23 MED ORDER — INSULIN ASPART 100 UNIT/ML ~~LOC~~ SOLN
5.0000 [IU] | Freq: Three times a day (TID) | SUBCUTANEOUS | Status: DC
Start: 2018-01-23 — End: 2018-01-23

## 2018-01-23 MED ORDER — INSULIN GLARGINE 100 UNIT/ML ~~LOC~~ SOLN
30.0000 [IU] | Freq: Every day | SUBCUTANEOUS | Status: DC
  Administered 2018-01-23 – 2018-01-25 (×3): 30 [IU] via SUBCUTANEOUS
  Filled 2018-01-23 (×3): qty 0.3

## 2018-01-23 NOTE — Progress Notes (Addendum)
Inpatient Diabetes Program Recommendations  AACE/ADA: New Consensus Statement on Inpatient Glycemic Control (2015)  Target Ranges:  Prepandial:   less than 140 mg/dL      Peak postprandial:   less than 180 mg/dL (1-2 hours)      Critically ill patients:  140 - 180 mg/dL  Results for [name], [name] (MRN [id number]) as of 01/23/2018 07:55  Ref. Range 01/22/2018 08:35 01/22/2018 10:05 01/22/2018 12:24 01/22/2018 15:48 01/22/2018 16:49 01/22/2018 21:21 01/22/2018 21:22 01/23/2018 03:04  Glucose-Capillary Latest Ref Range: 65 - 99 mg/dL 347190 (H) 425201 (H) 956203 (H) 196 (H) 178 (H) 407 (H) 384 (H) 199 (H)    Review of Glycemic Control  Diabetes history: DM Outpatient Diabetes medications: Lantus 56 units daily in the morning, Humulin Regular 16 units BID with breakfast and supper; plus additional units for correction if needed (max dose of 30 units at one time) Current orders for Inpatient glycemic control: Humulin Regular 0-22 units ACHS, Lantus 25 units daily   Inpatient Diabetes Program Recommendations:  Insulin - Basal: Please consider increasing Lantus to 30 untis daily. Insulin - Meal Coverage: Please order Humulin Regular 5 units TID with meals for meal coverage if patient eats at least 50% of meals (in addition to Humulin Regular correction scale). HgbA1C: A1C was 6.5% on 12/04/2017 indicating an average glucose of 140 mg/dl.  NOTE: Spoke with patient regarding recommendations to improve Lantus to 30 units QHS and add Regular 5 units TID with meals for meal coverage. Explained to patient that his glycemic trends will be followed while inpatient and additional recommendations can be made if needed. Patient is agreeable with current insulin orders. Will recheck glucose at noon and make additional recommendations if needed. Discussed with Asher MuirJamie, RN and Dr. Amado CoeGouru.  Addendum 01/23/18@14 :26-Noted patient has NOT received Humulin Regular 5 units with breakfast or lunch today. Talked with Asher MuirJamie, RN and she  reports that initial order for meal coverage was put in as Novolog 5 units and has has Humulin Regular (own supply) that he has requested to take so order had to be changed from Novolog 5 units to Humulin Regular 5 units TID with meals. By the time the order was changed today at 13:08 patient had already eaten lunch and went to sleep.  Asked Asher MuirJamie, RN to be sure patient understands he did not receive extra units of Humulin Regular for meal coverage with breakfast or supper as we had discussed earlier due to the order having to be changed. Patient should receive Humulin Regular 5 units with supper today. Will follow up in the morning to determine if additional insulin orders needs to be adjusted.  Thanks, Orlando PennerMarie Avery Eustice, RN, MSN, CDE Diabetes Coordinator Inpatient Diabetes Program [phone] (Team Pager from 8am to 5pm)

## 2018-01-23 NOTE — Progress Notes (Signed)
Subjective:  POD #1 s/p IM fixation for right tibia and fibula fracture.  Patient reports right leg pain as mild to moderate.  He has had difficult with NWB restriction on the right lower extremity with PT.  CT scan today of the right foot shows multiple fractures of the left metatarsals and tarsal bones.   I have asked Dr. Orland Jarred from podiatry to review CT scan to determine if any intervention for these fractures is necessary.  Objective:   VITALS:   Vitals:   01/22/18 2354 01/23/18 0446 01/23/18 0730 01/23/18 1107  BP: (!) 111/56 123/61 (!) 143/62   Pulse: 87 87 96 (!) 102  Resp: 17 18 18    Temp: 98.4 F (36.9 C) 98.1 F (36.7 C) 98.9 F (37.2 C)   TempSrc: Oral Oral Oral   SpO2: 93% 93% 93% 94%  Weight:      Height:        PHYSICAL EXAM: Left lower extremity: Neurovascular intact Sensation intact distally Intact pulses distally Patient can flex and extend his toes. Incision: dressing C/D/I No cellulitis present Compartment soft  LABS  Results for orders placed or performed during the hospital encounter of 01/21/18 (from the past 24 hour(s))  Glucose, capillary     Status: Abnormal   Collection Time: 01/22/18  3:48 PM  Result Value Ref Range   Glucose-Capillary 196 (H) 65 - 99 mg/dL  Glucose, capillary     Status: Abnormal   Collection Time: 01/22/18  4:49 PM  Result Value Ref Range   Glucose-Capillary 178 (H) 65 - 99 mg/dL   Comment 1 Notify RN   Glucose, capillary     Status: Abnormal   Collection Time: 01/22/18  9:21 PM  Result Value Ref Range   Glucose-Capillary 407 (H) 65 - 99 mg/dL   Comment 1 Notify RN   Glucose, capillary     Status: Abnormal   Collection Time: 01/22/18  9:22 PM  Result Value Ref Range   Glucose-Capillary 384 (H) 65 - 99 mg/dL   Comment 1 Notify RN   Glucose, capillary     Status: Abnormal   Collection Time: 01/23/18  3:04 AM  Result Value Ref Range   Glucose-Capillary 199 (H) 65 - 99 mg/dL   Comment 1 Notify RN   CBC      Status: Abnormal   Collection Time: 01/23/18  4:56 AM  Result Value Ref Range   WBC 7.5 3.8 - 10.6 K/uL   RBC 3.35 (L) 4.40 - 5.90 MIL/uL   Hemoglobin 10.8 (L) 13.0 - 18.0 g/dL   HCT 16.1 (L) 09.6 - 04.5 %   MCV 95.1 80.0 - 100.0 fL   MCH 32.1 26.0 - 34.0 pg   MCHC 33.8 32.0 - 36.0 g/dL   RDW 40.9 (H) 81.1 - 91.4 %   Platelets 123 (L) 150 - 440 K/uL  Basic metabolic panel     Status: Abnormal   Collection Time: 01/23/18  4:56 AM  Result Value Ref Range   Sodium 140 135 - 145 mmol/L   Potassium 4.1 3.5 - 5.1 mmol/L   Chloride 107 101 - 111 mmol/L   CO2 26 22 - 32 mmol/L   Glucose, Bld 226 (H) 65 - 99 mg/dL   BUN 18 6 - 20 mg/dL   Creatinine, Ser 7.82 0.61 - 1.24 mg/dL   Calcium 8.2 (L) 8.9 - 10.3 mg/dL   GFR calc non Af Amer >60 >60 mL/min   GFR calc Af Amer >60 >60 mL/min  Anion gap 7 5 - 15  Glucose, capillary     Status: Abnormal   Collection Time: 01/23/18  7:57 AM  Result Value Ref Range   Glucose-Capillary 244 (H) 65 - 99 mg/dL   Comment 1 Notify RN   Glucose, capillary     Status: Abnormal   Collection Time: 01/23/18 11:38 AM  Result Value Ref Range   Glucose-Capillary 277 (H) 65 - 99 mg/dL   Comment 1 Notify RN     Dg Tibia/fibula Right  Result Date: 01/22/2018 CLINICAL DATA:  Postop EXAM: RIGHT TIBIA AND FIBULA - 2 VIEW COMPARISON:  01/21/2018 FINDINGS: Interval intramedullary rod and screw fixation of the tibia across distal tibial shaft fracture with less than 1/4 bone with of lateral and posterior displacement of distal fracture fragment, improved since prior. Acute displaced proximal fibular shaft fracture with about 1/2 bone with of lateral and anterior displacement of distal fracture fragment. IMPRESSION: 1. Interval surgical fixation of distal tibial fracture with decreased displacement 2. Acute proximal fibular fracture also with decreased displacement. Electronically Signed   By: Jasmine PangKim  Fujinaga M.D.   On: 01/22/2018 16:44   Dg Tibia/fibula Right  Result  Date: 01/22/2018 CLINICAL DATA:  Intraoperative fixation EXAM: RIGHT TIBIA AND FIBULA - 2 VIEW; DG C-ARM 61-120 MIN COMPARISON:  January 21, 2018 FLUOROSCOPY TIME:  3 minutes 30 seconds; dose 5.5 mGy; 6 acquired images FINDINGS: Frontal and lateral views obtained. There is screw and rod fixation through a fracture of the junction of the mid and distal thirds of the tibia. Alignment is essentially anatomic postoperatively. There is a fracture of the proximal fibular diaphysis with slight lateral displacement distally. No dislocation evident. Visualized joint spaces appear unremarkable. IMPRESSION: Postoperative fixation of fracture of the junction of the mid and distal thirds of the tibia with alignment essentially anatomic. Mildly displaced fracture proximal fibula. Electronically Signed   By: Bretta BangWilliam  Woodruff III M.D.   On: 01/22/2018 16:11   Dg Tibia/fibula Right  Result Date: 01/21/2018 CLINICAL DATA:  [place].  Right lower leg pain. EXAM: RIGHT TIBIA AND FIBULA - 2 VIEW COMPARISON:  None. FINDINGS: There is a displaced slightly comminuted oblique fracture of the proximal fibular diaphysis with 1 shaft's with of anterior and lateral displacement of the distal fragment. There is an oblique displaced fracture of the distal tibial diaphysis with 1/2 shaft's with posterolateral displacement of the distal fragment. Mild medial and posterior angulation of the distal fragment. Degenerative changes of the knee. Significant degenerative changes of the mid and hindfoot. Loss of height of the calcaneus which may be acute or chronic. Small vessel atherosclerotic disease is present. IMPRESSION: Displaced oblique fracture of the distal tibial diaphysis and displaced oblique slightly comminuted fracture of the proximal fibular diaphysis. Loss of height of the calcaneus which may be from acute or chronic fracture. Electronically Signed   By: Elberta Fortisaniel  Boyle M.D.   On: 01/21/2018 20:42   Dg Ankle Complete  Right  Result Date: 01/21/2018 CLINICAL DATA:  [place]. EXAM: RIGHT ANKLE - COMPLETE 3+ VIEW COMPARISON:  None. FINDINGS: There is a oblique displaced fracture of the distal tibial diaphysis with mild medial and posterior angulation of the distal fragment and 1/2 chest with of displacement. Ankle mortise is within normal. Loss of height of the calcaneus with subtle horizontal lucency over the posterior calcaneus on the lateral film and as these findings may be due to acute or chronic fracture. Degenerative changes of the mid to hindfoot.  Small vessel atherosclerotic disease is present. IMPRESSION: Displaced oblique fracture of the distal tibial diaphysis. Loss of height of the calcaneus with subtle horizontal lucency over the posterior calcaneus as findings may be due to an acute or chronic fracture. Electronically Signed   By: Elberta Fortis M.D.   On: 01/21/2018 20:44   Ct Foot Right Wo Contrast  Result Date: 01/23/2018 CLINICAL DATA:  Fall 2 days ago. Third and fourth metatarsal fractures. Tibial fracture ORIF yesterday. EXAM: CT OF THE RIGHT FOOT WITHOUT CONTRAST TECHNIQUE: Multidetector CT imaging of the right foot was performed according to the standard protocol. Multiplanar CT image reconstructions were also generated. COMPARISON:  Right foot x-rays dated January 21, 2018. FINDINGS: Bones/Joint/Cartilage There is a nondisplaced intra-articular fracture of the posterior malleolus. No articular surface step-off. There are acute fractures of the third and fourth metatarsal necks with slight lateral displacement and plantar angulation. There are nondisplaced fractures at the bases of the second, third, and fourth metatarsals. There is a slightly displaced fracture at the distal and lateral aspect of the middle cuneiform. Lisfranc alignment is maintained. There is a mildly displaced, intra-articular fracture at the medial base of the first proximal phalanx. The talar dome is intact. The ankle  mortise is symmetric. Chronic, depressed fracture of the calcaneus with evidence of pseudoarticulation between the calcaneus and fibula. Old healed fracture of the distal fifth metatarsal shaft. Mild osteoarthritis of the subtalar, talonavicular, and calcaneocuboid joints. Trace tibiotalar joint effusion. Ligaments Suboptimally assessed by CT. Muscles and Tendons Prominent fatty atrophy of the intrinsic foot musculature the visualized flexor, extensor, and peroneal tendons are grossly intact. Soft tissues Moderate soft tissue swelling about the hindfoot. Mild soft tissue swelling over the dorsum of the forefoot. No drainable fluid collection. IMPRESSION: 1. Acute, nondisplaced intra-articular fracture of the posterior malleolus without articular surface step-off. 2. Acute, mildly displaced fractures of the third and fourth metatarsal necks. 3. Acute, nondisplaced fractures at the bases of the second, third, and fourth metatarsals. Lisfranc alignment is maintained. 4. Acute, minimally displaced fracture of the middle cuneiform. 5. Acute, mildly displaced fracture at the medial base of the first proximal phalanx. 6. Chronic, healed, depressed fracture of the calcaneus with evidence of resultant lateral hindfoot impingement. Electronically Signed   By: Obie Dredge M.D.   On: 01/23/2018 10:28   Chest Portable 1 View  Result Date: 01/21/2018 CLINICAL DATA:  Preoperative radiograph EXAM: PORTABLE CHEST 1 VIEW COMPARISON:  Chest radiograph 05/23/2017 FINDINGS: Unchanged cardiomegaly. No focal airspace consolidation or pulmonary edema. No pneumothorax or pleural effusion. IMPRESSION: No active disease. Electronically Signed   By: Deatra Robinson M.D.   On: 01/21/2018 23:45   Dg Foot 2 Views Right  Result Date: 01/21/2018 CLINICAL DATA:  Dorsal foot pain after fall in kitchen tonight. EXAM: RIGHT FOOT - 2 VIEW COMPARISON:  None. FINDINGS: Examination demonstrates diffuse osteopenia. There are degenerative changes  over the first MTP joint. There are degenerative changes of the interphalangeal joints as well as over the mid to hindfoot region. There is a displaced transverse fracture at the base of the head of the third and fourth metatarsals. There is loss of height of the calcaneus with subtle horizontal lucency over the posterior calcaneus as this may be due to acute or chronic fracture. Small vessel atherosclerotic disease is present. IMPRESSION: Minimally displaced transverse fractures at the base of the head of the third and fourth metatarsals. Loss of height of the calcaneus with subtle horizontal lucency over the superior portion of  the calcaneus which may be due to acute or chronic fracture. Degenerative changes as described. Electronically Signed   By: Elberta Fortis M.D.   On: 01/21/2018 20:47   Dg C-arm 1-60 Min  Result Date: 01/22/2018 CLINICAL DATA:  Intraoperative fixation EXAM: RIGHT TIBIA AND FIBULA - 2 VIEW; DG C-ARM 61-120 MIN COMPARISON:  January 21, 2018 FLUOROSCOPY TIME:  3 minutes 30 seconds; dose 5.5 mGy; 6 acquired images FINDINGS: Frontal and lateral views obtained. There is screw and rod fixation through a fracture of the junction of the mid and distal thirds of the tibia. Alignment is essentially anatomic postoperatively. There is a fracture of the proximal fibular diaphysis with slight lateral displacement distally. No dislocation evident. Visualized joint spaces appear unremarkable. IMPRESSION: Postoperative fixation of fracture of the junction of the mid and distal thirds of the tibia with alignment essentially anatomic. Mildly displaced fracture proximal fibula. Electronically Signed   By: Bretta Bang III M.D.   On: 01/22/2018 16:11    Assessment/Plan: 1 Day Post-Op   Principal Problem:   Closed fracture of right tibia and fibula Active Problems:   Type 2 diabetes mellitus (HCC)   HLD (hyperlipidemia)   Essential hypertension   Hypothyroidism   GERD (gastroesophageal reflux  disease)   Right tibial fracture  Patient is doing well following fixation of his tibia fracture.  Continue NWB on the right lower extremity.  He post-op course is complicated by multiple fractures in the right foot.   Patient having difficult with balance and the NWB restriction.  His blood sugars from his diabetes remain high post-op.  Appreciate Diabetes consultation.  Given the multiple injuries he has sustained and his diabetes, he requires inpatient stay and will likely need SNF upon discharge.    Juanell Fairly , MD 01/23/2018, 2:25 PM

## 2018-01-23 NOTE — Evaluation (Signed)
Physical Therapy Evaluation Patient Details Name: Tony BucyDonald R Kitchens MRN: [id number] DOB: December 30, 1940 Today's Date: 01/23/2018   History of Present Illness  Pt is a 77 y.o.malewho presents with a diagnosis of right tibia and fibula fractures with 3rd and 4th metatarsal fractures to the right foot.Patient explains that he slipped in the kitchen at home sustaining the above-noted injuries. He was unable to weight-bear following his fall. He is brought [facility] where x-rays were taken and the above-noted fractures were confirmed.  Pt is now s/p IM nail to the R tibia.  Assessment also includes: DM II, HLD, essential HTN, hypothyroidism, and GERD.     Clinical Impression  Pt presents with deficits in strength, transfers, mobility, gait, balance, and activity tolerance.  Spoke with Dr. Martha ClanKrasinski who confirmed that pt is to be NWB to the RLE.  Pt required min A with RLE mobility during bed mobility tasks and min A with sit to/from stand transfers with extensive verbal cues to maintain NWB status.  Pt able to take 3-4 very small hop-to steps with min A for stability and extensive verbal cues for proper sequencing.  Pt is at a very high risk for falls and for non-compliance with WB status and is not safe to return to his prior living situation at this time.  Pt will benefit from PT services in a SNF setting upon discharge to safely address above deficits for decreased caregiver assistance and eventual return to PLOF.         Follow Up Recommendations SNF;Supervision for mobility/OOB    Equipment Recommendations  Other (comment)(TBD at next venue of care; pt will eventually require a RW which he states he has but does not know where it is)    Recommendations for Other Services       Precautions / Restrictions Precautions Precautions: Fall Restrictions Weight Bearing Restrictions: Yes RLE Weight Bearing: Non weight bearing      Mobility  Bed  Mobility Overal bed mobility: Needs Assistance Bed Mobility: Supine to Sit;Sit to Supine     Supine to sit: Min assist Sit to supine: Min assist   General bed mobility comments: Min A for RLE in/out of bed  Transfers Overall transfer level: Needs assistance Equipment used: Rolling walker (2 wheeled) Transfers: Sit to/from Stand Sit to Stand: Min assist         General transfer comment: Mod verbal and tactile cues for sequencing for WB compliance  Ambulation/Gait Ambulation/Gait assistance: Min assist Ambulation Distance (Feet): 2 Feet Assistive device: Rolling walker (2 wheeled)     Gait velocity interpretation: Below normal speed for age/gender General Gait Details: Hop-to gait pattern on the LLE with very short steps and little clearance when advancing LLE  Stairs            Wheelchair Mobility    Modified Rankin (Stroke Patients Only)       Balance Overall balance assessment: Needs assistance   Sitting balance-Leahy Scale: Normal     Standing balance support: Bilateral upper extremity supported Standing balance-Leahy Scale: Fair                               Pertinent Vitals/Pain Pain Assessment: No/denies pain    Home Living Family/patient expects to be discharged to:: Private residence Living Arrangements: Spouse/significant other Available Help at Discharge: Family;Available 24 hours/day Type of Home: House Home Access: Stairs to enter Entrance Stairs-Rails: Left Entrance Stairs-Number of  Steps: 2 Home Layout: Laundry or work area in basement;Able to live on main level with bedroom/bathroom Home Equipment: Environmental consultant - 2 wheels;Cane - single point      Prior Function Level of Independence: Independent         Comments: Ind amb limited community distances, works in the yard, Ind with all ADLs, no other fall history     Hand Dominance        Extremity/Trunk Assessment   Upper Extremity Assessment Upper Extremity  Assessment: Overall WFL for tasks assessed    Lower Extremity Assessment Lower Extremity Assessment: Generalized weakness;RLE deficits/detail RLE Deficits / Details: R hip flex 3-/5, R knee flex/ext NT formally but grossly 3/5 RLE: Unable to fully assess due to immobilization       Communication   Communication: HOH  Cognition Arousal/Alertness: Awake/alert Behavior During Therapy: WFL for tasks assessed/performed Overall Cognitive Status: Within Functional Limits for tasks assessed                                        General Comments      Exercises Total Joint Exercises Ankle Circles/Pumps: AROM;Left;10 reps Quad Sets: Strengthening;Both;10 reps Gluteal Sets: Strengthening;Both;10 reps Hip ABduction/ADduction: AAROM;Right;10 reps Straight Leg Raises: AAROM;Right;10 reps Long Arc Quad: AROM;Both;10 reps Knee Flexion: AROM;Both;10 reps Other Exercises Other Exercises: HEP education for LLE APs and BLE QS, GS, and LAQ x 10 each 5-6x/day   Assessment/Plan    PT Assessment Patient needs continued PT services  PT Problem List Decreased strength;Decreased activity tolerance;Decreased balance;Decreased mobility;Decreased knowledge of use of DME;Decreased safety awareness       PT Treatment Interventions DME instruction;Gait training;Stair training;Functional mobility training;Balance training;Therapeutic exercise;Therapeutic activities;Patient/family education    PT Goals (Current goals can be found in the Care Plan section)  Acute Rehab PT Goals Patient Stated Goal: To be able to work in the yard again PT Goal Formulation: With patient Time For Goal Achievement: 02/05/18 Potential to Achieve Goals: Good    Frequency BID   Barriers to discharge Inaccessible home environment;Decreased caregiver support Pt NWB to RLE with 2 steps to enter home    Co-evaluation               AM-PAC PT "6 Clicks" Daily Activity  Outcome Measure Difficulty  turning over in bed (including adjusting bedclothes, sheets and blankets)?: Unable Difficulty moving from lying on back to sitting on the side of the bed? : Unable Difficulty sitting down on and standing up from a chair with arms (e.g., wheelchair, bedside commode, etc,.)?: Unable Help needed moving to and from a bed to chair (including a wheelchair)?: A Lot Help needed walking in hospital room?: Total Help needed climbing 3-5 steps with a railing? : Total 6 Click Score: 7    End of Session Equipment Utilized During Treatment: Gait belt Activity Tolerance: Patient tolerated treatment well Patient left: in bed;with bed alarm set;with call bell/phone within reach(Pt declined up in chair) Nurse Communication: Mobility status PT Visit Diagnosis: Muscle weakness (generalized) (M62.81);Other abnormalities of gait and mobility (R26.89)    Time: 5621-3086 PT Time Calculation (min) (ACUTE ONLY): 33 min   Charges:   PT Evaluation $PT Eval Low Complexity: 1 Low PT Treatments $Therapeutic Exercise: 8-22 mins   PT G Codes:        DElly Modena PT, DPT 01/23/18, 11:31 AM

## 2018-01-23 NOTE — Addendum Note (Signed)
Addendum  created 01/23/18 0745 by Naomie DeanKephart, William K, MD   Attestation recorded in Intraprocedure, Intraprocedure Attestations filed

## 2018-01-23 NOTE — [place] FL2 (Signed)
[place] MEDICAID FL2 LEVEL OF CARE SCREENING TOOL     IDENTIFICATION  Patient Name: [name] Birthdate: Oct 06, 1941 Sex: male Admission Date (Current Location): 01/21/2018  [place] and [place] Number:  Chiropodist and Address:  [facility], [address], [place], [place] [address]      Provider Number: [id number]  Attending Physician Name and Address:  Juanell Fairly, MD  Relative Name and Phone Number:       Current Level of Care: Hospital Recommended Level of Care: Skilled Nursing Facility Prior Approval Number:    Date Approved/Denied:   PASRR Number: ([id number])  Discharge Plan: SNF    Current Diagnoses: Patient Active Problem List   Diagnosis Date Noted  . Hypothyroidism 01/21/2018  . GERD (gastroesophageal reflux disease) 01/21/2018  . Closed fracture of right tibia and fibula 01/21/2018  . Right tibial fracture 01/21/2018  . S/P total knee arthroplasty 12/28/2015  . ARF (acute renal failure) (HCC) 03/21/2014  . CKD (chronic kidney disease) stage 3, GFR 30-59 ml/min (HCC) 03/21/2014  . Syncope 03/19/2014  . Multiple closed fractures of pelvis (HCC) 03/18/2014  . Hypoglycemia 03/18/2014  . Pelvis fracture (HCC) 03/18/2014  . Acute encephalopathy 08/14/2013  . ANOMALY, POSTERIOR SOFT TISSUE IMPINGEMENT 10/10/2006  . OSTEOMYELITIS, HX OF 10/10/2006  . ONYCHOMYCOSIS 08/17/2006  . Type 2 diabetes mellitus (HCC) 08/17/2006  . HLD (hyperlipidemia) 08/17/2006  . CATARACT NOS 08/17/2006  . Essential hypertension 08/17/2006  . DIVERTICULOSIS, COLON 08/17/2006  . CELLULITIS, LEG, LEFT 08/17/2006  . OSTEOARTHRITIS 08/17/2006  . FRACTURE, ANKLE 08/17/2006  . COLONIC POLYPS, HX OF 08/17/2006  . Diverticulosis of large intestine without perforation or abscess without bleeding 08/17/2006    Orientation RESPIRATION BLADDER Height & Weight     Self, Time, Situation, Place  Normal Continent Weight: 231 lb (104.8  kg) Height:  5\' 11"  (180.3 cm)  BEHAVIORAL SYMPTOMS/MOOD NEUROLOGICAL BOWEL NUTRITION STATUS      Continent Diet(Regular)  AMBULATORY STATUS COMMUNICATION OF NEEDS Skin   Extensive Assist Verbally Surgical wounds(Incision Right Leg)                       Personal Care Assistance Level of Assistance  Bathing, Feeding, Dressing Bathing Assistance: Limited assistance Feeding assistance: Independent Dressing Assistance: Limited assistance     Functional Limitations Info  Sight, Hearing, Speech Sight Info: Adequate Hearing Info: Impaired Speech Info: Adequate    SPECIAL CARE FACTORS FREQUENCY  PT (By licensed PT), OT (By licensed OT)     PT Frequency: (5) OT Frequency: (5)            Contractures      Additional Factors Info  Code Status, Allergies Code Status Info: (Full Code) Allergies Info: (INSULINS, PENICILLINS )           Current Medications (01/23/2018):  This is the current hospital active medication list Current Facility-Administered Medications  Medication Dose Route Frequency Provider Last Rate Last Dose  . 0.9 % NaCl with KCl 20 mEq/ L  infusion   Intravenous Continuous Juanell Fairly, MD 75 mL/hr at 01/23/18 0600    . acetaminophen (TYLENOL) tablet 1,000 mg  1,000 mg Oral Q6H Juanell Fairly, MD   1,000 mg at 01/23/18 0617  . alum & mag hydroxide-simeth (MAALOX/MYLANTA) 200-200-20 MG/5ML suspension 30 mL  30 mL Oral Q4H PRN Juanell Fairly, MD      . aspirin EC tablet 81 mg  81 mg Oral Daily Juanell Fairly, MD  81 mg at 01/23/18 0900  . atorvastatin (LIPITOR) tablet 40 mg  40 mg Oral QHS Juanell FairlyKrasinski, Kevin, MD   40 mg at 01/22/18 2128  . benazepril (LOTENSIN) tablet 40 mg  40 mg Oral Daily Juanell FairlyKrasinski, Kevin, MD   40 mg at 01/23/18 0902  . bisacodyl (DULCOLAX) suppository 10 mg  10 mg Rectal Daily PRN Juanell FairlyKrasinski, Kevin, MD      . diphenhydrAMINE (BENADRYL) capsule 25 mg  25 mg Oral QHS PRN Juanell FairlyKrasinski, Kevin, MD      . docusate sodium (COLACE) capsule  100 mg  100 mg Oral BID Juanell FairlyKrasinski, Kevin, MD   100 mg at 01/23/18 0900  . enoxaparin (LOVENOX) injection 40 mg  40 mg Subcutaneous Q24H Juanell FairlyKrasinski, Kevin, MD   40 mg at 01/23/18 0900  . hydrochlorothiazide (HYDRODIURIL) tablet 25 mg  25 mg Oral Daily Juanell FairlyKrasinski, Kevin, MD   25 mg at 01/23/18 0900  . HYDROmorphone (DILAUDID) injection 0.5 mg  0.5 mg Intravenous Q4H PRN Juanell FairlyKrasinski, Kevin, MD      . insulin aspart (novoLOG) injection 5 Units  5 Units Subcutaneous TID AC Gouru, Aruna, MD      . insulin glargine (LANTUS) injection 30 Units  30 Units Subcutaneous Daily Gouru, Aruna, MD      . insulin regular (NOVOLIN R,HUMULIN R) 100 units/mL injection 0-22 Units  0-22 Units Subcutaneous TID AC & HS Oralia ManisWillis, David, MD   8 Units at 01/23/18 0901  . magnesium citrate solution 1 Bottle  1 Bottle Oral Once PRN Juanell FairlyKrasinski, Kevin, MD      . methocarbamol (ROBAXIN) tablet 500 mg  500 mg Oral Q6H PRN Juanell FairlyKrasinski, Kevin, MD       Or  . methocarbamol (ROBAXIN) 500 mg in dextrose 5 % 50 mL IVPB  500 mg Intravenous Q6H PRN Juanell FairlyKrasinski, Kevin, MD      . ondansetron [facility](ZOFRAN) tablet 4 mg  4 mg Oral Q6H PRN Juanell FairlyKrasinski, Kevin, MD       Or  . ondansetron [facility](ZOFRAN) injection 4 mg  4 mg Intravenous Q6H PRN Juanell FairlyKrasinski, Kevin, MD      . oxyCODONE (Oxy IR/ROXICODONE) immediate release tablet 10-15 mg  10-15 mg Oral Q4H PRN Juanell FairlyKrasinski, Kevin, MD      . oxyCODONE (Oxy IR/ROXICODONE) immediate release tablet 5-10 mg  5-10 mg Oral Q4H PRN Juanell FairlyKrasinski, Kevin, MD      . pantoprazole (PROTONIX) EC tablet 40 mg  40 mg Oral Daily Juanell FairlyKrasinski, Kevin, MD   40 mg at 01/23/18 0900  . polyethylene glycol (MIRALAX / GLYCOLAX) packet 17 g  17 g Oral Daily PRN Juanell FairlyKrasinski, Kevin, MD      . thyroid (ARMOUR) tablet 120 mg  120 mg Oral QAC breakfast Juanell FairlyKrasinski, Kevin, MD   120 mg at 01/23/18 0902  . traMADol (ULTRAM) tablet 50 mg  50 mg Oral Q6H Juanell FairlyKrasinski, Kevin, MD   50 mg at 01/23/18 [id number]     Discharge Medications: Please see discharge summary for a list of  discharge medications.  Relevant Imaging Results:  Relevant Lab Results:   Additional Information (SSN: [id number])  Payton SparkAnanda A Oneida Mckamey, Student-Social Work

## 2018-01-23 NOTE — Progress Notes (Signed)
OT Cancellation Note  Patient Details Name: Tony BucyDonald R Molter MRN: [id number] DOB: [birth year]   Cancelled Treatment:    Reason Eval/Treat Not Completed: Patient at procedure or test/ unavailable. Order received, chart reviewed. MD entered room at start of session. Will re-attempt OT evaluation at later date/time as pt is available and medically appropriate.  Richrd PrimeJamie Stiller, MPH, MS, OTR/L ascom [phone] 01/23/18, 2:06 PM

## 2018-01-23 NOTE — Progress Notes (Signed)
[facility] [place] [facility]   PATIENT NAME: Tony RaiderDonald Joseph    MR#:  [id number]  DATE OF BIRTH:  05/16/1941  SUBJECTIVE:  CHIEF COMPLAINT: Patient's pain is manageable.  Had surgery yesterday, reports he cannot take any other insulin other than Lantus and Humulin R.   Wife at bedside  REVIEW OF SYSTEMS:  CONSTITUTIONAL: No fever, fatigue or weakness.  EYES: No blurred or double vision.  EARS, NOSE, AND THROAT: No tinnitus or ear pain.  RESPIRATORY: No cough, shortness of breath, wheezing or hemoptysis.  CARDIOVASCULAR: No chest pain, orthopnea, edema.  GASTROINTESTINAL: No nausea, vomiting, diarrhea or abdominal pain.  GENITOURINARY: No dysuria, hematuria.  ENDOCRINE: No polyuria, nocturia,  HEMATOLOGY: No anemia, easy bruising or bleeding SKIN: No rash or lesion. MUSCULOSKELETAL: Right leg pain NEUROLOGIC: No tingling, numbness, weakness.  PSYCHIATRY: No anxiety or depression.   DRUG ALLERGIES:   Allergies  Allergen Reactions  . Insulins Other (See Comments)    Pt states that no insulin works for him, but Lantus and Humulin R.     . Penicillins Rash and Other (See Comments)    Has patient had a PCN reaction causing immediate rash, facial/tongue/throat swelling, SOB or lightheadedness with hypotension: No Has patient had a PCN reaction causing severe rash involving mucus membranes or skin necrosis: No Has patient had a PCN reaction that required hospitalization No Has patient had a PCN reaction occurring within the last 10 years: No If all of the above answers are "NO", then may proceed with Cephalosporin use.    VITALS:  Blood pressure (!) 143/62, pulse (!) 102, temperature 98.9 F (37.2 C), temperature source Oral, resp. rate 18, height 5\' 11"  (1.803 m), weight 104.8 kg (231 lb), SpO2 94 %.  PHYSICAL EXAMINATION:  GENERAL:  77 y.o.-year-old patient lying in the bed with no acute distress.  EYES: Pupils equal, round, reactive to light and  accommodation. No scleral icterus. Extraocular muscles intact.  HEENT: Head atraumatic, normocephalic. Oropharynx and nasopharynx clear.  NECK:  Supple, no jugular venous distention. No thyroid enlargement, no tenderness.  LUNGS: Normal breath sounds bilaterally, no wheezing, rales,rhonchi or crepitation. No use of accessory muscles of respiration.  CARDIOVASCULAR: S1, S2 normal. No murmurs, rubs, or gallops.  ABDOMEN: Soft, nontender, nondistended. Bowel sounds present.  EXTREMITIES: Right leg s/p surgery,  No cyanosis, or clubbing.  NEUROLOGIC: Cranial nerves II through XII are intact. Sensation intact. Gait not checked.  PSYCHIATRIC: The patient is alert and oriented x 3.  SKIN: No obvious rash, lesion, or ulcer.    LABORATORY PANEL:   CBC Recent Labs  Lab 01/23/18 0456  WBC 7.5  HGB 10.8*  HCT 31.9*  PLT 123*   ------------------------------------------------------------------------------------------------------------------  Chemistries  Recent Labs  Lab 01/23/18 0456  NA 140  K 4.1  CL 107  CO2 26  GLUCOSE 226*  BUN 18  CREATININE 1.05  CALCIUM 8.2*   ------------------------------------------------------------------------------------------------------------------  Cardiac Enzymes No results for input(s): TROPONINI in the last 168 hours. ------------------------------------------------------------------------------------------------------------------  RADIOLOGY:  Dg Tibia/fibula Right  Result Date: 01/22/2018 CLINICAL DATA:  Postop EXAM: RIGHT TIBIA AND FIBULA - 2 VIEW COMPARISON:  01/21/2018 FINDINGS: Interval intramedullary rod and screw fixation of the tibia across distal tibial shaft fracture with less than 1/4 bone with of lateral and posterior displacement of distal fracture fragment, improved since prior. Acute displaced proximal fibular shaft fracture with about 1/2 bone with of lateral and anterior displacement of distal fracture fragment. IMPRESSION: 1.  Interval surgical fixation  of distal tibial fracture with decreased displacement 2. Acute proximal fibular fracture also with decreased displacement. Electronically Signed   By: Jasmine Pang M.D.   On: 01/22/2018 16:44   Dg Tibia/fibula Right  Result Date: 01/22/2018 CLINICAL DATA:  Intraoperative fixation EXAM: RIGHT TIBIA AND FIBULA - 2 VIEW; DG C-ARM 61-120 MIN COMPARISON:  January 21, 2018 FLUOROSCOPY TIME:  3 minutes 30 seconds; dose 5.5 mGy; 6 acquired images FINDINGS: Frontal and lateral views obtained. There is screw and rod fixation through a fracture of the junction of the mid and distal thirds of the tibia. Alignment is essentially anatomic postoperatively. There is a fracture of the proximal fibular diaphysis with slight lateral displacement distally. No dislocation evident. Visualized joint spaces appear unremarkable. IMPRESSION: Postoperative fixation of fracture of the junction of the mid and distal thirds of the tibia with alignment essentially anatomic. Mildly displaced fracture proximal fibula. Electronically Signed   By: Bretta Bang III M.D.   On: 01/22/2018 16:11   Dg Tibia/fibula Right  Result Date: 01/21/2018 CLINICAL DATA:  Fall in [place].  Right lower leg pain. EXAM: RIGHT TIBIA AND FIBULA - 2 VIEW COMPARISON:  None. FINDINGS: There is a displaced slightly comminuted oblique fracture of the proximal fibular diaphysis with 1 shaft's with of anterior and lateral displacement of the distal fragment. There is an oblique displaced fracture of the distal tibial diaphysis with 1/2 shaft's with posterolateral displacement of the distal fragment. Mild medial and posterior angulation of the distal fragment. Degenerative changes of the knee. Significant degenerative changes of the mid and hindfoot. Loss of height of the calcaneus which may be acute or chronic. Small vessel atherosclerotic disease is present. IMPRESSION: Displaced oblique fracture of the distal tibial diaphysis and  displaced oblique slightly comminuted fracture of the proximal fibular diaphysis. Loss of height of the calcaneus which may be from acute or chronic fracture. Electronically Signed   By: Elberta Fortis M.D.   On: 01/21/2018 20:42   Dg Ankle Complete Right  Result Date: 01/21/2018 CLINICAL DATA:  Fall in [place]. EXAM: RIGHT ANKLE - COMPLETE 3+ VIEW COMPARISON:  None. FINDINGS: There is a oblique displaced fracture of the distal tibial diaphysis with mild medial and posterior angulation of the distal fragment and 1/2 chest with of displacement. Ankle mortise is within normal. Loss of height of the calcaneus with subtle horizontal lucency over the posterior calcaneus on the lateral film and as these findings may be due to acute or chronic fracture. Degenerative changes of the mid to hindfoot. Small vessel atherosclerotic disease is present. IMPRESSION: Displaced oblique fracture of the distal tibial diaphysis. Loss of height of the calcaneus with subtle horizontal lucency over the posterior calcaneus as findings may be due to an acute or chronic fracture. Electronically Signed   By: Elberta Fortis M.D.   On: 01/21/2018 20:44   Ct Foot Right Wo Contrast  Result Date: 01/23/2018 CLINICAL DATA:  Fall 2 days ago. Third and fourth metatarsal fractures. Tibial fracture ORIF yesterday. EXAM: CT OF THE RIGHT FOOT WITHOUT CONTRAST TECHNIQUE: Multidetector CT imaging of the right foot was performed according to the standard protocol. Multiplanar CT image reconstructions were also generated. COMPARISON:  Right foot x-rays dated January 21, 2018. FINDINGS: Bones/Joint/Cartilage There is a nondisplaced intra-articular fracture of the posterior malleolus. No articular surface step-off. There are acute fractures of the third and fourth metatarsal necks with slight lateral displacement and plantar angulation. There are nondisplaced fractures at the bases of the second, third,  and fourth metatarsals. There is a slightly  displaced fracture at the distal and lateral aspect of the middle cuneiform. Lisfranc alignment is maintained. There is a mildly displaced, intra-articular fracture at the medial base of the first proximal phalanx. The talar dome is intact. The ankle mortise is symmetric. Chronic, depressed fracture of the calcaneus with evidence of pseudoarticulation between the calcaneus and fibula. Old healed fracture of the distal fifth metatarsal shaft. Mild osteoarthritis of the subtalar, talonavicular, and calcaneocuboid joints. Trace tibiotalar joint effusion. Ligaments Suboptimally assessed by CT. Muscles and Tendons Prominent fatty atrophy of the intrinsic foot musculature the visualized flexor, extensor, and peroneal tendons are grossly intact. Soft tissues Moderate soft tissue swelling about the hindfoot. Mild soft tissue swelling over the dorsum of the forefoot. No drainable fluid collection. IMPRESSION: 1. Acute, nondisplaced intra-articular fracture of the posterior malleolus without articular surface step-off. 2. Acute, mildly displaced fractures of the third and fourth metatarsal necks. 3. Acute, nondisplaced fractures at the bases of the second, third, and fourth metatarsals. Lisfranc alignment is maintained. 4. Acute, minimally displaced fracture of the middle cuneiform. 5. Acute, mildly displaced fracture at the medial base of the first proximal phalanx. 6. Chronic, healed, depressed fracture of the calcaneus with evidence of resultant lateral hindfoot impingement. Electronically Signed   By: Obie Dredge M.D.   On: 01/23/2018 10:28   Chest Portable 1 View  Result Date: 01/21/2018 CLINICAL DATA:  Preoperative radiograph EXAM: PORTABLE CHEST 1 VIEW COMPARISON:  Chest radiograph 05/23/2017 FINDINGS: Unchanged cardiomegaly. No focal airspace consolidation or pulmonary edema. No pneumothorax or pleural effusion. IMPRESSION: No active disease. Electronically Signed   By: Deatra Robinson M.D.   On: 01/21/2018  23:45   Dg Foot 2 Views Right  Result Date: 01/21/2018 CLINICAL DATA:  Dorsal foot pain after fall in kitchen tonight. EXAM: RIGHT FOOT - 2 VIEW COMPARISON:  None. FINDINGS: Examination demonstrates diffuse osteopenia. There are degenerative changes over the first MTP joint. There are degenerative changes of the interphalangeal joints as well as over the mid to hindfoot region. There is a displaced transverse fracture at the base of the head of the third and fourth metatarsals. There is loss of height of the calcaneus with subtle horizontal lucency over the posterior calcaneus as this may be due to acute or chronic fracture. Small vessel atherosclerotic disease is present. IMPRESSION: Minimally displaced transverse fractures at the base of the head of the third and fourth metatarsals. Loss of height of the calcaneus with subtle horizontal lucency over the superior portion of the calcaneus which may be due to acute or chronic fracture. Degenerative changes as described. Electronically Signed   By: Elberta Fortis M.D.   On: 01/21/2018 20:47   Dg C-arm 1-60 Min  Result Date: 01/22/2018 CLINICAL DATA:  Intraoperative fixation EXAM: RIGHT TIBIA AND FIBULA - 2 VIEW; DG C-ARM 61-120 MIN COMPARISON:  January 21, 2018 FLUOROSCOPY TIME:  3 minutes 30 seconds; dose 5.5 mGy; 6 acquired images FINDINGS: Frontal and lateral views obtained. There is screw and rod fixation through a fracture of the junction of the mid and distal thirds of the tibia. Alignment is essentially anatomic postoperatively. There is a fracture of the proximal fibular diaphysis with slight lateral displacement distally. No dislocation evident. Visualized joint spaces appear unremarkable. IMPRESSION: Postoperative fixation of fracture of the junction of the mid and distal thirds of the tibia with alignment essentially anatomic. Mildly displaced fracture proximal fibula. Electronically Signed   By: Bretta Bang III M.D.  On: 01/22/2018 16:11     EKG:   Orders placed or performed during the hospital encounter of 01/21/18  . ED EKG  . ED EKG  . EKG 12-Lead  . EKG 12-Lead  . EKG    ASSESSMENT AND PLAN:   # Closed fracture of right tibia and fibula -per  primary orthopedic team for management   #   Type 2 diabetes mellitus (HCC) - patient states that he can only take Humulin R insulin and Lantus long-acting insulin.  lantus 30 units q daily  Humulin R custom ss Consulted diabetic coordinator, appreciate their recommendations  #  Essential hypertension -continue home meds benazepril and hydrochlorothiazide  #  HLD (hyperlipidemia) -Lipitor  #   Hypothyroidism -home dose thyroid replacement, patient states that he must have brand name thyroid medication-Armour Thyroid 120 mg once daily  #   GERD (gastroesophageal reflux disease) - PPI  PT recs SNF     All the records are reviewed and case discussed with Care Management/Social Workerr. Management plans discussed with the patient, family and they are in agreement.  CODE STATUS : FC   TOTAL TIME TAKING CARE OF THIS PATIENT: 33 minutes.   POSSIBLE D/C IN 1-  2  DAYS, DEPENDING ON CLINICAL CONDITION.  Note: This dictation was prepared with Dragon dictation along with smaller phrase technology. Any transcriptional errors that result from this process are unintentional.   Ramonita Lab M.D on 01/23/2018 at 2:37 PM  Between 7am to 6pm - Pager - [phone] After 6pm go to [email or web address] - password [email or web address] [facility]  [place]  [facility]  Office  [phone]  CC: Primary care physician; Lyndon Code, MD

## 2018-01-23 NOTE — Progress Notes (Signed)
Clinical Child psychotherapistocial Worker (CSW) presented bed offers to patient. He chose [facility]. Per [facility] coordinator [facility].   [employer]iley Tenishia Ekman, LCSW [phone]

## 2018-01-23 NOTE — Progress Notes (Signed)
I have reviewed the CT scan results this morning.  Given the findings of multiple metatarsal and tarsal fractures, he will be non-weightbearing on the right lower extremity for 6 weeks.

## 2018-01-23 NOTE — Progress Notes (Signed)
Patient's wife [name] is in agreement with D/C to [facility]. Clinical Education officer, museum (CSW) met with patient and his wife [name] this afternoon and made them aware that [facility] has started [employer] authorization.   [employer], LCSW [phone]

## 2018-01-23 NOTE — Progress Notes (Signed)
Physical Therapy Treatment Patient Details Name: [name] MRN: [id number] DOB: July 06, 1941 Today's Date: 01/23/2018   [name] History of Present Illness Pt is a 77 y.o.malewho presents with a diagnosis of right tibia and fibula fractures with 3rd and 4th metatarsal fractures to the right foot.Patient explains that he slipped in the kitchen at home sustaining the above-noted injuries. He was unable to weight-bear following his fall. He is brought to the [facility] Department where x-rays were taken and the above-noted fractures were confirmed.  Pt is now s/p IM nail to the R tibia.  Assessment also includes: DM II, HLD, essential HTN, hypothyroidism, and GERD.     PT Comments    Pt resting.  Stated he returned to bed about 30 minutes prior with nursing but agrees to session.  To edge of bed with supervision.  He was able to remain sitting unsupported x 15 minutes.  During sitting, attempted to stand at bedside but he was unable to stand fully and maintain NWB on RLE so standing was deferred at this time.  Pt reported general fatigue. Educated on importance of NWB for proper healing.  Voiced understanding. Participated in exercises as described below.  He required min a for LE to return to supine due to weight of cast.   Follow Up Recommendations  SNF     Equipment Recommendations  Other (comment)(TBD at next venue of care; pt will eventually require a RW which he states he has but does not know where it is)    Recommendations for Other Services       Precautions / Restrictions Precautions Precautions: Fall Restrictions Weight Bearing Restrictions: Yes RLE Weight Bearing: Non weight bearing    Mobility  Bed Mobility Overal bed mobility: Needs Assistance Bed Mobility: Supine to Sit;Sit to Supine     Supine to sit: Min guard Sit to supine: Min assist   General bed mobility comments: for LE management to return to supine  Transfers Overall transfer level:  Needs assistance Equipment used: Rolling walker (2 wheeled) Transfers: Sit to/from Stand Sit to Stand: Mod assist         General transfer comment: unable to maintain NWB in standing this session so pt returned to sitting.  Ambulation/Gait Ambulation/Gait assistance: Min assist Ambulation Distance (Feet): 2 Feet Assistive device: Rolling walker (2 wheeled)     Gait velocity interpretation: Below normal speed for age/gender General Gait Details: deferred due to fatigue and unable to maintain NWB   Stairs            Wheelchair Mobility    Modified Rankin (Stroke Patients Only)       Balance Overall balance assessment: Needs assistance   Sitting balance-Leahy Scale: Normal     Standing balance support: Bilateral upper extremity supported Standing balance-Leahy Scale: Zero Standing balance comment: Pt did well in AM per chart review but had increased difficulty this pm due to fatigue.                            Cognition Arousal/Alertness: Awake/alert Behavior During Therapy: WFL for tasks assessed/performed Overall Cognitive Status: Within Functional Limits for tasks assessed                                        Exercises Total Joint Exercises Ankle Circles/Pumps: AROM;Left;10 reps Quad Sets: Strengthening;Both;10 reps Gluteal Sets:  Strengthening;Both;10 reps Hip ABduction/ADduction: AAROM;Right;10 reps Straight Leg Raises: AAROM;Right;10 reps Long Arc Quad: AROM;Both;10 reps Knee Flexion: AROM;Both;10 reps Other Exercises Other Exercises: seated A/AAROM for LAQ and marching 2 x 10 in sitting.  Remained EOB x 15 mintues without support    General Comments        Pertinent Vitals/Pain Pain Assessment: No/denies pain    Home Living Family/patient expects to be discharged to:: Private residence Living Arrangements: Spouse/significant other Available Help at Discharge: Family;Available 24 hours/day Type of Home:  House Home Access: Stairs to enter Entrance Stairs-Rails: Left Home Layout: Laundry or work area in basement;Able to live on main level with bedroom/bathroom Home Equipment: Environmental consultant - 2 wheels;Cane - single point      Prior Function Level of Independence: Independent      Comments: Ind amb limited community distances, works in the yard, Ind with all ADLs, no other fall history   PT Goals (current goals can now be found in the care plan section) Acute Rehab PT Goals Patient Stated Goal: To be able to work in the yard again PT Goal Formulation: With patient Time For Goal Achievement: 02/05/18 Potential to Achieve Goals: Good Progress towards PT goals: Progressing toward goals    Frequency    BID      PT Plan Current plan remains appropriate    Co-evaluation              AM-PAC PT "6 Clicks" Daily Activity  Outcome Measure  Difficulty turning over in bed (including adjusting bedclothes, sheets and blankets)?: A Little Difficulty moving from lying on back to sitting on the side of the bed? : A Little Difficulty sitting down on and standing up from a chair with arms (e.g., wheelchair, bedside commode, etc,.)?: Unable Help needed moving to and from a bed to chair (including a wheelchair)?: A Lot Help needed walking in hospital room?: Total Help needed climbing 3-5 steps with a railing? : Total 6 Click Score: 11    End of Session Equipment Utilized During Treatment: Gait belt Activity Tolerance: Patient tolerated treatment well Patient left: in bed;with bed alarm set;with call bell/phone within reach Nurse Communication: Mobility status PT Visit Diagnosis: Muscle weakness (generalized) (M62.81);Other abnormalities of gait and mobility (R26.89)     Time: 1345-1400 PT Time Calculation (min) (ACUTE ONLY): 15 min  Charges:  $Therapeutic Exercise: 8-22 mins                    G Codes:       [name], PTA 01/23/18, 2:15 PM

## 2018-01-23 NOTE — Clinical Social Work Note (Signed)
Clinical Social Work Assessment  Patient Details  Name: [name] MRN: [id number] Date of Birth: 10-21-1941  Date of referral:  01/23/18               Reason for consult:  Facility Placement                Permission sought to share information with:  Chartered certified accountant granted to share information::  Yes, Verbal Permission Granted  Name::      [place]::   [place]   Relationship::     Contact Information:     Housing/Transportation Living arrangements for the past 2 months:  [place] of Information:  Patient Patient Interpreter Needed:  None Criminal Activity/Legal Involvement Pertinent to Current Situation/Hospitalization:  No - Comment as needed Significant Relationships:  Spouse Lives with:  Spouse Do you feel safe going back to the place where you live?  Yes Need for family participation in patient care:  Yes (Comment)  Care giving concerns:  Patient lives in [place] with his wife [name].    Social Worker assessment / plan:  Holiday representative (CSW) received SNF consult. PT is recommending SNF. CSW met with patient alone at bedside to discuss D/C plan. Patient was alert and oriented X4 and was laying in the bed. CSW introduced self and explained role of CSW department. Patient reported that he lives in [place] with his wife [name]. CSW explained SNF process and that [facility] will have to approve SNF. Patient is agreeable to SNF search in [place]. FL2 complete and faxed out. CSW attempted to contact patient's wife [name] however she did not answer. CSW will continue to follow and assist as needed.   Employment status:  Retired Nurse, adult PT Recommendations:  [place] / Referral to community resources:  [place]  Patient/Family's Response to care:  Patient is agreeable to [facility] in [place].    Patient/Family's Understanding of and Emotional Response to Diagnosis, Current Treatment, and Prognosis:  Patient was very pleasant and thanked CSW for assistance.   Emotional Assessment Appearance:  Appears stated age Attitude/Demeanor/Rapport:    Affect (typically observed):  Accepting, Adaptable, Pleasant Orientation:  Oriented to Self, Oriented to Place, Oriented to  Time, Oriented to Situation Alcohol / Substance use:  Not Applicable Psych involvement (Current and /or in the community):  No (Comment)  Discharge Needs  Concerns to be addressed:  Discharge Planning Concerns Readmission within the last 30 days:  No Current discharge risk:  Dependent with Mobility Barriers to Discharge:  Continued Medical Work up   [employer], Veronia Beets, LCSW 01/23/2018, 12:00 PM

## 2018-01-23 NOTE — Anesthesia Postprocedure Evaluation (Signed)
Anesthesia Post Note  Patient: Evelina BucyDonald R Mauritz  Procedure(s) Performed: INTRAMEDULLARY (IM) NAIL INTERTROCHANTRIC (Right Leg Lower)  Patient location during evaluation: Nursing Unit Anesthesia Type: Spinal Level of consciousness: awake and alert and oriented Pain management: pain level controlled Vital Signs Assessment: post-procedure vital signs reviewed and stable Respiratory status: spontaneous breathing Cardiovascular status: stable Postop Assessment: no backache, patient able to bend at knees, no apparent nausea or vomiting and adequate PO intake Anesthetic complications: no     Last Vitals:  Vitals:   01/22/18 2354 01/23/18 0446  BP: (!) 111/56 123/61  Pulse: 87 87  Resp: 17 18  Temp: 36.9 C 36.7 C  SpO2: 93% 93%    Last Pain:  Vitals:   01/23/18 0446  TempSrc: Oral  PainSc:                  Zachary GeorgeWeatherly,  Meilin Brosh F

## 2018-01-23 NOTE — Clinical Social Work Placement (Signed)
   CLINICAL SOCIAL WORK PLACEMENT  NOTE  Date:  01/23/2018  Patient Details  Name: Evelina BucyDonald R Compean MRN: [id number] Date of Birth: 1941-08-19  Clinical Social Work is seeking post-discharge placement for this patient at the Skilled  Nursing Facility level of care (*CSW will initial, date and re-position this form in  chart as items are completed):  Yes   Patient/family provided with [facility] Clinical Social Work Department's list of facilities offering this level of care within the geographic area requested by the patient (or if unable, by the patient's family).  Yes   Patient/family informed of their freedom to choose among providers that offer the needed level of care, that participate in Medicare, Medicaid or managed care program needed by the patient, have an available bed and are willing to accept the patient.  Yes   Patient/family informed of [facility]'s ownership interest in [facility] and [facility], as well as of the fact that they are under no obligation to receive care at these facilities.  PASRR submitted to EDS on       PASRR number received on       Existing PASRR number confirmed on 01/23/18     FL2 transmitted to all facilities in geographic area requested by pt/family on 01/23/18     FL2 transmitted to all facilities within larger geographic area on       Patient informed that his/her managed care company has contracts with or will negotiate with certain facilities, including the following:            Patient/family informed of bed offers received.  Patient chooses bed at       Physician recommends and patient chooses bed at      Patient to be transferred to   on  .  Patient to be transferred to facility by       Patient family notified on   of transfer.  Name of family member notified:        PHYSICIAN       Additional Comment:    _______________________________________________ Areen Trautner, Darleen CrockerBailey M, LCSW 01/23/2018, 11:59 AM

## 2018-01-24 LAB — CBC
HEMATOCRIT: 30.8 % — AB (ref 40.0–52.0)
HEMOGLOBIN: 10.4 g/dL — AB (ref 13.0–18.0)
MCH: 32 pg (ref 26.0–34.0)
MCHC: 33.8 g/dL (ref 32.0–36.0)
MCV: 94.6 fL (ref 80.0–100.0)
Platelets: 121 10*3/uL — ABNORMAL LOW (ref 150–440)
RBC: 3.26 MIL/uL — ABNORMAL LOW (ref 4.40–5.90)
RDW: 15 % — AB (ref 11.5–14.5)
WBC: 7.7 10*3/uL (ref 3.8–10.6)

## 2018-01-24 LAB — GLUCOSE, CAPILLARY
GLUCOSE-CAPILLARY: 132 mg/dL — AB (ref 65–99)
GLUCOSE-CAPILLARY: 302 mg/dL — AB (ref 65–99)
Glucose-Capillary: 151 mg/dL — ABNORMAL HIGH (ref 65–99)
Glucose-Capillary: 302 mg/dL — ABNORMAL HIGH (ref 65–99)
Glucose-Capillary: 346 mg/dL — ABNORMAL HIGH (ref 65–99)

## 2018-01-24 LAB — BASIC METABOLIC PANEL
Anion gap: 7 (ref 5–15)
BUN: 15 mg/dL (ref 6–20)
CALCIUM: 8.7 mg/dL — AB (ref 8.9–10.3)
CHLORIDE: 106 mmol/L (ref 101–111)
CO2: 29 mmol/L (ref 22–32)
Creatinine, Ser: 0.93 mg/dL (ref 0.61–1.24)
GFR calc non Af Amer: >60 mL/min (ref >60)
Glucose, Bld: 137 mg/dL — ABNORMAL HIGH (ref 65–99)
Potassium: 3.9 mmol/L (ref 3.5–5.1)
SODIUM: 142 mmol/L (ref 135–145)

## 2018-01-24 MED ORDER — DOCUSATE SODIUM 100 MG PO CAPS: 100.0000 mg | ORAL_CAPSULE | Freq: Two times a day (BID) | ORAL | 0 refills | Status: DC

## 2018-01-24 MED ORDER — ENOXAPARIN SODIUM 40 MG/0.4ML ~~LOC~~ SOLN: 40.0000 mg | SUBCUTANEOUS | 0 refills | Status: DC

## 2018-01-24 MED ORDER — TRAMADOL HCL 50 MG PO TABS: 50.0000 mg | ORAL_TABLET | Freq: Four times a day (QID) | ORAL | 0 refills | Status: DC | PRN

## 2018-01-24 NOTE — Progress Notes (Signed)
[facility] admissions coordinator at [facility] [employer] SNF authorization is still pending and they will not take a 5 day LOG. MD aware of above.   [employer] IncorporatedBailey Mikaeel Petrow, LCSW [phone]

## 2018-01-24 NOTE — Progress Notes (Signed)
Inpatient Diabetes Program Recommendations  AACE/ADA: New Consensus Statement on Inpatient Glycemic Control (2015)  Target Ranges:  Prepandial:   less than 140 mg/dL      Peak postprandial:   less than 180 mg/dL (1-2 hours)      Critically ill patients:  140 - 180 mg/dL   Results for [name], [name] (MRN [id number]) as of 01/24/2018 07:56  Ref. Range 01/23/2018 07:57 01/23/2018 11:38 01/23/2018 16:43 01/23/2018 21:06 01/24/2018 03:23  Glucose-Capillary Latest Ref Range: 65 - 99 mg/dL 409244 (H) 811277 (H) 914298 (H) 218 (H) 132 (H)   Results for [name], [name] (MRN [id number]) as of 01/24/2018 07:56  Ref. Range 12/04/2017 10:54  Hemoglobin A1C Unknown 6.5   Review of Glycemic Control Diabetes history:DM Outpatient Diabetes medications:Lantus 56 units daily in the morning, Humulin Regular 16 units BID with breakfast and supper; plus additional units for correction if needed (max dose of 30 units at one time) Current orders for Inpatient glycemic control:Humulin Regular 0-22 units ACHS, Lantus 30 units daily, Humulin Regular 5 units TID with meals   Inpatient Diabetes Program Recommendations:  Insulin - Meal Coverage: Please consider increasing meal coverage to Humulin Regular 10 units TID with meals for meal coverage if patient eats at least 50% of meals. HgbA1C: A1C was 6.5% on 12/04/2017 indicating an average glucose of 140 mg/dl.  Thanks, Orlando PennerMarie Shamari Trostel, RN, MSN, CDE Diabetes Coordinator Inpatient Diabetes Program [phone] (Team Pager from 8am to 5pm)

## 2018-01-24 NOTE — Progress Notes (Signed)
Physical Therapy Treatment Patient Details Name: [name] MRN: [id number] DOB: 08/04/1941 Today's Date: 01/24/2018    History of Present Illness Pt is a 77 y.o.malewho presents with a diagnosis of right tibia and fibula fractures with 3rd and 4th metatarsal fractures to the right foot.Patient explains that he slipped in the kitchen at home sustaining the above-noted injuries. He was unable to weight-bear following his fall. He is brought to the [facility] emergency Department where x-rays were taken and the above-noted fractures were confirmed.  Pt is now s/p IM nail to the R tibia.  Assessment also includes: DM II, HLD, essential HTN, hypothyroidism, and GERD.     PT Comments    Participated in exercises as described below.  To edge of bed with min guard but able to manage LE on his own.  Sitting balance with supervision.  Pt was able to stand well today with min a +2 with walker.  Verbal cues for hand placements.  He stood fully and was able to maintain NWB while transitioning to recliner at bedside with small hopping steps.  Good controlled sitting.  Remained up in recliner.  Discussed mobility with nurse tech.   Follow Up Recommendations  SNF     Equipment Recommendations       Recommendations for Other Services       Precautions / Restrictions Precautions Precautions: Fall Restrictions Weight Bearing Restrictions: Yes RLE Weight Bearing: Non weight bearing    Mobility  Bed Mobility Overal bed mobility: Needs Assistance Bed Mobility: Supine to Sit     Supine to sit: Min guard        Transfers Overall transfer level: Needs assistance Equipment used: Rolling walker (2 wheeled) Transfers: Sit to/from Stand Sit to Stand: Min assist;+2 physical assistance         General transfer comment: significantly improved this session but continues to require +2 for safety and balance when standing.  Ambulation/Gait Ambulation/Gait assistance: Min  assist;+2 physical assistance Ambulation Distance (Feet): 3 Feet Assistive device: Rolling walker (2 wheeled) Gait Pattern/deviations: Step-to pattern   Gait velocity interpretation: <1.8 ft/sec, indicative of risk for recurrent falls General Gait Details: did well with NWB this session.   Stairs            Wheelchair Mobility    Modified Rankin (Stroke Patients Only)       Balance Overall balance assessment: Needs assistance   Sitting balance-Leahy Scale: Normal     Standing balance support: Bilateral upper extremity supported Standing balance-Leahy Scale: Poor Standing balance comment: able to stand fully today                            Cognition Arousal/Alertness: Awake/alert Behavior During Therapy: WFL for tasks assessed/performed Overall Cognitive Status: Within Functional Limits for tasks assessed                                        Exercises Total Joint Exercises Ankle Circles/Pumps: AROM;Left;10 reps Quad Sets: Strengthening;Both;10 reps Gluteal Sets: Strengthening;Both;10 reps Hip ABduction/ADduction: AAROM;10 reps;Both;Right;AROM Straight Leg Raises: AAROM;Right;10 reps;Both;AROM    General Comments        Pertinent Vitals/Pain Pain Assessment: No/denies pain    Home Living                      Prior Function  PT Goals (current goals can now be found in the care plan section) Progress towards PT goals: Progressing toward goals    Frequency    BID      PT Plan Current plan remains appropriate    Co-evaluation              AM-PAC PT "6 Clicks" Daily Activity  Outcome Measure  Difficulty turning over in bed (including adjusting bedclothes, sheets and blankets)?: A Little Difficulty moving from lying on back to sitting on the side of the bed? : A Little Difficulty sitting down on and standing up from a chair with arms (e.g., wheelchair, bedside commode, etc,.)?:  Unable Help needed moving to and from a bed to chair (including a wheelchair)?: A Lot Help needed walking in hospital room?: A Lot Help needed climbing 3-5 steps with a railing? : Total 6 Click Score: 12    End of Session Equipment Utilized During Treatment: Gait belt Activity Tolerance: Patient tolerated treatment well Patient left: in chair;with chair alarm set;with call bell/phone within reach Nurse Communication: Mobility status       Time: [id number] PT Time Calculation (min) (ACUTE ONLY): 17 min  Charges:  $Gait Training: 8-22 mins                    G Codes:       Danielle DessSarah Abdiaziz Klahn, PTA 01/24/18, 9:14 AM

## 2018-01-24 NOTE — Progress Notes (Signed)
Subjective:  POD #2 s/p IM nailing of right tibia fracture with concomitant metatarsal fractures.  Patient reports right leg pain as mild to moderate.    Objective:   VITALS:   Vitals:   01/23/18 1107 01/23/18 1642 01/23/18 2249 01/24/18 0804  BP:  140/74 129/66 (!) 144/71  Pulse: (!) 102 87 (!) 104 (!) 103  Resp:   19 18  Temp:  98.2 F (36.8 C) 98.6 F (37 C) 97.8 F (36.6 C)  TempSrc:  Oral Oral Oral  SpO2: 94% 96% 93% 94%  Weight:      Height:        PHYSICAL EXAM: Right lower extremity:  Toes are well perfuses.   He can flex and extend his toes.   Neurovascular intact Sensation intact distally Compartment soft  LABS  Results for orders placed or performed during the hospital encounter of 01/21/18 (from the past 24 hour(s))  Glucose, capillary     Status: Abnormal   Collection Time: 01/23/18  4:43 PM  Result Value Ref Range   Glucose-Capillary 298 (H) 65 - 99 mg/dL   Comment 1 Notify RN   Glucose, capillary     Status: Abnormal   Collection Time: 01/23/18  9:06 PM  Result Value Ref Range   Glucose-Capillary 218 (H) 65 - 99 mg/dL  Glucose, capillary     Status: Abnormal   Collection Time: 01/24/18  3:23 AM  Result Value Ref Range   Glucose-Capillary 132 (H) 65 - 99 mg/dL  CBC     Status: Abnormal   Collection Time: 01/24/18  4:28 AM  Result Value Ref Range   WBC 7.7 3.8 - 10.6 K/uL   RBC 3.26 (L) 4.40 - 5.90 MIL/uL   Hemoglobin 10.4 (L) 13.0 - 18.0 g/dL   HCT 16.130.8 (L) 09.640.0 - 04.552.0 %   MCV 94.6 80.0 - 100.0 fL   MCH 32.0 26.0 - 34.0 pg   MCHC 33.8 32.0 - 36.0 g/dL   RDW 40.915.0 (H) 81.111.5 - 91.414.5 %   Platelets 121 (L) 150 - 440 K/uL  Basic metabolic panel     Status: Abnormal   Collection Time: 01/24/18  4:28 AM  Result Value Ref Range   Sodium 142 135 - 145 mmol/L   Potassium 3.9 3.5 - 5.1 mmol/L   Chloride 106 101 - 111 mmol/L   CO2 29 22 - 32 mmol/L   Glucose, Bld 137 (H) 65 - 99 mg/dL   BUN 15 6 - 20 mg/dL   Creatinine, Ser 7.820.93 0.61 - 1.24 mg/dL    Calcium 8.7 (L) 8.9 - 10.3 mg/dL   GFR calc non Af Amer >60 >60 mL/min   GFR calc Af Amer >60 >60 mL/min   Anion gap 7 5 - 15  Glucose, capillary     Status: Abnormal   Collection Time: 01/24/18  8:02 AM  Result Value Ref Range   Glucose-Capillary 151 (H) 65 - 99 mg/dL   Comment 1 Notify RN   Glucose, capillary     Status: Abnormal   Collection Time: 01/24/18 12:08 PM  Result Value Ref Range   Glucose-Capillary 302 (H) 65 - 99 mg/dL   Comment 1 Notify RN     Dg Tibia/fibula Right  Result Date: 01/22/2018 CLINICAL DATA:  Postop EXAM: RIGHT TIBIA AND FIBULA - 2 VIEW COMPARISON:  01/21/2018 FINDINGS: Interval intramedullary rod and screw fixation of the tibia across distal tibial shaft fracture with less than 1/4 bone with of lateral and posterior displacement  of distal fracture fragment, improved since prior. Acute displaced proximal fibular shaft fracture with about 1/2 bone with of lateral and anterior displacement of distal fracture fragment. IMPRESSION: 1. Interval surgical fixation of distal tibial fracture with decreased displacement 2. Acute proximal fibular fracture also with decreased displacement. Electronically Signed   By: Jasmine Pang M.D.   On: 01/22/2018 16:44   Dg Tibia/fibula Right  Result Date: 01/22/2018 CLINICAL DATA:  Intraoperative fixation EXAM: RIGHT TIBIA AND FIBULA - 2 VIEW; DG C-ARM 61-120 MIN COMPARISON:  January 21, 2018 FLUOROSCOPY TIME:  3 minutes 30 seconds; dose 5.5 mGy; 6 acquired images FINDINGS: Frontal and lateral views obtained. There is screw and rod fixation through a fracture of the junction of the mid and distal thirds of the tibia. Alignment is essentially anatomic postoperatively. There is a fracture of the proximal fibular diaphysis with slight lateral displacement distally. No dislocation evident. Visualized joint spaces appear unremarkable. IMPRESSION: Postoperative fixation of fracture of the junction of the mid and distal thirds of the tibia with  alignment essentially anatomic. Mildly displaced fracture proximal fibula. Electronically Signed   By: Bretta Bang III M.D.   On: 01/22/2018 16:11   Ct Foot Right Wo Contrast  Result Date: 01/23/2018 CLINICAL DATA:  Fall 2 days ago. Third and fourth metatarsal fractures. Tibial fracture ORIF yesterday. EXAM: CT OF THE RIGHT FOOT WITHOUT CONTRAST TECHNIQUE: Multidetector CT imaging of the right foot was performed according to the standard protocol. Multiplanar CT image reconstructions were also generated. COMPARISON:  Right foot x-rays dated January 21, 2018. FINDINGS: Bones/Joint/Cartilage There is a nondisplaced intra-articular fracture of the posterior malleolus. No articular surface step-off. There are acute fractures of the third and fourth metatarsal necks with slight lateral displacement and plantar angulation. There are nondisplaced fractures at the bases of the second, third, and fourth metatarsals. There is a slightly displaced fracture at the distal and lateral aspect of the middle cuneiform. Lisfranc alignment is maintained. There is a mildly displaced, intra-articular fracture at the medial base of the first proximal phalanx. The talar dome is intact. The ankle mortise is symmetric. Chronic, depressed fracture of the calcaneus with evidence of pseudoarticulation between the calcaneus and fibula. Old healed fracture of the distal fifth metatarsal shaft. Mild osteoarthritis of the subtalar, talonavicular, and calcaneocuboid joints. Trace tibiotalar joint effusion. Ligaments Suboptimally assessed by CT. Muscles and Tendons Prominent fatty atrophy of the intrinsic foot musculature the visualized flexor, extensor, and peroneal tendons are grossly intact. Soft tissues Moderate soft tissue swelling about the hindfoot. Mild soft tissue swelling over the dorsum of the forefoot. No drainable fluid collection. IMPRESSION: 1. Acute, nondisplaced intra-articular fracture of the posterior malleolus without  articular surface step-off. 2. Acute, mildly displaced fractures of the third and fourth metatarsal necks. 3. Acute, nondisplaced fractures at the bases of the second, third, and fourth metatarsals. Lisfranc alignment is maintained. 4. Acute, minimally displaced fracture of the middle cuneiform. 5. Acute, mildly displaced fracture at the medial base of the first proximal phalanx. 6. Chronic, healed, depressed fracture of the calcaneus with evidence of resultant lateral hindfoot impingement. Electronically Signed   By: Obie Dredge M.D.   On: 01/23/2018 10:28   Dg C-arm 1-60 Min  Result Date: 01/22/2018 CLINICAL DATA:  Intraoperative fixation EXAM: RIGHT TIBIA AND FIBULA - 2 VIEW; DG C-ARM 61-120 MIN COMPARISON:  January 21, 2018 FLUOROSCOPY TIME:  3 minutes 30 seconds; dose 5.5 mGy; 6 acquired images FINDINGS: Frontal and lateral views obtained. There is screw and  rod fixation through a fracture of the junction of the mid and distal thirds of the tibia. Alignment is essentially anatomic postoperatively. There is a fracture of the proximal fibular diaphysis with slight lateral displacement distally. No dislocation evident. Visualized joint spaces appear unremarkable. IMPRESSION: Postoperative fixation of fracture of the junction of the mid and distal thirds of the tibia with alignment essentially anatomic. Mildly displaced fracture proximal fibula. Electronically Signed   By: Bretta Bang III M.D.   On: 01/22/2018 16:11    Assessment/Plan: 2 Days Post-Op   Principal Problem:   Closed fracture of right tibia and fibula Active Problems:   Type 2 diabetes mellitus (HCC)   HLD (hyperlipidemia)   Essential hypertension   Hypothyroidism   GERD (gastroesophageal reflux disease)   Right tibial fracture  Patient doing well postop from an orthopedic standpoint. He has been cleared by medicine for discharge to rehabilitation.Patient will remain nonweightbearing on the right lower extremity given his  foot fractures in addition to his tibia fracture.he will continue physical therapy and rehabilitation. Patient is being discharged to [facility]. He will follow up with me in 7-10 days for wound check and staple removal.  He will be placed in a short leg cast at his next office visit.    [name] , MD 01/24/2018, 1:07 PM

## 2018-01-24 NOTE — Discharge Summary (Addendum)
Physician Discharge Summary  Patient ID: [name] MRN: [id number] DOB/AGE: 1941-04-07 77 y.o.  Admit date: 01/21/2018 Discharge date: 01/26/2018  Admission Diagnoses:  right tibia fracture Closed fracture of right tibia and fibula  Discharge Diagnoses:  right tibia fracture Principal Problem:   Closed fracture of right tibia and fibula Active Problems:   Type 2 diabetes mellitus (HCC)   HLD (hyperlipidemia)   Essential hypertension   Hypothyroidism   GERD (gastroesophageal reflux disease)   Right tibial fracture S/p INTRAMEDULLARY FIXATION OF RIGHT TIBIA FRACTURE Multiple metatarsal fractures of the right foot which do not require surgery  Past Medical History:  Diagnosis Date  . Agent orange exposure    "[name]"  . Arthritis    "left knee" (03/18/2014)  . Closed pelvic fracture (HCC) 03/18/2014   "multiple S/P fall w/loss of consciousness; CBG 29"  . Encephalopathy acute 08/16/2013  . GERD (gastroesophageal reflux disease)   . High cholesterol   . Hypertension   . Hypothyroidism   . Type II diabetes mellitus (HCC)     Surgeries: Procedure(s): INTRAMEDULLARY (IM) NAIL INTERTROCHANTRIC on 01/22/2018   Consultants (if any): Treatment Team:  Ramonita Lab, MD  Discharged Condition: Improved  Hospital Course: [name] is an 77 y.o. male who was admitted 01/21/2018 with a diagnosis of  right tibia fracture Closed fracture of right tibia and fibula and went to the operating room on 01/22/2018 and underwent an uncomplicated intramedullary fixation of his right tibia fracture.  Patient was placed in a splint postoperatively.  He was given perioperative antibiotics:  Anti-infectives (From admission, onward)   Start     Dose/Rate Route Frequency Ordered Stop   01/22/18 2000  clindamycin (CLEOCIN) IVPB 600 mg     600 mg 100 mL/hr over 30 Minutes Intravenous Every 6 hours 01/22/18 1637 01/23/18 0424   01/22/18 1300  clindamycin (CLEOCIN) IVPB 600 mg     600  mg 100 mL/hr over 30 Minutes Intravenous  Once 01/22/18 1251 01/22/18 1359   01/22/18 1256  clindamycin (CLEOCIN) 600 MG/50ML IVPB    Note to Pharmacy:  Dellis Filbert   : cabinet override      01/22/18 1256 01/22/18 2018   01/21/18 2330  clindamycin (CLEOCIN) IVPB 900 mg     900 mg 100 mL/hr over 30 Minutes Intravenous  Once 01/21/18 2315 01/22/18 0601    .  He was given sequential compression devices, early ambulation, and Lovenox for DVT prophylaxis.   CT scan was performed of his right foot which shows numerous metatarsal fractures which are minimally displaced and do not require surgical intervention. These fractures will be treated in his splint. Patient began physical therapy. He will remain nonweightbearing on the right lower extremity for a total of 6 weeks postop.  Patient is a history of diabetes and had good postoperative blood sugar control prior to discharge.  He benefited maximally from the hospital stay and there were no complications.    Recent vital signs:  Vitals:   01/23/18 2249 01/24/18 0804  BP: 129/66 (!) 144/71  Pulse: (!) 104 (!) 103  Resp: 19 18  Temp: 98.6 F (37 C) 97.8 F (36.6 C)  SpO2: 93% 94%    Recent laboratory studies:  Lab Results  Component Value Date   HGB 10.4 (L) 01/24/2018   HGB 10.8 (L) 01/23/2018   HGB 13.2 01/21/2018   Lab Results  Component Value Date   WBC 7.7 01/24/2018   PLT 121 (L) 01/24/2018  Lab Results  Component Value Date   INR 0.98 01/22/2018   Lab Results  Component Value Date   NA 142 01/24/2018   K 3.9 01/24/2018   CL 106 01/24/2018   CO2 29 01/24/2018   BUN 15 01/24/2018   CREATININE 0.93 01/24/2018   GLUCOSE 137 (H) 01/24/2018    Discharge Medications:   Allergies as of 01/24/2018      Reactions   Insulins Other (See Comments)   Pt states that no insulin works for him, but Lantus and Humulin R.     Penicillins Rash, Other (See Comments)   Has patient had a PCN reaction causing immediate rash,  facial/tongue/throat swelling, SOB or lightheadedness with hypotension: No Has patient had a PCN reaction causing severe rash involving mucus membranes or skin necrosis: No Has patient had a PCN reaction that required hospitalization No Has patient had a PCN reaction occurring within the last 10 years: No If all of the above answers are "NO", then may proceed with Cephalosporin use.      Medication List    TAKE these medications   aspirin EC 81 MG tablet Take 81 mg by mouth daily.   atorvastatin 40 MG tablet Commonly known as:  LIPITOR Take 40 mg by mouth daily at 6 PM.   benazepril 40 MG tablet Commonly known as:  LOTENSIN Take 40 mg by mouth daily.   Cholecalciferol 1000 units capsule Take 1,000 Units by mouth daily.   docusate sodium 100 MG capsule Commonly known as:  COLACE Take 1 capsule (100 mg total) by mouth 2 (two) times daily.   enoxaparin 40 MG/0.4ML injection Commonly known as:  LOVENOX Inject 0.4 mLs (40 mg total) into the skin daily. Start taking on:  01/25/2018   HUMULIN R 100 units/mL injection Generic drug:  insulin regular Inject 0.15-0.17 mLs (15-17 Units total) into the skin 2 (two) times daily before a meal. And per sliding scale Pt is not to get novolin at all What changed:  additional instructions   hydrochlorothiazide 25 MG tablet Commonly known as:  HYDRODIURIL Take 25 mg by mouth daily.   Insulin Glargine 100 UNIT/ML Solostar Pen Commonly known as:  LANTUS SOLOSTAR Inject 65 Units into the skin daily at 10 pm. What changed:    how much to take  when to take this   [place] Ginseng 1000 MG Tabs Take 1,000 mg by mouth daily.   Omega 3 1200 MG Caps Take 1,200 mg by mouth daily.   pantoprazole 40 MG tablet Commonly known as:  PROTONIX TAKE 1 TABLET BY MOUTH DAILY   pyridOXINE 100 MG tablet Commonly known as:  VITAMIN B-6 Take 100 mg by mouth daily.   thyroid 120 MG tablet Commonly known as:  ARMOUR Take 120 mg by mouth daily  before breakfast.   traMADol 50 MG tablet Commonly known as:  ULTRAM Take 1 tablet (50 mg total) by mouth every 6 (six) hours as needed.       Diagnostic Studies: Dg Tibia/fibula Right  Result Date: 01/22/2018 CLINICAL DATA:  Postop EXAM: RIGHT TIBIA AND FIBULA - 2 VIEW COMPARISON:  01/21/2018 FINDINGS: Interval intramedullary rod and screw fixation of the tibia across distal tibial shaft fracture with less than 1/4 bone with of lateral and posterior displacement of distal fracture fragment, improved since prior. Acute displaced proximal fibular shaft fracture with about 1/2 bone with of lateral and anterior displacement of distal fracture fragment. IMPRESSION: 1. Interval surgical fixation of distal tibial fracture with decreased  displacement 2. Acute proximal fibular fracture also with decreased displacement. Electronically Signed   By: Jasmine Pang M.D.   On: 01/22/2018 16:44   Dg Tibia/fibula Right  Result Date: 01/22/2018 CLINICAL DATA:  Intraoperative fixation EXAM: RIGHT TIBIA AND FIBULA - 2 VIEW; DG C-ARM 61-120 MIN COMPARISON:  January 21, 2018 FLUOROSCOPY TIME:  3 minutes 30 seconds; dose 5.5 mGy; 6 acquired images FINDINGS: Frontal and lateral views obtained. There is screw and rod fixation through a fracture of the junction of the mid and distal thirds of the tibia. Alignment is essentially anatomic postoperatively. There is a fracture of the proximal fibular diaphysis with slight lateral displacement distally. No dislocation evident. Visualized joint spaces appear unremarkable. IMPRESSION: Postoperative fixation of fracture of the junction of the mid and distal thirds of the tibia with alignment essentially anatomic. Mildly displaced fracture proximal fibula. Electronically Signed   By: Bretta Bang III M.D.   On: 01/22/2018 16:11   Dg Tibia/fibula Right  Result Date: 01/21/2018 CLINICAL DATA:  Fall in [place].  Right lower leg pain. EXAM: RIGHT TIBIA AND FIBULA - 2 VIEW  COMPARISON:  None. FINDINGS: There is a displaced slightly comminuted oblique fracture of the proximal fibular diaphysis with 1 shaft's with of anterior and lateral displacement of the distal fragment. There is an oblique displaced fracture of the distal tibial diaphysis with 1/2 shaft's with posterolateral displacement of the distal fragment. Mild medial and posterior angulation of the distal fragment. Degenerative changes of the knee. Significant degenerative changes of the mid and hindfoot. Loss of height of the calcaneus which may be acute or chronic. Small vessel atherosclerotic disease is present. IMPRESSION: Displaced oblique fracture of the distal tibial diaphysis and displaced oblique slightly comminuted fracture of the proximal fibular diaphysis. Loss of height of the calcaneus which may be from acute or chronic fracture. Electronically Signed   By: Elberta Fortis M.D.   On: 01/21/2018 20:42   Dg Ankle Complete Right  Result Date: 01/21/2018 CLINICAL DATA:  Fall in [place]. EXAM: RIGHT ANKLE - COMPLETE 3+ VIEW COMPARISON:  None. FINDINGS: There is a oblique displaced fracture of the distal tibial diaphysis with mild medial and posterior angulation of the distal fragment and 1/2 chest with of displacement. Ankle mortise is within normal. Loss of height of the calcaneus with subtle horizontal lucency over the posterior calcaneus on the lateral film and as these findings may be due to acute or chronic fracture. Degenerative changes of the mid to hindfoot. Small vessel atherosclerotic disease is present. IMPRESSION: Displaced oblique fracture of the distal tibial diaphysis. Loss of height of the calcaneus with subtle horizontal lucency over the posterior calcaneus as findings may be due to an acute or chronic fracture. Electronically Signed   By: Elberta Fortis M.D.   On: 01/21/2018 20:44   Ct Foot Right Wo Contrast  Result Date: 01/23/2018 CLINICAL DATA:  Fall 2 days ago. Third and fourth  metatarsal fractures. Tibial fracture ORIF yesterday. EXAM: CT OF THE RIGHT FOOT WITHOUT CONTRAST TECHNIQUE: Multidetector CT imaging of the right foot was performed according to the standard protocol. Multiplanar CT image reconstructions were also generated. COMPARISON:  Right foot x-rays dated January 21, 2018. FINDINGS: Bones/Joint/Cartilage There is a nondisplaced intra-articular fracture of the posterior malleolus. No articular surface step-off. There are acute fractures of the third and fourth metatarsal necks with slight lateral displacement and plantar angulation. There are nondisplaced fractures at the bases of the second, third, and fourth metatarsals. There is  a slightly displaced fracture at the distal and lateral aspect of the middle cuneiform. Lisfranc alignment is maintained. There is a mildly displaced, intra-articular fracture at the medial base of the first proximal phalanx. The talar dome is intact. The ankle mortise is symmetric. Chronic, depressed fracture of the calcaneus with evidence of pseudoarticulation between the calcaneus and fibula. Old healed fracture of the distal fifth metatarsal shaft. Mild osteoarthritis of the subtalar, talonavicular, and calcaneocuboid joints. Trace tibiotalar joint effusion. Ligaments Suboptimally assessed by CT. Muscles and Tendons Prominent fatty atrophy of the intrinsic foot musculature the visualized flexor, extensor, and peroneal tendons are grossly intact. Soft tissues Moderate soft tissue swelling about the hindfoot. Mild soft tissue swelling over the dorsum of the forefoot. No drainable fluid collection. IMPRESSION: 1. Acute, nondisplaced intra-articular fracture of the posterior malleolus without articular surface step-off. 2. Acute, mildly displaced fractures of the third and fourth metatarsal necks. 3. Acute, nondisplaced fractures at the bases of the second, third, and fourth metatarsals. Lisfranc alignment is maintained. 4. Acute, minimally displaced  fracture of the middle cuneiform. 5. Acute, mildly displaced fracture at the medial base of the first proximal phalanx. 6. Chronic, healed, depressed fracture of the calcaneus with evidence of resultant lateral hindfoot impingement. Electronically Signed   By: Obie Dredge M.D.   On: 01/23/2018 10:28   Chest Portable 1 View  Result Date: 01/21/2018 CLINICAL DATA:  Preoperative radiograph EXAM: PORTABLE CHEST 1 VIEW COMPARISON:  Chest radiograph 05/23/2017 FINDINGS: Unchanged cardiomegaly. No focal airspace consolidation or pulmonary edema. No pneumothorax or pleural effusion. IMPRESSION: No active disease. Electronically Signed   By: Deatra Robinson M.D.   On: 01/21/2018 23:45   Dg Foot 2 Views Right  Result Date: 01/21/2018 CLINICAL DATA:  Dorsal foot pain after fall in kitchen tonight. EXAM: RIGHT FOOT - 2 VIEW COMPARISON:  None. FINDINGS: Examination demonstrates diffuse osteopenia. There are degenerative changes over the first MTP joint. There are degenerative changes of the interphalangeal joints as well as over the mid to hindfoot region. There is a displaced transverse fracture at the base of the head of the third and fourth metatarsals. There is loss of height of the calcaneus with subtle horizontal lucency over the posterior calcaneus as this may be due to acute or chronic fracture. Small vessel atherosclerotic disease is present. IMPRESSION: Minimally displaced transverse fractures at the base of the head of the third and fourth metatarsals. Loss of height of the calcaneus with subtle horizontal lucency over the superior portion of the calcaneus which may be due to acute or chronic fracture. Degenerative changes as described. Electronically Signed   By: Elberta Fortis M.D.   On: 01/21/2018 20:47   Dg C-arm 1-60 Min  Result Date: 01/22/2018 CLINICAL DATA:  Intraoperative fixation EXAM: RIGHT TIBIA AND FIBULA - 2 VIEW; DG C-ARM 61-120 MIN COMPARISON:  January 21, 2018 FLUOROSCOPY TIME:  3 minutes  30 seconds; dose 5.5 mGy; 6 acquired images FINDINGS: Frontal and lateral views obtained. There is screw and rod fixation through a fracture of the junction of the mid and distal thirds of the tibia. Alignment is essentially anatomic postoperatively. There is a fracture of the proximal fibular diaphysis with slight lateral displacement distally. No dislocation evident. Visualized joint spaces appear unremarkable. IMPRESSION: Postoperative fixation of fracture of the junction of the mid and distal thirds of the tibia with alignment essentially anatomic. Mildly displaced fracture proximal fibula. Electronically Signed   By: Bretta Bang III M.D.   On: 01/22/2018 16:11  Disposition: Discharge disposition: 03-Skilled Nursing Facility       Discharge Instructions    Call MD / Call 911   Complete by:  As directed    If you experience chest pain or shortness of breath, CALL 911 and be transported to the hospital emergency room.  If you develope a fever above 101 F, pus (white drainage) or increased drainage or redness at the wound, or calf pain, call your surgeon's office.   Constipation Prevention   Complete by:  As directed    Drink plenty of fluids.  Prune juice may be helpful.  You may use a stool softener, such as Colace (over the counter) 100 mg twice a day.  Use MiraLax (over the counter) for constipation as needed.   Diet - low sodium heart healthy   Complete by:  As directed    Discharge instructions   Complete by:  As directed    Patient is NON WEIGHTBEARING on his RIGHT lower extremity due to his tibia fracture and foot fractures.  He may perform nonweightbearing knee ROM and lower extremity exercises.  Continue lovenox 40 mg PO daily for DVT prophylaxis.  Follow up in the office with Dr. Juanell FairlyKevin Breyah Akhter in 7-10 days.   Driving restrictions   Complete by:  As directed    No driving for 6-8 weeks   Increase activity slowly as tolerated   Complete by:  As directed    Lifting  restrictions   Complete by:  As directed    No lifting for 12 weeks      Contact information for after-discharge care    Destination    [facility] .   Service:  Skilled Nursing Contact information: [address] [place] [id number] [phone]               Signed: Juanell FairlyKRASINSKI, Danyel Tobey ,MD 01/24/2018, 1:17 PM

## 2018-01-24 NOTE — Care Management Important Message (Signed)
Important Message  Patient Details  Name: [name] MRN: [id number] Date of Birth: 10-13-1941   Medicare Important Message Given:  Yes    Olegario MessierKathy A Jonae Renshaw 01/24/2018, 11:42 AM

## 2018-01-24 NOTE — Progress Notes (Signed)
[facility] - [place] [facility]                                                                                                                                                                                  Patient Demographics   [name], is a 77 y.o. male, DOB - December 01, 1940, [id number]  Admit date - 01/21/2018   Admitting Physician Juanell FairlyKevin Krasinski, MD  Outpatient Primary MD for the patient is Lyndon CodeKhan, Fozia M, MD   LOS - 3  Subjective: Patient seen and evaluated today Is comfortable and has okay appetite Increased pain in the right lower extremity Currently has a cast to the right lower extremity and had surgery on 01/22/2018 No fever, chills Complaints of any chest pain, shortness of breath No headache dizziness and blurry vision Sugars are in good control   Review of Systems:   CONSTITUTIONAL: No documented fever. No fatigue, weakness. No weight gain, no weight loss.  EYES: No blurry or double vision.  ENT: No tinnitus. No postnasal drip. No redness of the oropharynx.  RESPIRATORY: No cough, no wheeze, no hemoptysis. No dyspnea.  CARDIOVASCULAR: No chest pain. No orthopnea. No palpitations. No syncope.  GASTROINTESTINAL: No nausea, no vomiting or diarrhea. No abdominal pain. No melena or hematochezia.  GENITOURINARY: No dysuria or hematuria.  ENDOCRINE: No polyuria or nocturia. No heat or cold intolerance.  HEMATOLOGY: No anemia. No bruising. No bleeding.  INTEGUMENTARY: No rashes. No lesions.  MUSCULOSKELETAL: Decreased right leg pain NEUROLOGIC: No numbness, tingling, or ataxia. No seizure-type activity.  PSYCHIATRIC: No anxiety. No insomnia. No ADD.    Vitals:   Vitals:   01/23/18 1107 01/23/18 1642 01/23/18 2249 01/24/18 0804  BP:  140/74 129/66 (!) 144/71  Pulse: (!) 102 87 (!) 104 (!) 103  Resp:   19 18  Temp:  98.2 F (36.8 C) 98.6 F (37 C) 97.8 F (36.6 C)  TempSrc:  Oral Oral Oral  SpO2: 94% 96% 93% 94%  Weight:      Height:         Wt Readings from Last 3 Encounters:  01/22/18 104.8 kg (231 lb)  01/02/18 107.7 kg (237 lb 6.4 oz)  11/28/17 108.5 kg (239 lb 3.2 oz)     Intake/Output Summary (Last 24 hours) at 01/24/2018 0911 Last data filed at 01/23/2018 1933 Gross per 24 hour  Intake 704 ml  Output 210 ml  Net 494 ml    Physical Exam:   GENERAL: Pleasant-appearing in no apparent distress.  HEAD, EYES, EARS, NOSE AND THROAT: Atraumatic, normocephalic. Extraocular muscles are intact. Pupils equal and reactive to light. Sclerae anicteric. No conjunctival injection. No oro-pharyngeal erythema.  NECK:  Supple. There is no jugular venous distention. No bruits, no lymphadenopathy, no thyromegaly.  HEART: Regular rate and rhythm,. No murmurs, no rubs, no clicks.  LUNGS: Clear to auscultation bilaterally. No rales or rhonchi. No wheezes.  ABDOMEN: Soft, flat, nontender, nondistended. Has good bowel sounds. No hepatosplenomegaly appreciated.  EXTREMITIES: No evidence of any cyanosis, clubbing, or peripheral edema.  +2 pedal and radial pulses bilaterally.  S/p right leg surgery with cast NEUROLOGIC: The patient is alert, awake, and oriented x3 with no focal motor or sensory deficits appreciated bilaterally.  SKIN: Moist and warm with no rashes appreciated.  Psych: Not anxious, depressed LN: No inguinal LN enlargement    Antibiotics   Anti-infectives (From admission, onward)   Start     Dose/Rate Route Frequency Ordered Stop   01/22/18 2000  clindamycin (CLEOCIN) IVPB 600 mg     600 mg 100 mL/hr over 30 Minutes Intravenous Every 6 hours 01/22/18 1637 01/23/18 0424   01/22/18 1300  clindamycin (CLEOCIN) IVPB 600 mg     600 mg 100 mL/hr over 30 Minutes Intravenous  Once 01/22/18 1251 01/22/18 1359   01/22/18 1256  clindamycin (CLEOCIN) 600 MG/50ML IVPB    Note to Pharmacy:  Dellis Filbert   : cabinet override      01/22/18 1256 01/22/18 2018   01/21/18 2330  clindamycin (CLEOCIN) IVPB 900 mg     900 mg 100  mL/hr over 30 Minutes Intravenous  Once 01/21/18 2315 01/22/18 0601      Medications   Scheduled Meds: . aspirin EC  81 mg Oral QPM  . atorvastatin  40 mg Oral QHS  . benazepril  40 mg Oral Daily  . docusate sodium  100 mg Oral BID  . enoxaparin (LOVENOX) injection  40 mg Subcutaneous Q24H  . hydrochlorothiazide  25 mg Oral Daily  . insulin glargine  30 Units Subcutaneous Daily  . insulin regular  0-22 Units Subcutaneous TID AC & HS  . insulin regular  5 Units Subcutaneous TID AC  . pantoprazole  40 mg Oral Daily  . thyroid  120 mg Oral QAC breakfast  . traMADol  50 mg Oral Q6H   Continuous Infusions: . 0.9 % NaCl with KCl 20 mEq / L 75 mL/hr at 01/23/18 1500  . methocarbamol (ROBAXIN)  IV     PRN Meds:.alum & mag hydroxide-simeth, bisacodyl, diphenhydrAMINE, HYDROmorphone (DILAUDID) injection, magnesium citrate, methocarbamol **OR** methocarbamol (ROBAXIN)  IV, ondansetron **OR** ondansetron (ZOFRAN) IV, oxyCODONE, oxyCODONE, polyethylene glycol   Data Review:   Micro Results Recent Results (from the past 240 hour(s))  Urine culture     Status: None   Collection Time: 01/22/18 12:25 AM  Result Value Ref Range Status   Specimen Description   Final    URINE, RANDOM Performed at [facility], [address] [place], [place] [address]    Special Requests   Final    NONE Performed at [facility], [address] [place], [place] [address]    Culture   Final    NO GROWTH Performed at [facility], [address] [place], [place] [address]    Report Status 01/23/2018 FINAL  Final  Surgical PCR screen     Status: None   Collection Time: 01/22/18 12:25 AM  Result Value Ref Range Status   MRSA, PCR NEGATIVE NEGATIVE Final   Staphylococcus aureus NEGATIVE NEGATIVE Final    Comment: (NOTE) The Xpert SA Assay (FDA approved for NASAL specimens in patients 22 years  of age and older), is one component of a comprehensive surveillance program. It is  not intended to diagnose infection nor to guide or monitor treatment. Performed at [facility], [address] [place], [place] [address]     Radiology Reports Dg Tibia/fibula Right  Result Date: 01/22/2018 CLINICAL DATA:  Postop EXAM: RIGHT TIBIA AND FIBULA - 2 VIEW COMPARISON:  01/21/2018 FINDINGS: Interval intramedullary rod and screw fixation of the tibia across distal tibial shaft fracture with less than 1/4 bone with of lateral and posterior displacement of distal fracture fragment, improved since prior. Acute displaced proximal fibular shaft fracture with about 1/2 bone with of lateral and anterior displacement of distal fracture fragment. IMPRESSION: 1. Interval surgical fixation of distal tibial fracture with decreased displacement 2. Acute proximal fibular fracture also with decreased displacement. Electronically Signed   By: Jasmine Pang M.D.   On: 01/22/2018 16:44   Dg Tibia/fibula Right  Result Date: 01/22/2018 CLINICAL DATA:  Intraoperative fixation EXAM: RIGHT TIBIA AND FIBULA - 2 VIEW; DG C-ARM 61-120 MIN COMPARISON:  January 21, 2018 FLUOROSCOPY TIME:  3 minutes 30 seconds; dose 5.5 mGy; 6 acquired images FINDINGS: Frontal and lateral views obtained. There is screw and rod fixation through a fracture of the junction of the mid and distal thirds of the tibia. Alignment is essentially anatomic postoperatively. There is a fracture of the proximal fibular diaphysis with slight lateral displacement distally. No dislocation evident. Visualized joint spaces appear unremarkable. IMPRESSION: Postoperative fixation of fracture of the junction of the mid and distal thirds of the tibia with alignment essentially anatomic. Mildly displaced fracture proximal fibula. Electronically Signed   By: Bretta Bang III M.D.   On: 01/22/2018 16:11   Dg Tibia/fibula Right  Result Date: 01/21/2018 CLINICAL DATA:  Fall in [place].  Right lower leg pain. EXAM: RIGHT TIBIA AND FIBULA -  2 VIEW COMPARISON:  None. FINDINGS: There is a displaced slightly comminuted oblique fracture of the proximal fibular diaphysis with 1 shaft's with of anterior and lateral displacement of the distal fragment. There is an oblique displaced fracture of the distal tibial diaphysis with 1/2 shaft's with posterolateral displacement of the distal fragment. Mild medial and posterior angulation of the distal fragment. Degenerative changes of the knee. Significant degenerative changes of the mid and hindfoot. Loss of height of the calcaneus which may be acute or chronic. Small vessel atherosclerotic disease is present. IMPRESSION: Displaced oblique fracture of the distal tibial diaphysis and displaced oblique slightly comminuted fracture of the proximal fibular diaphysis. Loss of height of the calcaneus which may be from acute or chronic fracture. Electronically Signed   By: Elberta Fortis M.D.   On: 01/21/2018 20:42   Dg Ankle Complete Right  Result Date: 01/21/2018 CLINICAL DATA:  Fall in [place]. EXAM: RIGHT ANKLE - COMPLETE 3+ VIEW COMPARISON:  None. FINDINGS: There is a oblique displaced fracture of the distal tibial diaphysis with mild medial and posterior angulation of the distal fragment and 1/2 chest with of displacement. Ankle mortise is within normal. Loss of height of the calcaneus with subtle horizontal lucency over the posterior calcaneus on the lateral film and as these findings may be due to acute or chronic fracture. Degenerative changes of the mid to hindfoot. Small vessel atherosclerotic disease is present. IMPRESSION: Displaced oblique fracture of the distal tibial diaphysis. Loss of height of the calcaneus with subtle horizontal lucency over the posterior calcaneus as findings may be due to an acute or chronic fracture. Electronically  Signed   By: Elberta Fortis M.D.   On: 01/21/2018 20:44   Ct Foot Right Wo Contrast  Result Date: 01/23/2018 CLINICAL DATA:  Fall 2 days ago. Third and fourth  metatarsal fractures. Tibial fracture ORIF yesterday. EXAM: CT OF THE RIGHT FOOT WITHOUT CONTRAST TECHNIQUE: Multidetector CT imaging of the right foot was performed according to the standard protocol. Multiplanar CT image reconstructions were also generated. COMPARISON:  Right foot x-rays dated January 21, 2018. FINDINGS: Bones/Joint/Cartilage There is a nondisplaced intra-articular fracture of the posterior malleolus. No articular surface step-off. There are acute fractures of the third and fourth metatarsal necks with slight lateral displacement and plantar angulation. There are nondisplaced fractures at the bases of the second, third, and fourth metatarsals. There is a slightly displaced fracture at the distal and lateral aspect of the middle cuneiform. Lisfranc alignment is maintained. There is a mildly displaced, intra-articular fracture at the medial base of the first proximal phalanx. The talar dome is intact. The ankle mortise is symmetric. Chronic, depressed fracture of the calcaneus with evidence of pseudoarticulation between the calcaneus and fibula. Old healed fracture of the distal fifth metatarsal shaft. Mild osteoarthritis of the subtalar, talonavicular, and calcaneocuboid joints. Trace tibiotalar joint effusion. Ligaments Suboptimally assessed by CT. Muscles and Tendons Prominent fatty atrophy of the intrinsic foot musculature the visualized flexor, extensor, and peroneal tendons are grossly intact. Soft tissues Moderate soft tissue swelling about the hindfoot. Mild soft tissue swelling over the dorsum of the forefoot. No drainable fluid collection. IMPRESSION: 1. Acute, nondisplaced intra-articular fracture of the posterior malleolus without articular surface step-off. 2. Acute, mildly displaced fractures of the third and fourth metatarsal necks. 3. Acute, nondisplaced fractures at the bases of the second, third, and fourth metatarsals. Lisfranc alignment is maintained. 4. Acute, minimally displaced  fracture of the middle cuneiform. 5. Acute, mildly displaced fracture at the medial base of the first proximal phalanx. 6. Chronic, healed, depressed fracture of the calcaneus with evidence of resultant lateral hindfoot impingement. Electronically Signed   By: Obie Dredge M.D.   On: 01/23/2018 10:28   Chest Portable 1 View  Result Date: 01/21/2018 CLINICAL DATA:  Preoperative radiograph EXAM: PORTABLE CHEST 1 VIEW COMPARISON:  Chest radiograph 05/23/2017 FINDINGS: Unchanged cardiomegaly. No focal airspace consolidation or pulmonary edema. No pneumothorax or pleural effusion. IMPRESSION: No active disease. Electronically Signed   By: Deatra Robinson M.D.   On: 01/21/2018 23:45   Dg Foot 2 Views Right  Result Date: 01/21/2018 CLINICAL DATA:  Dorsal foot pain after fall in kitchen tonight. EXAM: RIGHT FOOT - 2 VIEW COMPARISON:  None. FINDINGS: Examination demonstrates diffuse osteopenia. There are degenerative changes over the first MTP joint. There are degenerative changes of the interphalangeal joints as well as over the mid to hindfoot region. There is a displaced transverse fracture at the base of the head of the third and fourth metatarsals. There is loss of height of the calcaneus with subtle horizontal lucency over the posterior calcaneus as this may be due to acute or chronic fracture. Small vessel atherosclerotic disease is present. IMPRESSION: Minimally displaced transverse fractures at the base of the head of the third and fourth metatarsals. Loss of height of the calcaneus with subtle horizontal lucency over the superior portion of the calcaneus which may be due to acute or chronic fracture. Degenerative changes as described. Electronically Signed   By: Elberta Fortis M.D.   On: 01/21/2018 20:47   Dg C-arm 1-60 Min  Result Date: 01/22/2018 CLINICAL DATA:  Intraoperative fixation EXAM: RIGHT TIBIA AND FIBULA - 2 VIEW; DG C-ARM 61-120 MIN COMPARISON:  January 21, 2018 FLUOROSCOPY TIME:  3 minutes  30 seconds; dose 5.5 mGy; 6 acquired images FINDINGS: Frontal and lateral views obtained. There is screw and rod fixation through a fracture of the junction of the mid and distal thirds of the tibia. Alignment is essentially anatomic postoperatively. There is a fracture of the proximal fibular diaphysis with slight lateral displacement distally. No dislocation evident. Visualized joint spaces appear unremarkable. IMPRESSION: Postoperative fixation of fracture of the junction of the mid and distal thirds of the tibia with alignment essentially anatomic. Mildly displaced fracture proximal fibula. Electronically Signed   By: Bretta Bang III M.D.   On: 01/22/2018 16:11     CBC Recent Labs  Lab 01/21/18 1921 01/23/18 0456 01/24/18 0428  WBC 7.2 7.5 7.7  HGB 13.2 10.8* 10.4*  HCT 39.7* 31.9* 30.8*  PLT 155 123* 121*  MCV 94.6 95.1 94.6  MCH 31.5 32.1 32.0  MCHC 33.3 33.8 33.8  RDW 15.4* 15.0* 15.0*    Chemistries  Recent Labs  Lab 01/21/18 1921 01/23/18 0456 01/24/18 0428  NA 140 140 142  K 3.8 4.1 3.9  CL 105 107 106  CO2 22 26 29   GLUCOSE 183* 226* 137*  BUN 17 18 15   CREATININE 1.02 1.05 0.93  CALCIUM 8.9 8.2* 8.7*   ------------------------------------------------------------------------------------------------------------------ estimated creatinine clearance is 80.6 mL/min (by C-G formula based on SCr of 0.93 mg/dL). ------------------------------------------------------------------------------------------------------------------ No results for input(s): HGBA1C in the last 72 hours. ------------------------------------------------------------------------------------------------------------------ No results for input(s): CHOL, HDL, LDLCALC, TRIG, CHOLHDL, LDLDIRECT in the last 72 hours. ------------------------------------------------------------------------------------------------------------------ No results for input(s): TSH, T4TOTAL, T3FREE, THYROIDAB in the last 72  hours.  Invalid input(s): FREET3 ------------------------------------------------------------------------------------------------------------------ No results for input(s): VITAMINB12, FOLATE, FERRITIN, TIBC, IRON, RETICCTPCT in the last 72 hours.  Coagulation profile Recent Labs  Lab 01/22/18 0014  INR 0.98    No results for input(s): DDIMER in the last 72 hours.  Cardiac Enzymes No results for input(s): CKMB, TROPONINI, MYOGLOBIN in the last 168 hours.  Invalid input(s): CK ------------------------------------------------------------------------------------------------------------------ Invalid input(s): POCBNP    Assessment & Plan   1. Closed fracture of right tibia and fibula -Management as per  primary orthopedic team  S/p ORIF and repair Weight bearing as per Ortho recommendations   2. Type 2 diabetes mellitus (HCC) - Humulin R sliding sclae lantus 30 units q daily  Continue Humulin R before meals three times daily currently on 5 units Consulted diabetic coordinator, appreciate their recommendations  3. Essential hypertension -continue home meds benazepril and hydrochlorothiazide  4.HLD (hyperlipidemia) -Lipitor to continue  5. Hypothyroidism -[place] Thyroid 120 mg once daily  6. GERD (gastroesophageal reflux disease) - PPI  7. SNF placement as recommended by PT once ok with orthopedics  8. DVT prophylaxis with 40 mg lovenox [place] daily    Code Status History    Date Active Date Inactive Code Status Order ID Comments User Context   01/21/2018 2315 01/22/2018 1636 Full Code [id number]  Juanell Fairly, MD ED   05/23/2017 2028 05/24/2017 1809 Full Code [id number]  Alford Highland, MD ED   05/05/2016 1553 05/05/2016 1855 Full Code [id number]  Kennedy Bucker, MD Inpatient   03/18/2014 2250 03/21/2014 1940 Full Code [id number]  Eduard Clos, MD Inpatient   08/14/2013 1539 08/20/2013 1741 Full Code [id number]  Lorretta Harp, MD Inpatient    Advance  Directive Documentation     Most Recent Value  Type of  Customer service manager Power of Attorney, Living will  Pre-existing out of facility DNR order (yellow form or pink MOST form)  -  "MOST" Form in Place?  -     Time Spent in minutes   32 minutes  Greater than 50% of time spent in care coordination and counseling patient regarding the condition and plan of care.   Ihor Austin M.D on 01/24/2018 at 9:11 AM  Between 7am to 6pm - Pager - [phone]  After 6pm go to [email or web address] - Social research officer, government  [employer]   Office  [phone]

## 2018-01-24 NOTE — Evaluation (Signed)
Occupational Therapy Evaluation Patient Details Name: [name] MRN: [id number] DOB: November 19, 1940 Today's Date: 01/24/2018    History of Present Illness Pt is a 77 y.o.malewho presents with a diagnosis of right tibia and fibula fractures with 3rd and 4th metatarsal fractures to the right foot.Patient explains that he slipped in the kitchen at home sustaining the above-noted injuries. He was unable to weight-bear following his fall. He is brought [facility] where x-rays were taken and the above-noted fractures were confirmed.  Pt is now s/p IM nail to the R tibia.  Assessment also includes: DM II, HLD, essential HTN, hypothyroidism, and GERD.    Clinical Impression   Pt seen for OT evaluation this date. Pt was independent prior to admission with no falls history and enjoys caring for his 6 classic cars and 20 acres of land at home. Pt currently presents with deficits in strength/ROM in RLE, is currently non-weightbearing, and requires verbal cues to maintain and physical assist for mobility and self care tasks. Pt will benefit from skilled OT services to address noted impairments and functional deficits in order to maximize safety and return to PLOF. Recommend transition to STR following hospitalization.     Follow Up Recommendations  SNF    Equipment Recommendations  3 in 1 bedside commode    Recommendations for Other Services       Precautions / Restrictions Precautions Precautions: Fall Restrictions Weight Bearing Restrictions: Yes RLE Weight Bearing: Non weight bearing      Mobility Bed Mobility Overal bed mobility: Needs Assistance      General bed mobility comments: deferred, up in recliner for session  Transfers Overall transfer level: Needs assistance          General transfer comment: deferred, pt just recently up with PT, reports fatigue    Balance Overall balance assessment: Needs assistance   Sitting  balance-Leahy Scale: Normal                                  ADL either performed or assessed with clinical judgement   ADL Overall ADL's : Needs assistance/impaired Eating/Feeding: Sitting;Independent   Grooming: Sitting;Independent   Upper Body Bathing: Sitting;Minimal assistance   Lower Body Bathing: Maximal assistance;Sit to/from stand   Upper Body Dressing : Sitting;Minimal assistance   Lower Body Dressing: Sit to/from stand;Maximal assistance   Toilet Transfer: Stand-pivot;BSC;RW;+2 for safety/equipment;Minimal assistance;+2 for physical assistance                   Vision Baseline Vision/History: Wears glasses Wears Glasses: At all times Patient Visual Report: No change from baseline       Perception     Praxis      Pertinent Vitals/Pain Pain Assessment: No/denies pain     Hand Dominance     Extremity/Trunk Assessment Upper Extremity Assessment Upper Extremity Assessment: Overall WFL for tasks assessed   Lower Extremity Assessment Lower Extremity Assessment: Defer to PT evaluation;Generalized weakness;RLE deficits/detail   Cervical / Trunk Assessment Cervical / Trunk Assessment: Normal   Communication Communication Communication: HOH   Cognition Arousal/Alertness: Awake/alert Behavior During Therapy: WFL for tasks assessed/performed Overall Cognitive Status: Within Functional Limits for tasks assessed                                     General Comments  Exercises Other Exercises Other Exercises: Pt notes he was barefoot when he spilled and fell in the kitchen, leading to this admission. Pt instructed in falls prevention strategies; pt verbalized understanding.   Shoulder Instructions      Home Living Family/patient expects to be discharged to:: Private residence Living Arrangements: Spouse/significant other Available Help at Discharge: Family;Available 24 hours/day Type of Home: House Home Access:  Stairs to enter [employer] of Steps: 2 Entrance Stairs-Rails: Left Home Layout: Laundry or work area in basement;Able to live on main level with bedroom/bathroom     Bathroom Shower/Tub: Chief Strategy Officer: Standard(had toilet riser but it broke)     Home Equipment: Environmental consultant - 2 wheels;Cane - single point          Prior Functioning/Environment Level of Independence: Independent        Comments: Ind amb limited community distances, works in the yard (20 acres), has 6 classic cars that he enjoys caring for, Ind with all ADLs, no other fall history        OT Problem List: Decreased strength;Decreased knowledge of use of DME or AE;Decreased range of motion;Decreased knowledge of precautions;Impaired balance (sitting and/or standing)      OT Treatment/Interventions: Self-care/ADL training;Balance training;Therapeutic exercise;Therapeutic activities;DME and/or AE instruction;Patient/family education    OT Goals(Current goals can be found in the care plan section) Acute Rehab OT Goals Patient Stated Goal: To be able to work on his classic cars and work in the yard again OT Goal Formulation: With patient Time For Goal Achievement: 02/07/18 Potential to Achieve Goals: Good  OT Frequency: Min 1X/week   Barriers to D/C:            Co-evaluation              AM-PAC PT "6 Clicks" Daily Activity     Outcome Measure Help from another person eating meals?: None Help from another person taking care of personal grooming?: None Help from another person toileting, which includes using toliet, bedpan, or urinal?: A Lot Help from another person bathing (including washing, rinsing, drying)?: A Lot Help from another person to put on and taking off regular upper body clothing?: A Little Help from another person to put on and taking off regular lower body clothing?: A Lot 6 Click Score: 17   End of Session    Activity Tolerance: Patient tolerated treatment  well Patient left: in chair;with call bell/phone within reach;with chair alarm set;with family/visitor present  OT Visit Diagnosis: Other abnormalities of gait and mobility (R26.89);History of falling (Z91.81);Muscle weakness (generalized) (M62.81)                Time: 4098-1191 OT Time Calculation (min): 15 min Charges:  OT General Charges $OT Visit: 1 Visit OT Evaluation $OT Eval Low Complexity: 1 Low  Richrd Prime, MPH, MS, OTR/L ascom [phone] 01/24/18, 10:30 AM

## 2018-01-25 LAB — GLUCOSE, CAPILLARY
GLUCOSE-CAPILLARY: 195 mg/dL — AB (ref 65–99)
Glucose-Capillary: 98 mg/dL (ref 65–99)
Glucose-Capillary: 384 mg/dL — ABNORMAL HIGH (ref 65–99)
Glucose-Capillary: 350 mg/dL — ABNORMAL HIGH (ref 65–99)
Glucose-Capillary: 146 mg/dL — ABNORMAL HIGH (ref 65–99)

## 2018-01-25 LAB — CBC
HCT: 30.8 % — ABNORMAL LOW (ref 40.0–52.0)
Hemoglobin: 10.6 g/dL — ABNORMAL LOW (ref 13.0–18.0)
MCH: 32.4 pg (ref 26.0–34.0)
MCHC: 34.3 g/dL (ref 32.0–36.0)
MCV: 94.5 fL (ref 80.0–100.0)
Platelets: 124 10*3/uL — ABNORMAL LOW (ref 150–440)
RBC: 3.26 MIL/uL — ABNORMAL LOW (ref 4.40–5.90)
RDW: 15.1 % — ABNORMAL HIGH (ref 11.5–14.5)
WBC: 7.7 10*3/uL (ref 3.8–10.6)

## 2018-01-25 MED ORDER — INSULIN GLARGINE 100 UNIT/ML ~~LOC~~ SOLN
35.0000 [IU] | Freq: Every day | SUBCUTANEOUS | Status: DC
  Administered 2018-01-26: 35 [IU] via SUBCUTANEOUS
  Filled 2018-01-25 (×2): qty 0.35

## 2018-01-25 MED ORDER — INSULIN REGULAR HUMAN 100 UNIT/ML IJ SOLN
10.0000 [IU] | Freq: Three times a day (TID) | INTRAMUSCULAR | Status: DC
  Administered 2018-01-25 – 2018-01-26 (×3): 10 [IU] via SUBCUTANEOUS

## 2018-01-25 NOTE — Progress Notes (Signed)
  Subjective:  POD # 3 s/p IM fixation of right tibia fracture.  Patient reports right knee pain as mild.    Objective:   VITALS:   Vitals:   01/24/18 1551 01/24/18 2346 01/25/18 0800 01/25/18 1651  BP: 126/66 115/67 130/87 133/72  Pulse: (!) 101 97 98 86  Resp: 19 19 18    Temp: 98.9 F (37.2 C) 99.1 F (37.3 C) 98.7 F (37.1 C) 99.2 F (37.3 C)  TempSrc: Oral Oral Oral Oral  SpO2: 93% (!) 89% 95% 97%  Weight:      Height:        PHYSICAL EXAM: Right lower extremity: Neurovascular intact Sensation intact distally Intact pulses distally Dorsiflexion/Plantar flexion intact Incision: no drainage Compartment soft  LABS  Results for orders placed or performed during the hospital encounter of 01/21/18 (from the past 24 hour(s))  Glucose, capillary     Status: Abnormal   Collection Time: 01/24/18  9:13 PM  Result Value Ref Range   Glucose-Capillary 302 (H) 65 - 99 mg/dL  Glucose, capillary     Status: None   Collection Time: 01/25/18  3:09 AM  Result Value Ref Range   Glucose-Capillary 98 65 - 99 mg/dL   Comment 1 Notify RN   CBC     Status: Abnormal   Collection Time: 01/25/18  4:59 AM  Result Value Ref Range   WBC 7.7 3.8 - 10.6 K/uL   RBC 3.26 (L) 4.40 - 5.90 MIL/uL   Hemoglobin 10.6 (L) 13.0 - 18.0 g/dL   HCT 16.130.8 (L) 09.640.0 - 04.552.0 %   MCV 94.5 80.0 - 100.0 fL   MCH 32.4 26.0 - 34.0 pg   MCHC 34.3 32.0 - 36.0 g/dL   RDW 40.915.1 (H) 81.111.5 - 91.414.5 %   Platelets 124 (L) 150 - 440 K/uL  Glucose, capillary     Status: Abnormal   Collection Time: 01/25/18  7:36 AM  Result Value Ref Range   Glucose-Capillary 195 (H) 65 - 99 mg/dL   Comment 1 Notify RN   Glucose, capillary     Status: Abnormal   Collection Time: 01/25/18 12:03 PM  Result Value Ref Range   Glucose-Capillary 384 (H) 65 - 99 mg/dL   Comment 1 Notify RN   Glucose, capillary     Status: Abnormal   Collection Time: 01/25/18  4:50 PM  Result Value Ref Range   Glucose-Capillary 350 (H) 65 - 99 mg/dL   Comment 1 Notify RN     No results found.  Assessment/Plan: 3 Days Post-Op   Principal Problem:   Closed fracture of right tibia and fibula Active Problems:   Type 2 diabetes mellitus (HCC)   HLD (hyperlipidemia)   Essential hypertension   Hypothyroidism   GERD (gastroesophageal reflux disease)   Right tibial fracture  Continue PT and lovenox.   Patient awaiting insurance approval for SNF.  Possible discharge tomorrow.  Doing well from an orthopaedic standpoint.    Juanell FairlyKRASINSKI, Esme Durkin , MD 01/25/2018, 5:54 PM

## 2018-01-25 NOTE — Progress Notes (Signed)
[facility] [employer] [facility] authorization is still pending and their administrator said no to LOG. Clinical Child psychotherapistocial Worker (CSW) contacted [employer] to try to speed up the authorization process. [facility] the Wellsite geologistmedical director has not reviewed the case yet. Per case manager a decision from [facility] will be made by tomorrow. CSW made patient and his wife aware of above. RN aware of above.   Baker Hughes IncorporatedBailey Abagael Kramm, LCSW [phone]

## 2018-01-25 NOTE — Progress Notes (Signed)
Physical Therapy Treatment Patient Details Name: [name] MRN: [id number] DOB: 1941/01/20 Today's Date: 01/25/2018    History of Present Illness Pt is a 77 y.o.malewho presents with a diagnosis of right tibia and fibula fractures with 3rd and 4th metatarsal fractures to the right foot.Patient explains that he slipped in the kitchen at home sustaining the above-noted injuries. He was unable to weight-bear following his fall. [facility] where x-rays were taken and the above-noted fractures were confirmed.  Pt is now s/p IM nail to the R tibia.  Assessment also includes: DM II, HLD, essential HTN, hypothyroidism, and GERD.     PT Comments    Participated in exercises as described below..  Stood with +2 assist from bed for balance and safety.  Min/mod a x 2 to stand and has difficulty coming to full standing but once up does well.  He was able to transfer to recliner and ability to maintain NWB RLE.  He was able to increase standing time this am statically and for ex.  Overall progressing well but remains significantly limited with NWB and weight of cast.   Follow Up Recommendations  SNF     Equipment Recommendations       Recommendations for Other Services       Precautions / Restrictions Precautions Precautions: Fall Restrictions Weight Bearing Restrictions: Yes RLE Weight Bearing: Non weight bearing    Mobility  Bed Mobility Overal bed mobility: Needs Assistance Bed Mobility: Supine to Sit     Supine to sit: Min guard        Transfers Overall transfer level: Needs assistance Equipment used: Rolling walker (2 wheeled) Transfers: Sit to/from Stand Sit to Stand: Min assist;+2 physical assistance;Mod assist         General transfer comment: some increased assist this am intially  Ambulation/Gait Ambulation/Gait assistance: Min assist;+2 physical assistance Ambulation Distance (Feet): 3 Feet Assistive  device: Rolling walker (2 wheeled) Gait Pattern/deviations: Step-to pattern   Gait velocity interpretation: <1.8 ft/sec, indicative of risk for recurrent falls General Gait Details: did well with NWB this session.   Stairs            Wheelchair Mobility    Modified Rankin (Stroke Patients Only)       Balance Overall balance assessment: Needs assistance   Sitting balance-Leahy Scale: Normal     Standing balance support: Bilateral upper extremity supported Standing balance-Leahy Scale: Poor Standing balance comment: able to stand fully today, relies heavily on RW for support, unable to take hand off walker and maintain standing.                            Cognition Arousal/Alertness: Awake/alert Behavior During Therapy: WFL for tasks assessed/performed Overall Cognitive Status: Within Functional Limits for tasks assessed                                        Exercises Total Joint Exercises Ankle Circles/Pumps: AROM;Left;10 reps Quad Sets: Strengthening;Both;10 reps Gluteal Sets: Strengthening;Both;10 reps Hip ABduction/ADduction: AAROM;10 reps;Both;Right;AROM Straight Leg Raises: AAROM;Right;10 reps;Both;AROM Long Arc Quad: AROM;Both;10 reps Knee Flexion: AROM;Both;10 reps Marching in Standing: AROM;Right;10 reps Other Exercises Other Exercises: remained standing x 3 minutes statically and during standing hip/kee flexion and SLR x 10.    General Comments        Pertinent  Vitals/Pain Pain Assessment: No/denies pain    Home Living                      Prior Function            PT Goals (current goals can now be found in the care plan section) Progress towards PT goals: Progressing toward goals    Frequency    BID      PT Plan Current plan remains appropriate    Co-evaluation              AM-PAC PT "6 Clicks" Daily Activity  Outcome Measure  Difficulty turning over in bed (including adjusting  bedclothes, sheets and blankets)?: A Little Difficulty moving from lying on back to sitting on the side of the bed? : A Little Difficulty sitting down on and standing up from a chair with arms (e.g., wheelchair, bedside commode, etc,.)?: Unable Help needed moving to and from a bed to chair (including a wheelchair)?: A Lot Help needed walking in hospital room?: A Lot Help needed climbing 3-5 steps with a railing? : Total 6 Click Score: 12    End of Session Equipment Utilized During Treatment: Gait belt Activity Tolerance: Patient tolerated treatment well Patient left: in chair;with chair alarm set;with call bell/phone within reach         Time: 0913-0932 PT Time Calculation (min) (ACUTE ONLY): 19 min  Charges:  $Therapeutic Exercise: 8-22 mins                    G Codes:       Danielle Dess, PTA 01/25/18, 10:00 AM

## 2018-01-25 NOTE — Progress Notes (Signed)
Physical Therapy Treatment Patient Details Name: [name] MRN: [id number] DOB: Sep 06, 1941 Today's Date: 01/25/2018    History of Present Illness Pt is a 77 y.o.malewho presents with a diagnosis of right tibia and fibula fractures with 3rd and 4th metatarsal fractures to the right foot.Patient explains that he slipped in the kitchen at home sustaining the above-noted injuries. He was unable to weight-bear following his fall. He is brought to the [facility] emergency Department where x-rays were taken and the above-noted fractures were confirmed.  Pt is now s/p IM nail to the R tibia.  Assessment also includes: DM II, HLD, essential HTN, hypothyroidism, and GERD.     PT Comments    Pt agreeable to PT; denies pain. Pt participates in supine bed exercises with assist as needed on the R. Pt encouraged to perform exercises he can perform independently throughout the day for low level strengthening. Pt demonstrates no problems with LLE range/movement and states he has been exercising LLE. Continue PT to progress strength, endurance to improve functional mobility with decreasing assist and greater ability to maintain R NWB.    Follow Up Recommendations  SNF     Equipment Recommendations  Other (comment)(TBD at next venue)    Recommendations for Other Services       Precautions / Restrictions Precautions Precautions: Fall Restrictions Weight Bearing Restrictions: Yes RLE Weight Bearing: Non weight bearing    Mobility  Bed Mobility                  Transfers                    Ambulation/Gait                 Stairs            Wheelchair Mobility    Modified Rankin (Stroke Patients Only)       Balance                                            Cognition Arousal/Alertness: Awake/alert Behavior During Therapy: WFL for tasks assessed/performed Overall Cognitive Status: Within Functional Limits for tasks  assessed                                        Exercises General Exercises - Lower Extremity Ankle Circles/Pumps: AROM;Left;20 reps;Supine Quad Sets: Strengthening;Both;15 reps;Supine Short Arc Quad: AROM;Strengthening;Right;15 reps;Supine Heel Slides: AAROM;Right;15 reps;Supine Hip ABduction/ADduction: AAROM;Right;15 reps;Supine Straight Leg Raises: Strengthening;Right;10 reps;Supine(Lag in knee; additional 5x AAROM)    General Comments        Pertinent Vitals/Pain Pain Assessment: No/denies pain    Home Living                      Prior Function            PT Goals (current goals can now be found in the care plan section) Progress towards PT goals: Progressing toward goals    Frequency    BID      PT Plan Current plan remains appropriate    Co-evaluation              AM-PAC PT "6 Clicks" Daily Activity  Outcome Measure  Difficulty turning over in bed (including adjusting bedclothes, sheets  and blankets)?: A Little Difficulty moving from lying on back to sitting on the side of the bed? : Unable Difficulty sitting down on and standing up from a chair with arms (e.g., wheelchair, bedside commode, etc,.)?: Unable Help needed moving to and from a bed to chair (including a wheelchair)?: A Lot Help needed walking in hospital room?: A Lot Help needed climbing 3-5 steps with a railing? : Total 6 Click Score: 10    End of Session   Activity Tolerance: Patient tolerated treatment well;Other (comment)(reports mild fatigue) Patient left: in bed;with call bell/phone within reach;with bed alarm set   PT Visit Diagnosis: Muscle weakness (generalized) (M62.81);Other abnormalities of gait and mobility (R26.89)     Time: 4098-11911707-1725 PT Time Calculation (min) (ACUTE ONLY): 18 min  Charges:  $Therapeutic Exercise: 8-22 mins                    G Codes:       Scot DockHeidi E Barnes, PTA 01/25/2018, 5:32 PM

## 2018-01-25 NOTE — Progress Notes (Signed)
Inpatient Diabetes Program Recommendations  AACE/ADA: New Consensus Statement on Inpatient Glycemic Control (2015)  Target Ranges:  Prepandial:   less than 140 mg/dL      Peak postprandial:   less than 180 mg/dL (1-2 hours)      Critically ill patients:  140 - 180 mg/dL   Results for Evelina BucyDODSON, Prince R (MRN [id number]) as of 01/25/2018 09:57  Ref. Range 01/24/2018 08:02 01/24/2018 12:08 01/24/2018 15:56 01/24/2018 21:13  Glucose-Capillary Latest Ref Range: 65 - 99 mg/dL 132151 (H)  9 units Regular 302 (H)  15 units Regular 346 (H)  20 units Regular 302 (H)  10 units Regular   Results for Evelina BucyDODSON, Aristotelis R (MRN [id number]) as of 01/25/2018 09:57  Ref. Range 01/25/2018 07:36  Glucose-Capillary Latest Ref Range: 65 - 99 mg/dL 366195 (H)    Home DM Meds: Lantus 56 units daily       Regular Insulin 16 units BID (Breakfast and Dinner)       Regular SSI if needed  Current Insulin Orders: Lantus 30 units daily      Regular 0-22 units TID AC + HS      Regular 5 units TID with meals     MD- Please consider the following in-hospital insulin adjustments if patient not discharged to SNF today:  1. Increase Lantus to 35 units daily  2. Increase Regular scheduled meal coverage insulin to: Regular 10 units TID with meals       --Will follow patient during hospitalization--  Ambrose FinlandJeannine Johnston Tam Savoia RN, MSN, CDE Diabetes Coordinator Inpatient Glycemic Control Team Team Pager: [phone] (8a-5p)

## 2018-01-25 NOTE — Progress Notes (Addendum)
[facility] - [place] [facility]                                                                                                                                                                                  Patient Demographics   [name], is a 77 y.o. male, DOB - 1940-11-19, JYN:[id number]  Admit date - 01/21/2018   Admitting Physician Juanell Fairly, MD  Outpatient Primary MD for the patient is Lyndon Code, MD   LOS - 4  Subjective: Patient seen and evaluated today Is comfortable and has good appetite Decreased pain in the right lower extremity Currently has a cast to the right lower extremity and had surgery on 01/22/2018 No fever, chills No Complaints of any chest pain, shortness of breath No headache dizziness and blurry vision   Review of Systems:   CONSTITUTIONAL: No documented fever. No fatigue, weakness. No weight gain, no weight loss.  EYES: No blurry or double vision.  ENT: No tinnitus. No postnasal drip. No redness of the oropharynx.  RESPIRATORY: No cough, no wheeze, no hemoptysis. No dyspnea.  CARDIOVASCULAR: No chest pain. No orthopnea. No palpitations. No syncope.  GASTROINTESTINAL: No nausea, no vomiting or diarrhea. No abdominal pain. No melena or hematochezia.  GENITOURINARY: No dysuria or hematuria.  ENDOCRINE: No polyuria or nocturia. No heat or cold intolerance.  HEMATOLOGY: No anemia. No bruising. No bleeding.  INTEGUMENTARY: No rashes. No lesions.  MUSCULOSKELETAL: Decreased right leg pain NEUROLOGIC: No numbness, tingling, or ataxia. No seizure-type activity.  PSYCHIATRIC: No anxiety. No insomnia. No ADD.    Vitals:   Vitals:   01/24/18 0804 01/24/18 1551 01/24/18 2346 01/25/18 0800  BP: (!) 144/71 126/66 115/67 130/87  Pulse: (!) 103 (!) 101 97 98  Resp: 18 19 19 18   Temp: 97.8 F (36.6 C) 98.9 F (37.2 C) 99.1 F (37.3 C) 98.7 F (37.1 C)  TempSrc: Oral Oral Oral Oral  SpO2: 94% 93% (!) 89% 95%  Weight:       Height:        Wt Readings from Last 3 Encounters:  01/22/18 104.8 kg (231 lb)  01/02/18 107.7 kg (237 lb 6.4 oz)  11/28/17 108.5 kg (239 lb 3.2 oz)     Intake/Output Summary (Last 24 hours) at 01/25/2018 1107 Last data filed at 01/25/2018 1032 Gross per 24 hour  Intake 720 ml  Output 1050 ml  Net -330 ml    Physical Exam:   GENERAL: Pleasant-appearing in no apparent distress.  HEAD, EYES, EARS, NOSE AND THROAT: Atraumatic, normocephalic. Extraocular muscles are intact. Pupils equal and reactive to light. Sclerae anicteric. No conjunctival injection. No oro-pharyngeal erythema.  NECK: Supple.  There is no jugular venous distention. No bruits, no lymphadenopathy, no thyromegaly.  HEART: Regular rate and rhythm,. No murmurs, no rubs, no clicks.  LUNGS: Clear to auscultation bilaterally. No rales or rhonchi. No wheezes.  ABDOMEN: Soft, flat, nontender, nondistended. Has good bowel sounds. No hepatosplenomegaly appreciated.  EXTREMITIES: No evidence of any cyanosis, clubbing, or peripheral edema.  +2 pedal and radial pulses bilaterally.  S/p right leg surgery with cast NEUROLOGIC: The patient is alert, awake, and oriented x3 with no focal motor or sensory deficits appreciated bilaterally.  SKIN: Moist and warm with no rashes appreciated.  Psych: Not anxious, depressed LN: No inguinal LN enlargement    Antibiotics   Anti-infectives (From admission, onward)   Start     Dose/Rate Route Frequency Ordered Stop   01/22/18 2000  clindamycin (CLEOCIN) IVPB 600 mg     600 mg 100 mL/hr over 30 Minutes Intravenous Every 6 hours 01/22/18 1637 01/23/18 0424   01/22/18 1300  clindamycin (CLEOCIN) IVPB 600 mg     600 mg 100 mL/hr over 30 Minutes Intravenous  Once 01/22/18 1251 01/22/18 1359   01/22/18 1256  clindamycin (CLEOCIN) 600 MG/50ML IVPB    Note to Pharmacy:  Dellis Filbert   : cabinet override      01/22/18 1256 01/22/18 2018   01/21/18 2330  clindamycin (CLEOCIN) IVPB 900 mg      900 mg 100 mL/hr over 30 Minutes Intravenous  Once 01/21/18 2315 01/22/18 0601      Medications   Scheduled Meds: . aspirin EC  81 mg Oral QPM  . atorvastatin  40 mg Oral QHS  . benazepril  40 mg Oral Daily  . docusate sodium  100 mg Oral BID  . enoxaparin (LOVENOX) injection  40 mg Subcutaneous Q24H  . hydrochlorothiazide  25 mg Oral Daily  . insulin glargine  35 Units Subcutaneous Daily  . insulin regular  0-22 Units Subcutaneous TID AC & HS  . insulin regular  10 Units Subcutaneous TID AC  . pantoprazole  40 mg Oral Daily  . thyroid  120 mg Oral QAC breakfast  . traMADol  50 mg Oral Q6H   Continuous Infusions: . 0.9 % NaCl with KCl 20 mEq / L Stopped (01/25/18 0655)  . methocarbamol (ROBAXIN)  IV     PRN Meds:.alum & mag hydroxide-simeth, bisacodyl, diphenhydrAMINE, HYDROmorphone (DILAUDID) injection, magnesium citrate, methocarbamol **OR** methocarbamol (ROBAXIN)  IV, ondansetron **OR** ondansetron (ZOFRAN) IV, oxyCODONE, oxyCODONE, polyethylene glycol   Data Review:   Micro Results Recent Results (from the past 240 hour(s))  Urine culture     Status: None   Collection Time: 01/22/18 12:25 AM  Result Value Ref Range Status   Specimen Description   Final    URINE, RANDOM Performed at [facility], [address], [place], [place] [address]    Special Requests   Final    NONE Performed at [facility], [address] [place], [place] [address]    Culture   Final    NO GROWTH Performed at [facility], [address], [place], [place] [address]    Report Status 01/23/2018 FINAL  Final  Surgical PCR screen     Status: None   Collection Time: 01/22/18 12:25 AM  Result Value Ref Range Status   MRSA, PCR NEGATIVE NEGATIVE Final   Staphylococcus aureus NEGATIVE NEGATIVE Final    Comment: (NOTE) The Xpert SA Assay (FDA approved for NASAL specimens in patients 63 years of age and  older), is one component of a comprehensive surveillance  program. It is not intended to diagnose infection nor to guide or monitor treatment. Performed at [facility], [address] [place], [place] [address]     Radiology Reports Dg Tibia/fibula Right  Result Date: 01/22/2018 CLINICAL DATA:  Postop EXAM: RIGHT TIBIA AND FIBULA - 2 VIEW COMPARISON:  01/21/2018 FINDINGS: Interval intramedullary rod and screw fixation of the tibia across distal tibial shaft fracture with less than 1/4 bone with of lateral and posterior displacement of distal fracture fragment, improved since prior. Acute displaced proximal fibular shaft fracture with about 1/2 bone with of lateral and anterior displacement of distal fracture fragment. IMPRESSION: 1. Interval surgical fixation of distal tibial fracture with decreased displacement 2. Acute proximal fibular fracture also with decreased displacement. Electronically Signed   By: Jasmine PangKim  Fujinaga M.D.   On: 01/22/2018 16:44   Dg Tibia/fibula Right  Result Date: 01/22/2018 CLINICAL DATA:  Intraoperative fixation EXAM: RIGHT TIBIA AND FIBULA - 2 VIEW; DG C-ARM 61-120 MIN COMPARISON:  January 21, 2018 FLUOROSCOPY TIME:  3 minutes 30 seconds; dose 5.5 mGy; 6 acquired images FINDINGS: Frontal and lateral views obtained. There is screw and rod fixation through a fracture of the junction of the mid and distal thirds of the tibia. Alignment is essentially anatomic postoperatively. There is a fracture of the proximal fibular diaphysis with slight lateral displacement distally. No dislocation evident. Visualized joint spaces appear unremarkable. IMPRESSION: Postoperative fixation of fracture of the junction of the mid and distal thirds of the tibia with alignment essentially anatomic. Mildly displaced fracture proximal fibula. Electronically Signed   By: Bretta BangWilliam  Woodruff III M.D.   On: 01/22/2018 16:11   Dg Tibia/fibula Right  Result Date: 01/21/2018 CLINICAL DATA:  [place].  Right lower leg pain. EXAM: RIGHT  TIBIA AND FIBULA - 2 VIEW COMPARISON:  None. FINDINGS: There is a displaced slightly comminuted oblique fracture of the proximal fibular diaphysis with 1 shaft's with of anterior and lateral displacement of the distal fragment. There is an oblique displaced fracture of the distal tibial diaphysis with 1/2 shaft's with posterolateral displacement of the distal fragment. Mild medial and posterior angulation of the distal fragment. Degenerative changes of the knee. Significant degenerative changes of the mid and hindfoot. Loss of height of the calcaneus which may be acute or chronic. Small vessel atherosclerotic disease is present. IMPRESSION: Displaced oblique fracture of the distal tibial diaphysis and displaced oblique slightly comminuted fracture of the proximal fibular diaphysis. Loss of height of the calcaneus which may be from acute or chronic fracture. Electronically Signed   By: Elberta Fortisaniel  Boyle M.D.   On: 01/21/2018 20:42   Dg Ankle Complete Right  Result Date: 01/21/2018 CLINICAL DATA:  [place]. EXAM: RIGHT ANKLE - COMPLETE 3+ VIEW COMPARISON:  None. FINDINGS: There is a oblique displaced fracture of the distal tibial diaphysis with mild medial and posterior angulation of the distal fragment and 1/2 chest with of displacement. Ankle mortise is within normal. Loss of height of the calcaneus with subtle horizontal lucency over the posterior calcaneus on the lateral film and as these findings may be due to acute or chronic fracture. Degenerative changes of the mid to hindfoot. Small vessel atherosclerotic disease is present. IMPRESSION: Displaced oblique fracture of the distal tibial diaphysis. Loss of height of the calcaneus with subtle horizontal lucency over the posterior calcaneus as findings may be due to an acute or chronic fracture. Electronically Signed  By: Elberta Fortis M.D.   On: 01/21/2018 20:44   Ct Foot Right Wo Contrast  Result Date: 01/23/2018 CLINICAL DATA:  Fall 2 days  ago. Third and fourth metatarsal fractures. Tibial fracture ORIF yesterday. EXAM: CT OF THE RIGHT FOOT WITHOUT CONTRAST TECHNIQUE: Multidetector CT imaging of the right foot was performed according to the standard protocol. Multiplanar CT image reconstructions were also generated. COMPARISON:  Right foot x-rays dated January 21, 2018. FINDINGS: Bones/Joint/Cartilage There is a nondisplaced intra-articular fracture of the posterior malleolus. No articular surface step-off. There are acute fractures of the third and fourth metatarsal necks with slight lateral displacement and plantar angulation. There are nondisplaced fractures at the bases of the second, third, and fourth metatarsals. There is a slightly displaced fracture at the distal and lateral aspect of the middle cuneiform. Lisfranc alignment is maintained. There is a mildly displaced, intra-articular fracture at the medial base of the first proximal phalanx. The talar dome is intact. The ankle mortise is symmetric. Chronic, depressed fracture of the calcaneus with evidence of pseudoarticulation between the calcaneus and fibula. Old healed fracture of the distal fifth metatarsal shaft. Mild osteoarthritis of the subtalar, talonavicular, and calcaneocuboid joints. Trace tibiotalar joint effusion. Ligaments Suboptimally assessed by CT. Muscles and Tendons Prominent fatty atrophy of the intrinsic foot musculature the visualized flexor, extensor, and peroneal tendons are grossly intact. Soft tissues Moderate soft tissue swelling about the hindfoot. Mild soft tissue swelling over the dorsum of the forefoot. No drainable fluid collection. IMPRESSION: 1. Acute, nondisplaced intra-articular fracture of the posterior malleolus without articular surface step-off. 2. Acute, mildly displaced fractures of the third and fourth metatarsal necks. 3. Acute, nondisplaced fractures at the bases of the second, third, and fourth metatarsals. Lisfranc alignment is maintained. 4.  Acute, minimally displaced fracture of the middle cuneiform. 5. Acute, mildly displaced fracture at the medial base of the first proximal phalanx. 6. Chronic, healed, depressed fracture of the calcaneus with evidence of resultant lateral hindfoot impingement. Electronically Signed   By: Obie Dredge M.D.   On: 01/23/2018 10:28   Chest Portable 1 View  Result Date: 01/21/2018 CLINICAL DATA:  Preoperative radiograph EXAM: PORTABLE CHEST 1 VIEW COMPARISON:  Chest radiograph 05/23/2017 FINDINGS: Unchanged cardiomegaly. No focal airspace consolidation or pulmonary edema. No pneumothorax or pleural effusion. IMPRESSION: No active disease. Electronically Signed   By: Deatra Robinson M.D.   On: 01/21/2018 23:45   Dg Foot 2 Views Right  Result Date: 01/21/2018 CLINICAL DATA:  Dorsal foot pain after fall in kitchen tonight. EXAM: RIGHT FOOT - 2 VIEW COMPARISON:  None. FINDINGS: Examination demonstrates diffuse osteopenia. There are degenerative changes over the first MTP joint. There are degenerative changes of the interphalangeal joints as well as over the mid to hindfoot region. There is a displaced transverse fracture at the base of the head of the third and fourth metatarsals. There is loss of height of the calcaneus with subtle horizontal lucency over the posterior calcaneus as this may be due to acute or chronic fracture. Small vessel atherosclerotic disease is present. IMPRESSION: Minimally displaced transverse fractures at the base of the head of the third and fourth metatarsals. Loss of height of the calcaneus with subtle horizontal lucency over the superior portion of the calcaneus which may be due to acute or chronic fracture. Degenerative changes as described. Electronically Signed   By: Elberta Fortis M.D.   On: 01/21/2018 20:47   Dg C-arm 1-60 Min  Result Date: 01/22/2018 CLINICAL DATA:  Intraoperative fixation  EXAM: RIGHT TIBIA AND FIBULA - 2 VIEW; DG C-ARM 61-120 MIN COMPARISON:  January 21, 2018  FLUOROSCOPY TIME:  3 minutes 30 seconds; dose 5.5 mGy; 6 acquired images FINDINGS: Frontal and lateral views obtained. There is screw and rod fixation through a fracture of the junction of the mid and distal thirds of the tibia. Alignment is essentially anatomic postoperatively. There is a fracture of the proximal fibular diaphysis with slight lateral displacement distally. No dislocation evident. Visualized joint spaces appear unremarkable. IMPRESSION: Postoperative fixation of fracture of the junction of the mid and distal thirds of the tibia with alignment essentially anatomic. Mildly displaced fracture proximal fibula. Electronically Signed   By: Bretta Bang III M.D.   On: 01/22/2018 16:11     CBC Recent Labs  Lab 01/21/18 1921 01/23/18 0456 01/24/18 0428 01/25/18 0459  WBC 7.2 7.5 7.7 7.7  HGB 13.2 10.8* 10.4* 10.6*  HCT 39.7* 31.9* 30.8* 30.8*  PLT 155 123* 121* 124*  MCV 94.6 95.1 94.6 94.5  MCH 31.5 32.1 32.0 32.4  MCHC 33.3 33.8 33.8 34.3  RDW 15.4* 15.0* 15.0* 15.1*    Chemistries  Recent Labs  Lab 01/21/18 1921 01/23/18 0456 01/24/18 0428  NA 140 140 142  K 3.8 4.1 3.9  CL 105 107 106  CO2 22 26 29   GLUCOSE 183* 226* 137*  BUN 17 18 15   CREATININE 1.02 1.05 0.93  CALCIUM 8.9 8.2* 8.7*   ------------------------------------------------------------------------------------------------------------------ estimated creatinine clearance is 80.6 mL/min (by C-G formula based on SCr of 0.93 mg/dL). ------------------------------------------------------------------------------------------------------------------ No results for input(s): HGBA1C in the last 72 hours. ------------------------------------------------------------------------------------------------------------------ No results for input(s): CHOL, HDL, LDLCALC, TRIG, CHOLHDL, LDLDIRECT in the last 72  hours. ------------------------------------------------------------------------------------------------------------------ No results for input(s): TSH, T4TOTAL, T3FREE, THYROIDAB in the last 72 hours.  Invalid input(s): FREET3 ------------------------------------------------------------------------------------------------------------------ No results for input(s): VITAMINB12, FOLATE, FERRITIN, TIBC, IRON, RETICCTPCT in the last 72 hours.  Coagulation profile Recent Labs  Lab 01/22/18 0014  INR 0.98    No results for input(s): DDIMER in the last 72 hours.  Cardiac Enzymes No results for input(s): CKMB, TROPONINI, MYOGLOBIN in the last 168 hours.  Invalid input(s): CK ------------------------------------------------------------------------------------------------------------------ Invalid input(s): POCBNP    Assessment & Plan   1. Closed fracture of right tibia and fibula -Management as per  primary orthopedic team  S/p ORIF and repair Weight bearing as per Ortho recommendations   2. Type 2 diabetes mellitus (HCC) - elevated sugars Blood sugars : 302,346,302,98,195 Humulin R sliding sclae Increase lantus 35 units Sq daily  Increase Humulin R to 10 units before meals three times daily  Consulted diabetic coordinator, appreciate their recommendations  3. Essential hypertension -continue home meds benazepril and hydrochlorothiazide  4.HLD (hyperlipidemia) -Lipitor to continue  5. Hypothyroidism -[place] Thyroid 120 mg once daily  6. GERD (gastroesophageal reflux disease) - PPI  7. SNF placement as recommended by PT once ok with orthopedics , pending insurance authorization  8. DVT prophylaxis with 40 mg lovenox [place] daily    Code Status History    Date Active Date Inactive Code Status Order ID Comments User Context   01/21/2018 2315 01/22/2018 1636 Full Code [id number]  Juanell Fairly, MD ED   05/23/2017 2028 05/24/2017 1809 Full Code [id number]  Alford Highland, MD ED   05/05/2016 1553 05/05/2016 1855 Full Code [id number]  Kennedy Bucker, MD Inpatient   03/18/2014 2250 03/21/2014 1940 Full Code [id number]  Eduard Clos, MD Inpatient   08/14/2013 1539 08/20/2013 1741 Full Code [id number]  Clyde Lundborg,  Brien Few, MD Inpatient    Advance Directive Documentation     Most Recent Value  Type of Advance Directive  Healthcare Power of Attorney, Living will  Pre-existing out of facility DNR order (yellow form or pink MOST form)  -  "MOST" Form in Place?  -     Time Spent in minutes   33 minutes  Greater than 50% of time spent in care coordination and counseling patient regarding the condition and plan of care.   Ihor Austin M.D on 01/25/2018 at 11:07 AM  Between 7am to 6pm - Pager - [phone]  After 6pm go to [email or web address] - Social research officer, government  [employer]   Office  [phone]

## 2018-01-26 DIAGNOSIS — N183 Chronic kidney disease, stage 3 (moderate): Secondary | ICD-10-CM | POA: Diagnosis not present

## 2018-01-26 DIAGNOSIS — E039 Hypothyroidism, unspecified: Secondary | ICD-10-CM | POA: Diagnosis not present

## 2018-01-26 DIAGNOSIS — R062 Wheezing: Secondary | ICD-10-CM | POA: Diagnosis not present

## 2018-01-26 DIAGNOSIS — R6889 Other general symptoms and signs: Secondary | ICD-10-CM | POA: Diagnosis not present

## 2018-01-26 DIAGNOSIS — E119 Type 2 diabetes mellitus without complications: Secondary | ICD-10-CM | POA: Diagnosis not present

## 2018-01-26 DIAGNOSIS — R05 Cough: Secondary | ICD-10-CM | POA: Diagnosis not present

## 2018-01-26 DIAGNOSIS — S82201D Unspecified fracture of shaft of right tibia, subsequent encounter for closed fracture with routine healing: Secondary | ICD-10-CM | POA: Diagnosis not present

## 2018-01-26 DIAGNOSIS — Z7401 Bed confinement status: Secondary | ICD-10-CM | POA: Diagnosis not present

## 2018-01-26 DIAGNOSIS — S82401D Unspecified fracture of shaft of right fibula, subsequent encounter for closed fracture with routine healing: Secondary | ICD-10-CM | POA: Diagnosis not present

## 2018-01-26 DIAGNOSIS — S92331D Displaced fracture of third metatarsal bone, right foot, subsequent encounter for fracture with routine healing: Secondary | ICD-10-CM | POA: Diagnosis not present

## 2018-01-26 DIAGNOSIS — S82201A Unspecified fracture of shaft of right tibia, initial encounter for closed fracture: Secondary | ICD-10-CM | POA: Diagnosis not present

## 2018-01-26 DIAGNOSIS — Z794 Long term (current) use of insulin: Secondary | ICD-10-CM | POA: Diagnosis not present

## 2018-01-26 DIAGNOSIS — S82231D Displaced oblique fracture of shaft of right tibia, subsequent encounter for closed fracture with routine healing: Secondary | ICD-10-CM | POA: Diagnosis not present

## 2018-01-26 DIAGNOSIS — S82301D Unspecified fracture of lower end of right tibia, subsequent encounter for closed fracture with routine healing: Secondary | ICD-10-CM | POA: Diagnosis not present

## 2018-01-26 DIAGNOSIS — W19XXXD Unspecified fall, subsequent encounter: Secondary | ICD-10-CM | POA: Diagnosis not present

## 2018-01-26 DIAGNOSIS — I10 Essential (primary) hypertension: Secondary | ICD-10-CM | POA: Diagnosis not present

## 2018-01-26 DIAGNOSIS — S92341D Displaced fracture of fourth metatarsal bone, right foot, subsequent encounter for fracture with routine healing: Secondary | ICD-10-CM | POA: Diagnosis not present

## 2018-01-26 DIAGNOSIS — M8000XD Age-related osteoporosis with current pathological fracture, unspecified site, subsequent encounter for fracture with routine healing: Secondary | ICD-10-CM | POA: Diagnosis not present

## 2018-01-26 DIAGNOSIS — E785 Hyperlipidemia, unspecified: Secondary | ICD-10-CM | POA: Diagnosis not present

## 2018-01-26 LAB — GLUCOSE, CAPILLARY
GLUCOSE-CAPILLARY: 93 mg/dL (ref 65–99)
Glucose-Capillary: 245 mg/dL — ABNORMAL HIGH (ref 65–99)
Glucose-Capillary: 349 mg/dL — ABNORMAL HIGH (ref 65–99)
Glucose-Capillary: 264 mg/dL — ABNORMAL HIGH (ref 65–99)

## 2018-01-26 MED ORDER — INSULIN REGULAR HUMAN 100 UNIT/ML IJ SOLN: 16.0000 [IU] | Freq: Two times a day (BID) | INTRAMUSCULAR | 11 refills | Status: DC

## 2018-01-26 MED ORDER — INSULIN GLARGINE 100 UNIT/ML ~~LOC~~ SOLN
56.0000 [IU] | Freq: Every day | SUBCUTANEOUS | Status: DC
  Filled 2018-01-26: qty 0.56

## 2018-01-26 MED ORDER — INSULIN GLARGINE 100 UNIT/ML ~~LOC~~ SOLN: 56.0000 [IU] | Freq: Every day | SUBCUTANEOUS | Status: DC

## 2018-01-26 MED ORDER — INSULIN GLARGINE 100 UNIT/ML ~~LOC~~ SOLN: 56.0000 [IU] | Freq: Every day | SUBCUTANEOUS | 11 refills | Status: DC

## 2018-01-26 MED ORDER — INSULIN REGULAR HUMAN 100 UNIT/ML IJ SOLN
16.0000 [IU] | Freq: Two times a day (BID) | INTRAMUSCULAR | Status: DC
  Administered 2018-01-26: 16 [IU] via SUBCUTANEOUS

## 2018-01-26 NOTE — Discharge Summary (Signed)
Physician Discharge Summary  Patient ID: [name] MRN: [id number] DOB/AGE: Oct 20, 1941 77 y.o.  Admit date: 01/21/2018 Discharge date: 01/26/2018  Admission Diagnoses:  right tibia fracture Closed fracture of right tibia and fibula  Discharge Diagnoses:  right tibia fracture Principal Problem:   Closed fracture of right tibia and fibula Active Problems:   Type 2 diabetes mellitus (HCC)   HLD (hyperlipidemia)   Essential hypertension   Hypothyroidism   GERD (gastroesophageal reflux disease)   Right tibial fracture s/p intramedullary fixation of right tibia  Past Medical History:  Diagnosis Date  . Agent orange exposure    "[name]"  . Arthritis    "left knee" (03/18/2014)  . Closed pelvic fracture (HCC) 03/18/2014   "multiple S/P fall w/loss of consciousness; CBG 29"  . Encephalopathy acute 08/16/2013  . GERD (gastroesophageal reflux disease)   . High cholesterol   . Hypertension   . Hypothyroidism   . Type II diabetes mellitus (HCC)     Surgeries: Procedure(s): INTRAMEDULLARY (IM) NAIL INTERTROCHANTRIC on 01/22/2018   Consultants (if any): Treatment Team:  Tony Lab, MD  Discharged Condition: Improved  Hospital Course: [name] is an 77 y.o. male who was admitted 01/21/2018 with a diagnosis of  right tibia fracture Closed fracture of right tibia and fibula and went to the operating room on 01/22/2018 and underwent the above named procedures.    He was given perioperative antibiotics:  Anti-infectives (From admission, onward)   Start     Dose/Rate Route Frequency Ordered Stop   01/22/18 2000  clindamycin (CLEOCIN) IVPB 600 mg     600 mg 100 mL/hr over 30 Minutes Intravenous Every 6 hours 01/22/18 1637 01/23/18 0424   01/22/18 1300  clindamycin (CLEOCIN) IVPB 600 mg     600 mg 100 mL/hr over 30 Minutes Intravenous  Once 01/22/18 1251 01/22/18 1359   01/22/18 1256  clindamycin (CLEOCIN) 600 MG/50ML IVPB    Note to Pharmacy:  Dellis Filbert   :  cabinet override      01/22/18 1256 01/22/18 2018   01/21/18 2330  clindamycin (CLEOCIN) IVPB 900 mg     900 mg 100 mL/hr over 30 Minutes Intravenous  Once 01/21/18 2315 01/22/18 0601    .  He was given sequential compression devices, early ambulation, and lovenox for DVT prophylaxis.  He benefited maximally from the hospital stay and there were no complications.    Recent vital signs:  Vitals:   01/25/18 2333 01/26/18 0757  BP: 133/69 (!) 116/91  Pulse: 90 (!) 103  Resp: 17 18  Temp: 98.6 F (37 C) 97.9 F (36.6 C)  SpO2: 94% 93%    Recent laboratory studies:  Joseph Results  Component Value Date   HGB 10.6 (L) 01/25/2018   HGB 10.4 (L) 01/24/2018   HGB 10.8 (L) 01/23/2018   Joseph Results  Component Value Date   WBC 7.7 01/25/2018   PLT 124 (L) 01/25/2018   Joseph Results  Component Value Date   INR 0.98 01/22/2018   Joseph Results  Component Value Date   NA 142 01/24/2018   K 3.9 01/24/2018   CL 106 01/24/2018   CO2 29 01/24/2018   BUN 15 01/24/2018   CREATININE 0.93 01/24/2018   GLUCOSE 137 (H) 01/24/2018    Discharge Medications:   Allergies as of 01/26/2018      Reactions   Insulins Other (See Comments)   Pt states that no insulin works for him, but Lantus and Humulin R.  Penicillins Rash, Other (See Comments)   Has patient had a PCN reaction causing immediate rash, facial/tongue/throat swelling, SOB or lightheadedness with hypotension: No Has patient had a PCN reaction causing severe rash involving mucus membranes or skin necrosis: No Has patient had a PCN reaction that required hospitalization No Has patient had a PCN reaction occurring within the last 10 years: No If all of the above answers are "NO", then may proceed with Cephalosporin use.      Medication List    TAKE these medications   aspirin EC 81 MG tablet Take 81 mg by mouth daily.   atorvastatin 40 MG tablet Commonly known as:  LIPITOR Take 40 mg by mouth daily at 6 PM.   benazepril 40  MG tablet Commonly known as:  LOTENSIN Take 40 mg by mouth daily.   Cholecalciferol 1000 units capsule Take 1,000 Units by mouth daily.   docusate sodium 100 MG capsule Commonly known as:  COLACE Take 1 capsule (100 mg total) by mouth 2 (two) times daily.   enoxaparin 40 MG/0.4ML injection Commonly known as:  LOVENOX Inject 0.4 mLs (40 mg total) into the skin daily.   HUMULIN R 100 units/mL injection Generic drug:  insulin regular Inject 0.15-0.17 mLs (15-17 Units total) into the skin 2 (two) times daily before a meal. And per sliding scale Pt is not to get novolin at all What changed:  additional instructions   insulin regular 100 units/mL injection Commonly known as:  NOVOLIN R,HUMULIN R Inject 0.16 mLs (16 Units total) into the skin 2 (two) times daily before a meal. What changed:  You were already taking a medication with the same name, and this prescription was added. Make sure you understand how and when to take each.   hydrochlorothiazide 25 MG tablet Commonly known as:  HYDRODIURIL Take 25 mg by mouth daily.   Insulin Glargine 100 UNIT/ML Solostar Pen Commonly known as:  LANTUS SOLOSTAR Inject 65 Units into the skin daily at 10 pm. What changed:    how much to take  when to take this   insulin glargine 100 UNIT/ML injection Commonly known as:  LANTUS Inject 0.56 mLs (56 Units total) into the skin daily with breakfast. Start taking on:  01/27/2018 What changed:  You were already taking a medication with the same name, and this prescription was added. Make sure you understand how and when to take each.   [place] Ginseng 1000 MG Tabs Take 1,000 mg by mouth daily.   Omega 3 1200 MG Caps Take 1,200 mg by mouth daily.   pantoprazole 40 MG tablet Commonly known as:  PROTONIX TAKE 1 TABLET BY MOUTH DAILY   pyridOXINE 100 MG tablet Commonly known as:  VITAMIN B-6 Take 100 mg by mouth daily.   thyroid 120 MG tablet Commonly known as:  ARMOUR Take 120 mg by  mouth daily before breakfast.   traMADol 50 MG tablet Commonly known as:  ULTRAM Take 1 tablet (50 mg total) by mouth every 6 (six) hours as needed.       Diagnostic Studies: Dg Tibia/fibula Right  Result Date: 01/22/2018 CLINICAL DATA:  Postop EXAM: RIGHT TIBIA AND FIBULA - 2 VIEW COMPARISON:  01/21/2018 FINDINGS: Interval intramedullary rod and screw fixation of the tibia across distal tibial shaft fracture with less than 1/4 bone with of lateral and posterior displacement of distal fracture fragment, improved since prior. Acute displaced proximal fibular shaft fracture with about 1/2 bone with of lateral and anterior displacement of distal  fracture fragment. IMPRESSION: 1. Interval surgical fixation of distal tibial fracture with decreased displacement 2. Acute proximal fibular fracture also with decreased displacement. Electronically Signed   By: Jasmine PangKim  Fujinaga M.D.   On: 01/22/2018 16:44   Dg Tibia/fibula Right  Result Date: 01/22/2018 CLINICAL DATA:  Intraoperative fixation EXAM: RIGHT TIBIA AND FIBULA - 2 VIEW; DG C-ARM 61-120 MIN COMPARISON:  January 21, 2018 FLUOROSCOPY TIME:  3 minutes 30 seconds; dose 5.5 mGy; 6 acquired images FINDINGS: Frontal and lateral views obtained. There is screw and rod fixation through a fracture of the junction of the mid and distal thirds of the tibia. Alignment is essentially anatomic postoperatively. There is a fracture of the proximal fibular diaphysis with slight lateral displacement distally. No dislocation evident. Visualized joint spaces appear unremarkable. IMPRESSION: Postoperative fixation of fracture of the junction of the mid and distal thirds of the tibia with alignment essentially anatomic. Mildly displaced fracture proximal fibula. Electronically Signed   By: Bretta BangWilliam  Woodruff III M.D.   On: 01/22/2018 16:11   Dg Tibia/fibula Right  Result Date: 01/21/2018 CLINICAL DATA:  [place] tonight.  Right lower leg pain. EXAM: RIGHT TIBIA AND  FIBULA - 2 VIEW COMPARISON:  None. FINDINGS: There is a displaced slightly comminuted oblique fracture of the proximal fibular diaphysis with 1 shaft's with of anterior and lateral displacement of the distal fragment. There is an oblique displaced fracture of the distal tibial diaphysis with 1/2 shaft's with posterolateral displacement of the distal fragment. Mild medial and posterior angulation of the distal fragment. Degenerative changes of the knee. Significant degenerative changes of the mid and hindfoot. Loss of height of the calcaneus which may be acute or chronic. Small vessel atherosclerotic disease is present. IMPRESSION: Displaced oblique fracture of the distal tibial diaphysis and displaced oblique slightly comminuted fracture of the proximal fibular diaphysis. Loss of height of the calcaneus which may be from acute or chronic fracture. Electronically Signed   By: Elberta Fortisaniel  Boyle M.D.   On: 01/21/2018 20:42   Dg Ankle Complete Right  Result Date: 01/21/2018 CLINICAL DATA:  [place]. EXAM: RIGHT ANKLE - COMPLETE 3+ VIEW COMPARISON:  None. FINDINGS: There is a oblique displaced fracture of the distal tibial diaphysis with mild medial and posterior angulation of the distal fragment and 1/2 chest with of displacement. Ankle mortise is within normal. Loss of height of the calcaneus with subtle horizontal lucency over the posterior calcaneus on the lateral film and as these findings may be due to acute or chronic fracture. Degenerative changes of the mid to hindfoot. Small vessel atherosclerotic disease is present. IMPRESSION: Displaced oblique fracture of the distal tibial diaphysis. Loss of height of the calcaneus with subtle horizontal lucency over the posterior calcaneus as findings may be due to an acute or chronic fracture. Electronically Signed   By: Elberta Fortisaniel  Boyle M.D.   On: 01/21/2018 20:44   Ct Foot Right Wo Contrast  Result Date: 01/23/2018 CLINICAL DATA:  Fall 2 days ago. Third  and fourth metatarsal fractures. Tibial fracture ORIF yesterday. EXAM: CT OF THE RIGHT FOOT WITHOUT CONTRAST TECHNIQUE: Multidetector CT imaging of the right foot was performed according to the standard protocol. Multiplanar CT image reconstructions were also generated. COMPARISON:  Right foot x-rays dated January 21, 2018. FINDINGS: Bones/Joint/Cartilage There is a nondisplaced intra-articular fracture of the posterior malleolus. No articular surface step-off. There are acute fractures of the third and fourth metatarsal necks with slight lateral displacement and plantar angulation. There are nondisplaced  fractures at the bases of the second, third, and fourth metatarsals. There is a slightly displaced fracture at the distal and lateral aspect of the middle cuneiform. Lisfranc alignment is maintained. There is a mildly displaced, intra-articular fracture at the medial base of the first proximal phalanx. The talar dome is intact. The ankle mortise is symmetric. Chronic, depressed fracture of the calcaneus with evidence of pseudoarticulation between the calcaneus and fibula. Old healed fracture of the distal fifth metatarsal shaft. Mild osteoarthritis of the subtalar, talonavicular, and calcaneocuboid joints. Trace tibiotalar joint effusion. Ligaments Suboptimally assessed by CT. Muscles and Tendons Prominent fatty atrophy of the intrinsic foot musculature the visualized flexor, extensor, and peroneal tendons are grossly intact. Soft tissues Moderate soft tissue swelling about the hindfoot. Mild soft tissue swelling over the dorsum of the forefoot. No drainable fluid collection. IMPRESSION: 1. Acute, nondisplaced intra-articular fracture of the posterior malleolus without articular surface step-off. 2. Acute, mildly displaced fractures of the third and fourth metatarsal necks. 3. Acute, nondisplaced fractures at the bases of the second, third, and fourth metatarsals. Lisfranc alignment is maintained. 4. Acute, minimally  displaced fracture of the middle cuneiform. 5. Acute, mildly displaced fracture at the medial base of the first proximal phalanx. 6. Chronic, healed, depressed fracture of the calcaneus with evidence of resultant lateral hindfoot impingement. Electronically Signed   By: Obie Dredge M.D.   On: 01/23/2018 10:28   Chest Portable 1 View  Result Date: 01/21/2018 CLINICAL DATA:  Preoperative radiograph EXAM: PORTABLE CHEST 1 VIEW COMPARISON:  Chest radiograph 05/23/2017 FINDINGS: Unchanged cardiomegaly. No focal airspace consolidation or pulmonary edema. No pneumothorax or pleural effusion. IMPRESSION: No active disease. Electronically Signed   By: Deatra Robinson M.D.   On: 01/21/2018 23:45   Dg Foot 2 Views Right  Result Date: 01/21/2018 CLINICAL DATA:  Dorsal foot pain after fall in kitchen tonight. EXAM: RIGHT FOOT - 2 VIEW COMPARISON:  None. FINDINGS: Examination demonstrates diffuse osteopenia. There are degenerative changes over the first MTP joint. There are degenerative changes of the interphalangeal joints as well as over the mid to hindfoot region. There is a displaced transverse fracture at the base of the head of the third and fourth metatarsals. There is loss of height of the calcaneus with subtle horizontal lucency over the posterior calcaneus as this may be due to acute or chronic fracture. Small vessel atherosclerotic disease is present. IMPRESSION: Minimally displaced transverse fractures at the base of the head of the third and fourth metatarsals. Loss of height of the calcaneus with subtle horizontal lucency over the superior portion of the calcaneus which may be due to acute or chronic fracture. Degenerative changes as described. Electronically Signed   By: Elberta Fortis M.D.   On: 01/21/2018 20:47   Dg C-arm 1-60 Min  Result Date: 01/22/2018 CLINICAL DATA:  Intraoperative fixation EXAM: RIGHT TIBIA AND FIBULA - 2 VIEW; DG C-ARM 61-120 MIN COMPARISON:  January 21, 2018 FLUOROSCOPY TIME:  3  minutes 30 seconds; dose 5.5 mGy; 6 acquired images FINDINGS: Frontal and lateral views obtained. There is screw and rod fixation through a fracture of the junction of the mid and distal thirds of the tibia. Alignment is essentially anatomic postoperatively. There is a fracture of the proximal fibular diaphysis with slight lateral displacement distally. No dislocation evident. Visualized joint spaces appear unremarkable. IMPRESSION: Postoperative fixation of fracture of the junction of the mid and distal thirds of the tibia with alignment essentially anatomic. Mildly displaced fracture proximal fibula. Electronically Signed  By: Bretta Bang III M.D.   On: 01/22/2018 16:11    Disposition: Discharge disposition: 03-Skilled Nursing Facility       Discharge Instructions    Call MD / Call 911   Complete by:  As directed    If you experience chest pain or shortness of breath, CALL 911 and be transported to the hospital emergency room.  If you develope a fever above 101 F, pus (white drainage) or increased drainage or redness at the wound, or calf pain, call your surgeon's office.   Constipation Prevention   Complete by:  As directed    Drink plenty of fluids.  Prune juice may be helpful.  You may use a stool softener, such as Colace (over the counter) 100 mg twice a day.  Use MiraLax (over the counter) for constipation as needed.   Diet - low sodium heart healthy   Complete by:  As directed    Discharge instructions   Complete by:  As directed    Patient is NON WEIGHTBEARING on his RIGHT lower extremity due to his tibia fracture and foot fractures.  He may perform nonweightbearing knee ROM and lower extremity exercises.  Continue lovenox 40 mg PO daily for DVT prophylaxis.  Follow up in the office with Dr. Juanell Fairly in 7-10 days.   Driving restrictions   Complete by:  As directed    No driving for 6-8 weeks   Increase activity slowly as tolerated   Complete by:  As directed     Lifting restrictions   Complete by:  As directed    No lifting for 12 weeks      Contact information for after-discharge care    Destination    [facility] .   Service:  Skilled Nursing Contact information: [address] [place] [address] [phone]               Signed: Juanell Fairly ,MD 01/26/2018, 1:09 PM

## 2018-01-26 NOTE — Progress Notes (Addendum)
Late Entry:  Met with pt around 10:50am today.  Patient was very angry about his blood sugars and stated to me that he was unhappy with the way his CBGs were being managed.  Actively listened to pt's history with his diabetes.  Thanked patient for being a veteran and for defending our country and keeping [place] safe.  Reviewed home doses of insulin with patient and timing of his insulin at home.  Asked pt how we could better manage his CBGs here in the hospital.  Pt stated to me that he would like for his Lantus to be increased to 56 units and to be given at 8am.  Also asked me to change his Regular insulin to 16 units BID with Breakfast and Supper.  Stated to patient that I would call the MD and request the changes.  Patient seemed much happier at the end of our conversation and told me he enjoyed meeting me.  Called Dr. Estanislado Pandy and got permission to change insulin orders to what patient was requesting.  Reviewed insulin order changes with RN Anderson Malta.      --Will follow patient during hospitalization--  Wyn Quaker RN, MSN, CDE Diabetes Coordinator Inpatient Glycemic Control Team Team Pager: [phone] (8a-5p)

## 2018-01-26 NOTE — Progress Notes (Signed)
Patient is medically stable for D/C to [facility] today. [facility] coordinator at [facility] [employer] [facility] authorization has been received and patient can come today to room 506. RN will call report and arrange EMS for transport. Clinical Child psychotherapistocial Worker (CSW) sent D/C orders to [facility] via [facility]. Patient is aware of above. CSW left patient's wife Bonita QuinLinda a voicemail making her aware of above. Please reconsult if future social work needs arise. CSW signing off.   Baker Hughes IncorporatedBailey Sayf Kerner, LCSW [phone]

## 2018-01-26 NOTE — Progress Notes (Signed)
  Subjective:  POD #4 s/p IM fixation of right tibia fracture.  Patient reports left leg pain as mild.    Objective:   VITALS:   Vitals:   01/25/18 1651 01/25/18 1948 01/25/18 2333 01/26/18 0757  BP: 133/72 126/63 133/69 (!) 116/91  Pulse: 86 94 90 (!) 103  Resp:   17 18  Temp: 99.2 F (37.3 C)  98.6 F (37 C) 97.9 F (36.6 C)  TempSrc: Oral  Oral Oral  SpO2: 97%  94% 93%  Weight:      Height:        PHYSICAL EXAM: Right leg:  Patient can flex and extend toes.  Toes perfused. Neurovascular intact Sensation intact distally Intact pulses distally Compartment soft  LABS  Results for orders placed or performed during the hospital encounter of 01/21/18 (from the past 24 hour(s))  Glucose, capillary     Status: Abnormal   Collection Time: 01/25/18  4:50 PM  Result Value Ref Range   Glucose-Capillary 350 (H) 65 - 99 mg/dL   Comment 1 Notify RN   Glucose, capillary     Status: Abnormal   Collection Time: 01/25/18  9:39 PM  Result Value Ref Range   Glucose-Capillary 146 (H) 65 - 99 mg/dL   Comment 1 Notify RN   Glucose, capillary     Status: None   Collection Time: 01/26/18  3:21 AM  Result Value Ref Range   Glucose-Capillary 93 65 - 99 mg/dL   Comment 1 Notify RN   Glucose, capillary     Status: Abnormal   Collection Time: 01/26/18  7:55 AM  Result Value Ref Range   Glucose-Capillary 245 (H) 65 - 99 mg/dL   Comment 1 Notify RN   Glucose, capillary     Status: Abnormal   Collection Time: 01/26/18 12:23 PM  Result Value Ref Range   Glucose-Capillary 349 (H) 65 - 99 mg/dL   Comment 1 Notify RN     No results found.  Assessment/Plan: 4 Days Post-Op   Principal Problem:   Closed fracture of right tibia and fibula Active Problems:   Type 2 diabetes mellitus (HCC)   HLD (hyperlipidemia)   Essential hypertension   Hypothyroidism   GERD (gastroesophageal reflux disease)   Right tibial fracture  Discharge to SNF today.  NWB on right lower extremity x 6 weeks.    Follow up in 1-2 weeks.    Juanell FairlyKRASINSKI, Darletta Noblett , MD 01/26/2018, 1:10 PM

## 2018-01-26 NOTE — Progress Notes (Signed)
Called report to [place] at [facility]. Answered all questions. EMS called for transport

## 2018-01-26 NOTE — Care Management Important Message (Signed)
Important Message  Patient Details  Name: Evelina BucyDonald R Kinkaid MRN: [id number] Date of Birth: 23-Apr-1941   Medicare Important Message Given:  Yes    Olegario MessierKathy A Jalyah Weinheimer 01/26/2018, 11:10 AM

## 2018-01-26 NOTE — Progress Notes (Signed)
As per patient request after discussion with diabetic coordinator, Patient put back on home regimen of Insulin

## 2018-01-26 NOTE — Progress Notes (Signed)
Physical Therapy Treatment Patient Details Name: [name] MRN: [id number] DOB: [birth year] Today's Date: 01/26/2018    History of Present Illness Pt is a 77 y.o.malewho presents with a diagnosis of right tibia and fibula fractures with 3rd and 4th metatarsal fractures to the right foot.Patient explains that he slipped in the kitchen at home sustaining the above-noted injuries. He was unable to weight-bear following his fall. He is brought to the [facility] emergency Department where x-rays were taken and the above-noted fractures were confirmed.  Pt is now s/p IM nail to the R tibia.  Assessment also includes: DM II, HLD, essential HTN, hypothyroidism, and GERD.     PT Comments    Pt is making good progress towards goals. Pt agitated this date reporting he isn't receiving good care. Therapist walked into room and performed deescalating techniques to ease pt's frustration. Pt very pleasant at end of session, however still requesting pain meds. Therapist called RN to advise. Able to transfer to recliner with +2 assist and maintain WBing. Good endurance with there-ex. Pt is hopeful for transition to rehab this date. Will continue to progress.  Follow Up Recommendations  SNF     Equipment Recommendations       Recommendations for Other Services       Precautions / Restrictions Precautions Precautions: Fall Restrictions Weight Bearing Restrictions: Yes RLE Weight Bearing: Non weight bearing    Mobility  Bed Mobility               General bed mobility comments: not performed as pt received seated at EOB trying to get up  Transfers Overall transfer level: Needs assistance Equipment used: Rolling walker (2 wheeled) Transfers: Sit to/from Stand Sit to Stand: Min assist;+2 physical assistance         General transfer comment: bed elevated for ease of transition. Pt able to stand with upright posture and maintain correct WBing. RW used  correctly  Ambulation/Gait Ambulation/Gait assistance: Min assist;+2 physical assistance Ambulation Distance (Feet): 3 Feet Assistive device: Rolling walker (2 wheeled) Gait Pattern/deviations: Step-to pattern     General Gait Details: able to maintain correct WBing, pivots/hops towards recliner. Further distance limited by fatigue and pain   Stairs            Wheelchair Mobility    Modified Rankin (Stroke Patients Only)       Balance                                            Cognition Arousal/Alertness: Awake/alert Behavior During Therapy: WFL for tasks assessed/performed Overall Cognitive Status: Within Functional Limits for tasks assessed                                        Exercises Other Exercises Other Exercises: Performed seated ther-ex including B LE LAQ, ankle pumps (L side), hip abd/add, and SAQ. All ther-ex performed x 15 reps with cga.    General Comments        Pertinent Vitals/Pain Pain Assessment: Faces Faces Pain Scale: Hurts even more Pain Location: R LE Pain Descriptors / Indicators: Discomfort;Grimacing;Guarding Pain Intervention(s): Limited activity within patient's tolerance;Repositioned;Patient requesting pain meds-RN notified    Home Living  Prior Function            PT Goals (current goals can now be found in the care plan section) Acute Rehab PT Goals Patient Stated Goal: To be able to work on his classic cars and work in the yard again PT Goal Formulation: With patient Time For Goal Achievement: 02/05/18 Potential to Achieve Goals: Good Progress towards PT goals: Progressing toward goals    Frequency    BID      PT Plan Current plan remains appropriate    Co-evaluation              AM-PAC PT "6 Clicks" Daily Activity  Outcome Measure  Difficulty turning over in bed (including adjusting bedclothes, sheets and blankets)?: A  Little Difficulty moving from lying on back to sitting on the side of the bed? : A Little Difficulty sitting down on and standing up from a chair with arms (e.g., wheelchair, bedside commode, etc,.)?: Unable Help needed moving to and from a bed to chair (including a wheelchair)?: A Little Help needed walking in hospital room?: A Lot Help needed climbing 3-5 steps with a railing? : Total 6 Click Score: 13    End of Session Equipment Utilized During Treatment: Gait belt Activity Tolerance: Patient tolerated treatment well;Other (comment) Patient left: in chair;with chair alarm set Nurse Communication: Mobility status PT Visit Diagnosis: Muscle weakness (generalized) (M62.81);Other abnormalities of gait and mobility (R26.89)     Time: [id number] PT Time Calculation (min) (ACUTE ONLY): 23 min  Charges:  $Gait Training: 8-22 mins $Therapeutic Exercise: 8-22 mins                    G Codes:       [name], PT, DPT [phone]    [name] 01/26/2018, 11:48 AM

## 2018-01-26 NOTE — Clinical Social Work Placement (Signed)
   CLINICAL SOCIAL WORK PLACEMENT  NOTE  Date:  01/26/2018  Patient Details  Name: [name] MRN: [id number] Date of Birth: April 14, 1941  Clinical Social Work is seeking post-discharge placement for this patient at the Skilled  Nursing Facility level of care (*CSW will initial, date and re-position this form in  chart as items are completed):  Yes   Patient/family provided with [place] Clinical Social Work Department's list of facilities offering this level of care within the geographic area requested by the patient (or if unable, by the patient's family).  Yes   Patient/family informed of their freedom to choose among providers that offer the needed level of care, that participate in Medicare, Medicaid or managed care program needed by the patient, have an available bed and are willing to accept the patient.  Yes   Patient/family informed of [facility] ownership interest [facility] and [facility], as well as of the fact that they are under no obligation to receive care at these facilities.  PASRR submitted to EDS on       PASRR number received on       Existing PASRR number confirmed on 01/23/18     FL2 transmitted to all facilities in geographic area requested by pt/family on 01/23/18     FL2 transmitted to all facilities within larger geographic area on       Patient informed that his/her managed care company has contracts with or will negotiate with certain facilities, including the following:        Yes   Patient/family informed of bed offers received.  Patient chooses bed at [facility] )     Physician recommends and patient chooses bed at      Patient to be transferred to [facility] ) on 01/26/18.  Patient to be transferred to facility by [facility] )     Patient family notified on 01/26/18 of transfer.  Name of family member notified:  (CSW left patient's wife [name] a voicemail making her awre of D/C today. )      PHYSICIAN       Additional Comment:    _______________________________________________ Flecia Shutter, Darleen CrockerBailey M, LCSW 01/26/2018, 12:48 PM

## 2018-01-26 NOTE — Progress Notes (Addendum)
[employer] - [place] [facility]                                                                                                                                                                                  Patient Demographics   [name], is a 77 y.o. male, DOB - 1941/09/11, UJW:[id number]  Admit date - 01/21/2018   Admitting Physician Juanell FairlyKevin Krasinski, MD  Outpatient Primary MD for the patient is Lyndon CodeKhan, Fozia M, MD   LOS - 5  Subjective: Patient seen and evaluated today Is comfortable and has good appetite Decreased pain in the right lower extremity No fever, chills    Review of Systems:   CONSTITUTIONAL: No documented fever. No fatigue, weakness. No weight gain, no weight loss.  EYES: No blurry or double vision.  ENT: No tinnitus. No postnasal drip. No redness of the oropharynx.  RESPIRATORY: No cough, no wheeze, no hemoptysis. No dyspnea.  CARDIOVASCULAR: No chest pain. No orthopnea. No palpitations. No syncope.  GASTROINTESTINAL: No nausea, no vomiting or diarrhea. No abdominal pain. No melena or hematochezia.  GENITOURINARY: No dysuria or hematuria.  ENDOCRINE: No polyuria or nocturia. No heat or cold intolerance.  HEMATOLOGY: No anemia. No bruising. No bleeding.  INTEGUMENTARY: No rashes. No lesions.  MUSCULOSKELETAL: Decreased right leg pain NEUROLOGIC: No numbness, tingling, or ataxia. No seizure-type activity.  PSYCHIATRIC: No anxiety. No insomnia. No ADD.    Vitals:   Vitals:   01/25/18 1651 01/25/18 1948 01/25/18 2333 01/26/18 0757  BP: 133/72 126/63 133/69 (!) 116/91  Pulse: 86 94 90 (!) 103  Resp:   17 18  Temp: 99.2 F (37.3 C)  98.6 F (37 C) 97.9 F (36.6 C)  TempSrc: Oral  Oral Oral  SpO2: 97%  94% 93%  Weight:      Height:        Wt Readings from Last 3 Encounters:  01/22/18 104.8 kg (231 lb)  01/02/18 107.7 kg (237 lb 6.4 oz)  11/28/17 108.5 kg (239 lb 3.2 oz)     Intake/Output Summary (Last 24 hours) at 01/26/2018  [id number] Last data filed at 01/25/2018 2334 Gross per 24 hour  Intake 840 ml  Output 800 ml  Net 40 ml    Physical Exam:   GENERAL: Pleasant-appearing in no apparent distress.  HEAD, EYES, EARS, NOSE AND THROAT: Atraumatic, normocephalic. Extraocular muscles are intact. Pupils equal and reactive to light. Sclerae anicteric. No conjunctival injection. No oro-pharyngeal erythema.  NECK: Supple. There is no jugular venous distention. No bruits, no lymphadenopathy, no thyromegaly.  HEART: Regular rate and rhythm,. No murmurs, no rubs, no clicks.  LUNGS: Clear to auscultation bilaterally. No rales or  rhonchi. No wheezes.  ABDOMEN: Soft, flat, nontender, nondistended. Has good bowel sounds. No hepatosplenomegaly appreciated.  EXTREMITIES: No evidence of any cyanosis, clubbing, or peripheral edema.  +2 pedal and radial pulses bilaterally.  S/p right leg surgery with cast NEUROLOGIC: The patient is alert, awake, and oriented x3 with no focal motor or sensory deficits appreciated bilaterally.  SKIN: Moist and warm with no rashes appreciated.  Psych: Not anxious, depressed LN: No inguinal LN enlargement    Antibiotics   Anti-infectives (From admission, onward)   Start     Dose/Rate Route Frequency Ordered Stop   01/22/18 2000  clindamycin (CLEOCIN) IVPB 600 mg     600 mg 100 mL/hr over 30 Minutes Intravenous Every 6 hours 01/22/18 1637 01/23/18 0424   01/22/18 1300  clindamycin (CLEOCIN) IVPB 600 mg     600 mg 100 mL/hr over 30 Minutes Intravenous  Once 01/22/18 1251 01/22/18 1359   01/22/18 1256  clindamycin (CLEOCIN) 600 MG/50ML IVPB    Note to Pharmacy:  Dellis Filbert   : cabinet override      01/22/18 1256 01/22/18 2018   01/21/18 2330  clindamycin (CLEOCIN) IVPB 900 mg     900 mg 100 mL/hr over 30 Minutes Intravenous  Once 01/21/18 2315 01/22/18 0601      Medications   Scheduled Meds: . aspirin EC  81 mg Oral QPM  . atorvastatin  40 mg Oral QHS  . benazepril  40 mg Oral Daily   . docusate sodium  100 mg Oral BID  . enoxaparin (LOVENOX) injection  40 mg Subcutaneous Q24H  . hydrochlorothiazide  25 mg Oral Daily  . insulin glargine  35 Units Subcutaneous Daily  . insulin regular  0-22 Units Subcutaneous TID AC & HS  . insulin regular  10 Units Subcutaneous TID AC  . pantoprazole  40 mg Oral Daily  . thyroid  120 mg Oral QAC breakfast  . traMADol  50 mg Oral Q6H   Continuous Infusions: . 0.9 % NaCl with KCl 20 mEq / L Stopped (01/25/18 0655)  . methocarbamol (ROBAXIN)  IV     PRN Meds:.alum & mag hydroxide-simeth, bisacodyl, diphenhydrAMINE, HYDROmorphone (DILAUDID) injection, magnesium citrate, methocarbamol **OR** methocarbamol (ROBAXIN)  IV, ondansetron **OR** ondansetron (ZOFRAN) IV, oxyCODONE, oxyCODONE, polyethylene glycol   Data Review:   Micro Results Recent Results (from the past 240 hour(s))  Urine culture     Status: None   Collection Time: 01/22/18 12:25 AM  Result Value Ref Range Status   Specimen Description   Final    URINE, RANDOM Performed at [facility], [address], [place], [place] [address]    Special Requests   Final    NONE Performed at [facility], [address], [place], [place] [address]    Culture   Final    NO GROWTH Performed at [facility], [address] [place], [place] [address]    Report Status 01/23/2018 FINAL  Final  Surgical PCR screen     Status: None   Collection Time: 01/22/18 12:25 AM  Result Value Ref Range Status   MRSA, PCR NEGATIVE NEGATIVE Final   Staphylococcus aureus NEGATIVE NEGATIVE Final    Comment: (NOTE) The Xpert SA Assay (FDA approved for NASAL specimens in patients 23 years of age and older), is one component of a comprehensive surveillance program. It is not intended to diagnose infection nor to guide or monitor treatment. Performed at [facility], [address], [place], [place]  [address]     Radiology Reports Dg Tibia/fibula  Right  Result Date: 01/22/2018 CLINICAL DATA:  Postop EXAM: RIGHT TIBIA AND FIBULA - 2 VIEW COMPARISON:  01/21/2018 FINDINGS: Interval intramedullary rod and screw fixation of the tibia across distal tibial shaft fracture with less than 1/4 bone with of lateral and posterior displacement of distal fracture fragment, improved since prior. Acute displaced proximal fibular shaft fracture with about 1/2 bone with of lateral and anterior displacement of distal fracture fragment. IMPRESSION: 1. Interval surgical fixation of distal tibial fracture with decreased displacement 2. Acute proximal fibular fracture also with decreased displacement. Electronically Signed   By: Jasmine Pang M.D.   On: 01/22/2018 16:44   Dg Tibia/fibula Right  Result Date: 01/22/2018 CLINICAL DATA:  Intraoperative fixation EXAM: RIGHT TIBIA AND FIBULA - 2 VIEW; DG C-ARM 61-120 MIN COMPARISON:  January 21, 2018 FLUOROSCOPY TIME:  3 minutes 30 seconds; dose 5.5 mGy; 6 acquired images FINDINGS: Frontal and lateral views obtained. There is screw and rod fixation through a fracture of the junction of the mid and distal thirds of the tibia. Alignment is essentially anatomic postoperatively. There is a fracture of the proximal fibular diaphysis with slight lateral displacement distally. No dislocation evident. Visualized joint spaces appear unremarkable. IMPRESSION: Postoperative fixation of fracture of the junction of the mid and distal thirds of the tibia with alignment essentially anatomic. Mildly displaced fracture proximal fibula. Electronically Signed   By: Bretta Bang III M.D.   On: 01/22/2018 16:11   Dg Tibia/fibula Right  Result Date: 01/21/2018 CLINICAL DATA:  Fall in [place].  Right lower leg pain. EXAM: RIGHT TIBIA AND FIBULA - 2 VIEW COMPARISON:  None. FINDINGS: There is a displaced slightly comminuted oblique fracture of the proximal fibular diaphysis with 1 shaft's with of anterior and lateral displacement of the  distal fragment. There is an oblique displaced fracture of the distal tibial diaphysis with 1/2 shaft's with posterolateral displacement of the distal fragment. Mild medial and posterior angulation of the distal fragment. Degenerative changes of the knee. Significant degenerative changes of the mid and hindfoot. Loss of height of the calcaneus which may be acute or chronic. Small vessel atherosclerotic disease is present. IMPRESSION: Displaced oblique fracture of the distal tibial diaphysis and displaced oblique slightly comminuted fracture of the proximal fibular diaphysis. Loss of height of the calcaneus which may be from acute or chronic fracture. Electronically Signed   By: Elberta Fortis M.D.   On: 01/21/2018 20:42   Dg Ankle Complete Right  Result Date: 01/21/2018 CLINICAL DATA:  Fall in [place]. EXAM: RIGHT ANKLE - COMPLETE 3+ VIEW COMPARISON:  None. FINDINGS: There is a oblique displaced fracture of the distal tibial diaphysis with mild medial and posterior angulation of the distal fragment and 1/2 chest with of displacement. Ankle mortise is within normal. Loss of height of the calcaneus with subtle horizontal lucency over the posterior calcaneus on the lateral film and as these findings may be due to acute or chronic fracture. Degenerative changes of the mid to hindfoot. Small vessel atherosclerotic disease is present. IMPRESSION: Displaced oblique fracture of the distal tibial diaphysis. Loss of height of the calcaneus with subtle horizontal lucency over the posterior calcaneus as findings may be due to an acute or chronic fracture. Electronically Signed   By: Elberta Fortis M.D.   On: 01/21/2018 20:44   Ct Foot Right Wo Contrast  Result Date: 01/23/2018 CLINICAL DATA:  Fall 2 days ago. Third and fourth metatarsal fractures.  Tibial fracture ORIF yesterday. EXAM: CT OF THE RIGHT FOOT WITHOUT CONTRAST TECHNIQUE: Multidetector CT imaging of the right foot was performed according to the standard  protocol. Multiplanar CT image reconstructions were also generated. COMPARISON:  Right foot x-rays dated January 21, 2018. FINDINGS: Bones/Joint/Cartilage There is a nondisplaced intra-articular fracture of the posterior malleolus. No articular surface step-off. There are acute fractures of the third and fourth metatarsal necks with slight lateral displacement and plantar angulation. There are nondisplaced fractures at the bases of the second, third, and fourth metatarsals. There is a slightly displaced fracture at the distal and lateral aspect of the middle cuneiform. Lisfranc alignment is maintained. There is a mildly displaced, intra-articular fracture at the medial base of the first proximal phalanx. The talar dome is intact. The ankle mortise is symmetric. Chronic, depressed fracture of the calcaneus with evidence of pseudoarticulation between the calcaneus and fibula. Old healed fracture of the distal fifth metatarsal shaft. Mild osteoarthritis of the subtalar, talonavicular, and calcaneocuboid joints. Trace tibiotalar joint effusion. Ligaments Suboptimally assessed by CT. Muscles and Tendons Prominent fatty atrophy of the intrinsic foot musculature the visualized flexor, extensor, and peroneal tendons are grossly intact. Soft tissues Moderate soft tissue swelling about the hindfoot. Mild soft tissue swelling over the dorsum of the forefoot. No drainable fluid collection. IMPRESSION: 1. Acute, nondisplaced intra-articular fracture of the posterior malleolus without articular surface step-off. 2. Acute, mildly displaced fractures of the third and fourth metatarsal necks. 3. Acute, nondisplaced fractures at the bases of the second, third, and fourth metatarsals. Lisfranc alignment is maintained. 4. Acute, minimally displaced fracture of the middle cuneiform. 5. Acute, mildly displaced fracture at the medial base of the first proximal phalanx. 6. Chronic, healed, depressed fracture of the calcaneus with evidence of  resultant lateral hindfoot impingement. Electronically Signed   By: Obie Dredge M.D.   On: 01/23/2018 10:28   Chest Portable 1 View  Result Date: 01/21/2018 CLINICAL DATA:  Preoperative radiograph EXAM: PORTABLE CHEST 1 VIEW COMPARISON:  Chest radiograph 05/23/2017 FINDINGS: Unchanged cardiomegaly. No focal airspace consolidation or pulmonary edema. No pneumothorax or pleural effusion. IMPRESSION: No active disease. Electronically Signed   By: Deatra Robinson M.D.   On: 01/21/2018 23:45   Dg Foot 2 Views Right  Result Date: 01/21/2018 CLINICAL DATA:  Dorsal foot pain after fall in kitchen tonight. EXAM: RIGHT FOOT - 2 VIEW COMPARISON:  None. FINDINGS: Examination demonstrates diffuse osteopenia. There are degenerative changes over the first MTP joint. There are degenerative changes of the interphalangeal joints as well as over the mid to hindfoot region. There is a displaced transverse fracture at the base of the head of the third and fourth metatarsals. There is loss of height of the calcaneus with subtle horizontal lucency over the posterior calcaneus as this may be due to acute or chronic fracture. Small vessel atherosclerotic disease is present. IMPRESSION: Minimally displaced transverse fractures at the base of the head of the third and fourth metatarsals. Loss of height of the calcaneus with subtle horizontal lucency over the superior portion of the calcaneus which may be due to acute or chronic fracture. Degenerative changes as described. Electronically Signed   By: Elberta Fortis M.D.   On: 01/21/2018 20:47   Dg C-arm 1-60 Min  Result Date: 01/22/2018 CLINICAL DATA:  Intraoperative fixation EXAM: RIGHT TIBIA AND FIBULA - 2 VIEW; DG C-ARM 61-120 MIN COMPARISON:  January 21, 2018 FLUOROSCOPY TIME:  3 minutes 30 seconds; dose 5.5 mGy; 6 acquired images FINDINGS: Frontal and  lateral views obtained. There is screw and rod fixation through a fracture of the junction of the mid and distal thirds of the  tibia. Alignment is essentially anatomic postoperatively. There is a fracture of the proximal fibular diaphysis with slight lateral displacement distally. No dislocation evident. Visualized joint spaces appear unremarkable. IMPRESSION: Postoperative fixation of fracture of the junction of the mid and distal thirds of the tibia with alignment essentially anatomic. Mildly displaced fracture proximal fibula. Electronically Signed   By: Bretta Bang III M.D.   On: 01/22/2018 16:11     CBC Recent Labs  Lab 01/21/18 1921 01/23/18 0456 01/24/18 0428 01/25/18 0459  WBC 7.2 7.5 7.7 7.7  HGB 13.2 10.8* 10.4* 10.6*  HCT 39.7* 31.9* 30.8* 30.8*  PLT 155 123* 121* 124*  MCV 94.6 95.1 94.6 94.5  MCH 31.5 32.1 32.0 32.4  MCHC 33.3 33.8 33.8 34.3  RDW 15.4* 15.0* 15.0* 15.1*    Chemistries  Recent Labs  Lab 01/21/18 1921 01/23/18 0456 01/24/18 0428  NA 140 140 142  K 3.8 4.1 3.9  CL 105 107 106  CO2 22 26 29   GLUCOSE 183* 226* 137*  BUN 17 18 15   CREATININE 1.02 1.05 0.93  CALCIUM 8.9 8.2* 8.7*   ------------------------------------------------------------------------------------------------------------------ estimated creatinine clearance is 80.6 mL/min (by C-G formula based on SCr of 0.93 mg/dL). ------------------------------------------------------------------------------------------------------------------ No results for input(s): HGBA1C in the last 72 hours. ------------------------------------------------------------------------------------------------------------------ No results for input(s): CHOL, HDL, LDLCALC, TRIG, CHOLHDL, LDLDIRECT in the last 72 hours. ------------------------------------------------------------------------------------------------------------------ No results for input(s): TSH, T4TOTAL, T3FREE, THYROIDAB in the last 72 hours.  Invalid input(s):  FREET3 ------------------------------------------------------------------------------------------------------------------ No results for input(s): VITAMINB12, FOLATE, FERRITIN, TIBC, IRON, RETICCTPCT in the last 72 hours.  Coagulation profile Recent Labs  Lab 01/22/18 0014  INR 0.98    No results for input(s): DDIMER in the last 72 hours.  Cardiac Enzymes No results for input(s): CKMB, TROPONINI, MYOGLOBIN in the last 168 hours.  Invalid input(s): CK ------------------------------------------------------------------------------------------------------------------ Invalid input(s): POCBNP    Assessment & Plan   1. Closed fracture of right tibia and fibula -Management as per  primary orthopedic team  S/p ORIF and repair Weight bearing as per Ortho recommendations   2. Type 2 diabetes mellitus (HCC) -  Sugars under control Humulin R sliding scale lantus 35 units Sq daily  Humulin R to 10 units before meals three times daily  F/u with diabetic coordinator  3. Essential hypertension -continue home meds benazepril and hydrochlorothiazide  4.HLD (hyperlipidemia) -Lipitor to continue  5. Hypothyroidism -[place] Thyroid 120 mg once daily  6. GERD (gastroesophageal reflux disease) - PPI  7. Awaiting SNF placement , pending level 2 passr  8. DVT prophylaxis with 40 mg lovenox [place] daily    Code Status History    Date Active Date Inactive Code Status Order ID Comments User Context   01/21/2018 2315 01/22/2018 1636 Full Code [id number]  Juanell Fairly, MD ED   05/23/2017 2028 05/24/2017 1809 Full Code [id number]  Alford Highland, MD ED   05/05/2016 1553 05/05/2016 1855 Full Code [id number]  Kennedy Bucker, MD Inpatient   03/18/2014 2250 03/21/2014 1940 Full Code [id number]  Eduard Clos, MD Inpatient   08/14/2013 1539 08/20/2013 1741 Full Code [id number]  Lorretta Harp, MD Inpatient    Advance Directive Documentation     Most Recent Value  Type of Advance Directive   Healthcare Power of Attorney, Living will  Pre-existing out of facility DNR order (yellow form or pink MOST form)  -  "MOST"  Form in Place?  -     Time Spent in minutes   24 minutes  Greater than 50% of time spent in care coordination and counseling patient regarding the condition and plan of care.   Ihor Austin M.D on 01/26/2018 at 8:32 AM  Between 7am to 6pm - Pager - [phone]  After 6pm go to [email or web address] - Social research officer, government  [employer]   Office  [phone]

## 2018-01-29 DIAGNOSIS — N183 Chronic kidney disease, stage 3 (moderate): Secondary | ICD-10-CM | POA: Diagnosis not present

## 2018-01-29 DIAGNOSIS — M8000XD Age-related osteoporosis with current pathological fracture, unspecified site, subsequent encounter for fracture with routine healing: Secondary | ICD-10-CM | POA: Diagnosis not present

## 2018-01-29 DIAGNOSIS — E119 Type 2 diabetes mellitus without complications: Secondary | ICD-10-CM | POA: Diagnosis not present

## 2018-01-29 DIAGNOSIS — Z794 Long term (current) use of insulin: Secondary | ICD-10-CM | POA: Diagnosis not present

## 2018-01-29 DIAGNOSIS — I10 Essential (primary) hypertension: Secondary | ICD-10-CM | POA: Diagnosis not present

## 2018-02-06 DIAGNOSIS — S82201D Unspecified fracture of shaft of right tibia, subsequent encounter for closed fracture with routine healing: Secondary | ICD-10-CM | POA: Diagnosis not present

## 2018-02-06 DIAGNOSIS — E119 Type 2 diabetes mellitus without complications: Secondary | ICD-10-CM | POA: Diagnosis not present

## 2018-02-06 DIAGNOSIS — I10 Essential (primary) hypertension: Secondary | ICD-10-CM | POA: Diagnosis not present

## 2018-02-06 DIAGNOSIS — R062 Wheezing: Secondary | ICD-10-CM | POA: Diagnosis not present

## 2018-02-06 DIAGNOSIS — R05 Cough: Secondary | ICD-10-CM | POA: Diagnosis not present

## 2018-02-12 DIAGNOSIS — S82231D Displaced oblique fracture of shaft of right tibia, subsequent encounter for closed fracture with routine healing: Secondary | ICD-10-CM | POA: Diagnosis not present

## 2018-02-13 ENCOUNTER — Telehealth: Payer: Self-pay

## 2018-02-13 DIAGNOSIS — I10 Essential (primary) hypertension: Secondary | ICD-10-CM | POA: Diagnosis not present

## 2018-02-13 DIAGNOSIS — S82201D Unspecified fracture of shaft of right tibia, subsequent encounter for closed fracture with routine healing: Secondary | ICD-10-CM | POA: Diagnosis not present

## 2018-02-13 DIAGNOSIS — R062 Wheezing: Secondary | ICD-10-CM | POA: Diagnosis not present

## 2018-02-13 DIAGNOSIS — E119 Type 2 diabetes mellitus without complications: Secondary | ICD-10-CM | POA: Diagnosis not present

## 2018-02-13 NOTE — Telephone Encounter (Signed)
TRY TO CALL PT VOICE MAIL IS FULL HIS PAPER WOR READY FOR PICKUP

## 2018-02-13 NOTE — Telephone Encounter (Signed)
Pt advised paper work  Is ready for pickup

## 2018-02-15 ENCOUNTER — Telehealth: Payer: Self-pay | Admitting: Internal Medicine

## 2018-02-20 ENCOUNTER — Ambulatory Visit: Payer: Self-pay | Admitting: Internal Medicine

## 2018-02-21 NOTE — Telephone Encounter (Signed)
Faxed paperwork updated per [facility] to dept of transportation. Advised pt original copy here for pick up. Beth

## 2018-03-09 ENCOUNTER — Other Ambulatory Visit: Payer: Self-pay | Admitting: Internal Medicine

## 2018-03-12 DIAGNOSIS — S82231D Displaced oblique fracture of shaft of right tibia, subsequent encounter for closed fracture with routine healing: Secondary | ICD-10-CM | POA: Diagnosis not present

## 2018-04-09 DIAGNOSIS — S82231D Displaced oblique fracture of shaft of right tibia, subsequent encounter for closed fracture with routine healing: Secondary | ICD-10-CM | POA: Diagnosis not present

## 2018-04-13 DIAGNOSIS — R69 Illness, unspecified: Secondary | ICD-10-CM | POA: Diagnosis not present

## 2018-04-17 ENCOUNTER — Ambulatory Visit: Payer: Self-pay | Admitting: Internal Medicine

## 2018-04-19 ENCOUNTER — Ambulatory Visit (INDEPENDENT_AMBULATORY_CARE_PROVIDER_SITE_OTHER): Payer: Medicare HMO | Admitting: Nurse Practitioner

## 2018-04-19 ENCOUNTER — Encounter: Payer: Self-pay | Admitting: Nurse Practitioner

## 2018-04-19 VITALS — BP 145/69 | HR 72 | Resp 16 | Ht 71.0 in | Wt 231.2 lb

## 2018-04-19 DIAGNOSIS — E11649 Type 2 diabetes mellitus with hypoglycemia without coma: Secondary | ICD-10-CM

## 2018-04-19 DIAGNOSIS — E039 Hypothyroidism, unspecified: Secondary | ICD-10-CM

## 2018-04-19 DIAGNOSIS — I10 Essential (primary) hypertension: Secondary | ICD-10-CM | POA: Diagnosis not present

## 2018-04-19 DIAGNOSIS — Z794 Long term (current) use of insulin: Secondary | ICD-10-CM

## 2018-04-19 DIAGNOSIS — E1165 Type 2 diabetes mellitus with hyperglycemia: Secondary | ICD-10-CM | POA: Diagnosis not present

## 2018-04-19 DIAGNOSIS — S8291XD Unspecified fracture of right lower leg, subsequent encounter for closed fracture with routine healing: Secondary | ICD-10-CM

## 2018-04-19 LAB — POCT GLYCOSYLATED HEMOGLOBIN (HGB A1C): HEMOGLOBIN A1C: 6.2 % — AB (ref 4.0–5.6)

## 2018-04-19 NOTE — Progress Notes (Signed)
[facility] [address] [place], [place] [address]  Internal MEDICINE  Office Visit Note  Patient Name: [name]  [id number]  [id number]  Date of Service: 05/09/2018   Pt is here for routine follow up.   Chief Complaint  Patient presents with  . Follow-up    rehab   . Diabetes    The patient has recently been released from rehab services after significant fracture to right tib/fib. His leg is healig well. He is able to walk without significant limp. Will continue to see orthopedics as indicated.   Diabetes  He presents for his follow-up diabetic visit. He has type 2 diabetes mellitus. No MedicAlert identification noted. His disease course has been stable. There are no hypoglycemic associated symptoms. Pertinent negatives for hypoglycemia include no dizziness, headaches, nervousness/anxiousness or tremors. Associated symptoms include fatigue and visual change. Pertinent negatives for diabetes include no chest pain, no polydipsia, no polyphagia and no polyuria. Hypoglycemia complications include nocturnal hypoglycemia. Symptoms are improving. There are no diabetic complications. Risk factors for coronary artery disease include diabetes mellitus, hypertension and male sex. Current diabetic treatment includes insulin injections. He is compliant with treatment most of the time. His weight is stable. He is following a generally healthy diet. Meal planning includes avoidance of concentrated sweets. He participates in exercise intermittently. His home blood glucose trend is fluctuating minimally. An ACE inhibitor/angiotensin II receptor blocker is being taken. He sees a podiatrist.Eye exam is current.       Current Medication: Outpatient Encounter Medications as of 04/19/2018  Medication Sig  . aspirin EC 81 MG tablet Take 81 mg by mouth daily.  Marland Kitchen atorvastatin (LIPITOR) 40 MG tablet Take 40 mg by mouth daily at 6 PM.   . benazepril (LOTENSIN) 40 MG tablet Take 40 mg by  mouth daily.  . Cholecalciferol 1000 units capsule Take 1,000 Units by mouth daily.  Marland Kitchen docusate sodium (COLACE) 100 MG capsule Take 1 capsule (100 mg total) by mouth 2 (two) times daily.  Marland Kitchen enoxaparin (LOVENOX) 40 MG/0.4ML injection Inject 0.4 mLs (40 mg total) into the skin daily.  Marland Kitchen HUMULIN R 100 UNIT/ML injection Inject 0.15-0.17 mLs (15-17 Units total) into the skin 2 (two) times daily before a meal. And per sliding scale Pt is not to get novolin at all (Patient taking differently: Inject 15-17 Units into the skin 2 (two) times daily before a meal. 16 units in the morning and per sliding scale Pt is not to get novolin at all)  . hydrochlorothiazide (HYDRODIURIL) 25 MG tablet Take 25 mg by mouth daily.  . Insulin Glargine (LANTUS SOLOSTAR) 100 UNIT/ML Solostar Pen Inject 65 Units into the skin daily at 10 pm. (Patient taking differently: Inject 56 Units into the skin daily. )  . insulin glargine (LANTUS) 100 UNIT/ML injection Inject 0.56 mLs (56 Units total) into the skin daily with breakfast.  . insulin regular (HUMULIN R) 100 units/mL injection USE 17 UNITS SUBCUTANEOUSLY TWICE DAILY WITH MEALS AND USE A SLIDING SCALE IF SUGARS GET HIGH  . insulin regular (NOVOLIN R,HUMULIN R) 100 units/mL injection Inject 0.16 mLs (16 Units total) into the skin 2 (two) times daily before a meal.  . [place] Ginseng 1000 MG TABS Take 1,000 mg by mouth daily.  . Omega 3 1200 MG CAPS Take 1,200 mg by mouth daily.  . pantoprazole (PROTONIX) 40 MG tablet TAKE 1 TABLET BY MOUTH DAILY  . pyridOXINE (VITAMIN B-6) 100 MG tablet Take 100 mg by mouth daily.  Marland Kitchen  thyroid (ARMOUR) 120 MG tablet Take 120 mg by mouth daily before breakfast.  . traMADol (ULTRAM) 50 MG tablet Take 1 tablet (50 mg total) by mouth every 6 (six) hours as needed.   No facility-administered encounter medications on file as of 04/19/2018.     Surgical History: Past Surgical History:  Procedure Laterality Date  . CARPAL TUNNEL RELEASE Right 1980's   . CARPAL TUNNEL RELEASE Left 05/05/2016   Procedure: CARPAL TUNNEL RELEASE;  Surgeon: Kennedy Bucker, MD;  Location: [facility] ORS;  Service: Orthopedics;  Laterality: Left;  . CATARACT EXTRACTION W/PHACO Left 05/12/2015   Procedure: CATARACT EXTRACTION PHACO AND INTRAOCULAR LENS PLACEMENT (IOC);  Surgeon: Galen Manila, MD;  Location: [facility] OR[facility];  Service: Ophthalmology;  Laterality: Left;  [place] 01:53.1AP% 21.4CDE 24.88fluid pack lot # [id number]  . EYE SURGERY Left    cataract extractions  . INCISION AND DRAINAGE OF WOUND Left 1988   "staph infection" left leg  . INTRAMEDULLARY (IM) NAIL INTERTROCHANTERIC Right 01/22/2018   Procedure: INTRAMEDULLARY (IM) NAIL INTERTROCHANTRIC;  Surgeon: Juanell Fairly, MD;  Location: [facility];  Service: Orthopedics;  Laterality: Right;  . KNEE ARTHROPLASTY Left 12/28/2015   Procedure: COMPUTER ASSISTED TOTAL KNEE ARTHROPLASTY;  Surgeon: Donato Heinz, MD;  Location: [facility];  Service: Orthopedics;  Laterality: Left;  . ORIF WRIST FRACTURE Left 05/05/2016   Procedure: OPEN REDUCTION INTERNAL FIXATION (ORIF) WRIST FRACTURE;  Surgeon: Kennedy Bucker, MD;  Location: [facility];  Service: Orthopedics;  Laterality: Left;    Medical History: Past Medical History:  Diagnosis Date  . Agent orange exposure    "[name]"  . Arthritis    "left knee" (03/18/2014)  . Closed pelvic fracture (HCC) 03/18/2014   "multiple S/P fall w/loss of consciousness; CBG 29"  . Encephalopathy acute 08/16/2013  . GERD (gastroesophageal reflux disease)   . High cholesterol   . Hypertension   . Hypothyroidism   . Type II diabetes mellitus (HCC)     Family History: Family History  Problem Relation Age of Onset  . Colon cancer Mother   . Cerebral aneurysm Father   . Prostate cancer Neg Hx   . Bladder Cancer Neg Hx   . Kidney cancer Neg Hx     Social History   Socioeconomic History  . Marital status: Married    Spouse name: Not on file  . Number of children: Not on file  . Years of  education: Not on file  . Highest education level: Not on file  Occupational History  . Not on file  Social Needs  . Financial resource strain: Not on file  . Food insecurity:    Worry: Not on file    Inability: Not on file  . Transportation needs:    Medical: Not on file    Non-medical: Not on file  Tobacco Use  . Smoking status: Former Smoker    Packs/day: 3.00    Years: 30.00    Pack years: 90.00    Types: Cigarettes  . Smokeless tobacco: Never Used  . Tobacco comment: "quit smoking in 1988"  Substance and Sexual Activity  . Alcohol use: No  . Drug use: No  . Sexual activity: Never  Lifestyle  . Physical activity:    Days per week: Not on file    Minutes per session: Not on file  . Stress: Not on file  Relationships  . Social connections:    Talks on phone: Not on file    Gets together: Not on file  Attends religious service: Not on file    Active member of club or organization: Not on file    Attends meetings of clubs or organizations: Not on file    Relationship status: Not on file  . Intimate partner violence:    Fear of current or ex partner: Not on file    Emotionally abused: Not on file    Physically abused: Not on file    Forced sexual activity: Not on file  Other Topics Concern  . Not on file  Social History Narrative  . Not on file      Review of Systems  Constitutional: Positive for activity change and fatigue. Negative for chills and unexpected weight change.       Gradually increasing exercise after fracture to right tib/fib.  HENT: Positive for postnasal drip. Negative for congestion, rhinorrhea, sneezing and sore throat.   Eyes: Negative for redness.  Respiratory: Negative for cough, chest tightness and shortness of breath.   Cardiovascular: Negative for chest pain and palpitations.  Gastrointestinal: Negative for abdominal pain, constipation, diarrhea, nausea and vomiting.  Endocrine: Negative for cold intolerance, heat intolerance,  polydipsia, polyphagia and polyuria.       Overlla, blood sugars managed well.   Genitourinary: Negative for dysuria and frequency.  Musculoskeletal: Positive for arthralgias. Negative for back pain, joint swelling and neck pain.       Healed right tib/fib fracture. Slightly tender when walking or standing for long period of time.   Skin: Negative for rash.  Allergic/Immunologic: Negative for environmental allergies and food allergies.  Neurological: Negative for dizziness, tremors, light-headedness, numbness and headaches.  Hematological: Negative for adenopathy. Does not bruise/bleed easily.  Psychiatric/Behavioral: Positive for dysphoric mood. Negative for behavioral problems (Depression), sleep disturbance and suicidal ideas. The patient is not nervous/anxious.    Today's Vitals   04/19/18 1106  BP: (!) 145/69  Pulse: 72  Resp: 16  SpO2: 97%  Weight: 231 lb 3.2 oz (104.9 kg)  Height: 5\' 11"  (1.803 m)    Physical Exam  Constitutional: He is oriented to person, place, and time. He appears well-developed and well-nourished. No distress.  HENT:  Head: Normocephalic and atraumatic.  Mouth/Throat: Oropharynx is clear and moist. No oropharyngeal exudate.  Eyes: Pupils are equal, round, and reactive to light. Conjunctivae and EOM are normal.  Neck: Normal range of motion. Neck supple. No JVD present. No tracheal deviation present. No thyromegaly present.  Cardiovascular: Normal rate, regular rhythm and normal heart sounds. Exam reveals no gallop and no friction rub.  No murmur heard. Pulmonary/Chest: Effort normal and breath sounds normal. No respiratory distress. He has no wheezes. He has no rales. He exhibits no tenderness.  Abdominal: Soft. Bowel sounds are normal. There is no tenderness.  Musculoskeletal: Normal range of motion.       Legs: Lymphadenopathy:    He has no cervical adenopathy.  Neurological: He is alert and oriented to person, place, and time. No cranial nerve  deficit.  Skin: Skin is warm and dry. He is not diaphoretic.  Psychiatric: He has a normal mood and affect. His behavior is normal. Judgment and thought content normal.  Nursing note and vitals reviewed.   Assessment/Plan: 1. Type 2 diabetes mellitus with hyperglycemia, with long-term current use of insulin (HCC) - POCT HgB A1C 6.3 today. Continue insulin regimen as proscribed.   2. Closed fracture of right lower leg with routine healing, subsequent encounter Reviewed hospital and rehabilitation records. Will continue to monitor.   3. Hypothyroidism,  unspecified type Continue thyroid medications as prescribed   4. Essential hypertension Stable. Continue bp mediation as prescribed.   General Counseling: [name] verbalizes understanding of the findings of todays visit and agrees with plan of treatment. I have discussed any further diagnostic evaluation that may be needed or ordered today. We also reviewed his medications today. he has been encouraged to call the office with any questions or concerns that should arise related to todays visit.    Counseling:  This patient was seen by Vincent GrosHeather Marcellas Marchant, FNP- C in Collaboration with Dr Lyndon CodeFozia M Khan as a part of collaborative care agreement   Orders Placed This Encounter  Procedures  . POCT HgB A1C      Time spent: 20 Minutes     Dr Lyndon CodeFozia M Khan Internal medicine

## 2018-05-09 DIAGNOSIS — S8291XA Unspecified fracture of right lower leg, initial encounter for closed fracture: Secondary | ICD-10-CM | POA: Insufficient documentation

## 2018-05-15 ENCOUNTER — Other Ambulatory Visit: Payer: Self-pay | Admitting: Internal Medicine

## 2018-05-25 ENCOUNTER — Other Ambulatory Visit: Payer: Self-pay | Admitting: Internal Medicine

## 2018-05-29 DIAGNOSIS — E1165 Type 2 diabetes mellitus with hyperglycemia: Secondary | ICD-10-CM | POA: Diagnosis not present

## 2018-05-29 DIAGNOSIS — I10 Essential (primary) hypertension: Secondary | ICD-10-CM | POA: Diagnosis not present

## 2018-05-29 DIAGNOSIS — E890 Postprocedural hypothyroidism: Secondary | ICD-10-CM | POA: Diagnosis not present

## 2018-05-29 DIAGNOSIS — E785 Hyperlipidemia, unspecified: Secondary | ICD-10-CM | POA: Diagnosis not present

## 2018-05-29 DIAGNOSIS — E0520 Thyrotoxicosis with toxic multinodular goiter without thyrotoxic crisis or storm: Secondary | ICD-10-CM | POA: Diagnosis not present

## 2018-05-29 DIAGNOSIS — E063 Autoimmune thyroiditis: Secondary | ICD-10-CM | POA: Diagnosis not present

## 2018-06-04 DIAGNOSIS — S82231D Displaced oblique fracture of shaft of right tibia, subsequent encounter for closed fracture with routine healing: Secondary | ICD-10-CM | POA: Diagnosis not present

## 2018-07-09 ENCOUNTER — Other Ambulatory Visit: Payer: Self-pay | Admitting: Internal Medicine

## 2018-07-09 DIAGNOSIS — R69 Illness, unspecified: Secondary | ICD-10-CM | POA: Diagnosis not present

## 2018-07-10 DIAGNOSIS — R69 Illness, unspecified: Secondary | ICD-10-CM | POA: Diagnosis not present

## 2018-07-17 ENCOUNTER — Encounter: Payer: Self-pay | Admitting: Adult Health

## 2018-07-17 ENCOUNTER — Ambulatory Visit (INDEPENDENT_AMBULATORY_CARE_PROVIDER_SITE_OTHER): Payer: Medicare HMO | Admitting: Adult Health

## 2018-07-17 VITALS — BP 145/66 | HR 85 | Resp 16 | Ht 71.0 in | Wt 224.6 lb

## 2018-07-17 DIAGNOSIS — I10 Essential (primary) hypertension: Secondary | ICD-10-CM

## 2018-07-17 DIAGNOSIS — Z794 Long term (current) use of insulin: Secondary | ICD-10-CM | POA: Diagnosis not present

## 2018-07-17 DIAGNOSIS — E08649 Diabetes mellitus due to underlying condition with hypoglycemia without coma: Secondary | ICD-10-CM

## 2018-07-17 DIAGNOSIS — Z23 Encounter for immunization: Secondary | ICD-10-CM

## 2018-07-17 DIAGNOSIS — E782 Mixed hyperlipidemia: Secondary | ICD-10-CM | POA: Diagnosis not present

## 2018-07-17 LAB — POCT GLYCOSYLATED HEMOGLOBIN (HGB A1C): Hemoglobin A1C: 6.3 % — AB (ref 4.0–5.6)

## 2018-07-17 NOTE — Progress Notes (Signed)
[facility] [address] [place], [place] [address]  Internal MEDICINE  Office Visit Note  Patient Name: [name]  [id number]  [id number]  Date of Service: 07/24/2018   HPI  Pt here for follow up on diabetes.  His blood sugar log is present.  He takes his sugar 4 times daily.  His 4am sugar is consistently below 100 mg/dl.  He then eats his breakfast which is most often a toaster strudel.  His 8am sugar is then 122-184mg /dl. His other sugars in the day, are well controlled.  His A1c 3 months ago was 6.2. And it is 6.3 today.  He has been doing well, denies further issues at this time.         Current Medication: Outpatient Encounter Medications as of 07/17/2018  Medication Sig  . aspirin EC 81 MG tablet Take 81 mg by mouth daily.  Marland Kitchen atorvastatin (LIPITOR) 40 MG tablet TAKE 1 TABLET BY MOUTH EVERY DAY  . benazepril (LOTENSIN) 40 MG tablet Take 40 mg by mouth daily.  . Cholecalciferol 1000 units capsule Take 1,000 Units by mouth daily.  Marland Kitchen docusate sodium (COLACE) 100 MG capsule Take 1 capsule (100 mg total) by mouth 2 (two) times daily.  Marland Kitchen enoxaparin (LOVENOX) 40 MG/0.4ML injection Inject 0.4 mLs (40 mg total) into the skin daily.  Marland Kitchen HUMULIN R 100 UNIT/ML injection Inject 0.15-0.17 mLs (15-17 Units total) into the skin 2 (two) times daily before a meal. And per sliding scale Pt is not to get novolin at all (Patient taking differently: Inject 15-17 Units into the skin 2 (two) times daily before a meal. 16 units in the morning and per sliding scale Pt is not to get novolin at all)  . hydrochlorothiazide (HYDRODIURIL) 25 MG tablet Take 25 mg by mouth daily.  . Insulin Glargine (LANTUS SOLOSTAR) 100 UNIT/ML Solostar Pen Inject 65 Units into the skin daily at 10 pm. (Patient taking differently: Inject 56 Units into the skin daily. )  . insulin glargine (LANTUS) 100 UNIT/ML injection Inject 0.56 mLs (56 Units total) into the skin daily with breakfast.  . insulin regular  (HUMULIN R) 100 units/mL injection USE 17 UNITS SUBCUTANEOUSLY TWICE DAILY WITH MEALS AND USE A SLIDING SCALE IF SUGARS GET HIGH  . [place] Ginseng 1000 MG TABS Take 1,000 mg by mouth daily.  . Omega 3 1200 MG CAPS Take 1,200 mg by mouth daily.  . ONE TOUCH ULTRA TEST test strip CHECK SUGAR 5 X A DAY FOR DX 250.3 & FLUCTUATING SUGAR  . pantoprazole (PROTONIX) 40 MG tablet TAKE 1 TABLET BY MOUTH DAILY  . pyridOXINE (VITAMIN B-6) 100 MG tablet Take 100 mg by mouth daily.  Marland Kitchen thyroid (ARMOUR) 120 MG tablet Take 120 mg by mouth daily before breakfast.  . traMADol (ULTRAM) 50 MG tablet Take 1 tablet (50 mg total) by mouth every 6 (six) hours as needed.  . [DISCONTINUED] insulin regular (NOVOLIN R,HUMULIN R) 100 units/mL injection Inject 0.16 mLs (16 Units total) into the skin 2 (two) times daily before a meal.   No facility-administered encounter medications on file as of 07/17/2018.     Surgical History: Past Surgical History:  Procedure Laterality Date  . CARPAL TUNNEL RELEASE Right 1980's  . CARPAL TUNNEL RELEASE Left 05/05/2016   Procedure: CARPAL TUNNEL RELEASE;  Surgeon: Kennedy Bucker, MD;  Location: [facility];  Service: Orthopedics;  Laterality: Left;  . CATARACT EXTRACTION W/PHACO Left 05/12/2015   Procedure: CATARACT EXTRACTION PHACO AND INTRAOCULAR LENS PLACEMENT (  IOC);  Surgeon: Galen Manila, MD;  Location: [facility];  Service: Ophthalmology;  Laterality: Left;  [place] 01:53.1AP% 21.4CDE 24.80fluid pack lot # [id number]  . EYE SURGERY Left    cataract extractions  . INCISION AND DRAINAGE OF WOUND Left 1988   "staph infection" left leg  . INTRAMEDULLARY (IM) NAIL INTERTROCHANTERIC Right 01/22/2018   Procedure: INTRAMEDULLARY (IM) NAIL INTERTROCHANTRIC;  Surgeon: Juanell Fairly, MD;  Location: [facility];  Service: Orthopedics;  Laterality: Right;  . KNEE ARTHROPLASTY Left 12/28/2015   Procedure: COMPUTER ASSISTED TOTAL KNEE ARTHROPLASTY;  Surgeon: Donato Heinz, MD;  Location: [facility];   Service: Orthopedics;  Laterality: Left;  . ORIF WRIST FRACTURE Left 05/05/2016   Procedure: OPEN REDUCTION INTERNAL FIXATION (ORIF) WRIST FRACTURE;  Surgeon: Kennedy Bucker, MD;  Location: [facility];  Service: Orthopedics;  Laterality: Left;    Medical History: Past Medical History:  Diagnosis Date  . Agent orange exposure    "[name]"  . Arthritis    "left knee" (03/18/2014)  . Closed pelvic fracture (HCC) 03/18/2014   "multiple S/P fall w/loss of consciousness; CBG 29"  . Encephalopathy acute 08/16/2013  . GERD (gastroesophageal reflux disease)   . High cholesterol   . Hypertension   . Hypothyroidism   . Type II diabetes mellitus (HCC)     Family History: Family History  Problem Relation Age of Onset  . Colon cancer Mother   . Cerebral aneurysm Father   . Prostate cancer Neg Hx   . Bladder Cancer Neg Hx   . Kidney cancer Neg Hx     Social History   Socioeconomic History  . Marital status: Married    Spouse name: Not on file  . Number of children: Not on file  . Years of education: Not on file  . Highest education level: Not on file  Occupational History  . Not on file  Social Needs  . Financial resource strain: Not on file  . Food insecurity:    Worry: Not on file    Inability: Not on file  . Transportation needs:    Medical: Not on file    Non-medical: Not on file  Tobacco Use  . Smoking status: Former Smoker    Packs/day: 3.00    Years: 30.00    Pack years: 90.00    Types: Cigarettes  . Smokeless tobacco: Never Used  . Tobacco comment: "quit smoking in 1988"  Substance and Sexual Activity  . Alcohol use: No  . Drug use: No  . Sexual activity: Never  Lifestyle  . Physical activity:    Days per week: Not on file    Minutes per session: Not on file  . Stress: Not on file  Relationships  . Social connections:    Talks on phone: Not on file    Gets together: Not on file    Attends religious service: Not on file    Active member of club or  organization: Not on file    Attends meetings of clubs or organizations: Not on file    Relationship status: Not on file  . Intimate partner violence:    Fear of current or ex partner: Not on file    Emotionally abused: Not on file    Physically abused: Not on file    Forced sexual activity: Not on file  Other Topics Concern  . Not on file  Social History Narrative  . Not on file      Review of Systems  Constitutional: Negative.  Negative for chills, fatigue and unexpected weight change.  HENT: Negative.  Negative for congestion, rhinorrhea, sneezing and sore throat.   Eyes: Negative for redness.  Respiratory: Negative.  Negative for cough, chest tightness and shortness of breath.   Cardiovascular: Negative.  Negative for chest pain and palpitations.  Gastrointestinal: Negative.  Negative for abdominal pain, constipation, diarrhea, nausea and vomiting.  Endocrine: Negative.   Genitourinary: Negative.  Negative for dysuria and frequency.  Musculoskeletal: Negative.  Negative for arthralgias, back pain, joint swelling and neck pain.  Skin: Negative.  Negative for rash.  Allergic/Immunologic: Negative.   Neurological: Negative.  Negative for tremors and numbness.  Hematological: Negative for adenopathy. Does not bruise/bleed easily.  Psychiatric/Behavioral: Negative.  Negative for behavioral problems, sleep disturbance and suicidal ideas. The patient is not nervous/anxious.     Vital Signs: BP (!) 145/66 (BP Location: Right Arm, Patient Position: Sitting, Cuff Size: Large)   Pulse 85   Resp 16   Ht 5\' 11"  (1.803 m)   Wt 224 lb 9.6 oz (101.9 kg)   SpO2 96%   BMI 31.33 kg/m    Physical Exam  Constitutional: He is oriented to person, place, and time. He appears well-developed and well-nourished. No distress.  HENT:  Head: Normocephalic and atraumatic.  Mouth/Throat: Oropharynx is clear and moist. No oropharyngeal exudate.  Eyes: Pupils are equal, round, and reactive to  light. EOM are normal.  Neck: Normal range of motion. Neck supple. No JVD present. No tracheal deviation present. No thyromegaly present.  Cardiovascular: Normal rate, regular rhythm and normal heart sounds. Exam reveals no gallop and no friction rub.  No murmur heard. Pulmonary/Chest: Effort normal and breath sounds normal. No respiratory distress. He has no wheezes. He has no rales. He exhibits no tenderness.  Abdominal: Soft. There is no tenderness. There is no guarding.  Musculoskeletal: Normal range of motion.  Lymphadenopathy:    He has no cervical adenopathy.  Neurological: He is alert and oriented to person, place, and time. No cranial nerve deficit.  Skin: Skin is warm and dry. He is not diaphoretic.  Psychiatric: He has a normal mood and affect. His behavior is normal. Judgment and thought content normal.  Nursing note and vitals reviewed.  Assessment/Plan: 1. Uncontrolled type 2 diabetes mellitus with hypoglycemia, unspecified hypoglycemia coma status (HCC) Continue medications as prescribed.  Discussed patients diet, talked about ways to cut down on sugar intake.   - POCT HgB A1C  2. Essential hypertension Repeat BP 138/64 while in office.  HTN controlled, continue medications at this time.   3. Mixed hyperlipidemia Continue medication as directed.   4. Needs flu shot - Flu Vaccine MDCK QUAD PF  General Counseling: Hrithik verbalizes understanding of the findings of todays visit and agrees with plan of treatment. I have discussed any further diagnostic evaluation that may be needed or ordered today. We also reviewed his medications today. he has been encouraged to call the office with any questions or concerns that should arise related to todays visit.  Diabetes Counseling:  1. Addition of ACE inh/ ARB'S for nephroprotection. Microalbumin is updated  2. Diabetic foot care, prevention of complications. Podiatry consult 3. Exercise and lose weight.  4. Diabetic eye  examination, Diabetic eye exam is updated  5. Monitor blood sugar closlely. nutrition counseling.  6. Sign and symptoms of hypoglycemia including shaking sweating,confusion and headaches. He tends to over treat himself. Instructed to avoid using extra insulin    Orders Placed This Encounter  Procedures  .  Flu Vaccine MDCK QUAD PF  . POCT HgB A1C    Time spent: 25 Minutes   This patient was seen by Blima LedgerAdam Brylen Wagar AGNP-C in Collaboration with Dr Lyndon CodeFozia M Khan as a part of collaborative care agreement    Dr Lyndon CodeFozia M Khan Internal medicine

## 2018-07-17 NOTE — Patient Instructions (Signed)
Diabetes Mellitus and Nutrition When you have diabetes (diabetes mellitus), it is very important to have healthy eating habits because your blood sugar (glucose) levels are greatly affected by what you eat and drink. Eating healthy foods in the appropriate amounts, at about the same times every day, can help you:  Control your blood glucose.  Lower your risk of heart disease.  Improve your blood pressure.  Reach or maintain a healthy weight.  Every person with diabetes is different, and each person has different needs for a meal plan. Your health care provider may recommend that you work with a diet and nutrition specialist (dietitian) to make a meal plan that is best for you. Your meal plan may vary depending on factors such as:  The calories you need.  The medicines you take.  Your weight.  Your blood glucose, blood pressure, and cholesterol levels.  Your activity level.  Other health conditions you have, such as heart or kidney disease.  How do carbohydrates affect me? Carbohydrates affect your blood glucose level more than any other type of food. Eating carbohydrates naturally increases the amount of glucose in your blood. Carbohydrate counting is a method for keeping track of how many carbohydrates you eat. Counting carbohydrates is important to keep your blood glucose at a healthy level, especially if you use insulin or take certain oral diabetes medicines. It is important to know how many carbohydrates you can safely have in each meal. This is different for every person. Your dietitian can help you calculate how many carbohydrates you should have at each meal and for snack. Foods that contain carbohydrates include:  Bread, cereal, rice, pasta, and crackers.  Potatoes and corn.  Peas, beans, and lentils.  Milk and yogurt.  Fruit and juice.  Desserts, such as cakes, cookies, ice cream, and candy.  How does alcohol affect me? Alcohol can cause a sudden decrease in blood  glucose (hypoglycemia), especially if you use insulin or take certain oral diabetes medicines. Hypoglycemia can be a life-threatening condition. Symptoms of hypoglycemia (sleepiness, dizziness, and confusion) are similar to symptoms of having too much alcohol. If your health care provider says that alcohol is safe for you, follow these guidelines:  Limit alcohol intake to no more than 1 drink per day for nonpregnant women and 2 drinks per day for men. One drink equals 12 oz of beer, 5 oz of wine, or 1 oz of hard liquor.  Do not drink on an empty stomach.  Keep yourself hydrated with water, diet soda, or unsweetened iced tea.  Keep in mind that regular soda, juice, and other mixers may contain a lot of sugar and must be counted as carbohydrates.  What are tips for following this plan? Reading food labels  Start by checking the serving size on the label. The amount of calories, carbohydrates, fats, and other nutrients listed on the label are based on one serving of the food. Many foods contain more than one serving per package.  Check the total grams (g) of carbohydrates in one serving. You can calculate the number of servings of carbohydrates in one serving by dividing the total carbohydrates by 15. For example, if a food has 30 g of total carbohydrates, it would be equal to 2 servings of carbohydrates.  Check the number of grams (g) of saturated and trans fats in one serving. Choose foods that have low or no amount of these fats.  Check the number of milligrams (mg) of sodium in one serving. Most people   should limit total sodium intake to less than 2,300 mg per day.  Always check the nutrition information of foods labeled as "low-fat" or "nonfat". These foods may be higher in added sugar or refined carbohydrates and should be avoided.  Talk to your dietitian to identify your daily goals for nutrients listed on the label. Shopping  Avoid buying canned, premade, or processed foods. These  foods tend to be high in fat, sodium, and added sugar.  Shop around the outside edge of the grocery store. This includes fresh fruits and vegetables, bulk grains, fresh meats, and fresh dairy. Cooking  Use low-heat cooking methods, such as baking, instead of high-heat cooking methods like deep frying.  Cook using healthy oils, such as olive, canola, or sunflower oil.  Avoid cooking with butter, cream, or high-fat meats. Meal planning  Eat meals and snacks regularly, preferably at the same times every day. Avoid going long periods of time without eating.  Eat foods high in fiber, such as fresh fruits, vegetables, beans, and whole grains. Talk to your dietitian about how many servings of carbohydrates you can eat at each meal.  Eat 4-6 ounces of lean protein each day, such as lean meat, chicken, fish, eggs, or tofu. 1 ounce is equal to 1 ounce of meat, chicken, or fish, 1 egg, or 1/4 cup of tofu.  Eat some foods each day that contain healthy fats, such as avocado, nuts, seeds, and fish. Lifestyle   Check your blood glucose regularly.  Exercise at least 30 minutes 5 or more days each week, or as told by your health care provider.  Take medicines as told by your health care provider.  Do not use any products that contain nicotine or tobacco, such as cigarettes and e-cigarettes. If you need help quitting, ask your health care provider.  Work with a counselor or diabetes educator to identify strategies to manage stress and any emotional and social challenges. What are some questions to ask my health care provider?  Do I need to meet with a diabetes educator?  Do I need to meet with a dietitian?  What number can I call if I have questions?  When are the best times to check my blood glucose? Where to find more information:  American Diabetes Association: diabetes.org/food-and-fitness/food  Academy of Nutrition and Dietetics:  www.eatright.org/resources/health/diseases-and-conditions/diabetes  National Institute of Diabetes and Digestive and Kidney Diseases (NIH): www.niddk.nih.gov/health-information/diabetes/overview/diet-eating-physical-activity Summary  A healthy meal plan will help you control your blood glucose and maintain a healthy lifestyle.  Working with a diet and nutrition specialist (dietitian) can help you make a meal plan that is best for you.  Keep in mind that carbohydrates and alcohol have immediate effects on your blood glucose levels. It is important to count carbohydrates and to use alcohol carefully. This information is not intended to replace advice given to you by your health care provider. Make sure you discuss any questions you have with your health care provider. Document Released: 07/14/2005 Document Revised: 11/21/2016 Document Reviewed: 11/21/2016 Elsevier Interactive Patient Education  2018 Elsevier Inc.  

## 2018-07-27 ENCOUNTER — Ambulatory Visit: Payer: Self-pay | Admitting: Nurse Practitioner

## 2018-08-03 DIAGNOSIS — R69 Illness, unspecified: Secondary | ICD-10-CM | POA: Diagnosis not present

## 2018-08-08 ENCOUNTER — Other Ambulatory Visit: Payer: Self-pay | Admitting: Internal Medicine

## 2018-09-05 DIAGNOSIS — R69 Illness, unspecified: Secondary | ICD-10-CM | POA: Diagnosis not present

## 2018-09-21 DIAGNOSIS — R69 Illness, unspecified: Secondary | ICD-10-CM | POA: Diagnosis not present

## 2018-09-23 ENCOUNTER — Other Ambulatory Visit: Payer: Self-pay | Admitting: Internal Medicine

## 2018-09-30 IMAGING — DX DG ANKLE COMPLETE 3+V*R*
3 series · 3 of 3 positions shown · non-contrast
Comparison: None.

CLINICAL DATA: Fall in kitchen tonight.

EXAM:
RIGHT ANKLE - COMPLETE 3+ VIEW

[ankle ap]
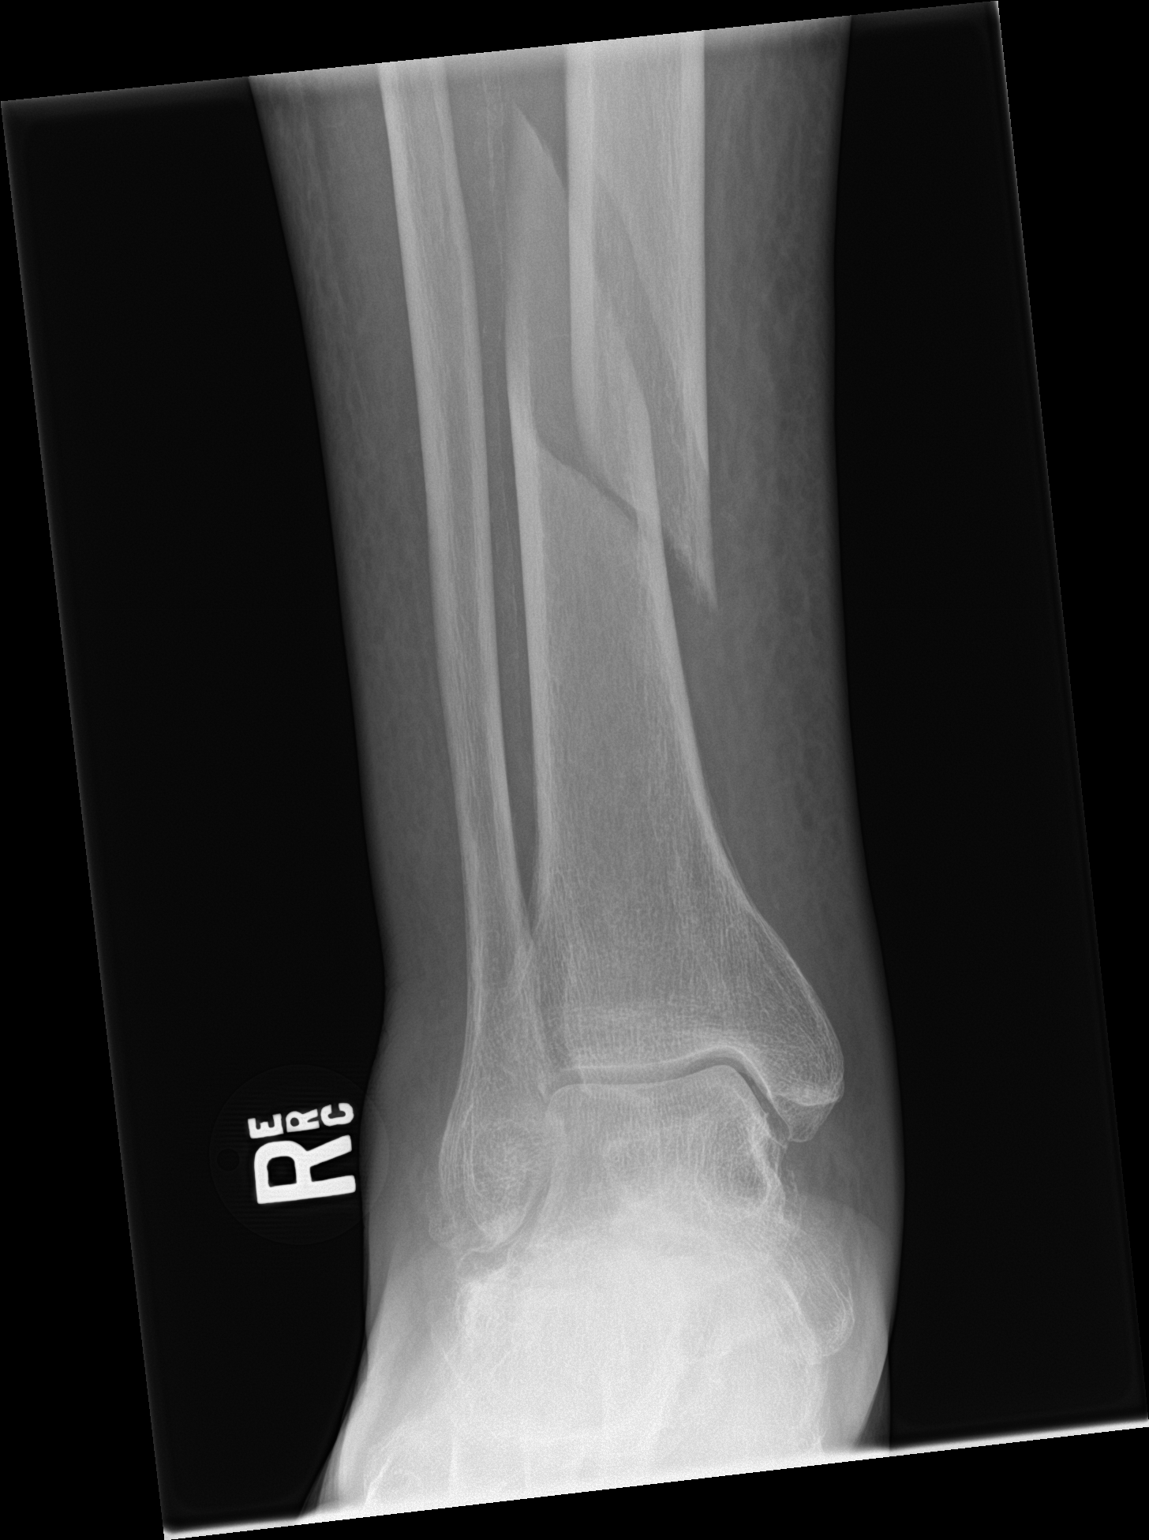

[ankle obl]
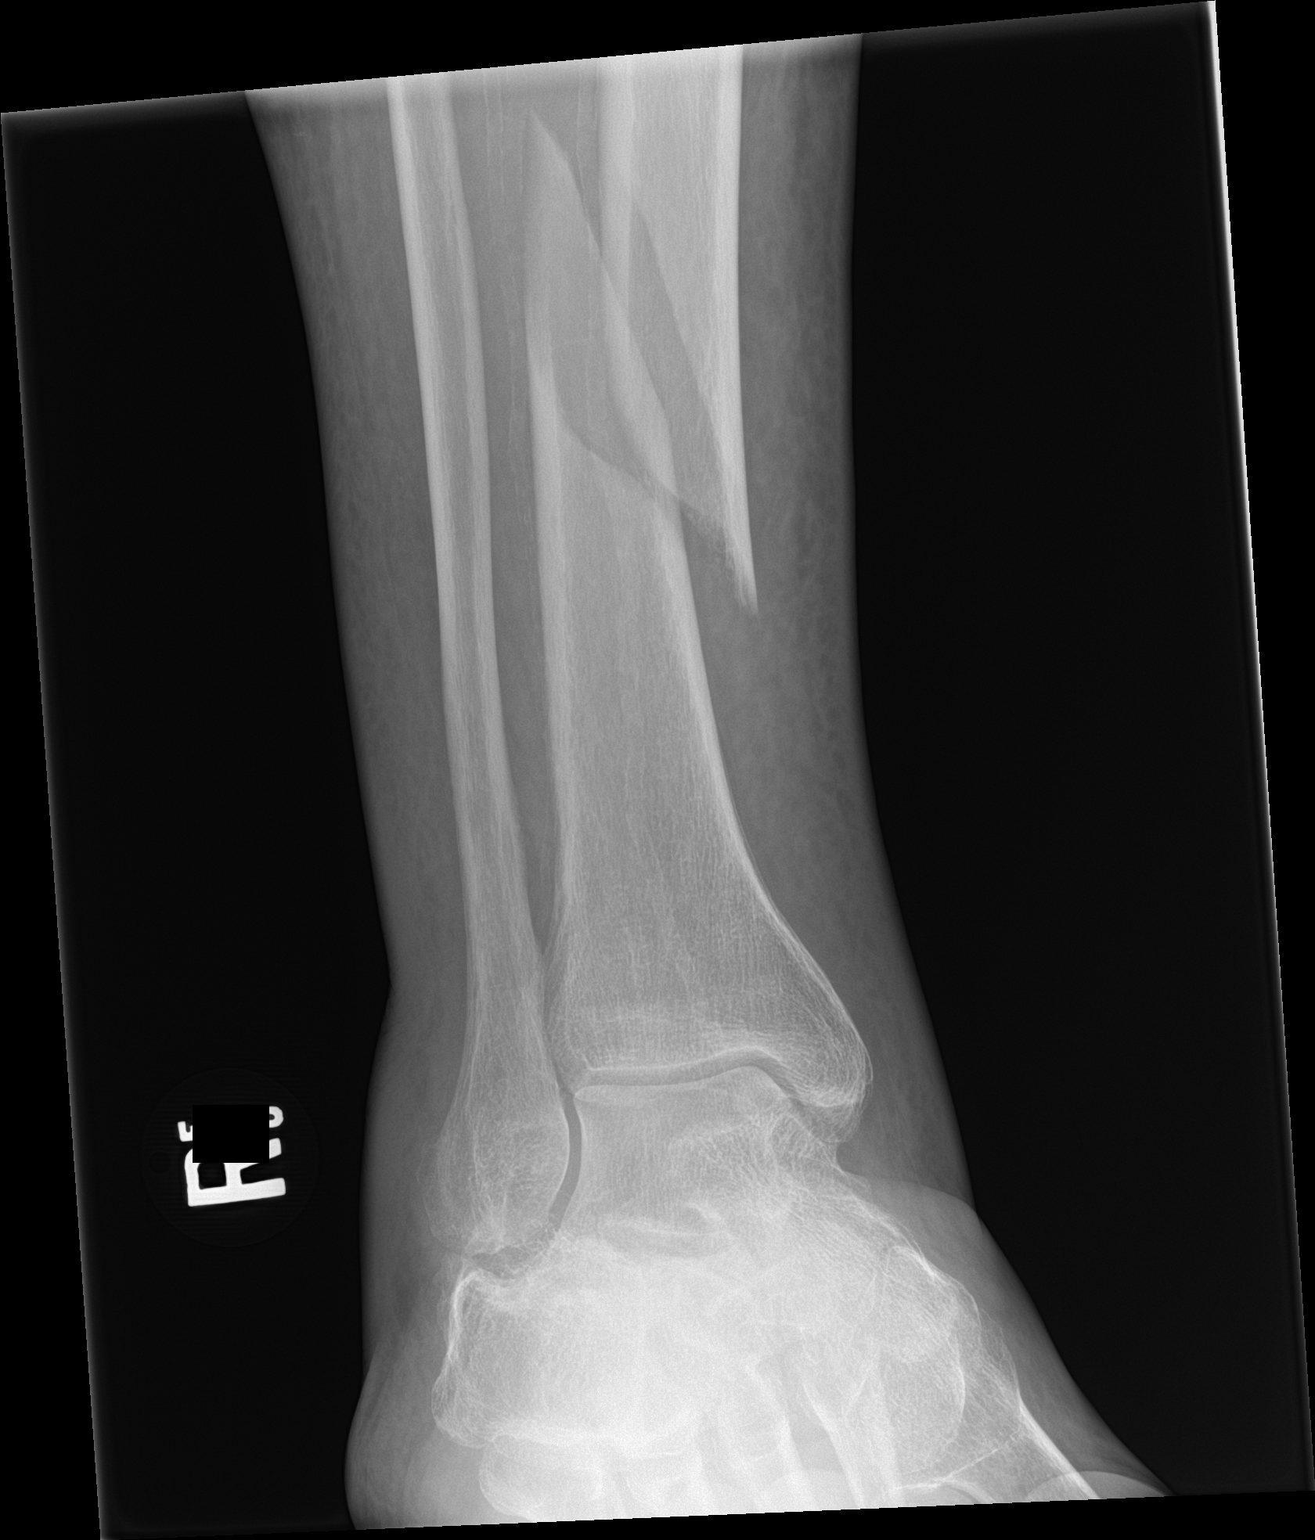

[ankle lat]
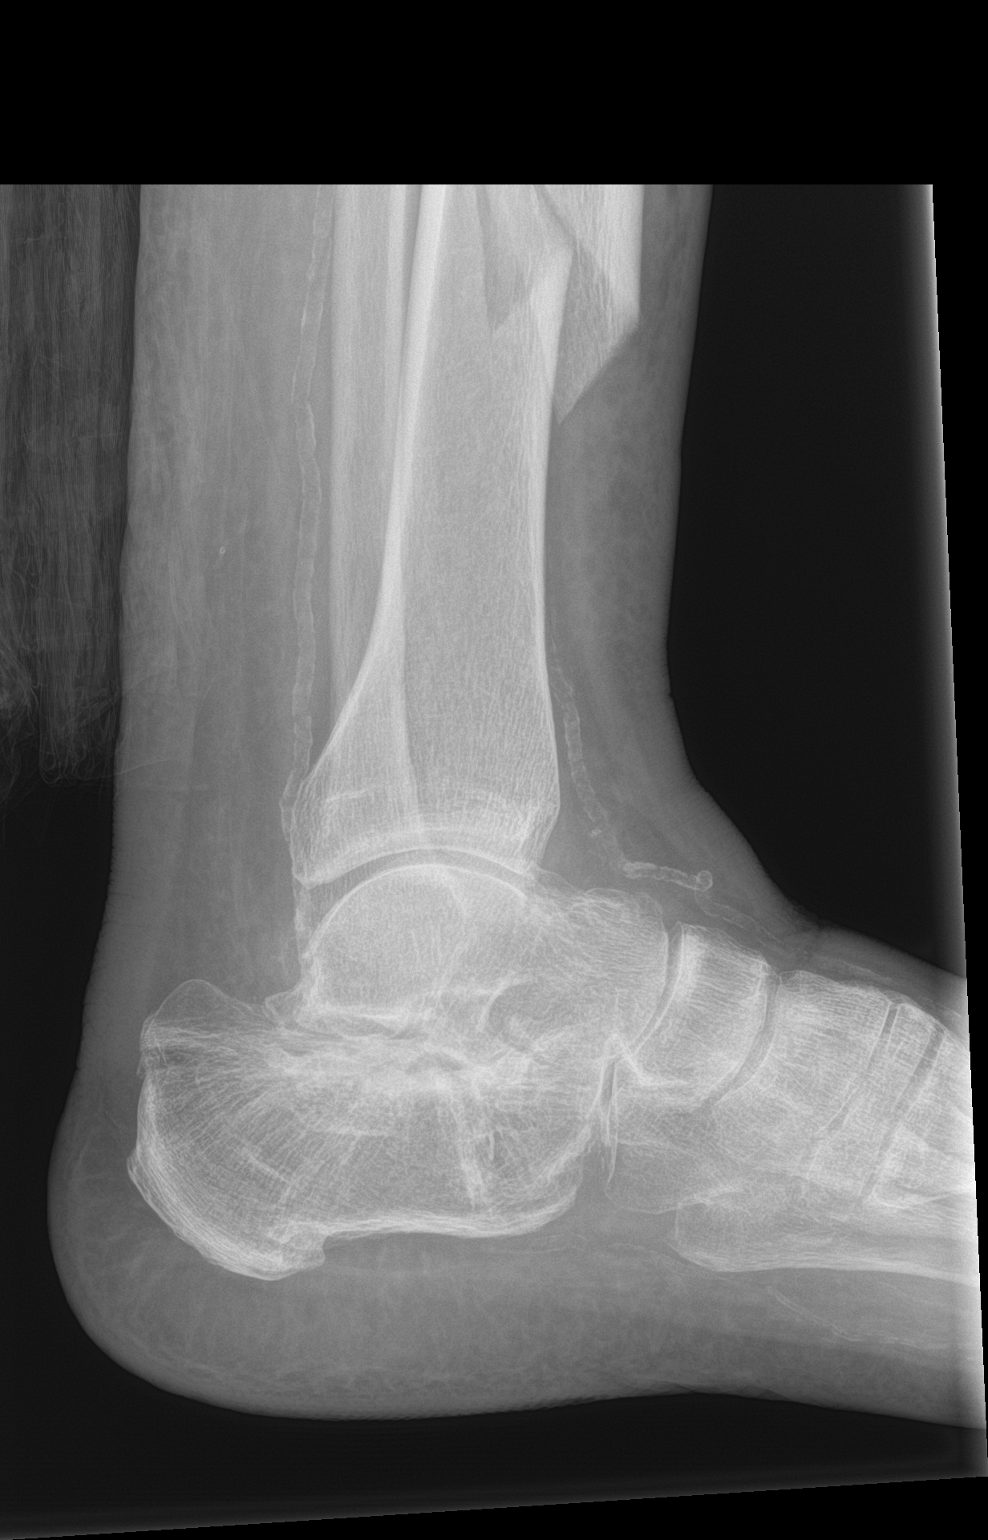

[3 of 3 positions shown; findings below may reference images not displayed]

FINDINGS: There is a oblique displaced fracture of the distal tibial diaphysis
with mild medial and posterior angulation of the distal fragment and
[DATE] chest with of displacement. Ankle mortise is within normal. Loss
of height of the calcaneus with subtle horizontal lucency over the
posterior calcaneus on the lateral film and as these findings may be
due to acute or chronic fracture. Degenerative changes of the mid to
hindfoot. Small vessel atherosclerotic disease is present.
IMPRESSION: Displaced oblique fracture of the distal tibial diaphysis.

Loss of height of the calcaneus with subtle horizontal lucency over
the posterior calcaneus as findings may be due to an acute or
chronic fracture.

## 2018-09-30 IMAGING — DX DG CHEST 1V PORT
1 series · 1 of 1 positions shown · non-contrast
Comparison: Chest radiograph 05/23/2017

CLINICAL DATA: Preoperative radiograph

EXAM:
PORTABLE CHEST 1 VIEW

[chest ap]
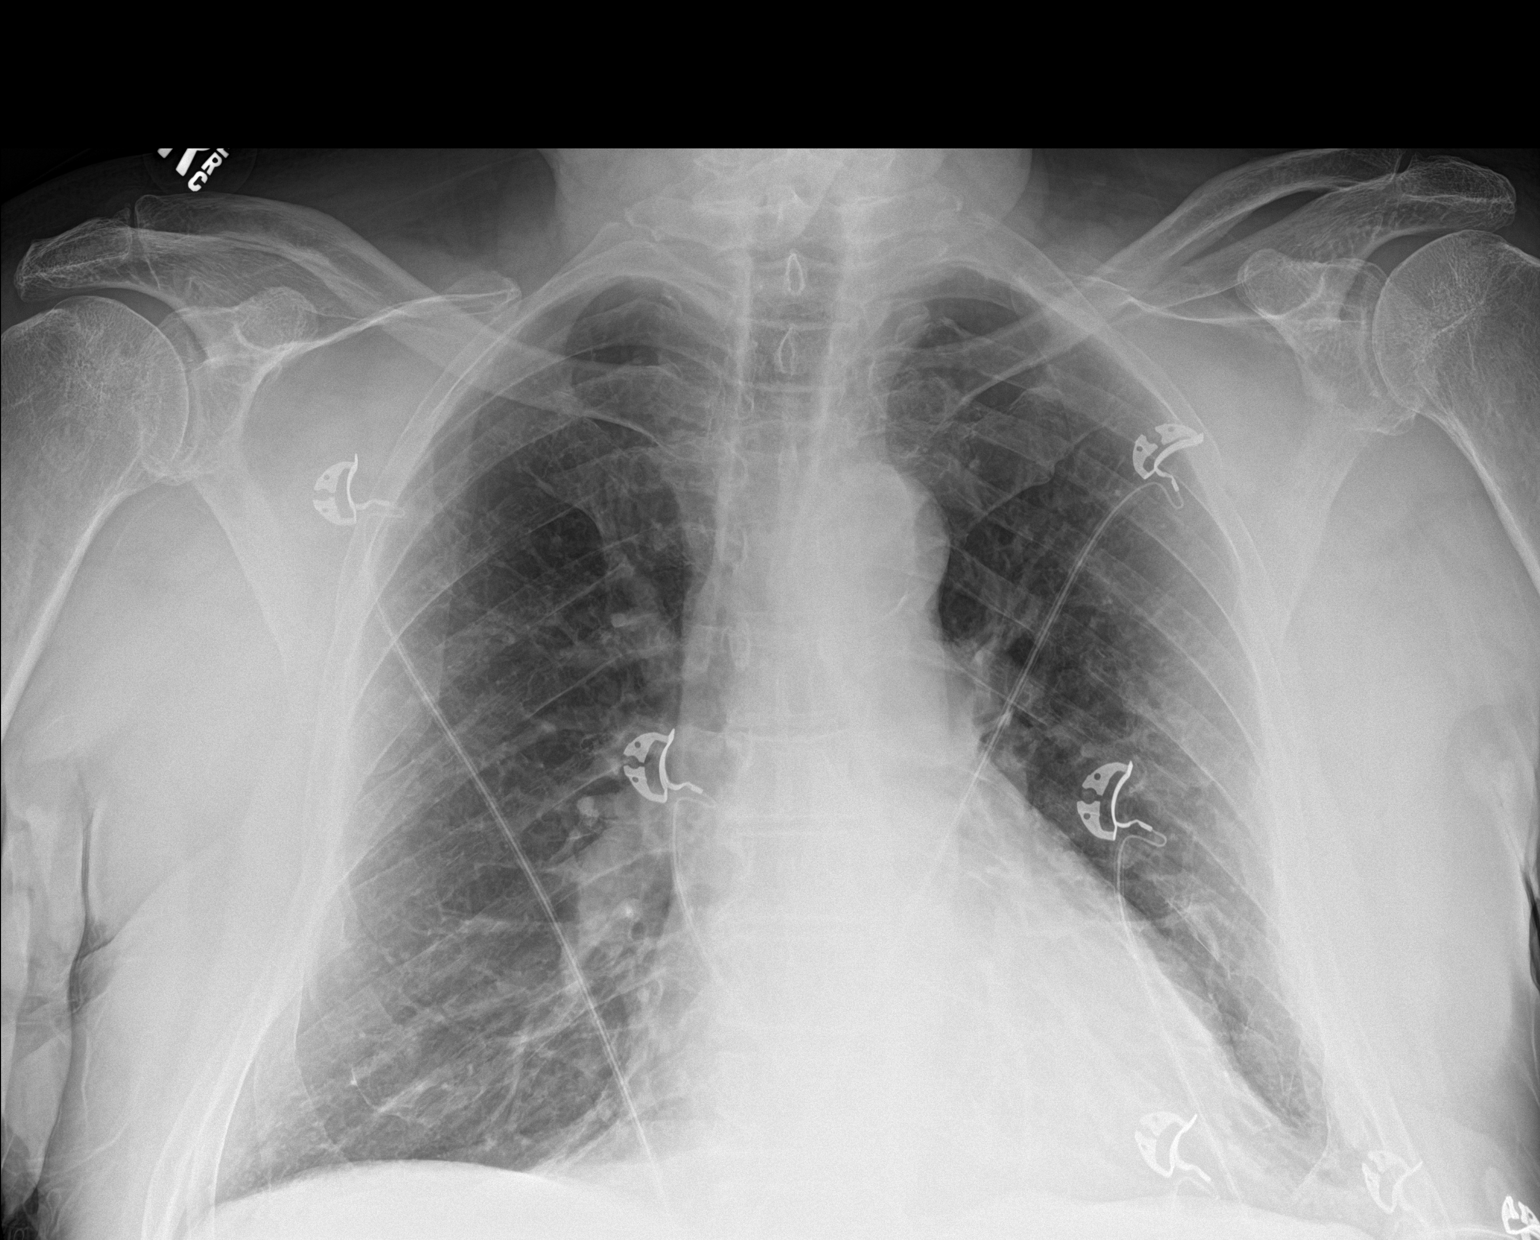

[1 of 1 positions shown; findings below may reference images not displayed]

FINDINGS: Unchanged cardiomegaly. No focal airspace consolidation or pulmonary
edema. No pneumothorax or pleural effusion.
IMPRESSION: No active disease.

## 2018-10-01 IMAGING — XA DG C-ARM 61-120 MIN
6 series · 6 of 6 positions shown · non-contrast
Comparison: January 21, 2018

FLUOROSCOPY TIME:  3 minutes 30 seconds; dose 5.5 mGy; 6 acquired
images

CLINICAL DATA: Intraoperative fixation

EXAM:
RIGHT TIBIA AND FIBULA - 2 VIEW; DG C-ARM 61-120 MIN

[Series 8: cont. · 1 of 1 slices shown (1 of 6)]
[im 1/1]
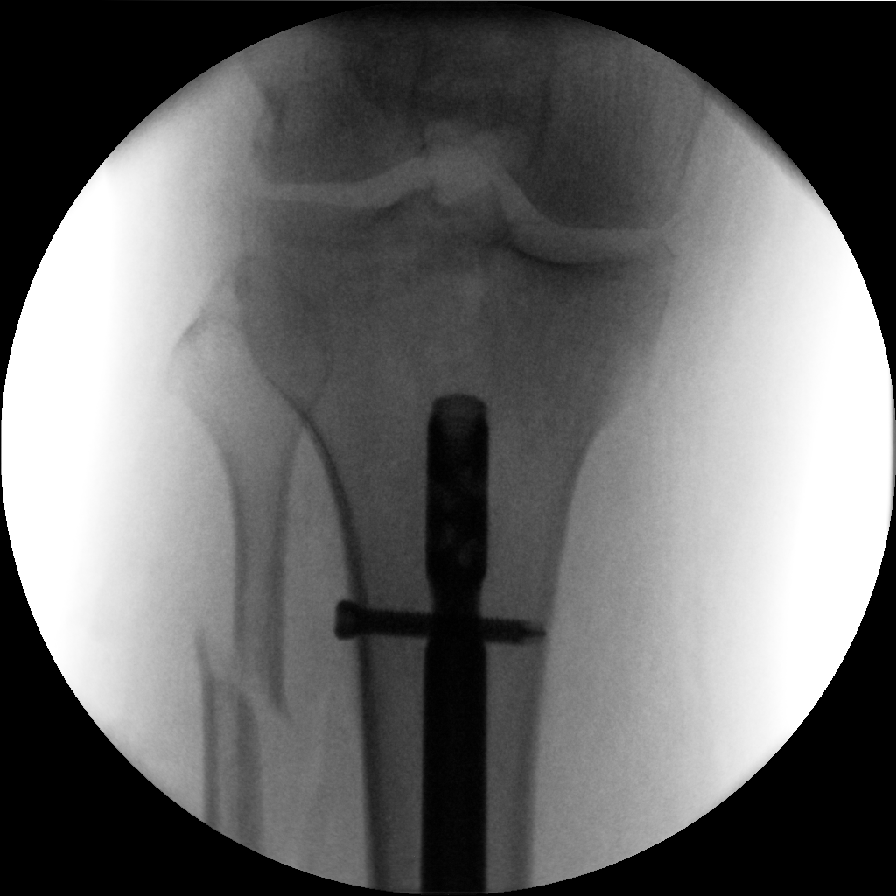

[Series 9: cont. · 1 of 1 slices shown (2 of 6)]
[im 1/1]
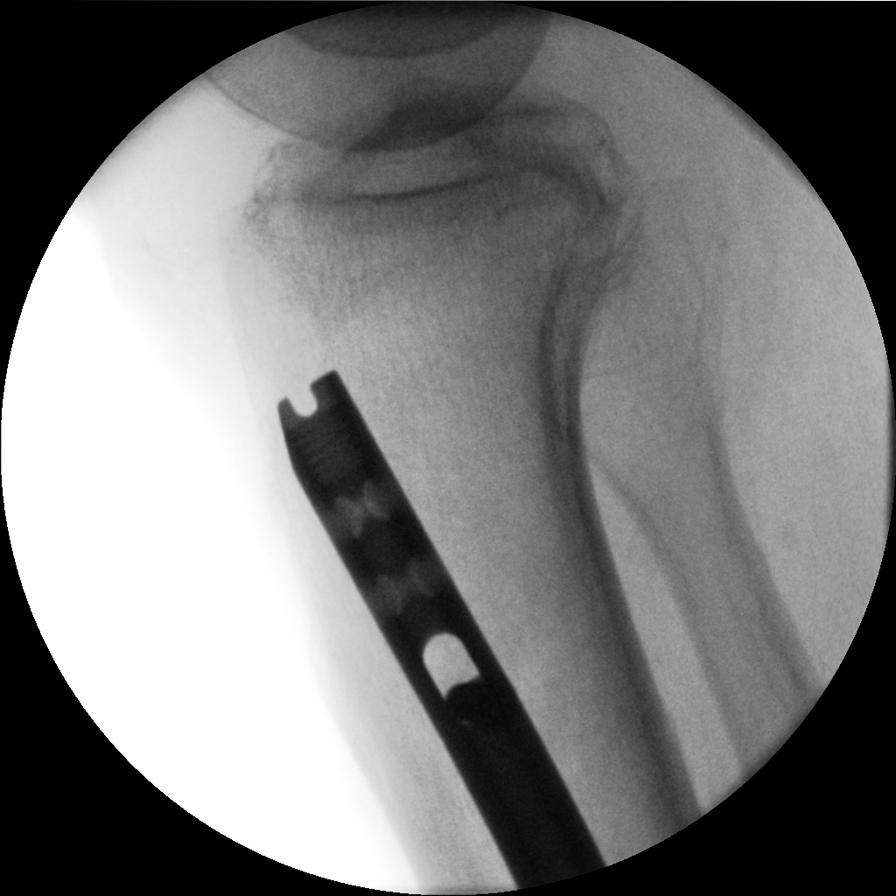

[Series 10: cont. · 1 of 1 slices shown (3 of 6)]
[im 1/1]
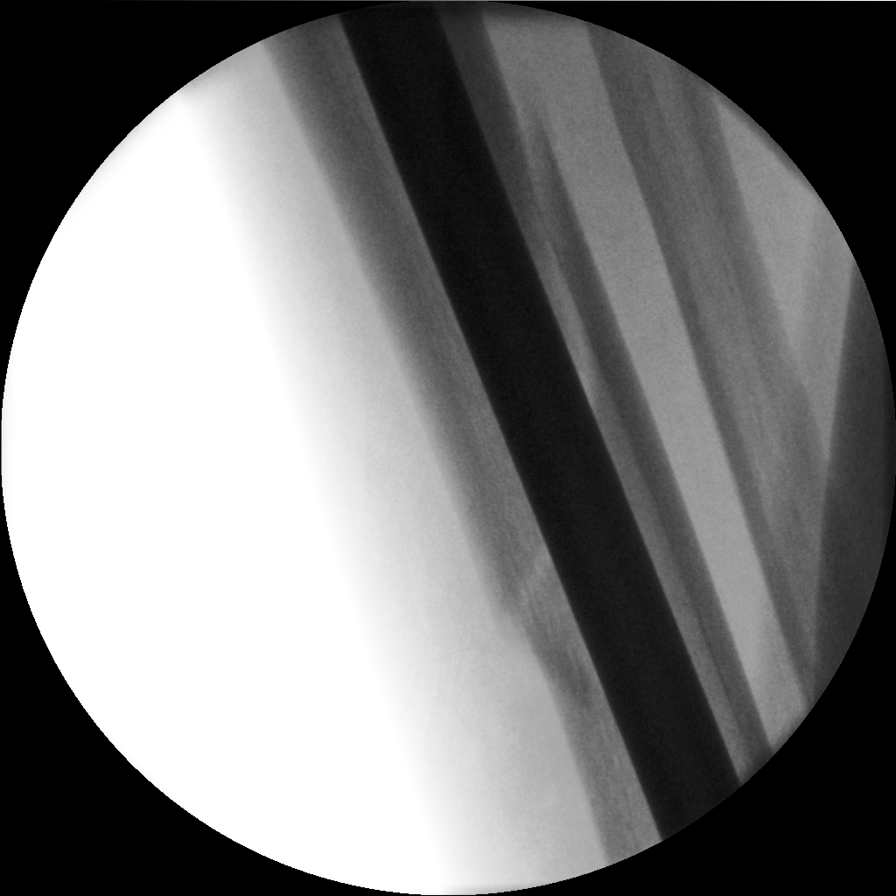

[Series 11: cont. · 1 of 1 slices shown (4 of 6)]
[im 1/1]
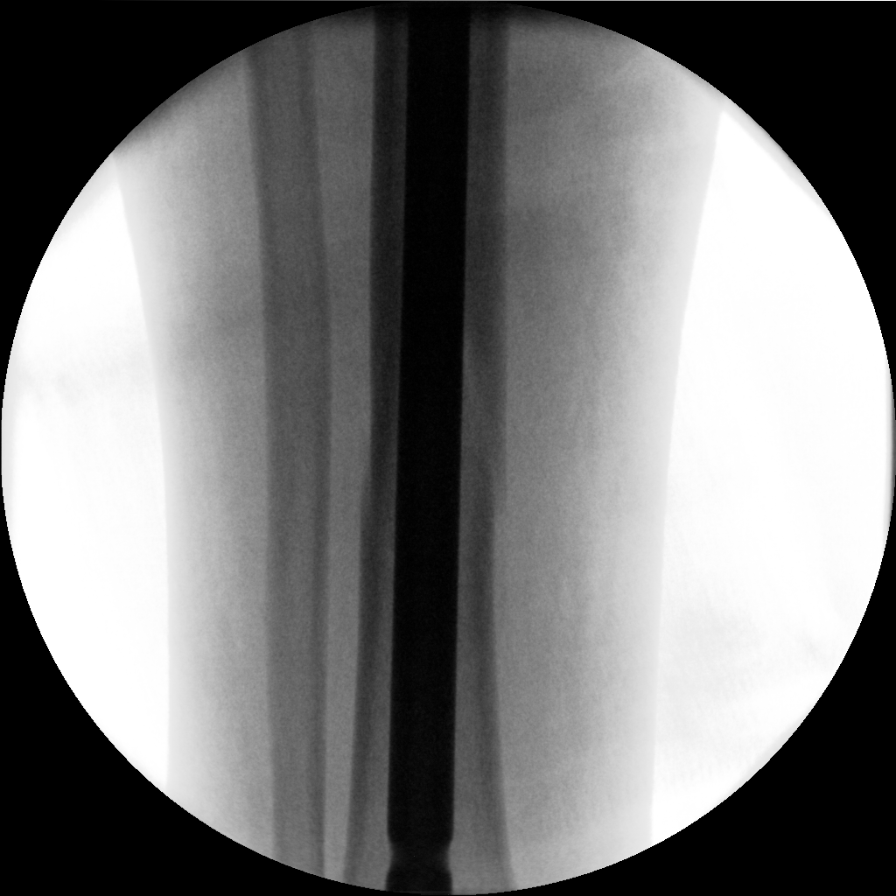

[Series 13: cont. · 1 of 1 slices shown (5 of 6)]
[im 1/1]
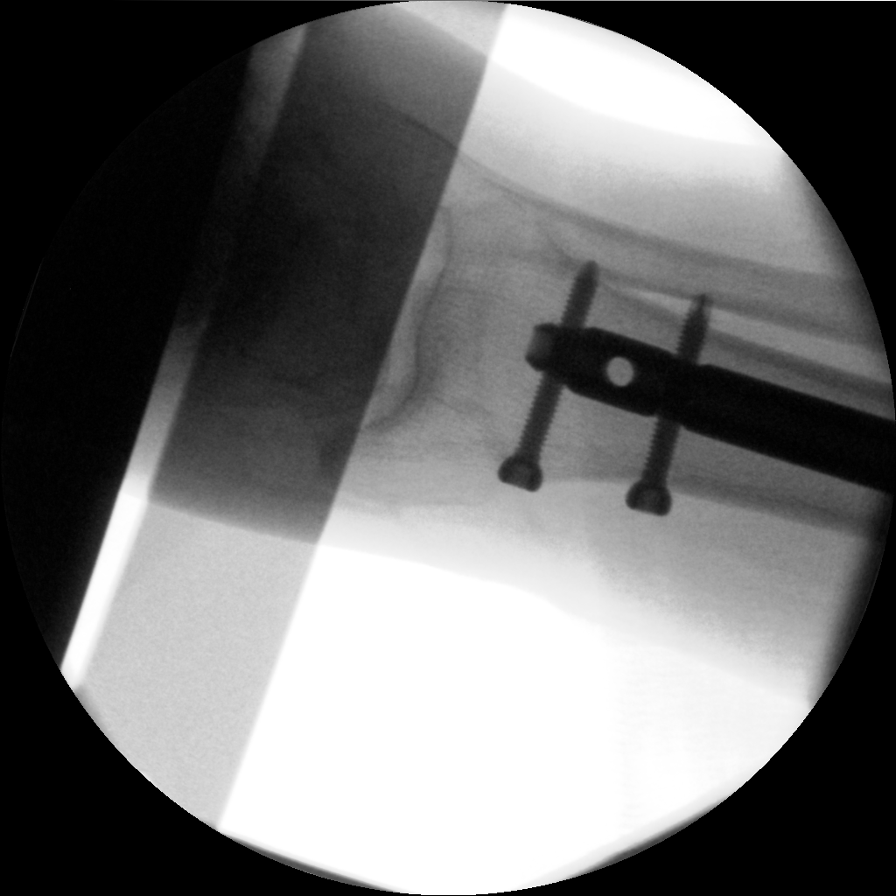

[Series 14: cont. · 1 of 1 slices shown (6 of 6)]
[im 1/1]
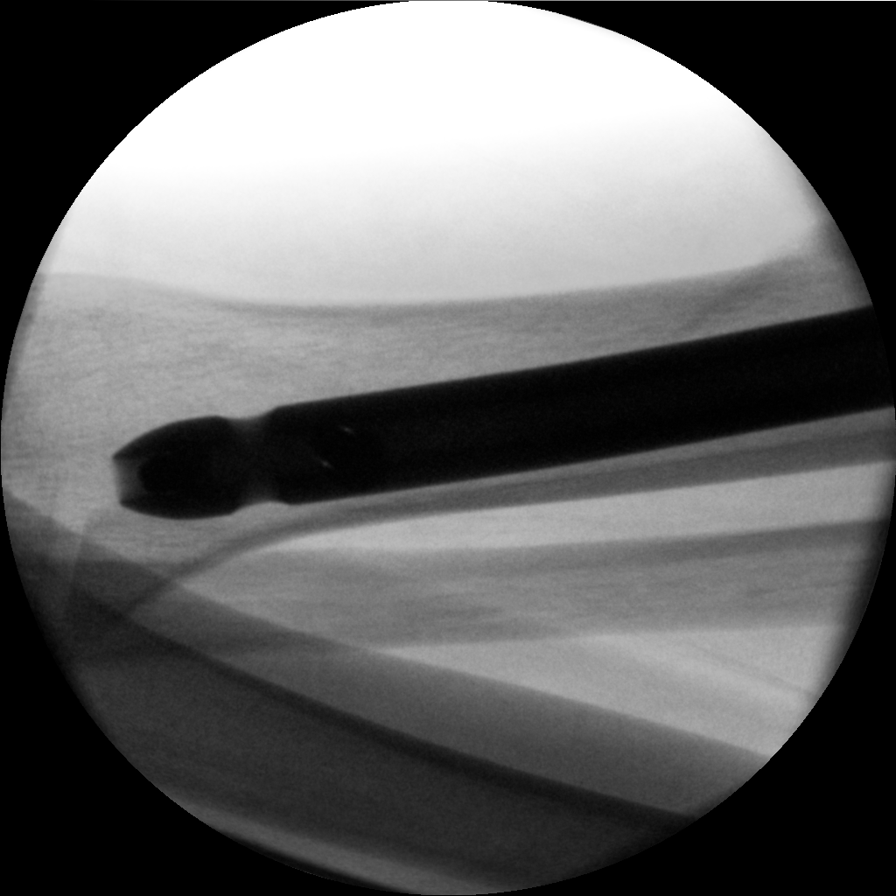

[6 of 6 positions shown; findings below may reference images not displayed]

FINDINGS: Frontal and lateral views obtained. There is screw and rod fixation
through a fracture of the junction of the mid and distal thirds of
the tibia. Alignment is essentially anatomic postoperatively. There
is a fracture of the proximal fibular diaphysis with slight lateral
displacement distally. No dislocation evident. Visualized joint
spaces appear unremarkable.
IMPRESSION: Postoperative fixation of fracture of the junction of the mid and
distal thirds of the tibia with alignment essentially anatomic.
Mildly displaced fracture proximal fibula.

## 2018-10-16 ENCOUNTER — Encounter: Payer: Self-pay | Admitting: Adult Health

## 2018-10-16 ENCOUNTER — Ambulatory Visit (INDEPENDENT_AMBULATORY_CARE_PROVIDER_SITE_OTHER): Payer: Medicare HMO | Admitting: Adult Health

## 2018-10-16 VITALS — BP 142/76 | HR 85 | Resp 16 | Ht 71.0 in | Wt 222.0 lb

## 2018-10-16 DIAGNOSIS — I10 Essential (primary) hypertension: Secondary | ICD-10-CM

## 2018-10-16 DIAGNOSIS — K219 Gastro-esophageal reflux disease without esophagitis: Secondary | ICD-10-CM

## 2018-10-16 DIAGNOSIS — Z794 Long term (current) use of insulin: Secondary | ICD-10-CM | POA: Diagnosis not present

## 2018-10-16 DIAGNOSIS — E782 Mixed hyperlipidemia: Secondary | ICD-10-CM | POA: Diagnosis not present

## 2018-10-16 DIAGNOSIS — J4520 Mild intermittent asthma, uncomplicated: Secondary | ICD-10-CM

## 2018-10-16 DIAGNOSIS — E08649 Diabetes mellitus due to underlying condition with hypoglycemia without coma: Secondary | ICD-10-CM

## 2018-10-16 NOTE — Progress Notes (Signed)
[facility] [address] [place] [id number]  Internal MEDICINE  Office Visit Note  Patient Name: Tony BucyDonald R Joseph  [id number]  [id number]  Date of Service: 10/18/2018  Chief Complaint  Patient presents with  . Hyperlipidemia  . Hypertension  . Gastroesophageal Reflux  . Asthma  . Diabetes    [facility], last A1c was 6.3  . Quality Metric Gaps    foot exam     HPI Pt is here for follow up on HLD, HTN, GERD, Asthma and DM.  [facility].  He has recently been started on Dexicom blood sugar monitor. He is very satisfied and is confident he will not have anymore low blood sugars.  Patient denies any recent issues with his blood pressure, GERD, hyperlipidemia or asthma.  He is in need of a foot exam to close his quality metric gaps at this visit.    Current Medication: Outpatient Encounter Medications as of 10/16/2018  Medication Sig  . aspirin EC 81 MG tablet Take 81 mg by mouth daily.  Marland Kitchen. atorvastatin (LIPITOR) 40 MG tablet TAKE 1 TABLET BY MOUTH EVERY DAY  . benazepril (LOTENSIN) 40 MG tablet TAKE 1 TABLET BY MOUTH EVERY DAY  . Cholecalciferol 1000 units capsule Take 1,000 Units by mouth daily.  Marland Kitchen. docusate sodium (COLACE) 100 MG capsule Take 1 capsule (100 mg total) by mouth 2 (two) times daily.  Marland Kitchen. enoxaparin (LOVENOX) 40 MG/0.4ML injection Inject 0.4 mLs (40 mg total) into the skin daily.  Marland Kitchen. HUMULIN R 100 UNIT/ML injection Inject 0.15-0.17 mLs (15-17 Units total) into the skin 2 (two) times daily before a meal. And per sliding scale Pt is not to get novolin at all (Patient taking differently: Inject 15-17 Units into the skin 2 (two) times daily before a meal. 16 units in the morning and per sliding scale Pt is not to get novolin at all)  . hydrochlorothiazide (HYDRODIURIL) 25 MG tablet Take 25 mg by mouth daily.  . Insulin Glargine (LANTUS SOLOSTAR) 100 UNIT/ML Solostar Pen Inject 65 Units into the skin  daily at 10 pm. (Patient taking differently: Inject 56 Units into the skin daily. )  . insulin glargine (LANTUS) 100 UNIT/ML injection Inject 0.56 mLs (56 Units total) into the skin daily with breakfast.  . insulin regular (HUMULIN R) 100 units/mL injection USE 17 UNITS SUBCUTANEOUSLY TWICE DAILY WITH MEALS AND USE A SLIDING SCALE IF SUGARS GET HIGH  . [place] Ginseng 1000 MG TABS Take 1,000 mg by mouth daily.  . Omega 3 1200 MG CAPS Take 1,200 mg by mouth daily.  . ONE TOUCH ULTRA TEST test strip CHECK SUGAR 5 X A DAY FOR DX 250.3 & FLUCTUATING SUGAR  . pantoprazole (PROTONIX) 40 MG tablet TAKE 1 TABLET BY MOUTH DAILY  . pyridOXINE (VITAMIN B-6) 100 MG tablet Take 100 mg by mouth daily.  Marland Kitchen. thyroid (ARMOUR) 120 MG tablet Take 120 mg by mouth daily before breakfast.  . traMADol (ULTRAM) 50 MG tablet Take 1 tablet (50 mg total) by mouth every 6 (six) hours as needed.   No facility-administered encounter medications on file as of 10/16/2018.     Surgical History: Past Surgical History:  Procedure Laterality Date  . CARPAL TUNNEL RELEASE Right 1980's  . CARPAL TUNNEL RELEASE Left 05/05/2016   Procedure: CARPAL TUNNEL RELEASE;  Surgeon: Kennedy BuckerMichael Menz, MD;  Location: [facility] ORS;  Service: Orthopedics;  Laterality: Left;  . CATARACT EXTRACTION  W/PHACO Left 05/12/2015   Procedure: CATARACT EXTRACTION PHACO AND INTRAOCULAR LENS PLACEMENT (IOC);  Surgeon: Galen Manila, MD;  Location: [facility] OR[facility];  Service: Ophthalmology;  Laterality: Left;  [place] 01:53.1AP% 21.4CDE 24.36fluid pack lot # [id number]  . EYE SURGERY Left    cataract extractions  . INCISION AND DRAINAGE OF WOUND Left 1988   "staph infection" left leg  . INTRAMEDULLARY (IM) NAIL INTERTROCHANTERIC Right 01/22/2018   Procedure: INTRAMEDULLARY (IM) NAIL INTERTROCHANTRIC;  Surgeon: Juanell Fairly, MD;  Location: [facility] ORS;  Service: Orthopedics;  Laterality: Right;  . KNEE ARTHROPLASTY Left 12/28/2015   Procedure: COMPUTER ASSISTED TOTAL KNEE  ARTHROPLASTY;  Surgeon: Donato Heinz, MD;  Location: [facility] ORS;  Service: Orthopedics;  Laterality: Left;  . ORIF WRIST FRACTURE Left 05/05/2016   Procedure: OPEN REDUCTION INTERNAL FIXATION (ORIF) WRIST FRACTURE;  Surgeon: Kennedy Bucker, MD;  Location: [facility] ORS;  Service: Orthopedics;  Laterality: Left;    Medical History: Past Medical History:  Diagnosis Date  . Agent orange exposure    "[name]"  . Arthritis    "left knee" (03/18/2014)  . Closed pelvic fracture (HCC) 03/18/2014   "multiple S/P fall w/loss of consciousness; CBG 29"  . Encephalopathy acute 08/16/2013  . GERD (gastroesophageal reflux disease)   . High cholesterol   . Hypertension   . Hypothyroidism   . Type II diabetes mellitus (HCC)     Family History: Family History  Problem Relation Age of Onset  . Colon cancer Mother   . Cerebral aneurysm Father   . Prostate cancer Neg Hx   . Bladder Cancer Neg Hx   . Kidney cancer Neg Hx     Social History   Socioeconomic History  . Marital status: Married    Spouse name: Not on file  . Number of children: Not on file  . Years of education: Not on file  . Highest education level: Not on file  Occupational History  . Not on file  Social Needs  . Financial resource strain: Not on file  . Food insecurity:    Worry: Not on file    Inability: Not on file  . Transportation needs:    Medical: Not on file    Non-medical: Not on file  Tobacco Use  . Smoking status: Former Smoker    Packs/day: 3.00    Years: 30.00    Pack years: 90.00    Types: Cigarettes  . Smokeless tobacco: Never Used  . Tobacco comment: "quit smoking in 1988"  Substance and Sexual Activity  . Alcohol use: No  . Drug use: No  . Sexual activity: Never  Lifestyle  . Physical activity:    Days per week: Not on file    Minutes per session: Not on file  . Stress: Not on file  Relationships  . Social connections:    Talks on phone: Not on file    Gets together: Not on file    Attends  religious service: Not on file    Active member of club or organization: Not on file    Attends meetings of clubs or organizations: Not on file    Relationship status: Not on file  . Intimate partner violence:    Fear of current or ex partner: Not on file    Emotionally abused: Not on file    Physically abused: Not on file    Forced sexual activity: Not on file  Other Topics Concern  . Not on file  Social History Narrative  . Not  on file      Review of Systems  Constitutional: Negative.  Negative for chills, fatigue and unexpected weight change.  HENT: Negative.  Negative for congestion, rhinorrhea, sneezing and sore throat.   Eyes: Negative for redness.  Respiratory: Negative.  Negative for cough, chest tightness and shortness of breath.   Cardiovascular: Negative.  Negative for chest pain and palpitations.  Gastrointestinal: Negative.  Negative for abdominal pain, constipation, diarrhea, nausea and vomiting.  Endocrine: Negative.   Genitourinary: Negative.  Negative for dysuria and frequency.  Musculoskeletal: Negative.  Negative for arthralgias, back pain, joint swelling and neck pain.  Skin: Negative.  Negative for rash.  Allergic/Immunologic: Negative.   Neurological: Negative.  Negative for tremors and numbness.  Hematological: Negative for adenopathy. Does not bruise/bleed easily.  Psychiatric/Behavioral: Negative.  Negative for behavioral problems, sleep disturbance and suicidal ideas. The patient is not nervous/anxious.     Vital Signs: BP (!) 142/76 (BP Location: Left Arm, Patient Position: Sitting, Cuff Size: Normal)   Pulse 85   Resp 16   Ht 5\' 11"  (1.803 m)   Wt 222 lb (100.7 kg)   SpO2 99%   BMI 30.96 kg/m    Physical Exam Vitals signs and nursing note reviewed.  Constitutional:      General: He is not in acute distress.    Appearance: He is well-developed. He is not diaphoretic.  HENT:     Head: Normocephalic and atraumatic.     Mouth/Throat:      Pharynx: No oropharyngeal exudate.  Eyes:     Pupils: Pupils are equal, round, and reactive to light.  Neck:     Musculoskeletal: Normal range of motion and neck supple.     Thyroid: No thyromegaly.     Vascular: No JVD.     Trachea: No tracheal deviation.  Cardiovascular:     Rate and Rhythm: Normal rate and regular rhythm.     Heart sounds: Normal heart sounds. No murmur. No friction rub. No gallop.   Pulmonary:     Effort: Pulmonary effort is normal. No respiratory distress.     Breath sounds: Normal breath sounds. No wheezing or rales.  Chest:     Chest wall: No tenderness.  Abdominal:     Palpations: Abdomen is soft.     Tenderness: There is no abdominal tenderness. There is no guarding.  Musculoskeletal: Normal range of motion.  Lymphadenopathy:     Cervical: No cervical adenopathy.  Skin:    General: Skin is warm and dry.  Neurological:     Mental Status: He is alert and oriented to person, place, and time.     Cranial Nerves: No cranial nerve deficit.  Psychiatric:        Behavior: Behavior normal.        Thought Content: Thought content normal.        Judgment: Judgment normal.    Assessment/Plan: 1. Diabetes mellitus due to underlying condition with hypoglycemia without coma, with long-term current use of insulin [facility]) Patient seeing endocrinology.  His diabetes is currently well controlled using his Dexcom glucometer.  He will continue to manage this and see endocrinology.  2. Essential hypertension Stable, continue current medications as prescribed.  3. Mixed hyperlipidemia Stable, will assess lipid panel at next blood draw.  4. Gastroesophageal reflux disease without esophagitis Stable, continue current medications.  5. Mild intermittent asthma, unspecified whether complicated Stable, continue current medication regimen.  General Counseling: Joaquim Nam understanding of the findings of todays visit and agrees  with plan of treatment. I have discussed  any further diagnostic evaluation that may be needed or ordered today. We also reviewed his medications today. he has been encouraged to call the office with any questions or concerns that should arise related to todays visit.    No orders of the defined types were placed in this encounter.   No orders of the defined types were placed in this encounter.   Time spent: 30 Minutes   This patient was seen by Blima Ledger AGNP-C in Collaboration with Dr Lyndon Code as a part of collaborative care agreement     Johnna Acosta AGNP-C Internal medicine

## 2018-10-18 ENCOUNTER — Encounter: Payer: Self-pay | Admitting: Adult Health

## 2018-11-01 DIAGNOSIS — E119 Type 2 diabetes mellitus without complications: Secondary | ICD-10-CM | POA: Diagnosis not present

## 2018-11-07 ENCOUNTER — Encounter: Payer: Self-pay | Admitting: Adult Health

## 2018-11-23 DIAGNOSIS — E0520 Thyrotoxicosis with toxic multinodular goiter without thyrotoxic crisis or storm: Secondary | ICD-10-CM | POA: Diagnosis not present

## 2018-11-23 DIAGNOSIS — E063 Autoimmune thyroiditis: Secondary | ICD-10-CM | POA: Diagnosis not present

## 2018-11-23 DIAGNOSIS — E890 Postprocedural hypothyroidism: Secondary | ICD-10-CM | POA: Diagnosis not present

## 2018-11-23 DIAGNOSIS — E785 Hyperlipidemia, unspecified: Secondary | ICD-10-CM | POA: Diagnosis not present

## 2018-11-23 DIAGNOSIS — I10 Essential (primary) hypertension: Secondary | ICD-10-CM | POA: Diagnosis not present

## 2018-11-23 DIAGNOSIS — E1165 Type 2 diabetes mellitus with hyperglycemia: Secondary | ICD-10-CM | POA: Diagnosis not present

## 2018-11-30 DIAGNOSIS — I10 Essential (primary) hypertension: Secondary | ICD-10-CM | POA: Diagnosis not present

## 2018-11-30 DIAGNOSIS — E0520 Thyrotoxicosis with toxic multinodular goiter without thyrotoxic crisis or storm: Secondary | ICD-10-CM | POA: Diagnosis not present

## 2018-11-30 DIAGNOSIS — E785 Hyperlipidemia, unspecified: Secondary | ICD-10-CM | POA: Diagnosis not present

## 2018-11-30 DIAGNOSIS — E890 Postprocedural hypothyroidism: Secondary | ICD-10-CM | POA: Diagnosis not present

## 2018-11-30 DIAGNOSIS — E063 Autoimmune thyroiditis: Secondary | ICD-10-CM | POA: Diagnosis not present

## 2018-11-30 DIAGNOSIS — E1165 Type 2 diabetes mellitus with hyperglycemia: Secondary | ICD-10-CM | POA: Diagnosis not present

## 2018-12-28 ENCOUNTER — Other Ambulatory Visit: Payer: Self-pay | Admitting: Internal Medicine

## 2019-01-15 DIAGNOSIS — R69 Illness, unspecified: Secondary | ICD-10-CM | POA: Diagnosis not present

## 2019-02-15 ENCOUNTER — Encounter: Payer: Self-pay | Admitting: Adult Health

## 2019-02-15 ENCOUNTER — Ambulatory Visit (INDEPENDENT_AMBULATORY_CARE_PROVIDER_SITE_OTHER): Payer: Medicare HMO | Admitting: Adult Health

## 2019-02-15 ENCOUNTER — Other Ambulatory Visit: Payer: Self-pay

## 2019-02-15 VITALS — BP 126/74 | HR 87 | Resp 16 | Ht 71.0 in | Wt 232.0 lb

## 2019-02-15 DIAGNOSIS — E1165 Type 2 diabetes mellitus with hyperglycemia: Secondary | ICD-10-CM

## 2019-02-15 DIAGNOSIS — E782 Mixed hyperlipidemia: Secondary | ICD-10-CM | POA: Diagnosis not present

## 2019-02-15 DIAGNOSIS — I10 Essential (primary) hypertension: Secondary | ICD-10-CM | POA: Diagnosis not present

## 2019-02-15 LAB — POCT GLYCOSYLATED HEMOGLOBIN (HGB A1C): Hemoglobin A1C: 6.9 % — AB (ref 4.0–5.6)

## 2019-02-15 NOTE — Progress Notes (Signed)
[facility] [address] [place], [place] [id number]  Internal MEDICINE  Office Visit Note  Patient Name: Tony BucyDonald R Joseph  [id number]/16/2042  [id number]  Date of Service: 02/15/2019  Chief Complaint  Patient presents with  . Diabetes    bs 211 right now. 4 month follow up   . Hyperlipidemia  . Hypertension  . Quality Metric Gaps    foot exam     HPI  PT is here for follow up on DM, HLD, and HTN.  Overall patient is doing well.  He reports his blood sugars have been over 200 in the morning for the last few days.  It has been high due to his diet.  He is aware, and reports he has figured out which foods are making it high.  Pt is using his Dexicom glucose monitor with excellent results.    Current Medication: Outpatient Encounter Medications as of 02/15/2019  Medication Sig  . aspirin EC 81 MG tablet Take 81 mg by mouth daily.  Marland Kitchen. atorvastatin (LIPITOR) 40 MG tablet TAKE 1 TABLET BY MOUTH EVERY DAY  . benazepril (LOTENSIN) 40 MG tablet TAKE 1 TABLET BY MOUTH EVERY DAY  . Cholecalciferol 1000 units capsule Take 1,000 Units by mouth daily.  Marland Kitchen. docusate sodium (COLACE) 100 MG capsule Take 1 capsule (100 mg total) by mouth 2 (two) times daily.  Marland Kitchen. enoxaparin (LOVENOX) 40 MG/0.4ML injection Inject 0.4 mLs (40 mg total) into the skin daily.  Marland Kitchen. HUMULIN R 100 UNIT/ML injection Inject 0.15-0.17 mLs (15-17 Units total) into the skin 2 (two) times daily before a meal. And per sliding scale Pt is not to get novolin at all (Patient taking differently: Inject 15-17 Units into the skin 2 (two) times daily before a meal. 16 units in the morning and per sliding scale Pt is not to get novolin at all)  . hydrochlorothiazide (HYDRODIURIL) 25 MG tablet TAKE 1 TABLET BY MOUTH EVERY DAY IN THE MORNING  . Insulin Glargine (LANTUS SOLOSTAR) 100 UNIT/ML Solostar Pen Inject 65 Units into the skin daily at 10 pm. (Patient taking differently: Inject 56 Units into the skin daily. )  . insulin glargine  (LANTUS) 100 UNIT/ML injection Inject 0.56 mLs (56 Units total) into the skin daily with breakfast.  . insulin regular (HUMULIN R) 100 units/mL injection USE 17 UNITS SUBCUTANEOUSLY TWICE DAILY WITH MEALS AND USE A SLIDING SCALE IF SUGARS GET HIGH  . [place] Ginseng 1000 MG TABS Take 1,000 mg by mouth daily.  . Omega 3 1200 MG CAPS Take 1,200 mg by mouth daily.  . ONE TOUCH ULTRA TEST test strip CHECK SUGAR 5 X A DAY FOR DX 250.3 & FLUCTUATING SUGAR  . pantoprazole (PROTONIX) 40 MG tablet TAKE 1 TABLET BY MOUTH DAILY  . pyridOXINE (VITAMIN B-6) 100 MG tablet Take 100 mg by mouth daily.  Marland Kitchen. thyroid (ARMOUR) 120 MG tablet Take 120 mg by mouth daily before breakfast.  . [DISCONTINUED] traMADol (ULTRAM) 50 MG tablet Take 1 tablet (50 mg total) by mouth every 6 (six) hours as needed. (Patient not taking: Reported on 02/15/2019)   No facility-administered encounter medications on file as of 02/15/2019.     Surgical History: Past Surgical History:  Procedure Laterality Date  . CARPAL TUNNEL RELEASE Right 1980's  . CARPAL TUNNEL RELEASE Left 05/05/2016   Procedure: CARPAL TUNNEL RELEASE;  Surgeon: Kennedy BuckerMichael Menz, MD;  Location: [facility] ORS;  Service: Orthopedics;  Laterality: Left;  . CATARACT EXTRACTION W/PHACO Left 05/12/2015   Procedure: CATARACT  EXTRACTION PHACO AND INTRAOCULAR LENS PLACEMENT (IOC);  Surgeon: Galen Manila, MD;  Location: [facility] ORS;  Service: Ophthalmology;  Laterality: Left;  [place] 01:53.1AP% 21.4CDE 24.61fluid pack lot # [id number]  . EYE SURGERY Left    cataract extractions  . INCISION AND DRAINAGE OF WOUND Left 1988   "staph infection" left leg  . INTRAMEDULLARY (IM) NAIL INTERTROCHANTERIC Right 01/22/2018   Procedure: INTRAMEDULLARY (IM) NAIL INTERTROCHANTRIC;  Surgeon: Juanell Fairly, MD;  Location: [facility] ORS;  Service: Orthopedics;  Laterality: Right;  . KNEE ARTHROPLASTY Left 12/28/2015   Procedure: COMPUTER ASSISTED TOTAL KNEE ARTHROPLASTY;  Surgeon: Donato Heinz, MD;  Location:  [facility] ORS;  Service: Orthopedics;  Laterality: Left;  . ORIF WRIST FRACTURE Left 05/05/2016   Procedure: OPEN REDUCTION INTERNAL FIXATION (ORIF) WRIST FRACTURE;  Surgeon: Kennedy Bucker, MD;  Location: [facility] ORS;  Service: Orthopedics;  Laterality: Left;    Medical History: Past Medical History:  Diagnosis Date  . Agent orange exposure    "[name]"  . Arthritis    "left knee" (03/18/2014)  . Closed pelvic fracture (HCC) 03/18/2014   "multiple S/P fall w/loss of consciousness; CBG 29"  . Encephalopathy acute 08/16/2013  . GERD (gastroesophageal reflux disease)   . High cholesterol   . Hypertension   . Hypothyroidism   . Type II diabetes mellitus (HCC)     Family History: Family History  Problem Relation Age of Onset  . Colon cancer Mother   . Cerebral aneurysm Father   . Prostate cancer Neg Hx   . Bladder Cancer Neg Hx   . Kidney cancer Neg Hx     Social History   Socioeconomic History  . Marital status: Married    Spouse name: Not on file  . Number of children: Not on file  . Years of education: Not on file  . Highest education level: Not on file  Occupational History  . Not on file  Social Needs  . Financial resource strain: Not on file  . Food insecurity:    Worry: Not on file    Inability: Not on file  . Transportation needs:    Medical: Not on file    Non-medical: Not on file  Tobacco Use  . Smoking status: Former Smoker    Packs/day: 3.00    Years: 30.00    Pack years: 90.00    Types: Cigarettes  . Smokeless tobacco: Never Used  . Tobacco comment: "quit smoking in 1988"  Substance and Sexual Activity  . Alcohol use: No  . Drug use: No  . Sexual activity: Never  Lifestyle  . Physical activity:    Days per week: Not on file    Minutes per session: Not on file  . Stress: Not on file  Relationships  . Social connections:    Talks on phone: Not on file    Gets together: Not on file    Attends religious service: Not on file    Active member of club or  organization: Not on file    Attends meetings of clubs or organizations: Not on file    Relationship status: Not on file  . Intimate partner violence:    Fear of current or ex partner: Not on file    Emotionally abused: Not on file    Physically abused: Not on file    Forced sexual activity: Not on file  Other Topics Concern  . Not on file  Social History Narrative  . Not on file  Review of Systems  Constitutional: Negative.  Negative for chills, fatigue and unexpected weight change.  HENT: Negative.  Negative for congestion, rhinorrhea, sneezing and sore throat.   Eyes: Negative for redness.  Respiratory: Negative.  Negative for cough, chest tightness and shortness of breath.   Cardiovascular: Negative.  Negative for chest pain and palpitations.  Gastrointestinal: Negative.  Negative for abdominal pain, constipation, diarrhea, nausea and vomiting.  Endocrine: Negative.   Genitourinary: Negative.  Negative for dysuria and frequency.  Musculoskeletal: Negative.  Negative for arthralgias, back pain, joint swelling and neck pain.  Skin: Negative.  Negative for rash.  Allergic/Immunologic: Negative.   Neurological: Negative.  Negative for tremors and numbness.  Hematological: Negative for adenopathy. Does not bruise/bleed easily.  Psychiatric/Behavioral: Negative.  Negative for behavioral problems, sleep disturbance and suicidal ideas. The patient is not nervous/anxious.     Vital Signs: BP 126/74   Pulse 87   Resp 16   Ht  (1.803 m)   Wt 232 lb (105.2 kg)   SpO2 97%   BMI 32.36 kg/m    Physical Exam Vitals signs and nursing note reviewed.  Constitutional:      General: He is not in acute distress.    Appearance: He is well-developed. He is not diaphoretic.  HENT:     Head: Normocephalic and atraumatic.     Mouth/Throat:     Pharynx: No oropharyngeal exudate.  Eyes:     Pupils: Pupils are equal, round, and reactive to light.  Neck:     Musculoskeletal:  Normal range of motion and neck supple.     Thyroid: No thyromegaly.     Vascular: No JVD.     Trachea: No tracheal deviation.  Cardiovascular:     Rate and Rhythm: Normal rate and regular rhythm.     Heart sounds: Normal heart sounds. No murmur. No friction rub. No gallop.   Pulmonary:     Effort: Pulmonary effort is normal. No respiratory distress.     Breath sounds: Normal breath sounds. No wheezing or rales.  Chest:     Chest wall: No tenderness.  Abdominal:     Palpations: Abdomen is soft.     Tenderness: There is no abdominal tenderness. There is no guarding.  Musculoskeletal: Normal range of motion.  Lymphadenopathy:     Cervical: No cervical adenopathy.  Skin:    General: Skin is warm and dry.  Neurological:     Mental Status: He is alert and oriented to person, place, and time.     Cranial Nerves: No cranial nerve deficit.  Psychiatric:        Behavior: Behavior normal.        Thought Content: Thought content normal.        Judgment: Judgment normal.    Assessment/Plan: 1. Uncontrolled type 2 diabetes mellitus with hyperglycemia (HCC) HGA1C is 6.9.  Once again discussed good food choices.  PT reports having a few instances of low blood sugars, but is much better equiped to deal with them due to his dexicom meter.   - POCT HgB A1C  2. Essential hypertension Stable, continue present management.   3. Mixed hyperlipidemia Stable, pt will continue lipitor, and recheck lipid panel at next physical.  General Counseling: Joaquim Nam understanding of the findings of todays visit and agrees with plan of treatment. I have discussed any further diagnostic evaluation that may be needed or ordered today. We also reviewed his medications today. he has been encouraged to call the office  with any questions or concerns that should arise related to todays visit.    Orders Placed This Encounter  Procedures  . POCT HgB A1C    No orders of the defined types were placed in  this encounter.   Time spent: 25 Minutes   This patient was seen by Blima Ledger AGNP-C in Collaboration with Dr Lyndon Code as a part of collaborative care agreement     Johnna Acosta AGNP-C Internal medicine

## 2019-02-15 NOTE — Patient Instructions (Signed)
Diabetes Mellitus and Nutrition, Adult  When you have diabetes (diabetes mellitus), it is very important to have healthy eating habits because your blood sugar (glucose) levels are greatly affected by what you eat and drink. Eating healthy foods in the appropriate amounts, at about the same times every day, can help you:  · Control your blood glucose.  · Lower your risk of heart disease.  · Improve your blood pressure.  · Reach or maintain a healthy weight.  Every person with diabetes is different, and each person has different needs for a meal plan. Your health care provider may recommend that you work with a diet and nutrition specialist (dietitian) to make a meal plan that is best for you. Your meal plan may vary depending on factors such as:  · The calories you need.  · The medicines you take.  · Your weight.  · Your blood glucose, blood pressure, and cholesterol levels.  · Your activity level.  · Other health conditions you have, such as heart or kidney disease.  How do carbohydrates affect me?  Carbohydrates, also called carbs, affect your blood glucose level more than any other type of food. Eating carbs naturally raises the amount of glucose in your blood. Carb counting is a method for keeping track of how many carbs you eat. Counting carbs is important to keep your blood glucose at a healthy level, especially if you use insulin or take certain oral diabetes medicines.  It is important to know how many carbs you can safely have in each meal. This is different for every person. Your dietitian can help you calculate how many carbs you should have at each meal and for each snack.  Foods that contain carbs include:  · Bread, cereal, rice, pasta, and crackers.  · Potatoes and corn.  · Peas, beans, and lentils.  · Milk and yogurt.  · Fruit and juice.  · Desserts, such as cakes, cookies, ice cream, and candy.  How does alcohol affect me?  Alcohol can cause a sudden decrease in blood glucose (hypoglycemia),  especially if you use insulin or take certain oral diabetes medicines. Hypoglycemia can be a life-threatening condition. Symptoms of hypoglycemia (sleepiness, dizziness, and confusion) are similar to symptoms of having too much alcohol.  If your health care provider says that alcohol is safe for you, follow these guidelines:  · Limit alcohol intake to no more than 1 drink per day for nonpregnant women and 2 drinks per day for men. One drink equals 12 oz of beer, 5 oz of wine, or 1½ oz of hard liquor.  · Do not drink on an empty stomach.  · Keep yourself hydrated with water, diet soda, or unsweetened iced tea.  · Keep in mind that regular soda, juice, and other mixers may contain a lot of sugar and must be counted as carbs.  What are tips for following this plan?    Reading food labels  · Start by checking the serving size on the "Nutrition Facts" label of packaged foods and drinks. The amount of calories, carbs, fats, and other nutrients listed on the label is based on one serving of the item. Many items contain more than one serving per package.  · Check the total grams (g) of carbs in one serving. You can calculate the number of servings of carbs in one serving by dividing the total carbs by 15. For example, if a food has 30 g of total carbs, it would be equal to 2   servings of carbs.  · Check the number of grams (g) of saturated and trans fats in one serving. Choose foods that have low or no amount of these fats.  · Check the number of milligrams (mg) of salt (sodium) in one serving. Most people should limit total sodium intake to less than 2,300 mg per day.  · Always check the nutrition information of foods labeled as "low-fat" or "nonfat". These foods may be higher in added sugar or refined carbs and should be avoided.  · Talk to your dietitian to identify your daily goals for nutrients listed on the label.  Shopping  · Avoid buying canned, premade, or processed foods. These foods tend to be high in fat, sodium,  and added sugar.  · Shop around the outside edge of the grocery store. This includes fresh fruits and vegetables, bulk grains, fresh meats, and fresh dairy.  Cooking  · Use low-heat cooking methods, such as baking, instead of high-heat cooking methods like deep frying.  · Cook using healthy oils, such as olive, canola, or sunflower oil.  · Avoid cooking with butter, cream, or high-fat meats.  Meal planning  · Eat meals and snacks regularly, preferably at the same times every day. Avoid going long periods of time without eating.  · Eat foods high in fiber, such as fresh fruits, vegetables, beans, and whole grains. Talk to your dietitian about how many servings of carbs you can eat at each meal.  · Eat 4-6 ounces (oz) of lean protein each day, such as lean meat, chicken, fish, eggs, or tofu. One oz of lean protein is equal to:  ? 1 oz of meat, chicken, or fish.  ? 1 egg.  ? ¼ cup of tofu.  · Eat some foods each day that contain healthy fats, such as avocado, nuts, seeds, and fish.  Lifestyle  · Check your blood glucose regularly.  · Exercise regularly as told by your health care provider. This may include:  ? 150 minutes of moderate-intensity or vigorous-intensity exercise each week. This could be brisk walking, biking, or water aerobics.  ? Stretching and doing strength exercises, such as yoga or weightlifting, at least 2 times a week.  · Take medicines as told by your health care provider.  · Do not use any products that contain nicotine or tobacco, such as cigarettes and e-cigarettes. If you need help quitting, ask your health care provider.  · Work with a counselor or diabetes educator to identify strategies to manage stress and any emotional and social challenges.  Questions to ask a health care provider  · Do I need to meet with a diabetes educator?  · Do I need to meet with a dietitian?  · What number can I call if I have questions?  · When are the best times to check my blood glucose?  Where to find more  information:  · American Diabetes Association: diabetes.org  · Academy of Nutrition and Dietetics: www.eatright.org  · National Institute of Diabetes and Digestive and Kidney Diseases (NIH): www.niddk.nih.gov  Summary  · A healthy meal plan will help you control your blood glucose and maintain a healthy lifestyle.  · Working with a diet and nutrition specialist (dietitian) can help you make a meal plan that is best for you.  · Keep in mind that carbohydrates (carbs) and alcohol have immediate effects on your blood glucose levels. It is important to count carbs and to use alcohol carefully.  This information is not intended to   replace advice given to you by your health care provider. Make sure you discuss any questions you have with your health care provider.  Document Released: 07/14/2005 Document Revised: 05/17/2017 Document Reviewed: 11/21/2016  [employer]vier Interactive Patient Education © 2019 [employer]

## 2019-03-26 ENCOUNTER — Other Ambulatory Visit: Payer: Self-pay | Admitting: Internal Medicine

## 2019-05-22 ENCOUNTER — Other Ambulatory Visit: Payer: Self-pay

## 2019-05-22 ENCOUNTER — Encounter: Payer: Self-pay | Admitting: Adult Health

## 2019-05-22 ENCOUNTER — Ambulatory Visit (INDEPENDENT_AMBULATORY_CARE_PROVIDER_SITE_OTHER): Payer: Medicare HMO | Admitting: Adult Health

## 2019-05-22 VITALS — BP 140/68 | HR 92 | Resp 16 | Ht 71.0 in | Wt 226.0 lb

## 2019-05-22 DIAGNOSIS — E1165 Type 2 diabetes mellitus with hyperglycemia: Secondary | ICD-10-CM

## 2019-05-22 DIAGNOSIS — J4520 Mild intermittent asthma, uncomplicated: Secondary | ICD-10-CM

## 2019-05-22 DIAGNOSIS — K219 Gastro-esophageal reflux disease without esophagitis: Secondary | ICD-10-CM | POA: Diagnosis not present

## 2019-05-22 DIAGNOSIS — R300 Dysuria: Secondary | ICD-10-CM | POA: Diagnosis not present

## 2019-05-22 DIAGNOSIS — E782 Mixed hyperlipidemia: Secondary | ICD-10-CM | POA: Diagnosis not present

## 2019-05-22 DIAGNOSIS — Z0001 Encounter for general adult medical examination with abnormal findings: Secondary | ICD-10-CM

## 2019-05-22 DIAGNOSIS — E039 Hypothyroidism, unspecified: Secondary | ICD-10-CM | POA: Diagnosis not present

## 2019-05-22 DIAGNOSIS — I10 Essential (primary) hypertension: Secondary | ICD-10-CM | POA: Diagnosis not present

## 2019-05-22 DIAGNOSIS — Z125 Encounter for screening for malignant neoplasm of prostate: Secondary | ICD-10-CM | POA: Diagnosis not present

## 2019-05-22 DIAGNOSIS — E0781 Sick-euthyroid syndrome: Secondary | ICD-10-CM | POA: Diagnosis not present

## 2019-05-22 DIAGNOSIS — R946 Abnormal results of thyroid function studies: Secondary | ICD-10-CM | POA: Diagnosis not present

## 2019-05-22 DIAGNOSIS — E756 Lipid storage disorder, unspecified: Secondary | ICD-10-CM | POA: Diagnosis not present

## 2019-05-22 LAB — POCT GLYCOSYLATED HEMOGLOBIN (HGB A1C): Hemoglobin A1C: 6.6 % — AB (ref 4.0–5.6)

## 2019-05-22 NOTE — Progress Notes (Signed)
[facility] [address] [place], [place] [address]  Internal MEDICINE  Office Visit Note  Patient Name: [name]  [id number]  [id number]  Date of Service: 05/22/2019  Chief Complaint  Patient presents with  . Medical Management of Chronic Issues  . Annual Exam  . Diabetes  . Hypertension  . Hyperlipidemia  . Quality Metric Gaps    foot exam      HPI Pt is here for routine health maintenance examination.  Pt is well appearing 77yo white male. He has a history of DM, HTN, HLD, gerd, asthma and hypothyroid.  Overall pt is doing well.  He denies any overt complaints at this. He is very happy with his DM management and he religiously follows his numbers that are sent to his meter from his stick on patch on his abdomen.  He denies any extreme highs or lows.  He Denies Chest pain, Shortness of breath, palpitations, headache, or blurred vision.       Current Medication: Outpatient Encounter Medications as of 05/22/2019  Medication Sig  . aspirin EC 81 MG tablet Take 81 mg by mouth daily.  Marland Kitchen. atorvastatin (LIPITOR) 40 MG tablet TAKE 1 TABLET BY MOUTH EVERY DAY  . benazepril (LOTENSIN) 40 MG tablet TAKE 1 TABLET BY MOUTH EVERY DAY  . Cholecalciferol 1000 units capsule Take 1,000 Units by mouth daily.  Marland Kitchen. docusate sodium (COLACE) 100 MG capsule Take 1 capsule (100 mg total) by mouth 2 (two) times daily.  Marland Kitchen. enoxaparin (LOVENOX) 40 MG/0.4ML injection Inject 0.4 mLs (40 mg total) into the skin daily.  Marland Kitchen. HUMULIN R 100 UNIT/ML injection Inject 0.15-0.17 mLs (15-17 Units total) into the skin 2 (two) times daily before a meal. And per sliding scale Pt is not to get novolin at all (Patient taking differently: Inject 15-17 Units into the skin 2 (two) times daily before a meal. 16 units in the morning and per sliding scale Pt is not to get novolin at all)  . hydrochlorothiazide (HYDRODIURIL) 25 MG tablet TAKE 1 TABLET BY MOUTH EVERY DAY IN THE MORNING  . Insulin Glargine (LANTUS  SOLOSTAR) 100 UNIT/ML Solostar Pen Inject 65 Units into the skin daily at 10 pm. (Patient taking differently: Inject 56 Units into the skin daily. )  . insulin glargine (LANTUS) 100 UNIT/ML injection Inject 0.56 mLs (56 Units total) into the skin daily with breakfast.  . insulin regular (HUMULIN R) 100 units/mL injection USE 17 UNITS SUBCUTANEOUSLY TWICE DAILY WITH MEALS AND USE A SLIDING SCALE IF SUGARS GET HIGH  . [place] Ginseng 1000 MG TABS Take 1,000 mg by mouth daily.  . Omega 3 1200 MG CAPS Take 1,200 mg by mouth daily.  . ONE TOUCH ULTRA TEST test strip CHECK SUGAR 5 X A DAY FOR DX 250.3 & FLUCTUATING SUGAR  . pantoprazole (PROTONIX) 40 MG tablet TAKE 1 TABLET BY MOUTH DAILY  . pyridOXINE (VITAMIN B-6) 100 MG tablet Take 100 mg by mouth daily.  Marland Kitchen. thyroid (ARMOUR) 120 MG tablet Take 120 mg by mouth daily before breakfast.   No facility-administered encounter medications on file as of 05/22/2019.     Surgical History: Past Surgical History:  Procedure Laterality Date  . CARPAL TUNNEL RELEASE Right 1980's  . CARPAL TUNNEL RELEASE Left 05/05/2016   Procedure: CARPAL TUNNEL RELEASE;  Surgeon: Kennedy BuckerMichael Menz, MD;  Location: [facility] ORS;  Service: Orthopedics;  Laterality: Left;  . CATARACT EXTRACTION W/PHACO Left 05/12/2015   Procedure: CATARACT EXTRACTION PHACO AND INTRAOCULAR LENS PLACEMENT (  [place]);  Surgeon: Birder Robson, MD;  Location: [facility];  Service: Ophthalmology;  Laterality: Left;  [place] 01:53.1AP% 21.4CDE 24.37fluid pack lot # [id number]  . EYE SURGERY Left    cataract extractions  . INCISION AND DRAINAGE OF WOUND Left 1988   "staph infection" left leg  . INTRAMEDULLARY (IM) NAIL INTERTROCHANTERIC Right 01/22/2018   Procedure: INTRAMEDULLARY (IM) NAIL INTERTROCHANTRIC;  Surgeon: Thornton Park, MD;  Location: [facility];  Service: Orthopedics;  Laterality: Right;  . KNEE ARTHROPLASTY Left 12/28/2015   Procedure: COMPUTER ASSISTED TOTAL KNEE ARTHROPLASTY;  Surgeon: Dereck Leep, MD;   Location: [facility] OR[facility];  Service: Orthopedics;  Laterality: Left;  . ORIF WRIST FRACTURE Left 05/05/2016   Procedure: OPEN REDUCTION INTERNAL FIXATION (ORIF) WRIST FRACTURE;  Surgeon: Hessie Knows, MD;  Location: [facility];  Service: Orthopedics;  Laterality: Left;    Medical History: Past Medical History:  Diagnosis Date  . Agent orange exposure    "[place]"  . Arthritis    "left knee" (03/18/2014)  . Closed pelvic fracture ([place]) 03/18/2014   "multiple S/P fall w/loss of consciousness; CBG 29"  . Encephalopathy acute 08/16/2013  . GERD (gastroesophageal reflux disease)   . High cholesterol   . Hypertension   . Hypothyroidism   . Type II diabetes mellitus (HCC)     Family History: Family History  Problem Relation Age of Onset  . Colon cancer Mother   . Cerebral aneurysm Father   . Prostate cancer Neg Hx   . Bladder Cancer Neg Hx   . Kidney cancer Neg Hx       Review of Systems  Constitutional: Negative.  Negative for chills, fatigue and unexpected weight change.  HENT: Negative.  Negative for congestion, rhinorrhea, sneezing and sore throat.   Eyes: Negative for redness.  Respiratory: Negative.  Negative for cough, chest tightness and shortness of breath.   Cardiovascular: Negative.  Negative for chest pain and palpitations.  Gastrointestinal: Negative.  Negative for abdominal pain, constipation, diarrhea, nausea and vomiting.  Endocrine: Negative.   Genitourinary: Negative.  Negative for dysuria and frequency.  Musculoskeletal: Negative.  Negative for arthralgias, back pain, joint swelling and neck pain.  Skin: Negative.  Negative for rash.  Allergic/Immunologic: Negative.   Neurological: Negative.  Negative for tremors and numbness.  Hematological: Negative for adenopathy. Does not bruise/bleed easily.  Psychiatric/Behavioral: Negative.  Negative for behavioral problems, sleep disturbance and suicidal ideas. The patient is not nervous/anxious.      Vital Signs: BP  140/68   Pulse 92   Resp 16   Ht 5\' 11"  (1.803 m)   Wt 226 lb (102.5 kg)   SpO2 95%   BMI 31.52 kg/m    Physical Exam Vitals signs and nursing note reviewed.  Constitutional:      General: He is not in acute distress.    Appearance: He is well-developed. He is not diaphoretic.  HENT:     Head: Normocephalic and atraumatic.     Mouth/Throat:     Pharynx: No oropharyngeal exudate.  Eyes:     Pupils: Pupils are equal, round, and reactive to light.  Neck:     Musculoskeletal: Normal range of motion and neck supple.     Thyroid: No thyromegaly.     Vascular: No JVD.     Trachea: No tracheal deviation.  Cardiovascular:     Rate and Rhythm: Normal rate and regular rhythm.     Heart sounds: Normal heart sounds. No murmur. No friction rub. No gallop.  Pulmonary:     Effort: Pulmonary effort is normal. No respiratory distress.     Breath sounds: Normal breath sounds. No wheezing or rales.  Chest:     Chest wall: No tenderness.  Abdominal:     Palpations: Abdomen is soft.     Tenderness: There is no abdominal tenderness. There is no guarding.  Musculoskeletal: Normal range of motion.  Lymphadenopathy:     Cervical: No cervical adenopathy.  Skin:    General: Skin is warm and dry.  Neurological:     Mental Status: He is alert and oriented to person, place, and time.     Cranial Nerves: No cranial nerve deficit.  Psychiatric:        Behavior: Behavior normal.        Thought Content: Thought content normal.        Judgment: Judgment normal.      LABS: No results found for this or any previous visit (from the past 2160 hour(s)).   Assessment/Plan: 1. Encounter for general adult medical examination with abnormal findings Up to date on pHM.  - CBC with Differential/Platelet - Lipid Panel With LDL/HDL Ratio - TSH - T4, free - Comprehensive metabolic panel - PSA  2. Uncontrolled type 2 diabetes mellitus with hyperglycemia (HCC) A1c is 6.6 today.  PT is doing very well  with his DM management. Continue present management.  - POCT HgB A1C  3. Essential hypertension Stable, continue present management.   4. Mixed hyperlipidemia Will check lipid panel and follow up when results are available..   5. Gastroesophageal reflux disease without esophagitis Continue taking medications as directed.   6. Mild intermittent asthma, unspecified whether complicated Continue using inhalers as directed.   7. Hypothyroidism, unspecified type Continue taking meds as directed.  Will follow labs.  8. Screening for prostate cancer - PSA  9. Dysuria - UA/M w/rflx Culture, Routine - Microscopic Examination  General Counseling: [name] verbalizes understanding of the findings of todays visit and agrees with plan of treatment. I have discussed any further diagnostic evaluation that may be needed or ordered today. We also reviewed his medications today. he has been encouraged to call the office with any questions or concerns that should arise related to todays visit.   Orders Placed This Encounter  Procedures  . UA/M w/rflx Culture, Routine  . POCT HgB A1C    No orders of the defined types were placed in this encounter.   Time spent: 30 Minutes   This patient was seen by Blima LedgerAdam Tekesha Almgren AGNP-C in Collaboration with Dr Lyndon CodeFozia M Khan as a part of collaborative care agreement    Johnna AcostaAdam J. Abygail Galeno AGNP-C Internal Medicine

## 2019-05-23 LAB — TSH: TSH: 1.1 u[IU]/mL (ref 0.450–4.500)

## 2019-05-23 LAB — UA/M W/RFLX CULTURE, ROUTINE
Bilirubin, UA: NEGATIVE
Glucose, UA: NEGATIVE
Ketones, UA: NEGATIVE
Leukocytes,UA: NEGATIVE
Nitrite, UA: NEGATIVE
Protein,UA: NEGATIVE
RBC, UA: NEGATIVE
Specific Gravity, UA: 1.023 (ref 1.005–1.030)
Urobilinogen, Ur: 0.2 mg/dL (ref 0.2–1.0)
pH, UA: 5 (ref 5.0–7.5)

## 2019-05-23 LAB — COMPREHENSIVE METABOLIC PANEL
ALT: 24 IU/L (ref 0–44)
AST: 20 IU/L (ref 0–40)
Albumin/Globulin Ratio: 2.4 — ABNORMAL HIGH (ref 1.2–2.2)
Albumin: 4.5 g/dL (ref 3.7–4.7)
Alkaline Phosphatase: 79 IU/L (ref 39–117)
BUN/Creatinine Ratio: 14 (ref 10–24)
BUN: 16 mg/dL (ref 8–27)
Bilirubin Total: 0.3 mg/dL (ref 0.0–1.2)
CO2: 25 mmol/L (ref 20–29)
Calcium: 9.3 mg/dL (ref 8.6–10.2)
Chloride: 106 mmol/L (ref 96–106)
Creatinine, Ser: 1.12 mg/dL (ref 0.76–1.27)
GFR calc Af Amer: 72 mL/min/{1.73_m2} (ref >59)
GFR calc non Af Amer: 62 mL/min/{1.73_m2} (ref >59)
Globulin, Total: 1.9 g/dL (ref 1.5–4.5)
Glucose: 137 mg/dL — ABNORMAL HIGH (ref 65–99)
Potassium: 4.6 mmol/L (ref 3.5–5.2)
Sodium: 146 mmol/L — ABNORMAL HIGH (ref 134–144)
Total Protein: 6.4 g/dL (ref 6.0–8.5)

## 2019-05-23 LAB — CBC WITH DIFFERENTIAL/PLATELET
Basophils Absolute: 0.1 10*3/uL (ref 0.0–0.2)
Basos: 1 %
EOS (ABSOLUTE): 0.1 10*3/uL (ref 0.0–0.4)
Eos: 1 %
Hematocrit: 39.2 % (ref 37.5–51.0)
Hemoglobin: 13.6 g/dL (ref 13.0–17.7)
Immature Grans (Abs): 0.1 10*3/uL (ref 0.0–0.1)
Immature Granulocytes: 2 %
Lymphocytes Absolute: 3.9 10*3/uL — ABNORMAL HIGH (ref 0.7–3.1)
Lymphs: 46 %
MCH: 32.2 pg (ref 26.6–33.0)
MCHC: 34.7 g/dL (ref 31.5–35.7)
MCV: 93 fL (ref 79–97)
Monocytes Absolute: 0.8 10*3/uL (ref 0.1–0.9)
Monocytes: 10 %
Neutrophils Absolute: 3.3 10*3/uL (ref 1.4–7.0)
Neutrophils: 40 %
Platelets: 119 10*3/uL — ABNORMAL LOW (ref 150–450)
RBC: 4.22 x10E6/uL (ref 4.14–5.80)
RDW: 13.6 % (ref 11.6–15.4)
WBC: 8.3 10*3/uL (ref 3.4–10.8)

## 2019-05-23 LAB — T4, FREE: Free T4: 1.28 ng/dL (ref 0.82–1.77)

## 2019-05-23 LAB — MICROSCOPIC EXAMINATION
Bacteria, UA: NONE SEEN
Casts: NONE SEEN /lpf

## 2019-05-23 LAB — LIPID PANEL WITH LDL/HDL RATIO
Cholesterol, Total: 102 mg/dL (ref 100–199)
HDL: 43 mg/dL (ref >39)
LDL Calculated: 47 mg/dL (ref 0–99)
LDl/HDL Ratio: 1.1 ratio (ref 0.0–3.6)
Triglycerides: 61 mg/dL (ref 0–149)
VLDL Cholesterol Cal: 12 mg/dL (ref 5–40)

## 2019-05-23 LAB — PSA: Prostate Specific Ag, Serum: 2.3 ng/mL (ref 0.0–4.0)

## 2019-05-30 DIAGNOSIS — E0520 Thyrotoxicosis with toxic multinodular goiter without thyrotoxic crisis or storm: Secondary | ICD-10-CM | POA: Diagnosis not present

## 2019-05-30 DIAGNOSIS — E6609 Other obesity due to excess calories: Secondary | ICD-10-CM | POA: Diagnosis not present

## 2019-05-30 DIAGNOSIS — E1165 Type 2 diabetes mellitus with hyperglycemia: Secondary | ICD-10-CM | POA: Diagnosis not present

## 2019-05-30 DIAGNOSIS — E890 Postprocedural hypothyroidism: Secondary | ICD-10-CM | POA: Diagnosis not present

## 2019-05-30 DIAGNOSIS — I10 Essential (primary) hypertension: Secondary | ICD-10-CM | POA: Diagnosis not present

## 2019-05-30 DIAGNOSIS — E049 Nontoxic goiter, unspecified: Secondary | ICD-10-CM | POA: Diagnosis not present

## 2019-05-30 DIAGNOSIS — E063 Autoimmune thyroiditis: Secondary | ICD-10-CM | POA: Diagnosis not present

## 2019-05-30 DIAGNOSIS — E785 Hyperlipidemia, unspecified: Secondary | ICD-10-CM | POA: Diagnosis not present

## 2019-05-31 DIAGNOSIS — H2511 Age-related nuclear cataract, right eye: Secondary | ICD-10-CM | POA: Diagnosis not present

## 2019-06-04 ENCOUNTER — Telehealth: Payer: Self-pay

## 2019-06-04 NOTE — Telephone Encounter (Signed)
COMPLETED RECORD REQUEST.CALLED PATIENT TO INFORM THAT RECORDS ARE READY FOR PICKUP.

## 2019-06-06 DIAGNOSIS — E6609 Other obesity due to excess calories: Secondary | ICD-10-CM | POA: Diagnosis not present

## 2019-06-06 DIAGNOSIS — I10 Essential (primary) hypertension: Secondary | ICD-10-CM | POA: Diagnosis not present

## 2019-06-06 DIAGNOSIS — E0520 Thyrotoxicosis with toxic multinodular goiter without thyrotoxic crisis or storm: Secondary | ICD-10-CM | POA: Diagnosis not present

## 2019-06-06 DIAGNOSIS — E1165 Type 2 diabetes mellitus with hyperglycemia: Secondary | ICD-10-CM | POA: Diagnosis not present

## 2019-06-06 DIAGNOSIS — E063 Autoimmune thyroiditis: Secondary | ICD-10-CM | POA: Diagnosis not present

## 2019-06-06 DIAGNOSIS — Z6831 Body mass index (BMI) 31.0-31.9, adult: Secondary | ICD-10-CM | POA: Diagnosis not present

## 2019-06-06 DIAGNOSIS — E785 Hyperlipidemia, unspecified: Secondary | ICD-10-CM | POA: Diagnosis not present

## 2019-06-06 DIAGNOSIS — E049 Nontoxic goiter, unspecified: Secondary | ICD-10-CM | POA: Diagnosis not present

## 2019-06-06 DIAGNOSIS — E890 Postprocedural hypothyroidism: Secondary | ICD-10-CM | POA: Diagnosis not present

## 2019-06-11 ENCOUNTER — Encounter: Payer: Self-pay | Admitting: Adult Health

## 2019-06-11 ENCOUNTER — Ambulatory Visit (INDEPENDENT_AMBULATORY_CARE_PROVIDER_SITE_OTHER): Payer: Medicare HMO | Admitting: Adult Health

## 2019-06-11 ENCOUNTER — Other Ambulatory Visit: Payer: Self-pay

## 2019-06-11 VITALS — BP 136/70 | HR 88 | Resp 16 | Ht 71.0 in | Wt 228.0 lb

## 2019-06-11 DIAGNOSIS — E08649 Diabetes mellitus due to underlying condition with hypoglycemia without coma: Secondary | ICD-10-CM

## 2019-06-11 DIAGNOSIS — E782 Mixed hyperlipidemia: Secondary | ICD-10-CM | POA: Diagnosis not present

## 2019-06-11 DIAGNOSIS — Z794 Long term (current) use of insulin: Secondary | ICD-10-CM

## 2019-06-11 DIAGNOSIS — I10 Essential (primary) hypertension: Secondary | ICD-10-CM | POA: Diagnosis not present

## 2019-06-11 DIAGNOSIS — K219 Gastro-esophageal reflux disease without esophagitis: Secondary | ICD-10-CM

## 2019-06-11 NOTE — Progress Notes (Signed)
[facility] [place], [place] [address]  Internal MEDICINE  Office Visit Note  Patient Name: [name]  [id number]  [id number]  Date of Service: 06/11/2019  Chief Complaint  Patient presents with  . Medical Management of Chronic Issues    3 week follow up labs ,    HPI  PT is here for follow up on labs.  Overall he is doing well. He needs some paperwork completed for the [place].  He denies any new or worsening symptoms at this time.    Current Medication: Outpatient Encounter Medications as of 06/11/2019  Medication Sig  . aspirin EC 81 MG tablet Take 81 mg by mouth daily.  Marland Kitchen atorvastatin (LIPITOR) 40 MG tablet TAKE 1 TABLET BY MOUTH EVERY DAY  . benazepril (LOTENSIN) 40 MG tablet TAKE 1 TABLET BY MOUTH EVERY DAY  . Cholecalciferol 1000 units capsule Take 1,000 Units by mouth daily.  Marland Kitchen docusate sodium (COLACE) 100 MG capsule Take 1 capsule (100 mg total) by mouth 2 (two) times daily.  Marland Kitchen enoxaparin (LOVENOX) 40 MG/0.4ML injection Inject 0.4 mLs (40 mg total) into the skin daily.  Marland Kitchen HUMULIN R 100 UNIT/ML injection Inject 0.15-0.17 mLs (15-17 Units total) into the skin 2 (two) times daily before a meal. And per sliding scale Pt is not to get novolin at all (Patient taking differently: Inject 15-17 Units into the skin 2 (two) times daily before a meal. 16 units in the morning and per sliding scale Pt is not to get novolin at all)  . hydrochlorothiazide (HYDRODIURIL) 25 MG tablet TAKE 1 TABLET BY MOUTH EVERY DAY IN THE MORNING  . Insulin Glargine (LANTUS SOLOSTAR) 100 UNIT/ML Solostar Pen Inject 65 Units into the skin daily at 10 pm. (Patient taking differently: Inject 56 Units into the skin daily. )  . insulin glargine (LANTUS) 100 UNIT/ML injection Inject 0.56 mLs (56 Units total) into the skin daily with breakfast.  . insulin regular (HUMULIN R) 100 units/mL injection USE 17 UNITS SUBCUTANEOUSLY TWICE DAILY WITH MEALS AND USE A SLIDING SCALE IF SUGARS GET  HIGH  . [place] Ginseng 1000 MG TABS Take 1,000 mg by mouth daily.  . Omega 3 1200 MG CAPS Take 1,200 mg by mouth daily.  . ONE TOUCH ULTRA TEST test strip CHECK SUGAR 5 X A DAY FOR DX 250.3 & FLUCTUATING SUGAR  . pantoprazole (PROTONIX) 40 MG tablet TAKE 1 TABLET BY MOUTH DAILY  . pyridOXINE (VITAMIN B-6) 100 MG tablet Take 100 mg by mouth daily.  Marland Kitchen thyroid (ARMOUR) 120 MG tablet Take 120 mg by mouth daily before breakfast.   No facility-administered encounter medications on file as of 06/11/2019.     Surgical History: Past Surgical History:  Procedure Laterality Date  . CARPAL TUNNEL RELEASE Right 1980's  . CARPAL TUNNEL RELEASE Left 05/05/2016   Procedure: CARPAL TUNNEL RELEASE;  Surgeon: Hessie Knows, MD;  Location: [facility];  Service: Orthopedics;  Laterality: Left;  . CATARACT EXTRACTION W/PHACO Left 05/12/2015   Procedure: CATARACT EXTRACTION PHACO AND INTRAOCULAR LENS PLACEMENT ([facility]);  Surgeon: Birder Robson, MD;  Location: [facility];  Service: Ophthalmology;  Laterality: Left;  [place] 01:53.1AP% 21.4CDE 24.7fluid pack lot # [id number]  . EYE SURGERY Left    cataract extractions  . INCISION AND DRAINAGE OF WOUND Left 1988   "staph infection" left leg  . INTRAMEDULLARY (IM) NAIL INTERTROCHANTERIC Right 01/22/2018   Procedure: INTRAMEDULLARY (IM) NAIL INTERTROCHANTRIC;  Surgeon: Thornton Park, MD;  Location: [facility];  Service:  Orthopedics;  Laterality: Right;  . KNEE ARTHROPLASTY Left 12/28/2015   Procedure: COMPUTER ASSISTED TOTAL KNEE ARTHROPLASTY;  Surgeon: Donato HeinzJames P Hooten, MD;  Location: [facility];  Service: Orthopedics;  Laterality: Left;  . ORIF WRIST FRACTURE Left 05/05/2016   Procedure: OPEN REDUCTION INTERNAL FIXATION (ORIF) WRIST FRACTURE;  Surgeon: Kennedy BuckerMichael Menz, MD;  Location: [facility] OR[facility];  Service: Orthopedics;  Laterality: Left;    Medical History: Past Medical History:  Diagnosis Date  . [place] exposure    [place]"  . Arthritis    "left knee" (03/18/2014)  . Closed  pelvic fracture (HCC) 03/18/2014   "multiple S/P fall w/loss of consciousness; CBG 29"  . Encephalopathy acute 08/16/2013  . GERD (gastroesophageal reflux disease)   . High cholesterol   . Hypertension   . Hypothyroidism   . Type II diabetes mellitus (HCC)     Family History: Family History  Problem Relation Age of Onset  . Colon cancer Mother   . Cerebral aneurysm Father   . Prostate cancer Neg Hx   . Bladder Cancer Neg Hx   . Kidney cancer Neg Hx     Social History   Socioeconomic History  . Marital status: Married    Spouse name: Not on file  . Number of children: Not on file  . Years of education: Not on file  . Highest education level: Not on file  Occupational History  . Not on file  Social Needs  . Financial resource strain: Not on file  . Food insecurity    Worry: Not on file    Inability: Not on file  . Transportation needs    Medical: Not on file    Non-medical: Not on file  Tobacco Use  . Smoking status: Former Smoker    Packs/day: 3.00    Years: 30.00    Pack years: 90.00    Types: Cigarettes  . Smokeless tobacco: Never Used  . Tobacco comment: "quit smoking in 1988"  Substance and Sexual Activity  . Alcohol use: No  . Drug use: No  . Sexual activity: Never  Lifestyle  . Physical activity    Days per week: Not on file    Minutes per session: Not on file  . Stress: Not on file  Relationships  . Social Musicianconnections    Talks on phone: Not on file    Gets together: Not on file    Attends religious service: Not on file    Active member of club or organization: Not on file    Attends meetings of clubs or organizations: Not on file    Relationship status: Not on file  . Intimate partner violence    Fear of current or ex partner: Not on file    Emotionally abused: Not on file    Physically abused: Not on file    Forced sexual activity: Not on file  Other Topics Concern  . Not on file  Social History Narrative  . Not on file      Review of  Systems  Constitutional: Negative.  Negative for chills, fatigue and unexpected weight change.  HENT: Negative.  Negative for congestion, rhinorrhea, sneezing and sore throat.   Eyes: Negative for redness.  Respiratory: Negative.  Negative for cough, chest tightness and shortness of breath.   Cardiovascular: Negative.  Negative for chest pain and palpitations.  Gastrointestinal: Negative.  Negative for abdominal pain, constipation, diarrhea, nausea and vomiting.  Endocrine: Negative.   Genitourinary: Negative.  Negative for dysuria and  frequency.  Musculoskeletal: Negative.  Negative for arthralgias, back pain, joint swelling and neck pain.  Skin: Negative.  Negative for rash.  Allergic/Immunologic: Negative.   Neurological: Negative.  Negative for tremors and numbness.  Hematological: Negative for adenopathy. Does not bruise/bleed easily.  Psychiatric/Behavioral: Negative.  Negative for behavioral problems, sleep disturbance and suicidal ideas. The patient is not nervous/anxious.     Vital Signs: BP 136/70   Pulse 88   Resp 16   Ht 5\' 11"  (1.803 m)   Wt 228 lb (103.4 kg)   SpO2 97%   BMI 31.80 kg/m    Physical Exam Vitals signs and nursing note reviewed.  Constitutional:      General: He is not in acute distress.    Appearance: He is well-developed. He is not diaphoretic.  HENT:     Head: Normocephalic and atraumatic.     Mouth/Throat:     Pharynx: No oropharyngeal exudate.  Eyes:     Pupils: Pupils are equal, round, and reactive to light.  Neck:     Musculoskeletal: Normal range of motion and neck supple.     Thyroid: No thyromegaly.     Vascular: No JVD.     Trachea: No tracheal deviation.  Cardiovascular:     Rate and Rhythm: Normal rate and regular rhythm.     Heart sounds: Normal heart sounds. No murmur. No friction rub. No gallop.   Pulmonary:     Effort: Pulmonary effort is normal. No respiratory distress.     Breath sounds: Normal breath sounds. No wheezing  or rales.  Chest:     Chest wall: No tenderness.  Abdominal:     Palpations: Abdomen is soft.     Tenderness: There is no abdominal tenderness. There is no guarding.  Musculoskeletal: Normal range of motion.  Lymphadenopathy:     Cervical: No cervical adenopathy.  Skin:    General: Skin is warm and dry.  Neurological:     Mental Status: He is alert and oriented to person, place, and time.     Cranial Nerves: No cranial nerve deficit.  Psychiatric:        Behavior: Behavior normal.        Thought Content: Thought content normal.        Judgment: Judgment normal.     Assessment/Plan: 1. Diabetes mellitus due to underlying condition with hypoglycemia without coma, with long-term current use of insulin (HCC) Stable, continue present management.   2. Essential hypertension Stable, continue present therapy.    3. Gastroesophageal reflux disease without esophagitis Stable, continue to use PPI as instructed.   4. Mixed hyperlipidemia Most recent lipid panel WNL.   General Counseling: Joaquim Namonald verbalizes understanding of the findings of todays visit and agrees with plan of treatment. I have discussed any further diagnostic evaluation that may be needed or ordered today. We also reviewed his medications today. he has been encouraged to call the office with any questions or concerns that should arise related to todays visit.    No orders of the defined types were placed in this encounter.   No orders of the defined types were placed in this encounter.   Time spent: 20 Minutes   This patient was seen by Blima LedgerAdam Wendall Isabell AGNP-C in Collaboration with Dr Lyndon CodeFozia M Khan as a part of collaborative care agreement     Johnna AcostaAdam J. Itzamar Traynor AGNP-C Internal medicine

## 2019-06-19 DIAGNOSIS — H2511 Age-related nuclear cataract, right eye: Secondary | ICD-10-CM | POA: Diagnosis not present

## 2019-06-19 DIAGNOSIS — E119 Type 2 diabetes mellitus without complications: Secondary | ICD-10-CM | POA: Diagnosis not present

## 2019-06-20 ENCOUNTER — Other Ambulatory Visit: Payer: Self-pay | Admitting: Adult Health

## 2019-06-25 ENCOUNTER — Encounter: Payer: Self-pay | Admitting: *Deleted

## 2019-06-25 ENCOUNTER — Other Ambulatory Visit: Payer: Self-pay

## 2019-06-25 DIAGNOSIS — R69 Illness, unspecified: Secondary | ICD-10-CM | POA: Diagnosis not present

## 2019-06-27 DIAGNOSIS — R69 Illness, unspecified: Secondary | ICD-10-CM | POA: Diagnosis not present

## 2019-06-28 ENCOUNTER — Other Ambulatory Visit: Payer: Self-pay

## 2019-06-28 ENCOUNTER — Other Ambulatory Visit
Admission: RE | Admit: 2019-06-28 | Discharge: 2019-06-28 | Disposition: A | Discharge status: Home / Self Care | Payer: Medicare HMO | Source: Ambulatory Visit | Attending: Ophthalmology | Admitting: Ophthalmology

## 2019-06-28 DIAGNOSIS — Z01812 Encounter for preprocedural laboratory examination: Secondary | ICD-10-CM | POA: Diagnosis not present

## 2019-06-28 DIAGNOSIS — Z20828 Contact with and (suspected) exposure to other viral communicable diseases: Secondary | ICD-10-CM | POA: Insufficient documentation

## 2019-06-29 LAB — SARS CORONAVIRUS 2 (TAT 6-24 HRS): SARS Coronavirus 2: NEGATIVE

## 2019-07-01 NOTE — Anesthesia Preprocedure Evaluation (Addendum)
Anesthesia Evaluation  Patient identified by MRN, date of birth, ID band Patient awake    Reviewed: Allergy & Precautions, NPO status , Patient's Chart, lab work & pertinent test results  History of Anesthesia Complications Negative for: history of anesthetic complications  Airway Mallampati: II  TM Distance: >3 FB Neck ROM: Full    Dental  (+)    Pulmonary former smoker,    breath sounds clear to auscultation       Cardiovascular hypertension, (-) angina(-) DOE  Rhythm:Regular Rate:Normal   HLD   Neuro/Psych    GI/Hepatic GERD  Medicated and Controlled,  Endo/Other  diabetes, Type 2Hypothyroidism   Renal/GU CRFRenal disease     Musculoskeletal  (+) Arthritis ,   Abdominal (+) + obese (BMI 33),   Peds  Hematology   Anesthesia Other Findings   Reproductive/Obstetrics                            Anesthesia Physical Anesthesia Plan  ASA: III  Anesthesia Plan: MAC   Post-op Pain Management:    Induction: Intravenous  PONV Risk Score and Plan:   Airway Management Planned: Nasal Cannula  Additional Equipment:   Intra-op Plan:   Post-operative Plan:   Informed Consent: I have reviewed the patients History and Physical, chart, labs and discussed the procedure including the risks, benefits and alternatives for the proposed anesthesia with the patient or authorized representative who has indicated his/her understanding and acceptance.       Plan Discussed with: CRNA and Anesthesiologist  Anesthesia Plan Comments:         Anesthesia Quick Evaluation   Active Ambulatory Problems    Diagnosis Date Noted  . ONYCHOMYCOSIS 08/17/2006  . Type 2 diabetes mellitus ([place]) 08/17/2006  . HLD (hyperlipidemia) 08/17/2006  . CATARACT NOS 08/17/2006  . Essential hypertension 08/17/2006  . ANOMALY, POSTERIOR SOFT TISSUE IMPINGEMENT 10/10/2006  . DIVERTICULOSIS, COLON 08/17/2006  .  CELLULITIS, LEG, LEFT 08/17/2006  . OSTEOARTHRITIS 08/17/2006  . FRACTURE, ANKLE 08/17/2006  . COLONIC POLYPS, HX OF 08/17/2006  . OSTEOMYELITIS, HX OF 10/10/2006  . Acute encephalopathy 08/14/2013  . Multiple closed fractures of pelvis ([place]) 03/18/2014  . Hypoglycemia 03/18/2014  . Pelvis fracture ([place]) 03/18/2014  . Syncope 03/19/2014  . ARF (acute renal failure) ([place]) 03/21/2014  . CKD (chronic kidney disease) stage 3, GFR 30-59 ml/min (HCC) 03/21/2014  . S/P total knee arthroplasty 12/28/2015  . Diverticulosis of large intestine without perforation or abscess without bleeding 08/17/2006  . Hypothyroidism 01/21/2018  . GERD (gastroesophageal reflux disease) 01/21/2018  . Closed fracture of right tibia and fibula 01/21/2018  . Right tibial fracture 01/21/2018  . Closed fracture of right lower leg 05/09/2018   Resolved Ambulatory Problems    Diagnosis Date Noted  . No Resolved Ambulatory Problems   Past Medical History:  Diagnosis Date  . Agent orange exposure   . Arthritis   . Closed pelvic fracture ([place]) 03/18/2014  . Encephalopathy acute 08/16/2013  . High cholesterol   . History of use of hearing aid in both ears   . Hypertension   . Type II diabetes mellitus (HCC)     CBC    Component Value Date/Time   WBC 8.3 05/22/2019 1027   WBC 7.7 01/25/2018 0459   RBC 4.22 05/22/2019 1027   RBC 3.26 (L) 01/25/2018 0459   HGB 13.6 05/22/2019 1027   HCT 39.2 05/22/2019 1027   PLT 119 (L) 05/22/2019 1027  MCV 93 05/22/2019 1027   MCH 32.2 05/22/2019 1027   MCH 32.4 01/25/2018 0459   MCHC 34.7 05/22/2019 1027   MCHC 34.3 01/25/2018 0459   RDW 13.6 05/22/2019 1027   LYMPHSABS 3.9 (H) 05/22/2019 1027   MONOABS 0.6 03/19/2014 0411   EOSABS 0.1 05/22/2019 1027   BASOSABS 0.1 05/22/2019 1027    CMP     Component Value Date/Time   NA 146 (H) 05/22/2019 1027   K 4.6 05/22/2019 1027   CL 106 05/22/2019 1027   CO2 25 05/22/2019 1027   GLUCOSE 137 (H) 05/22/2019 1027    GLUCOSE 137 (H) 01/24/2018 0428   BUN 16 05/22/2019 1027   CREATININE 1.12 05/22/2019 1027   CALCIUM 9.3 05/22/2019 1027   PROT 6.4 05/22/2019 1027   ALBUMIN 4.5 05/22/2019 1027   AST 20 05/22/2019 1027   ALT 24 05/22/2019 1027   ALKPHOS 79 05/22/2019 1027   BILITOT 0.3 05/22/2019 1027   GFRNONAA 62 05/22/2019 1027   GFRAA 72 05/22/2019 1027    COAGS    Component Value Date/Time   INR 0.98 01/22/2018 0014    I have seen and consented the patient, [name]. I have answered all of his questions regarding anesthesia. he is appropriately NPO.   Josephina Shih, MD Anesthesia

## 2019-07-01 NOTE — Discharge Instructions (Signed)

## 2019-07-02 ENCOUNTER — Ambulatory Visit: Payer: Medicare HMO | Admitting: Anesthesiology

## 2019-07-02 ENCOUNTER — Encounter
Admission: RE | Disposition: A | Discharge status: Home / Self Care | Payer: Self-pay | Source: Home / Self Care | Attending: Ophthalmology

## 2019-07-02 ENCOUNTER — Ambulatory Visit
Admission: RE | Admit: 2019-07-02 | Discharge: 2019-07-02 | Disposition: A | Discharge status: Home / Self Care | Payer: Medicare HMO | Attending: Ophthalmology | Admitting: Ophthalmology

## 2019-07-02 DIAGNOSIS — I10 Essential (primary) hypertension: Secondary | ICD-10-CM | POA: Insufficient documentation

## 2019-07-02 DIAGNOSIS — H25811 Combined forms of age-related cataract, right eye: Secondary | ICD-10-CM | POA: Diagnosis not present

## 2019-07-02 DIAGNOSIS — M1990 Unspecified osteoarthritis, unspecified site: Secondary | ICD-10-CM | POA: Diagnosis not present

## 2019-07-02 DIAGNOSIS — H2511 Age-related nuclear cataract, right eye: Secondary | ICD-10-CM | POA: Insufficient documentation

## 2019-07-02 DIAGNOSIS — K219 Gastro-esophageal reflux disease without esophagitis: Secondary | ICD-10-CM | POA: Insufficient documentation

## 2019-07-02 DIAGNOSIS — Z888 Allergy status to other drugs, medicaments and biological substances status: Secondary | ICD-10-CM | POA: Diagnosis not present

## 2019-07-02 DIAGNOSIS — Z6831 Body mass index (BMI) 31.0-31.9, adult: Secondary | ICD-10-CM | POA: Insufficient documentation

## 2019-07-02 DIAGNOSIS — E669 Obesity, unspecified: Secondary | ICD-10-CM | POA: Diagnosis not present

## 2019-07-02 DIAGNOSIS — Z880 Allergy status to penicillin: Secondary | ICD-10-CM | POA: Insufficient documentation

## 2019-07-02 DIAGNOSIS — E785 Hyperlipidemia, unspecified: Secondary | ICD-10-CM | POA: Insufficient documentation

## 2019-07-02 DIAGNOSIS — E1136 Type 2 diabetes mellitus with diabetic cataract: Secondary | ICD-10-CM | POA: Diagnosis not present

## 2019-07-02 DIAGNOSIS — Z85828 Personal history of other malignant neoplasm of skin: Secondary | ICD-10-CM | POA: Insufficient documentation

## 2019-07-02 DIAGNOSIS — E7800 Pure hypercholesterolemia, unspecified: Secondary | ICD-10-CM | POA: Diagnosis not present

## 2019-07-02 DIAGNOSIS — Z87891 Personal history of nicotine dependence: Secondary | ICD-10-CM | POA: Diagnosis not present

## 2019-07-02 DIAGNOSIS — Z9849 Cataract extraction status, unspecified eye: Secondary | ICD-10-CM | POA: Diagnosis not present

## 2019-07-02 DIAGNOSIS — H9193 Unspecified hearing loss, bilateral: Secondary | ICD-10-CM | POA: Insufficient documentation

## 2019-07-02 HISTORY — PX: CATARACT EXTRACTION W/PHACO: SHX586

## 2019-07-02 HISTORY — DX: Personal history of other medical treatment: Z92.89

## 2019-07-02 LAB — GLUCOSE, CAPILLARY
Glucose-Capillary: 254 mg/dL — ABNORMAL HIGH (ref 70–99)
Glucose-Capillary: 251 mg/dL — ABNORMAL HIGH (ref 70–99)

## 2019-07-02 SURGERY — PHACOEMULSIFICATION, CATARACT, WITH IOL INSERTION
Anesthesia: Monitor Anesthesia Care | Site: Eye | Laterality: Right

## 2019-07-02 MED ORDER — INSULIN LISPRO 100 UNIT/ML ~~LOC~~ SOLN
3.0000 [IU] | Freq: Once | SUBCUTANEOUS | Status: AC
  Administered 2019-07-02: 3 [IU] via SUBCUTANEOUS

## 2019-07-02 MED ORDER — FENTANYL CITRATE (PF) 100 MCG/2ML IJ SOLN
INTRAMUSCULAR | Status: DC | PRN
  Administered 2019-07-02: 50 ug via INTRAVENOUS

## 2019-07-02 MED ORDER — ARMC OPHTHALMIC DILATING DROPS
1.0000 "application " | OPHTHALMIC | Status: DC | PRN
  Administered 2019-07-02 (×3): 1 via OPHTHALMIC

## 2019-07-02 MED ORDER — ACETAMINOPHEN 10 MG/ML IV SOLN: 1000.0000 mg | Freq: Once | INTRAVENOUS | Status: DC | PRN

## 2019-07-02 MED ORDER — TETRACAINE HCL 0.5 % OP SOLN
1.0000 [drp] | OPHTHALMIC | Status: DC | PRN
  Administered 2019-07-02 (×3): 1 [drp] via OPHTHALMIC

## 2019-07-02 MED ORDER — MIDAZOLAM HCL 2 MG/2ML IJ SOLN
INTRAMUSCULAR | Status: DC | PRN
  Administered 2019-07-02: 1 mg via INTRAVENOUS

## 2019-07-02 MED ORDER — LACTATED RINGERS IV SOLN: 100.0000 mL/h | INTRAVENOUS | Status: DC

## 2019-07-02 MED ORDER — MOXIFLOXACIN HCL 0.5 % OP SOLN
OPHTHALMIC | Status: DC | PRN
  Administered 2019-07-02: 0.2 mL via OPHTHALMIC

## 2019-07-02 MED ORDER — NA CHONDROIT SULF-NA HYALURON 40-17 MG/ML IO SOLN
INTRAOCULAR | Status: DC | PRN
  Administered 2019-07-02: 1 mL via INTRAOCULAR

## 2019-07-02 MED ORDER — BRIMONIDINE TARTRATE-TIMOLOL 0.2-0.5 % OP SOLN
OPHTHALMIC | Status: DC | PRN
  Administered 2019-07-02: 1 [drp] via OPHTHALMIC

## 2019-07-02 MED ORDER — ONDANSETRON HCL 4 MG/2ML IJ SOLN: 4.0000 mg | Freq: Once | INTRAMUSCULAR | Status: DC | PRN

## 2019-07-02 MED ORDER — EPINEPHRINE PF 1 MG/ML IJ SOLN
INTRAOCULAR | Status: DC | PRN
  Administered 2019-07-02: 90 mL via OPHTHALMIC

## 2019-07-02 MED ORDER — LIDOCAINE HCL (PF) 2 % IJ SOLN
INTRAOCULAR | Status: DC | PRN
  Administered 2019-07-02: 1 mL

## 2019-07-02 SURGICAL SUPPLY — 20 items
CANNULA ANT/CHMB 27G (MISCELLANEOUS) ×2 IMPLANT
CANNULA ANT/CHMB 27GA (MISCELLANEOUS) ×4 IMPLANT
GLOVE SURG LX 8.0 MICRO (GLOVE) ×1
GLOVE SURG LX STRL 8.0 MICRO (GLOVE) ×1 IMPLANT
GLOVE SURG TRIUMPH 8.0 PF LTX (GLOVE) ×2 IMPLANT
GOWN STRL REUS W/ TWL LRG LVL3 (GOWN DISPOSABLE) ×2 IMPLANT
GOWN STRL REUS W/TWL LRG LVL3 (GOWN DISPOSABLE) ×2
LENS IOL TECNIS ITEC 19.0 (Intraocular Lens) ×1 IMPLANT
MARKER SKIN DUAL TIP RULER LAB (MISCELLANEOUS) ×2 IMPLANT
NDL FILTER BLUNT 18X1 1/2 (NEEDLE) ×1 IMPLANT
NDL RETROBULBAR .5 NSTRL (NEEDLE) ×2 IMPLANT
NEEDLE FILTER BLUNT 18X 1/2SAF (NEEDLE) ×1
NEEDLE FILTER BLUNT 18X1 1/2 (NEEDLE) ×1 IMPLANT
PACK EYE AFTER SURG (MISCELLANEOUS) ×2 IMPLANT
PACK OPTHALMIC (MISCELLANEOUS) ×2 IMPLANT
PACK PORFILIO (MISCELLANEOUS) ×2 IMPLANT
SYR 3ML LL SCALE MARK (SYRINGE) ×2 IMPLANT
SYR TB 1ML LUER SLIP (SYRINGE) ×2 IMPLANT
WATER STERILE IRR 250ML POUR (IV SOLUTION) ×2 IMPLANT
WIPE NON LINTING 3.25X3.25 (MISCELLANEOUS) ×2 IMPLANT

## 2019-07-02 NOTE — Anesthesia Postprocedure Evaluation (Signed)
Anesthesia Post Note  Patient: [name]  Procedure(s) Performed: CATARACT EXTRACTION PHACO AND INTRAOCULAR LENS PLACEMENT (IOC) RIGHT DIABETIC  01:53.2  18.9%  21.43 (Right Eye)  Patient location during evaluation: PACU Anesthesia Type: MAC Level of consciousness: awake and alert Pain management: pain level controlled Vital Signs Assessment: post-procedure vital signs reviewed and stable Respiratory status: spontaneous breathing, nonlabored ventilation, respiratory function stable and patient connected to nasal cannula oxygen Cardiovascular status: stable and blood pressure returned to baseline Postop Assessment: no apparent nausea or vomiting Anesthetic complications: no    Jayshaun Phillips A  Carlei Huang

## 2019-07-02 NOTE — Op Note (Signed)
PREOPERATIVE DIAGNOSIS:  Nuclear sclerotic cataract of the right eye.   POSTOPERATIVE DIAGNOSIS:  H25.11  CATARACT   OPERATIVE PROCEDURE: Procedure(s): CATARACT EXTRACTION PHACO AND INTRAOCULAR LENS PLACEMENT (IOC) RIGHT DIABETIC  01:53.2  18.9%  21.43   SURGEON:  Birder Robson, MD.   ANESTHESIA:  Anesthesiologist: Heniser, Fredric Dine, MD CRNA: Cameron Ali, CRNA  1.      Managed anesthesia care. 2.      0.58ml of Shugarcaine was instilled in the eye following the paracentesis.   COMPLICATIONS:  None.   TECHNIQUE:   Stop and chop   DESCRIPTION OF PROCEDURE:  The patient was examined and consented in the preoperative holding area where the aforementioned topical anesthesia was applied to the right eye and then brought back to the Operating Room where the right eye was prepped and draped in the usual sterile ophthalmic fashion and a lid speculum was placed. A paracentesis was created with the side port blade and the anterior chamber was filled with viscoelastic. A near clear corneal incision was performed with the steel keratome. A continuous curvilinear capsulorrhexis was performed with a cystotome followed by the capsulorrhexis forceps. Hydrodissection and hydrodelineation were carried out with BSS on a blunt cannula. The lens was removed in a stop and chop  technique and the remaining cortical material was removed with the irrigation-aspiration handpiece. The capsular bag was inflated with viscoelastic and the Technis [id number]  lens was placed in the capsular bag without complication. The remaining viscoelastic was removed from the eye with the irrigation-aspiration handpiece. The wounds were hydrated. The anterior chamber was flushed with Miostat and the eye was inflated to physiologic pressure. 0.66ml of Vigamox was placed in the anterior chamber. The wounds were found to be water tight. The eye was dressed with Vigamox. The patient was given protective glasses to wear throughout the day and a  shield with which to sleep tonight. The patient was also given drops with which to begin a drop regimen today and will follow-up with me in one day. Implant Name Type Inv. Item Serial No. Manufacturer Lot No. LRB No. Used Action  LENS IOL DIOP 19.0 - [id number] Intraocular Lens LENS IOL DIOP 19.0 [id number] AMO  Right 1 Implanted   Procedure(s) with comments: CATARACT EXTRACTION PHACO AND INTRAOCULAR LENS PLACEMENT (IOC) RIGHT DIABETIC  01:53.2  18.9%  21.43 (Right) - Diabetic - insulin  Electronically signed: Birder Robson 07/02/2019 7:53 AM

## 2019-07-02 NOTE — Anesthesia Procedure Notes (Signed)
Procedure Name: MAC Date/Time: 07/02/2019 7:31 AM Performed by: Cameron Ali, CRNA Pre-anesthesia Checklist: Patient identified, Emergency Drugs available, Suction available, Timeout performed and Patient being monitored Patient Re-evaluated:Patient Re-evaluated prior to induction Oxygen Delivery Method: Nasal cannula Placement Confirmation: positive ETCO2

## 2019-07-02 NOTE — Transfer of Care (Signed)
Immediate Anesthesia Transfer of Care Note  Patient: [name]  Procedure(s) Performed: CATARACT EXTRACTION PHACO AND INTRAOCULAR LENS PLACEMENT (IOC) RIGHT DIABETIC (Right Eye)  Patient Location: PACU  Anesthesia Type: MAC  Level of Consciousness: awake, alert  and patient cooperative  Airway and Oxygen Therapy: Patient Spontanous Breathing and Patient connected to supplemental oxygen  Post-op Assessment: Post-op Vital signs reviewed, Patient's Cardiovascular Status Stable, Respiratory Function Stable, Patent Airway and No signs of Nausea or vomiting  Post-op Vital Signs: Reviewed and stable  Complications: No apparent anesthesia complications

## 2019-07-02 NOTE — H&P (Signed)
All labs reviewed. Abnormal studies sent to patients PCP when indicated.  Previous H&P reviewed, patient examined, there are NO CHANGES.  Tony Mohammad Porfilio9/1/20207:21 AM

## 2019-07-03 ENCOUNTER — Encounter: Payer: Self-pay | Admitting: Ophthalmology

## 2019-07-10 ENCOUNTER — Encounter: Payer: Self-pay | Admitting: Adult Health

## 2019-07-11 DIAGNOSIS — R69 Illness, unspecified: Secondary | ICD-10-CM | POA: Diagnosis not present

## 2019-08-01 DIAGNOSIS — R69 Illness, unspecified: Secondary | ICD-10-CM | POA: Diagnosis not present

## 2019-09-10 ENCOUNTER — Telehealth: Payer: Self-pay

## 2019-09-10 NOTE — Telephone Encounter (Signed)
Confirmed pt appt , BR °

## 2019-09-12 ENCOUNTER — Ambulatory Visit (INDEPENDENT_AMBULATORY_CARE_PROVIDER_SITE_OTHER): Payer: Medicare HMO | Admitting: Adult Health

## 2019-09-12 ENCOUNTER — Other Ambulatory Visit: Payer: Self-pay

## 2019-09-12 ENCOUNTER — Encounter: Payer: Self-pay | Admitting: Adult Health

## 2019-09-12 VITALS — BP 140/84 | HR 89 | Resp 16 | Ht 71.0 in | Wt 228.0 lb

## 2019-09-12 DIAGNOSIS — I10 Essential (primary) hypertension: Secondary | ICD-10-CM

## 2019-09-12 DIAGNOSIS — K219 Gastro-esophageal reflux disease without esophagitis: Secondary | ICD-10-CM | POA: Diagnosis not present

## 2019-09-12 DIAGNOSIS — E1165 Type 2 diabetes mellitus with hyperglycemia: Secondary | ICD-10-CM

## 2019-09-12 LAB — POCT GLYCOSYLATED HEMOGLOBIN (HGB A1C): Hemoglobin A1C: 6.5 % — AB (ref 4.0–5.6)

## 2019-09-12 NOTE — Progress Notes (Signed)
[facility] [place], [place] [address]  Internal MEDICINE  Office Visit Note  Patient Name: [name]  [id number]  [id number]  Date of Service: 09/12/2019  Chief Complaint  Patient presents with  . Diabetes  . Hyperlipidemia  . Hypertension    HPI  Pt is here for follow up on DM, HTN, and HLD.  He reports overall he is doing well. He denies any complaints at this time. His blood pressure is controlled.  He Denies Chest pain, Shortness of breath, palpitations, headache, or blurred vision.  He denies any issues with his DM.  He uses a realtime glucose monitor, and is very happy with this.  His A1C is 6.5 today.    Current Medication: Outpatient Encounter Medications as of 09/12/2019  Medication Sig  . amoxicillin (AMOXIL) 500 MG tablet Take 2,000 mg by mouth as needed (Prior to dental procedures).  Marland Kitchen aspirin EC 81 MG tablet Take 81 mg by mouth daily.  Marland Kitchen atorvastatin (LIPITOR) 40 MG tablet TAKE 1 TABLET BY MOUTH EVERY DAY  . benazepril (LOTENSIN) 40 MG tablet TAKE 1 TABLET BY MOUTH EVERY DAY  . Cholecalciferol 1000 units capsule Take 1,000 Units by mouth daily.  . Cinnamon 500 MG TABS Take 1,000 mg by mouth daily.  . Glucosamine-Chondroitin 500-400 MG CAPS Take 3 tablets by mouth daily.  Marland Kitchen HUMULIN R 100 UNIT/ML injection Inject 0.15-0.17 mLs (15-17 Units total) into the skin 2 (two) times daily before a meal. And per sliding scale Pt is not to get novolin at all (Patient taking differently: Inject 15-17 Units into the skin 2 (two) times daily before a meal. 16 units in the morning and per sliding scale Pt is not to get novolin at all)  . hydrochlorothiazide (HYDRODIURIL) 25 MG tablet TAKE 1 TABLET BY MOUTH EVERY DAY IN THE MORNING  . Insulin Glargine (LANTUS SOLOSTAR) 100 UNIT/ML Solostar Pen Inject 65 Units into the skin daily at 10 pm.  . insulin glargine (LANTUS) 100 UNIT/ML injection Inject 0.56 mLs (56 Units total) into the skin daily with  breakfast.  . insulin regular (HUMULIN R) 100 units/mL injection USE 17 UNITS SUBCUTANEOUSLY TWICE DAILY WITH MEALS AND USE A SLIDING SCALE IF SUGARS GET HIGH  . [place] Ginseng 1000 MG TABS Take 1,000 mg by mouth daily.  . Omega 3 1200 MG CAPS Take 1,200 mg by mouth daily.  . ONE TOUCH ULTRA TEST test strip CHECK SUGAR 5 X A DAY FOR DX 250.3 & FLUCTUATING SUGAR  . pantoprazole (PROTONIX) 40 MG tablet TAKE 1 TABLET BY MOUTH EVERY DAY  . pyridOXINE (VITAMIN B-6) 100 MG tablet Take 100 mg by mouth daily.  Marland Kitchen thyroid (ARMOUR) 120 MG tablet Take 120 mg by mouth daily before breakfast.  . zinc gluconate 50 MG tablet Take 50 mg by mouth daily.   No facility-administered encounter medications on file as of 09/12/2019.     Surgical History: Past Surgical History:  Procedure Laterality Date  . CARPAL TUNNEL RELEASE Right 1980's  . CARPAL TUNNEL RELEASE Left 05/05/2016   Procedure: CARPAL TUNNEL RELEASE;  Surgeon: Hessie Knows, MD;  Location: [facility];  Service: Orthopedics;  Laterality: Left;  . CATARACT EXTRACTION W/PHACO Left 05/12/2015   Procedure: CATARACT EXTRACTION PHACO AND INTRAOCULAR LENS PLACEMENT (IOC);  Surgeon: Birder Robson, MD;  Location: [facility];  Service: Ophthalmology;  Laterality: Left;  [place] 01:53.1AP% 21.4CDE 24.58fluid pack lot # [id number]  . CATARACT EXTRACTION W/PHACO Right 07/02/2019   Procedure: CATARACT EXTRACTION  PHACO AND INTRAOCULAR LENS PLACEMENT (IOC) RIGHT DIABETIC  01:53.2  18.9%  21.43;  Surgeon: Galen Manila, MD;  Location: [facility];  Service: Ophthalmology;  Laterality: Right;  Diabetic - insulin  . EYE SURGERY Left    cataract extractions  . INCISION AND DRAINAGE OF WOUND Left 1988   "staph infection" left leg  . INTRAMEDULLARY (IM) NAIL INTERTROCHANTERIC Right 01/22/2018   Procedure: INTRAMEDULLARY (IM) NAIL INTERTROCHANTRIC;  Surgeon: Juanell Fairly, MD;  Location: [facility];  Service: Orthopedics;  Laterality: Right;  . KNEE ARTHROPLASTY Left  12/28/2015   Procedure: COMPUTER ASSISTED TOTAL KNEE ARTHROPLASTY;  Surgeon: Donato Heinz, MD;  Location: [facility];  Service: Orthopedics;  Laterality: Left;  . ORIF WRIST FRACTURE Left 05/05/2016   Procedure: OPEN REDUCTION INTERNAL FIXATION (ORIF) WRIST FRACTURE;  Surgeon: Kennedy Bucker, MD;  Location: [facility];  Service: Orthopedics;  Laterality: Left;    Medical History: Past Medical History:  Diagnosis Date  . Agent orange exposure    "[name]"  . Arthritis    "left knee" (03/18/2014)  . Closed pelvic fracture (HCC) 03/18/2014   "multiple S/P fall w/loss of consciousness; CBG 29"  . Encephalopathy acute 08/16/2013  . GERD (gastroesophageal reflux disease)   . High cholesterol   . History of use of hearing aid in both ears    does not currently wear  . Hypertension   . Hypothyroidism   . Type II diabetes mellitus (HCC)     Family History: Family History  Problem Relation Age of Onset  . Colon cancer Mother   . Cerebral aneurysm Father   . Prostate cancer Neg Hx   . Bladder Cancer Neg Hx   . Kidney cancer Neg Hx     Social History   Socioeconomic History  . Marital status: Married    Spouse name: Not on file  . Number of children: Not on file  . Years of education: Not on file  . Highest education level: Not on file  Occupational History  . Not on file  Social Needs  . Financial resource strain: Not on file  . Food insecurity    Worry: Not on file    Inability: Not on file  . Transportation needs    Medical: Not on file    Non-medical: Not on file  Tobacco Use  . Smoking status: Former Smoker    Packs/day: 3.00    Years: 30.00    Pack years: 90.00    Types: Cigarettes    Quit date: 1988    Years since quitting: 32.8  . Smokeless tobacco: Never Used  . Tobacco comment: "quit smoking in 1988"  Substance and Sexual Activity  . Alcohol use: No  . Drug use: No  . Sexual activity: Never  Lifestyle  . Physical activity    Days per week: Not on file     Minutes per session: Not on file  . Stress: Not on file  Relationships  . Social Musician on phone: Not on file    Gets together: Not on file    Attends religious service: Not on file    Active member of club or organization: Not on file    Attends meetings of clubs or organizations: Not on file    Relationship status: Not on file  . Intimate partner violence    Fear of current or ex partner: Not on file    Emotionally abused: Not on file    Physically abused: Not  on file    Forced sexual activity: Not on file  Other Topics Concern  . Not on file  Social History Narrative  . Not on file      Review of Systems  Constitutional: Negative.  Negative for chills, fatigue and unexpected weight change.  HENT: Negative.  Negative for congestion, rhinorrhea, sneezing and sore throat.   Eyes: Negative for redness.  Respiratory: Negative.  Negative for cough, chest tightness and shortness of breath.   Cardiovascular: Negative.  Negative for chest pain and palpitations.  Gastrointestinal: Negative.  Negative for abdominal pain, constipation, diarrhea, nausea and vomiting.  Endocrine: Negative.   Genitourinary: Negative.  Negative for dysuria and frequency.  Musculoskeletal: Negative.  Negative for arthralgias, back pain, joint swelling and neck pain.  Skin: Negative.  Negative for rash.  Allergic/Immunologic: Negative.   Neurological: Negative.  Negative for tremors and numbness.  Hematological: Negative for adenopathy. Does not bruise/bleed easily.  Psychiatric/Behavioral: Negative.  Negative for behavioral problems, sleep disturbance and suicidal ideas. The patient is not nervous/anxious.     Vital Signs: BP 140/84   Pulse 89   Resp 16   Ht 5\' 11"  (1.803 m)   Wt 228 lb (103.4 kg)   SpO2 94%   BMI 31.80 kg/m    Physical Exam Vitals signs and nursing note reviewed.  Constitutional:      General: He is not in acute distress.    Appearance: He is well-developed. He  is not diaphoretic.  HENT:     Head: Normocephalic and atraumatic.     Mouth/Throat:     Pharynx: No oropharyngeal exudate.  Eyes:     Pupils: Pupils are equal, round, and reactive to light.  Neck:     Musculoskeletal: Normal range of motion and neck supple.     Thyroid: No thyromegaly.     Vascular: No JVD.     Trachea: No tracheal deviation.  Cardiovascular:     Rate and Rhythm: Normal rate and regular rhythm.     Heart sounds: Normal heart sounds. No murmur. No friction rub. No gallop.   Pulmonary:     Effort: Pulmonary effort is normal. No respiratory distress.     Breath sounds: Normal breath sounds. No wheezing or rales.  Chest:     Chest wall: No tenderness.  Abdominal:     Palpations: Abdomen is soft.     Tenderness: There is no abdominal tenderness. There is no guarding.  Musculoskeletal: Normal range of motion.  Lymphadenopathy:     Cervical: No cervical adenopathy.  Skin:    General: Skin is warm and dry.  Neurological:     Mental Status: He is alert and oriented to person, place, and time.     Cranial Nerves: No cranial nerve deficit.  Psychiatric:        Behavior: Behavior normal.        Thought Content: Thought content normal.        Judgment: Judgment normal.    Assessment/Plan: 1. Uncontrolled type 2 diabetes mellitus with hyperglycemia (HCC) A1C is maintaining at 6.5  He has excellent surveillance of his blood sugar through his Dexcom G6.  Pt will continue current management.  He also reports his diet is improving, because his wife has started cooking again.   - POCT HgB A1C  2. Essential hypertension Stable, continue current medication.   3. Gastroesophageal reflux disease without esophagitis Denies any issues currently, not taking any medications at this time.   General Counseling: Joaquim Namonald verbalizes  understanding of the findings of todays visit and agrees with plan of treatment. I have discussed any further diagnostic evaluation that may be needed or  ordered today. We also reviewed his medications today. he has been encouraged to call the office with any questions or concerns that should arise related to todays visit.  Hypertension Counseling:   The following hypertensive lifestyle modification were recommended and discussed:  1. Limiting alcohol intake to less than 1 oz/day of ethanol:(24 oz of beer or 8 oz of wine or 2 oz of 100-proof whiskey). 2. Take baby ASA 81 mg daily. 3. Importance of regular aerobic exercise and losing weight. 4. Reduce dietary saturated fat and cholesterol intake for overall cardiovascular health. 5. Maintaining adequate dietary potassium, calcium, and magnesium intake. 6. Regular monitoring of the blood pressure. 7. Reduce sodium intake to less than 100 mmol/day (less than 2.3 gm of sodium or less than 6 gm of sodium choride)   Orders Placed This Encounter  Procedures  . POCT HgB A1C    No orders of the defined types were placed in this encounter.   Time spent: 25 Minutes   This patient was seen by Blima Ledger AGNP-C in Collaboration with Dr Lyndon Code as a part of collaborative care agreement     Johnna Acosta AGNP-C Internal medicine

## 2019-09-14 ENCOUNTER — Other Ambulatory Visit: Payer: Self-pay | Admitting: Internal Medicine

## 2019-12-10 ENCOUNTER — Telehealth: Payer: Self-pay

## 2019-12-10 NOTE — Telephone Encounter (Signed)
Confirmed appointment on 12/12/2019 and screened for covid. klh 

## 2019-12-12 ENCOUNTER — Other Ambulatory Visit: Payer: Self-pay

## 2019-12-12 ENCOUNTER — Encounter: Payer: Self-pay | Admitting: Adult Health

## 2019-12-12 ENCOUNTER — Ambulatory Visit (INDEPENDENT_AMBULATORY_CARE_PROVIDER_SITE_OTHER): Payer: Medicare HMO | Admitting: Adult Health

## 2019-12-12 VITALS — BP 148/63 | HR 84 | Temp 97.9°F | Resp 16 | Ht 71.0 in | Wt 232.8 lb

## 2019-12-12 DIAGNOSIS — E1165 Type 2 diabetes mellitus with hyperglycemia: Secondary | ICD-10-CM

## 2019-12-12 DIAGNOSIS — E782 Mixed hyperlipidemia: Secondary | ICD-10-CM

## 2019-12-12 DIAGNOSIS — K219 Gastro-esophageal reflux disease without esophagitis: Secondary | ICD-10-CM | POA: Diagnosis not present

## 2019-12-12 DIAGNOSIS — I10 Essential (primary) hypertension: Secondary | ICD-10-CM | POA: Diagnosis not present

## 2019-12-12 LAB — POCT GLYCOSYLATED HEMOGLOBIN (HGB A1C): Hemoglobin A1C: 6.8 % — AB (ref 4.0–5.6)

## 2019-12-12 NOTE — Progress Notes (Signed)
[facility] [address] [place], [place] [address]  Internal MEDICINE  Office Visit Note  Patient Name: [name]  [id number]  [id number]  Date of Service: 12/12/2019  Chief Complaint  Patient presents with  . Follow-up  . Diabetes  . Gastroesophageal Reflux  . Hyperlipidemia  . Hypertension    HPI  Pt is here for follow up on DM, GERD, HLD and Hypertension.  He reports his blood sugars have been doing well.  He has a Dexcom continuous meter and has excellent control with this. His blood pressure has been controlled.  Denies Chest pain, Shortness of breath, palpitations, headache, or blurred vision.  He denies any issues with GERD recently.      Current Medication: Outpatient Encounter Medications as of 12/12/2019  Medication Sig  . amoxicillin (AMOXIL) 500 MG tablet Take 2,000 mg by mouth as needed (Prior to dental procedures).  Marland Kitchen aspirin EC 81 MG tablet Take 81 mg by mouth daily.  Marland Kitchen atorvastatin (LIPITOR) 40 MG tablet TAKE 1 TABLET BY MOUTH EVERY DAY  . benazepril (LOTENSIN) 40 MG tablet TAKE 1 TABLET BY MOUTH EVERY DAY  . Cholecalciferol 1000 units capsule Take 1,000 Units by mouth daily.  . Cinnamon 500 MG TABS Take 1,000 mg by mouth daily.  . Glucosamine-Chondroitin 500-400 MG CAPS Take 3 tablets by mouth daily.  Marland Kitchen HUMULIN R 100 UNIT/ML injection Inject 0.15-0.17 mLs (15-17 Units total) into the skin 2 (two) times daily before a meal. And per sliding scale Pt is not to get novolin at all (Patient taking differently: Inject 15-17 Units into the skin 2 (two) times daily before a meal. 16 units in the morning and per sliding scale Pt is not to get novolin at all)  . hydrochlorothiazide (HYDRODIURIL) 25 MG tablet TAKE 1 TABLET BY MOUTH EVERY DAY IN THE MORNING  . Insulin Glargine (LANTUS SOLOSTAR) 100 UNIT/ML Solostar Pen Inject 65 Units into the skin daily at 10 pm.  . insulin glargine (LANTUS) 100 UNIT/ML injection Inject 0.56 mLs (56 Units total) into the  skin daily with breakfast.  . insulin regular (HUMULIN R) 100 units/mL injection USE 17 UNITS SUBCUTANEOUSLY TWICE DAILY WITH MEALS AND USE A SLIDING SCALE IF SUGARS GET HIGH  . [place] Ginseng 1000 MG TABS Take 1,000 mg by mouth daily.  . Omega 3 1200 MG CAPS Take 1,200 mg by mouth daily.  . ONE TOUCH ULTRA TEST test strip CHECK SUGAR 5 X A DAY FOR DX 250.3 & FLUCTUATING SUGAR  . pantoprazole (PROTONIX) 40 MG tablet TAKE 1 TABLET BY MOUTH EVERY DAY  . pyridOXINE (VITAMIN B-6) 100 MG tablet Take 100 mg by mouth daily.  Marland Kitchen thyroid (ARMOUR) 120 MG tablet Take 120 mg by mouth daily before breakfast.  . zinc gluconate 50 MG tablet Take 50 mg by mouth daily.   No facility-administered encounter medications on file as of 12/12/2019.    Surgical History: Past Surgical History:  Procedure Laterality Date  . CARPAL TUNNEL RELEASE Right 1980's  . CARPAL TUNNEL RELEASE Left 05/05/2016   Procedure: CARPAL TUNNEL RELEASE;  Surgeon: Kennedy Bucker, MD;  Location: [facility];  Service: Orthopedics;  Laterality: Left;  . CATARACT EXTRACTION W/PHACO Left 05/12/2015   Procedure: CATARACT EXTRACTION PHACO AND INTRAOCULAR LENS PLACEMENT (IOC);  Surgeon: Galen Manila, MD;  Location: [facility];  Service: Ophthalmology;  Laterality: Left;  [place] 01:53.1AP% 21.4CDE 24.15fluid pack lot # [id number]  . CATARACT EXTRACTION W/PHACO Right 07/02/2019   Procedure: CATARACT EXTRACTION PHACO AND  INTRAOCULAR LENS PLACEMENT (IOC) RIGHT DIABETIC  01:53.2  18.9%  21.43;  Surgeon: Birder Robson, MD;  Location: [facility];  Service: Ophthalmology;  Laterality: Right;  Diabetic - insulin  . EYE SURGERY Left    cataract extractions  . INCISION AND DRAINAGE OF WOUND Left 1988   "staph infection" left leg  . INTRAMEDULLARY (IM) NAIL INTERTROCHANTERIC Right 01/22/2018   Procedure: INTRAMEDULLARY (IM) NAIL INTERTROCHANTRIC;  Surgeon: Thornton Park, MD;  Location: [facility];  Service: Orthopedics;  Laterality: Right;  . KNEE  ARTHROPLASTY Left 12/28/2015   Procedure: COMPUTER ASSISTED TOTAL KNEE ARTHROPLASTY;  Surgeon: Dereck Leep, MD;  Location: [facility];  Service: Orthopedics;  Laterality: Left;  . ORIF WRIST FRACTURE Left 05/05/2016   Procedure: OPEN REDUCTION INTERNAL FIXATION (ORIF) WRIST FRACTURE;  Surgeon: Hessie Knows, MD;  Location: [facility];  Service: Orthopedics;  Laterality: Left;    Medical History: Past Medical History:  Diagnosis Date  . Agent orange exposure    "[place]"  . Arthritis    "left knee" (03/18/2014)  . Closed pelvic fracture ([place]) 03/18/2014   "multiple S/P fall w/loss of consciousness; CBG 29"  . Encephalopathy acute 08/16/2013  . GERD (gastroesophageal reflux disease)   . High cholesterol   . History of use of hearing aid in both ears    does not currently wear  . Hypertension   . Hypothyroidism   . Type II diabetes mellitus (HCC)     Family History: Family History  Problem Relation Age of Onset  . Colon cancer Mother   . Cerebral aneurysm Father   . Prostate cancer Neg Hx   . Bladder Cancer Neg Hx   . Kidney cancer Neg Hx     Social History   Socioeconomic History  . Marital status: Married    Spouse name: Not on file  . Number of children: Not on file  . Years of education: Not on file  . Highest education level: Not on file  Occupational History  . Not on file  Tobacco Use  . Smoking status: Former Smoker    Packs/day: 3.00    Years: 30.00    Pack years: 90.00    Types: Cigarettes    Quit date: 1988    Years since quitting: 33.1  . Smokeless tobacco: Never Used  . Tobacco comment: "quit smoking in 1988"  Substance and Sexual Activity  . Alcohol use: No  . Drug use: No  . Sexual activity: Never  Other Topics Concern  . Not on file  Social History Narrative  . Not on file   Social Determinants of Health   Financial Resource Strain:   . Difficulty of Paying Living Expenses: Not on file  Food Insecurity:   . Worried About Charity fundraiser  in the Last Year: Not on file  . Ran Out of Food in the Last Year: Not on file  Transportation Needs:   . Lack of Transportation (Medical): Not on file  . Lack of Transportation (Non-Medical): Not on file  Physical Activity:   . Days of Exercise per Week: Not on file  . Minutes of Exercise per Session: Not on file  Stress:   . Feeling of Stress : Not on file  Social Connections:   . Frequency of Communication with Friends and Family: Not on file  . Frequency of Social Gatherings with Friends and Family: Not on file  . Attends Religious Services: Not on file  . Active Member of Clubs or  Organizations: Not on file  . Attends Banker Meetings: Not on file  . Marital Status: Not on file  Intimate Partner Violence:   . Fear of Current or Ex-Partner: Not on file  . Emotionally Abused: Not on file  . Physically Abused: Not on file  . Sexually Abused: Not on file      Review of Systems  Constitutional: Negative.  Negative for chills, fatigue and unexpected weight change.  HENT: Negative.  Negative for congestion, rhinorrhea, sneezing and sore throat.   Eyes: Negative for redness.  Respiratory: Negative.  Negative for cough, chest tightness and shortness of breath.   Cardiovascular: Negative.  Negative for chest pain and palpitations.  Gastrointestinal: Negative.  Negative for abdominal pain, constipation, diarrhea, nausea and vomiting.  Endocrine: Negative.   Genitourinary: Negative.  Negative for dysuria and frequency.  Musculoskeletal: Negative.  Negative for arthralgias, back pain, joint swelling and neck pain.  Skin: Negative.  Negative for rash.  Allergic/Immunologic: Negative.   Neurological: Negative.  Negative for tremors and numbness.  Hematological: Negative for adenopathy. Does not bruise/bleed easily.  Psychiatric/Behavioral: Negative.  Negative for behavioral problems, sleep disturbance and suicidal ideas. The patient is not nervous/anxious.     Vital  Signs: BP (!) 148/63   Pulse 84   Temp 97.9 F (36.6 C)   Resp 16   Ht 5\' 11"  (1.803 m)   Wt 232 lb 12.8 oz (105.6 kg)   SpO2 96%   BMI 32.47 kg/m    Physical Exam Vitals and nursing note reviewed.  Constitutional:      General: He is not in acute distress.    Appearance: He is well-developed. He is not diaphoretic.  HENT:     Head: Normocephalic and atraumatic.     Mouth/Throat:     Pharynx: No oropharyngeal exudate.  Eyes:     Pupils: Pupils are equal, round, and reactive to light.  Neck:     Thyroid: No thyromegaly.     Vascular: No JVD.     Trachea: No tracheal deviation.  Cardiovascular:     Rate and Rhythm: Normal rate and regular rhythm.     Heart sounds: Normal heart sounds. No murmur. No friction rub. No gallop.   Pulmonary:     Effort: Pulmonary effort is normal. No respiratory distress.     Breath sounds: Normal breath sounds. No wheezing or rales.  Chest:     Chest wall: No tenderness.  Abdominal:     Palpations: Abdomen is soft.     Tenderness: There is no abdominal tenderness. There is no guarding.  Musculoskeletal:        General: Normal range of motion.     Cervical back: Normal range of motion and neck supple.  Lymphadenopathy:     Cervical: No cervical adenopathy.  Skin:    General: Skin is warm and dry.  Neurological:     Mental Status: He is alert and oriented to person, place, and time.     Cranial Nerves: No cranial nerve deficit.  Psychiatric:        Behavior: Behavior normal.        Thought Content: Thought content normal.        Judgment: Judgment normal.    Assessment/Plan: 1. Type 2 diabetes mellitus with hyperglycemia, unspecified whether long term insulin use (HCC) A1C 6.8 today. He is doing well with Lantus (from ) Continue with Humulin also.   - POCT HgB A1C  2. Essential hypertension Stable continue  present management.   3. Gastroesophageal reflux disease without esophagitis Continue Protonix as directed.   4. Mixed  hyperlipidemia Continue to monitor, and continue Lipitor.   General Counseling: Joaquim Nam understanding of the findings of todays visit and agrees with plan of treatment. I have discussed any further diagnostic evaluation that may be needed or ordered today. We also reviewed his medications today. he has been encouraged to call the office with any questions or concerns that should arise related to todays visit.    Orders Placed This Encounter  Procedures  . POCT HgB A1C    No orders of the defined types were placed in this encounter.   Time spent: 25 Minutes   This patient was seen by Blima Ledger AGNP-C in Collaboration with Dr Lyndon Code as a part of collaborative care agreement     Johnna Acosta AGNP-C Internal medicine

## 2020-02-19 DIAGNOSIS — E119 Type 2 diabetes mellitus without complications: Secondary | ICD-10-CM | POA: Diagnosis not present

## 2020-02-26 DIAGNOSIS — R69 Illness, unspecified: Secondary | ICD-10-CM | POA: Diagnosis not present

## 2020-03-06 ENCOUNTER — Telehealth: Payer: Self-pay

## 2020-03-06 NOTE — Telephone Encounter (Signed)
Confirmed appointment on 03/10/2020 and screened for covid. klh °

## 2020-03-08 ENCOUNTER — Other Ambulatory Visit: Payer: Self-pay | Admitting: Adult Health

## 2020-03-10 ENCOUNTER — Ambulatory Visit: Payer: Medicare HMO | Admitting: Adult Health

## 2020-03-11 ENCOUNTER — Other Ambulatory Visit: Payer: Self-pay | Admitting: Internal Medicine

## 2020-03-16 ENCOUNTER — Telehealth: Payer: Self-pay

## 2020-03-16 NOTE — Telephone Encounter (Signed)
Confirmed appointment on 03/18/2020 and screened for covid. klh 

## 2020-03-18 ENCOUNTER — Ambulatory Visit (INDEPENDENT_AMBULATORY_CARE_PROVIDER_SITE_OTHER): Payer: Medicare HMO | Admitting: Adult Health

## 2020-03-18 ENCOUNTER — Encounter: Payer: Self-pay | Admitting: Adult Health

## 2020-03-18 ENCOUNTER — Other Ambulatory Visit: Payer: Self-pay

## 2020-03-18 VITALS — BP 153/67 | HR 85 | Temp 97.6°F | Resp 16 | Ht 71.0 in | Wt 236.0 lb

## 2020-03-18 DIAGNOSIS — R600 Localized edema: Secondary | ICD-10-CM

## 2020-03-18 DIAGNOSIS — R0600 Dyspnea, unspecified: Secondary | ICD-10-CM

## 2020-03-18 DIAGNOSIS — E1165 Type 2 diabetes mellitus with hyperglycemia: Secondary | ICD-10-CM | POA: Diagnosis not present

## 2020-03-18 DIAGNOSIS — E782 Mixed hyperlipidemia: Secondary | ICD-10-CM | POA: Diagnosis not present

## 2020-03-18 DIAGNOSIS — K219 Gastro-esophageal reflux disease without esophagitis: Secondary | ICD-10-CM

## 2020-03-18 DIAGNOSIS — I10 Essential (primary) hypertension: Secondary | ICD-10-CM

## 2020-03-18 LAB — POCT GLYCOSYLATED HEMOGLOBIN (HGB A1C): Hemoglobin A1C: 6.7 % — AB (ref 4.0–5.6)

## 2020-03-18 NOTE — Progress Notes (Signed)
[facility], [place] [address]  Internal MEDICINE  Office Visit Note  Patient Name: [name]  [id number]  [id number]  Date of Service: 03/18/2020  Chief Complaint  Patient presents with  . Follow-up  . Diabetes  . Gastroesophageal Reflux  . Hyperlipidemia  . Hypertension    HPI  Pt is here for follow up on GERD, DM, HTN, and HLD.  He reports his blood sugars have been well controlled.  He continues to use his Dexcom G6 for close control of his sugar. His A1C was 6.7 today. He denies any issues at this time. His BP is stable at 153/67.  Denies Chest pain, Shortness of breath, palpitations, headache, or blurred vision. He does report some increased bilateral feet swelling, and some intermittent DOE.      Current Medication: Outpatient Encounter Medications as of 03/18/2020  Medication Sig  . amoxicillin (AMOXIL) 500 MG tablet Take 2,000 mg by mouth as needed (Prior to dental procedures).  Marland Kitchen aspirin EC 81 MG tablet Take 81 mg by mouth daily.  Marland Kitchen atorvastatin (LIPITOR) 40 MG tablet TAKE 1 TABLET BY MOUTH EVERY DAY  . benazepril (LOTENSIN) 40 MG tablet TAKE 1 TABLET BY MOUTH EVERY DAY  . Cholecalciferol 1000 units capsule Take 1,000 Units by mouth daily.  . Cinnamon 500 MG TABS Take 1,000 mg by mouth daily.  . Glucosamine-Chondroitin 500-400 MG CAPS Take 3 tablets by mouth daily.  Marland Kitchen HUMULIN R 100 UNIT/ML injection Inject 0.15-0.17 mLs (15-17 Units total) into the skin 2 (two) times daily before a meal. And per sliding scale Pt is not to get novolin at all (Patient taking differently: Inject 15-17 Units into the skin 2 (two) times daily before a meal. 16 units in the morning and per sliding scale Pt is not to get novolin at all)  . hydrochlorothiazide (HYDRODIURIL) 25 MG tablet TAKE 1 TABLET BY MOUTH EVERY DAY IN THE MORNING  . Insulin Glargine (LANTUS SOLOSTAR) 100 UNIT/ML Solostar Pen Inject 65 Units into the skin daily at 10 pm.  . insulin  glargine (LANTUS) 100 UNIT/ML injection Inject 0.56 mLs (56 Units total) into the skin daily with breakfast.  . insulin regular (HUMULIN R) 100 units/mL injection USE 17 UNITS SUBCUTANEOUSLY TWICE DAILY WITH MEALS AND USE A SLIDING SCALE IF SUGARS GET HIGH  . [place] Ginseng 1000 MG TABS Take 1,000 mg by mouth daily.  . Omega 3 1200 MG CAPS Take 1,200 mg by mouth daily.  . ONE TOUCH ULTRA TEST test strip CHECK SUGAR 5 X A DAY FOR DX 250.3 & FLUCTUATING SUGAR  . pantoprazole (PROTONIX) 40 MG tablet TAKE 1 TABLET BY MOUTH EVERY DAY  . pyridOXINE (VITAMIN B-6) 100 MG tablet Take 100 mg by mouth daily.  Marland Kitchen thyroid (ARMOUR) 120 MG tablet Take 120 mg by mouth daily before breakfast.  . zinc gluconate 50 MG tablet Take 50 mg by mouth daily.   No facility-administered encounter medications on file as of 03/18/2020.    Surgical History: Past Surgical History:  Procedure Laterality Date  . CARPAL TUNNEL RELEASE Right 1980's  . CARPAL TUNNEL RELEASE Left 05/05/2016   Procedure: CARPAL TUNNEL RELEASE;  Surgeon: Hessie Knows, MD;  Location: [facility];  Service: Orthopedics;  Laterality: Left;  . CATARACT EXTRACTION W/PHACO Left 05/12/2015   Procedure: CATARACT EXTRACTION PHACO AND INTRAOCULAR LENS PLACEMENT (IOC);  Surgeon: Birder Robson, MD;  Location: [facility];  Service: Ophthalmology;  Laterality: Left;  [place] 01:53.1AP% 21.4CDE 24.14fluid pack  lot # [id number]  . CATARACT EXTRACTION W/PHACO Right 07/02/2019   Procedure: CATARACT EXTRACTION PHACO AND INTRAOCULAR LENS PLACEMENT (IOC) RIGHT DIABETIC  01:53.2  18.9%  21.43;  Surgeon: Galen Manila, MD;  Location: [facility];  Service: Ophthalmology;  Laterality: Right;  Diabetic - insulin  . EYE SURGERY Left    cataract extractions  . INCISION AND DRAINAGE OF WOUND Left 1988   "staph infection" left leg  . INTRAMEDULLARY (IM) NAIL INTERTROCHANTERIC Right 01/22/2018   Procedure: INTRAMEDULLARY (IM) NAIL INTERTROCHANTRIC;  Surgeon: Juanell Fairly,  MD;  Location: [facility];  Service: Orthopedics;  Laterality: Right;  . KNEE ARTHROPLASTY Left 12/28/2015   Procedure: COMPUTER ASSISTED TOTAL KNEE ARTHROPLASTY;  Surgeon: Donato Heinz, MD;  Location: [facility];  Service: Orthopedics;  Laterality: Left;  . ORIF WRIST FRACTURE Left 05/05/2016   Procedure: OPEN REDUCTION INTERNAL FIXATION (ORIF) WRIST FRACTURE;  Surgeon: Kennedy Bucker, MD;  Location: [facility];  Service: Orthopedics;  Laterality: Left;    Medical History: Past Medical History:  Diagnosis Date  . Agent orange exposure    "[name]"  . Arthritis    "left knee" (03/18/2014)  . Closed pelvic fracture (HCC) 03/18/2014   "multiple S/P fall w/loss of consciousness; CBG 29"  . Encephalopathy acute 08/16/2013  . GERD (gastroesophageal reflux disease)   . High cholesterol   . History of use of hearing aid in both ears    does not currently wear  . Hypertension   . Hypothyroidism   . Type II diabetes mellitus (HCC)     Family History: Family History  Problem Relation Age of Onset  . Colon cancer Mother   . Cerebral aneurysm Father   . Prostate cancer Neg Hx   . Bladder Cancer Neg Hx   . Kidney cancer Neg Hx     Social History   Socioeconomic History  . Marital status: Married    Spouse name: Not on file  . Number of children: Not on file  . Years of education: Not on file  . Highest education level: Not on file  Occupational History  . Not on file  Tobacco Use  . Smoking status: Former Smoker    Packs/day: 3.00    Years: 30.00    Pack years: 90.00    Types: Cigarettes    Quit date: 1988    Years since quitting: 33.4  . Smokeless tobacco: Never Used  . Tobacco comment: "quit smoking in 1988"  Substance and Sexual Activity  . Alcohol use: No  . Drug use: No  . Sexual activity: Never  Other Topics Concern  . Not on file  Social History Narrative  . Not on file   Social Determinants of Health   Financial Resource Strain:   . Difficulty of Paying Living  Expenses:   Food Insecurity:   . Worried About Programme researcher, broadcasting/film/video in the Last Year:   . Barista in the Last Year:   Transportation Needs:   . Freight forwarder (Medical):   Marland Kitchen Lack of Transportation (Non-Medical):   Physical Activity:   . Days of Exercise per Week:   . Minutes of Exercise per Session:   Stress:   . Feeling of Stress :   Social Connections:   . Frequency of Communication with Friends and Family:   . Frequency of Social Gatherings with Friends and Family:   . Attends Religious Services:   . Active Member of Clubs or Organizations:   . Attends  Club or Organization Meetings:   Marland Kitchen Marital Status:   Intimate Partner Violence:   . Fear of Current or Ex-Partner:   . Emotionally Abused:   Marland Kitchen Physically Abused:   . Sexually Abused:       Review of Systems  Constitutional: Negative.  Negative for chills, fatigue and unexpected weight change.  HENT: Negative.  Negative for congestion, rhinorrhea, sneezing and sore throat.   Eyes: Negative for redness.  Respiratory: Negative.  Negative for cough, chest tightness and shortness of breath.        DOE  Cardiovascular: Negative.  Negative for chest pain and palpitations.  Gastrointestinal: Negative.  Negative for abdominal pain, constipation, diarrhea, nausea and vomiting.  Endocrine: Negative.   Genitourinary: Negative.  Negative for dysuria and frequency.  Musculoskeletal: Negative.  Negative for arthralgias, back pain, joint swelling and neck pain.  Skin: Negative.  Negative for rash.  Allergic/Immunologic: Negative.   Neurological: Negative.  Negative for tremors and numbness.  Hematological: Negative for adenopathy. Does not bruise/bleed easily.  Psychiatric/Behavioral: Negative.  Negative for behavioral problems, sleep disturbance and suicidal ideas. The patient is not nervous/anxious.     Vital Signs: BP (!) 153/67   Pulse 85   Temp 97.6 F (36.4 C)   Resp 16   Ht 5\' 11"  (1.803 m)   Wt 236 lb (107  kg)   SpO2 93%   BMI 32.92 kg/m    Physical Exam Vitals and nursing note reviewed.  Constitutional:      General: He is not in acute distress.    Appearance: He is well-developed. He is not diaphoretic.  HENT:     Head: Normocephalic and atraumatic.     Mouth/Throat:     Pharynx: No oropharyngeal exudate.  Eyes:     Pupils: Pupils are equal, round, and reactive to light.  Neck:     Thyroid: No thyromegaly.     Vascular: No JVD.     Trachea: No tracheal deviation.  Cardiovascular:     Rate and Rhythm: Normal rate and regular rhythm.     Heart sounds: Normal heart sounds. No murmur. No friction rub. No gallop.   Pulmonary:     Effort: Pulmonary effort is normal. No respiratory distress.     Breath sounds: Normal breath sounds. No wheezing or rales.  Chest:     Chest wall: No tenderness.  Abdominal:     Palpations: Abdomen is soft.     Tenderness: There is no abdominal tenderness. There is no guarding.  Musculoskeletal:        General: Normal range of motion.     Cervical back: Normal range of motion and neck supple.  Lymphadenopathy:     Cervical: No cervical adenopathy.  Skin:    General: Skin is warm and dry.  Neurological:     Mental Status: He is alert and oriented to person, place, and time.     Cranial Nerves: No cranial nerve deficit.  Psychiatric:        Behavior: Behavior normal.        Thought Content: Thought content normal.        Judgment: Judgment normal.    Assessment/Plan: 1. Type 2 diabetes mellitus with hyperglycemia, unspecified whether long term insulin use (HCC) Well controlled, continue current management. - POCT HgB A1C  2. Localized edema Have Echo to asses heart function. - ECHOCARDIOGRAM COMPLETE; Future  3. Dyspnea, unspecified type - ECHOCARDIOGRAM COMPLETE; Future  4. Essential hypertension continue to monitor.  5. Gastroesophageal reflux disease without esophagitis Stable, continue current management.   6. Mixed  hyperlipidemia Continue lipitor as prescribed.   General Counseling: Joaquim Nam understanding of the findings of todays visit and agrees with plan of treatment. I have discussed any further diagnostic evaluation that may be needed or ordered today. We also reviewed his medications today. he has been encouraged to call the office with any questions or concerns that should arise related to todays visit.    Orders Placed This Encounter  Procedures  . POCT HgB A1C    No orders of the defined types were placed in this encounter.   Time spent: 30 Minutes   This patient was seen by Blima Ledger AGNP-C in Collaboration with Dr Lyndon Code as a part of collaborative care agreement     Johnna Acosta AGNP-C Internal medicine

## 2020-04-01 DIAGNOSIS — H43813 Vitreous degeneration, bilateral: Secondary | ICD-10-CM | POA: Diagnosis not present

## 2020-04-02 DIAGNOSIS — R69 Illness, unspecified: Secondary | ICD-10-CM | POA: Diagnosis not present

## 2020-04-03 ENCOUNTER — Other Ambulatory Visit: Payer: Self-pay

## 2020-04-03 ENCOUNTER — Ambulatory Visit: Payer: Medicare HMO

## 2020-04-03 DIAGNOSIS — R600 Localized edema: Secondary | ICD-10-CM | POA: Diagnosis not present

## 2020-04-03 DIAGNOSIS — R0600 Dyspnea, unspecified: Secondary | ICD-10-CM

## 2020-04-03 DIAGNOSIS — R0602 Shortness of breath: Secondary | ICD-10-CM | POA: Diagnosis not present

## 2020-04-10 ENCOUNTER — Telehealth: Payer: Self-pay

## 2020-04-10 NOTE — Telephone Encounter (Signed)
Confirmed appointment on 04/14/2020 and screened for covid. klh  

## 2020-04-14 ENCOUNTER — Ambulatory Visit (INDEPENDENT_AMBULATORY_CARE_PROVIDER_SITE_OTHER): Payer: Medicare HMO | Admitting: Adult Health

## 2020-04-14 ENCOUNTER — Other Ambulatory Visit: Payer: Self-pay

## 2020-04-14 ENCOUNTER — Encounter: Payer: Self-pay | Admitting: Adult Health

## 2020-04-14 VITALS — BP 138/70 | HR 67 | Temp 98.1°F | Resp 16 | Ht 71.0 in | Wt 232.6 lb

## 2020-04-14 DIAGNOSIS — I517 Cardiomegaly: Secondary | ICD-10-CM

## 2020-04-14 DIAGNOSIS — I10 Essential (primary) hypertension: Secondary | ICD-10-CM

## 2020-04-14 DIAGNOSIS — E1165 Type 2 diabetes mellitus with hyperglycemia: Secondary | ICD-10-CM | POA: Diagnosis not present

## 2020-04-14 DIAGNOSIS — K219 Gastro-esophageal reflux disease without esophagitis: Secondary | ICD-10-CM | POA: Diagnosis not present

## 2020-04-14 DIAGNOSIS — I5189 Other ill-defined heart diseases: Secondary | ICD-10-CM | POA: Diagnosis not present

## 2020-04-14 NOTE — Progress Notes (Signed)
[facility] [address] [place], [place] [address]  Internal MEDICINE  Office Visit Note  Patient Name: [name]  [id number]  [id number]  Date of Service: 04/14/2020  Chief Complaint  Patient presents with  . Follow-up    review echo  . Diabetes  . Gastroesophageal Reflux  . Hyperlipidemia  . Hypertension    HPI  Pt is here for follow up on Echo results, DM, GERD, HTN and HLD.  He reports he is doing well at this time.  His DM is well controlled.  His GERD and HLD is well controlled.  His Blood pressure is controlled.  Denies Chest pain, Shortness of breath, palpitations, headache, or blurred vision. His echo shows some diastolic dysfunction, left atrial enlargement, and Trace TR and PR.  His GERD and BP are currently well controlled.     Current Medication: Outpatient Encounter Medications as of 04/14/2020  Medication Sig  . amoxicillin (AMOXIL) 500 MG tablet Take 2,000 mg by mouth as needed (Prior to dental procedures).  Marland Kitchen aspirin EC 81 MG tablet Take 81 mg by mouth daily.  Marland Kitchen atorvastatin (LIPITOR) 40 MG tablet TAKE 1 TABLET BY MOUTH EVERY DAY  . benazepril (LOTENSIN) 40 MG tablet TAKE 1 TABLET BY MOUTH EVERY DAY  . Cholecalciferol 1000 units capsule Take 1,000 Units by mouth daily.  . Cinnamon 500 MG TABS Take 1,000 mg by mouth daily.  . Glucosamine-Chondroitin 500-400 MG CAPS Take 3 tablets by mouth daily.  Marland Kitchen HUMULIN R 100 UNIT/ML injection Inject 0.15-0.17 mLs (15-17 Units total) into the skin 2 (two) times daily before a meal. And per sliding scale Pt is not to get novolin at all (Patient taking differently: Inject 15-17 Units into the skin 2 (two) times daily before a meal. 16 units in the morning and per sliding scale Pt is not to get novolin at all)  . hydrochlorothiazide (HYDRODIURIL) 25 MG tablet TAKE 1 TABLET BY MOUTH EVERY DAY IN THE MORNING  . Insulin Glargine (LANTUS SOLOSTAR) 100 UNIT/ML Solostar Pen Inject 65 Units into the skin daily at 10  pm.  . insulin glargine (LANTUS) 100 UNIT/ML injection Inject 0.56 mLs (56 Units total) into the skin daily with breakfast.  . insulin regular (HUMULIN R) 100 units/mL injection USE 17 UNITS SUBCUTANEOUSLY TWICE DAILY WITH MEALS AND USE A SLIDING SCALE IF SUGARS GET HIGH  . [place] Ginseng 1000 MG TABS Take 1,000 mg by mouth daily.  . Omega 3 1200 MG CAPS Take 1,200 mg by mouth daily.  . ONE TOUCH ULTRA TEST test strip CHECK SUGAR 5 X A DAY FOR DX 250.3 & FLUCTUATING SUGAR  . pantoprazole (PROTONIX) 40 MG tablet TAKE 1 TABLET BY MOUTH EVERY DAY  . pyridOXINE (VITAMIN B-6) 100 MG tablet Take 100 mg by mouth daily.  Marland Kitchen thyroid (ARMOUR) 120 MG tablet Take 120 mg by mouth daily before breakfast.  . zinc gluconate 50 MG tablet Take 50 mg by mouth daily.   No facility-administered encounter medications on file as of 04/14/2020.    Surgical History: Past Surgical History:  Procedure Laterality Date  . CARPAL TUNNEL RELEASE Right 1980's  . CARPAL TUNNEL RELEASE Left 05/05/2016   Procedure: CARPAL TUNNEL RELEASE;  Surgeon: Kennedy Bucker, MD;  Location: [facility];  Service: Orthopedics;  Laterality: Left;  . CATARACT EXTRACTION W/PHACO Left 05/12/2015   Procedure: CATARACT EXTRACTION PHACO AND INTRAOCULAR LENS PLACEMENT (IOC);  Surgeon: Galen Manila, MD;  Location: [facility];  Service: Ophthalmology;  Laterality: Left;  [place] 01:53.1AP% 21.4CDE 24.34fluid pack lot # [id number]  . CATARACT EXTRACTION W/PHACO Right 07/02/2019   Procedure: CATARACT EXTRACTION PHACO AND INTRAOCULAR LENS PLACEMENT (IOC) RIGHT DIABETIC  01:53.2  18.9%  21.43;  Surgeon: Galen Manila, MD;  Location: [facility];  Service: Ophthalmology;  Laterality: Right;  Diabetic - insulin  . EYE SURGERY Left    cataract extractions  . INCISION AND DRAINAGE OF WOUND Left 1988   "staph infection" left leg  . INTRAMEDULLARY (IM) NAIL INTERTROCHANTERIC Right 01/22/2018   Procedure: INTRAMEDULLARY (IM) NAIL INTERTROCHANTRIC;  Surgeon:  Juanell Fairly, MD;  Location: [facility];  Service: Orthopedics;  Laterality: Right;  . KNEE ARTHROPLASTY Left 12/28/2015   Procedure: COMPUTER ASSISTED TOTAL KNEE ARTHROPLASTY;  Surgeon: Donato Heinz, MD;  Location: [facility];  Service: Orthopedics;  Laterality: Left;  . ORIF WRIST FRACTURE Left 05/05/2016   Procedure: OPEN REDUCTION INTERNAL FIXATION (ORIF) WRIST FRACTURE;  Surgeon: Kennedy Bucker, MD;  Location: [facility];  Service: Orthopedics;  Laterality: Left;    Medical History: Past Medical History:  Diagnosis Date  . Agent orange exposure    "[name]"  . Arthritis    "left knee" (03/18/2014)  . Closed pelvic fracture (HCC) 03/18/2014   "multiple S/P fall w/loss of consciousness; CBG 29"  . Encephalopathy acute 08/16/2013  . GERD (gastroesophageal reflux disease)   . High cholesterol   . History of use of hearing aid in both ears    does not currently wear  . Hypertension   . Hypothyroidism   . Type II diabetes mellitus (HCC)     Family History: Family History  Problem Relation Age of Onset  . Colon cancer Mother   . Cerebral aneurysm Father   . Prostate cancer Neg Hx   . Bladder Cancer Neg Hx   . Kidney cancer Neg Hx     Social History   Socioeconomic History  . Marital status: Married    Spouse name: Not on file  . Number of children: Not on file  . Years of education: Not on file  . Highest education level: Not on file  Occupational History  . Not on file  Tobacco Use  . Smoking status: Former Smoker    Packs/day: 3.00    Years: 30.00    Pack years: 90.00    Types: Cigarettes    Quit date: 1988    Years since quitting: 33.4  . Smokeless tobacco: Never Used  . Tobacco comment: "quit smoking in 1988"  Vaping Use  . Vaping Use: Never used  Substance and Sexual Activity  . Alcohol use: No  . Drug use: No  . Sexual activity: Never  Other Topics Concern  . Not on file  Social History Narrative  . Not on file   Social Determinants of Health    Financial Resource Strain:   . Difficulty of Paying Living Expenses:   Food Insecurity:   . Worried About Programme researcher, broadcasting/film/video in the Last Year:   . Barista in the Last Year:   Transportation Needs:   . Freight forwarder (Medical):   Marland Kitchen Lack of Transportation (Non-Medical):   Physical Activity:   . Days of Exercise per Week:   . Minutes of Exercise per Session:   Stress:   . Feeling of Stress :   Social Connections:   . Frequency of Communication with Friends and Family:   . Frequency of Social Gatherings with Friends and Family:   . Attends Religious  Services:   . Active Member of Clubs or Organizations:   . Attends Archivist Meetings:   Marland Kitchen Marital Status:   Intimate Partner Violence:   . Fear of Current or Ex-Partner:   . Emotionally Abused:   Marland Kitchen Physically Abused:   . Sexually Abused:       Review of Systems  Constitutional: Negative.  Negative for chills, fatigue and unexpected weight change.  HENT: Negative.  Negative for congestion, rhinorrhea, sneezing and sore throat.   Eyes: Negative for redness.  Respiratory: Negative.  Negative for cough, chest tightness and shortness of breath.   Cardiovascular: Negative.  Negative for chest pain and palpitations.  Gastrointestinal: Negative.  Negative for abdominal pain, constipation, diarrhea, nausea and vomiting.  Endocrine: Negative.   Genitourinary: Negative.  Negative for dysuria and frequency.  Musculoskeletal: Negative.  Negative for arthralgias, back pain, joint swelling and neck pain.  Skin: Negative.  Negative for rash.  Allergic/Immunologic: Negative.   Neurological: Negative.  Negative for tremors and numbness.  Hematological: Negative for adenopathy. Does not bruise/bleed easily.  Psychiatric/Behavioral: Negative.  Negative for behavioral problems, sleep disturbance and suicidal ideas. The patient is not nervous/anxious.     Vital Signs: BP (!) 154/70   Pulse 67   Temp 98.1 F (36.7  C)   Resp 16   Ht 5\' 11"  (1.803 m)   Wt 232 lb 9.6 oz (105.5 kg)   SpO2 94%   BMI 32.44 kg/m    Physical Exam Vitals and nursing note reviewed.  Constitutional:      General: He is not in acute distress.    Appearance: He is well-developed. He is not diaphoretic.  HENT:     Head: Normocephalic and atraumatic.     Mouth/Throat:     Pharynx: No oropharyngeal exudate.  Eyes:     Pupils: Pupils are equal, round, and reactive to light.  Neck:     Thyroid: No thyromegaly.     Vascular: No JVD.     Trachea: No tracheal deviation.  Cardiovascular:     Rate and Rhythm: Normal rate and regular rhythm.     Heart sounds: Normal heart sounds. No murmur heard.  No friction rub. No gallop.   Pulmonary:     Effort: Pulmonary effort is normal. No respiratory distress.     Breath sounds: Normal breath sounds. No wheezing or rales.  Chest:     Chest wall: No tenderness.  Abdominal:     Palpations: Abdomen is soft.     Tenderness: There is no abdominal tenderness. There is no guarding.  Musculoskeletal:        General: Normal range of motion.     Cervical back: Normal range of motion and neck supple.  Lymphadenopathy:     Cervical: No cervical adenopathy.  Skin:    General: Skin is warm and dry.  Neurological:     Mental Status: He is alert and oriented to person, place, and time.     Cranial Nerves: No cranial nerve deficit.  Psychiatric:        Behavior: Behavior normal.        Thought Content: Thought content normal.        Judgment: Judgment normal.    Assessment/Plan: 1. Type 2 diabetes mellitus with hyperglycemia, unspecified whether long term insulin use ([place]) Continue current management. Well controlled at this time.   2. Essential hypertension Stable, continue current medications.   3. Gastroesophageal reflux disease without esophagitis No complaints or symptoms.  Continue to follow.   4. Diastolic dysfunction Discussed possibility of sleep study.  Discussed sleep  apnea, and patient does not wish to have sleep study at this time.    5. LAE (left atrial enlargement) We discussed OSA as above, also discussed importance of managing BP. Echo in 2014 showed LAE, remains mild at this time. Continue to monitor, patient is agreeable to plan.  General Counseling: [name] understanding of the findings of todays visit and agrees with plan of treatment. I have discussed any further diagnostic evaluation that may be needed or ordered today. We also reviewed his medications today. he has been encouraged to call the office with any questions or concerns that should arise related to todays visit.    No orders of the defined types were placed in this encounter.   No orders of the defined types were placed in this encounter.   Time spent: 30 Minutes   This patient was seen by Blima Ledger AGNP-C in Collaboration with Dr Lyndon Code as a part of collaborative care agreement     Johnna Acosta AGNP-C Internal medicine

## 2020-05-22 ENCOUNTER — Telehealth: Payer: Self-pay

## 2020-05-22 NOTE — Telephone Encounter (Signed)
Tried contacting patient to inform of appointment on 05/26/2020 no voicemail. klh

## 2020-05-25 ENCOUNTER — Telehealth: Payer: Self-pay

## 2020-05-25 NOTE — Telephone Encounter (Signed)
Confirmed appointment on 05/26/2020 and screened for covid. klh  

## 2020-05-26 ENCOUNTER — Encounter: Payer: Self-pay | Admitting: Adult Health

## 2020-05-26 ENCOUNTER — Ambulatory Visit (INDEPENDENT_AMBULATORY_CARE_PROVIDER_SITE_OTHER): Payer: Medicare HMO | Admitting: Adult Health

## 2020-05-26 ENCOUNTER — Other Ambulatory Visit: Payer: Self-pay

## 2020-05-26 VITALS — BP 148/64 | HR 52 | Temp 98.0°F | Resp 16 | Ht 71.0 in | Wt 236.6 lb

## 2020-05-26 DIAGNOSIS — R300 Dysuria: Secondary | ICD-10-CM

## 2020-05-26 DIAGNOSIS — Z0001 Encounter for general adult medical examination with abnormal findings: Secondary | ICD-10-CM

## 2020-05-26 DIAGNOSIS — E782 Mixed hyperlipidemia: Secondary | ICD-10-CM | POA: Diagnosis not present

## 2020-05-26 DIAGNOSIS — K219 Gastro-esophageal reflux disease without esophagitis: Secondary | ICD-10-CM

## 2020-05-26 DIAGNOSIS — E1165 Type 2 diabetes mellitus with hyperglycemia: Secondary | ICD-10-CM

## 2020-05-26 DIAGNOSIS — Z6833 Body mass index (BMI) 33.0-33.9, adult: Secondary | ICD-10-CM | POA: Diagnosis not present

## 2020-05-26 DIAGNOSIS — I10 Essential (primary) hypertension: Secondary | ICD-10-CM | POA: Diagnosis not present

## 2020-05-26 DIAGNOSIS — Z79899 Other long term (current) drug therapy: Secondary | ICD-10-CM | POA: Diagnosis not present

## 2020-05-26 NOTE — Progress Notes (Signed)
[facility] [address] [place], [place] [address]  Internal MEDICINE  Office Visit Note  Patient Name: [name]  [id number]  [id number]  Date of Service: 05/26/2020  Chief Complaint  Patient presents with  . Medicare Wellness  . Diabetes  . Hyperlipidemia  . Hypertension     HPI Pt is here for routine health maintenance examination.  Pt is well appearing 79 yo male. He has a history of DM, HTN, GERD, and HLD.   He sees endocrinolgoy at the [place].  He uses a Dexcom G6 to help manage his blood glucose.  His last A1C was 7 at the [place]. Per patient.  He denies any extreme high or low readings.  His blood pressure is slightly elevated today 148/64. Denies Chest pain, Shortness of breath, palpitations, headache, or blurred vision.  He is due for lipid panel to monitor HLD.  His gerd is well controlled currently.    Current Medication: Outpatient Encounter Medications as of 05/26/2020  Medication Sig  . amoxicillin (AMOXIL) 500 MG tablet Take 2,000 mg by mouth as needed (Prior to dental procedures).  Marland Kitchen aspirin EC 81 MG tablet Take 81 mg by mouth daily.  Marland Kitchen atorvastatin (LIPITOR) 40 MG tablet TAKE 1 TABLET BY MOUTH EVERY DAY  . benazepril (LOTENSIN) 40 MG tablet TAKE 1 TABLET BY MOUTH EVERY DAY  . Cholecalciferol 1000 units capsule Take 1,000 Units by mouth daily.  . Cinnamon 500 MG TABS Take 1,000 mg by mouth daily.  . Glucosamine-Chondroitin 500-400 MG CAPS Take 3 tablets by mouth daily.  Marland Kitchen HUMULIN R 100 UNIT/ML injection Inject 0.15-0.17 mLs (15-17 Units total) into the skin 2 (two) times daily before a meal. And per sliding scale Pt is not to get novolin at all (Patient taking differently: Inject 15-17 Units into the skin 2 (two) times daily before a meal. 16 units in the morning and per sliding scale Pt is not to get novolin at all)  . hydrochlorothiazide (HYDRODIURIL) 25 MG tablet TAKE 1 TABLET BY MOUTH EVERY DAY IN THE MORNING  . Insulin Glargine (LANTUS SOLOSTAR)  100 UNIT/ML Solostar Pen Inject 65 Units into the skin daily at 10 pm.  . insulin glargine (LANTUS) 100 UNIT/ML injection Inject 0.56 mLs (56 Units total) into the skin daily with breakfast.  . insulin regular (HUMULIN R) 100 units/mL injection USE 17 UNITS SUBCUTANEOUSLY TWICE DAILY WITH MEALS AND USE A SLIDING SCALE IF SUGARS GET HIGH  . [place] Ginseng 1000 MG TABS Take 1,000 mg by mouth daily.  . Omega 3 1200 MG CAPS Take 1,200 mg by mouth daily.  . ONE TOUCH ULTRA TEST test strip CHECK SUGAR 5 X A DAY FOR DX 250.3 & FLUCTUATING SUGAR  . pantoprazole (PROTONIX) 40 MG tablet TAKE 1 TABLET BY MOUTH EVERY DAY  . pyridOXINE (VITAMIN B-6) 100 MG tablet Take 100 mg by mouth daily.  Marland Kitchen thyroid (ARMOUR) 120 MG tablet Take 120 mg by mouth daily before breakfast.  . zinc gluconate 50 MG tablet Take 50 mg by mouth daily.   No facility-administered encounter medications on file as of 05/26/2020.    Surgical History: Past Surgical History:  Procedure Laterality Date  . CARPAL TUNNEL RELEASE Right 1980's  . CARPAL TUNNEL RELEASE Left 05/05/2016   Procedure: CARPAL TUNNEL RELEASE;  Surgeon: Kennedy Bucker, MD;  Location: [facility];  Service: Orthopedics;  Laterality: Left;  . CATARACT EXTRACTION W/PHACO Left 05/12/2015   Procedure: CATARACT EXTRACTION PHACO AND INTRAOCULAR LENS PLACEMENT (IOC);  Surgeon: Galen Manila, MD;  Location: [facility];  Service: Ophthalmology;  Laterality: Left;  [place] 01:53.1AP% 21.4CDE 24.71fluid pack lot # [id number]  . CATARACT EXTRACTION W/PHACO Right 07/02/2019   Procedure: CATARACT EXTRACTION PHACO AND INTRAOCULAR LENS PLACEMENT (IOC) RIGHT DIABETIC  01:53.2  18.9%  21.43;  Surgeon: Galen Manila, MD;  Location: [facility];  Service: Ophthalmology;  Laterality: Right;  Diabetic - insulin  . EYE SURGERY Left    cataract extractions  . INCISION AND DRAINAGE OF WOUND Left 1988   "staph infection" left leg  . INTRAMEDULLARY (IM) NAIL INTERTROCHANTERIC Right 01/22/2018    Procedure: INTRAMEDULLARY (IM) NAIL INTERTROCHANTRIC;  Surgeon: Juanell Fairly, MD;  Location: [facility];  Service: Orthopedics;  Laterality: Right;  . KNEE ARTHROPLASTY Left 12/28/2015   Procedure: COMPUTER ASSISTED TOTAL KNEE ARTHROPLASTY;  Surgeon: Donato Heinz, MD;  Location: [facility];  Service: Orthopedics;  Laterality: Left;  . ORIF WRIST FRACTURE Left 05/05/2016   Procedure: OPEN REDUCTION INTERNAL FIXATION (ORIF) WRIST FRACTURE;  Surgeon: Kennedy Bucker, MD;  Location: [facility];  Service: Orthopedics;  Laterality: Left;    Medical History: Past Medical History:  Diagnosis Date  . Agent orange exposure    "[name]"  . Arthritis    "left knee" (03/18/2014)  . Closed pelvic fracture (HCC) 03/18/2014   "multiple S/P fall w/loss of consciousness; CBG 29"  . Encephalopathy acute 08/16/2013  . GERD (gastroesophageal reflux disease)   . High cholesterol   . History of use of hearing aid in both ears    does not currently wear  . Hypertension   . Hypothyroidism   . Type II diabetes mellitus (HCC)     Family History: Family History  Problem Relation Age of Onset  . Colon cancer Mother   . Cerebral aneurysm Father   . Prostate cancer Neg Hx   . Bladder Cancer Neg Hx   . Kidney cancer Neg Hx       Review of Systems  Constitutional: Negative.  Negative for chills, fatigue and unexpected weight change.  HENT: Negative.  Negative for congestion, rhinorrhea, sneezing and sore throat.   Eyes: Negative for redness.  Respiratory: Negative.  Negative for cough, chest tightness and shortness of breath.   Cardiovascular: Negative.  Negative for chest pain and palpitations.  Gastrointestinal: Negative.  Negative for abdominal pain, constipation, diarrhea, nausea and vomiting.  Endocrine: Negative.   Genitourinary: Negative.  Negative for dysuria and frequency.  Musculoskeletal: Negative.  Negative for arthralgias, back pain, joint swelling and neck pain.  Skin: Negative.  Negative for  rash.  Allergic/Immunologic: Negative.   Neurological: Negative.  Negative for tremors and numbness.  Hematological: Negative for adenopathy. Does not bruise/bleed easily.  Psychiatric/Behavioral: Negative.  Negative for behavioral problems, sleep disturbance and suicidal ideas. The patient is not nervous/anxious.      Vital Signs: BP (!) 148/64   Pulse 52   Temp 98 F (36.7 C)   Resp 16   Ht 5\' 11"  (1.803 m)   Wt (!) 236 lb 9.6 oz (107.3 kg)   SpO2 92%   BMI 33.00 kg/m    Physical Exam Vitals and nursing note reviewed.  Constitutional:      General: He is not in acute distress.    Appearance: He is well-developed. He is not diaphoretic.  HENT:     Head: Normocephalic and atraumatic.     Mouth/Throat:     Pharynx: No oropharyngeal exudate.  Eyes:     Pupils: Pupils are equal,  round, and reactive to light.  Neck:     Thyroid: No thyromegaly.     Vascular: No JVD.     Trachea: No tracheal deviation.  Cardiovascular:     Rate and Rhythm: Normal rate and regular rhythm.     Heart sounds: Normal heart sounds. No murmur heard.  No friction rub. No gallop.   Pulmonary:     Effort: Pulmonary effort is normal. No respiratory distress.     Breath sounds: Normal breath sounds. No wheezing or rales.  Chest:     Chest wall: No tenderness.  Abdominal:     Palpations: Abdomen is soft.     Tenderness: There is no abdominal tenderness. There is no guarding.  Musculoskeletal:        General: Normal range of motion.     Cervical back: Normal range of motion and neck supple.  Lymphadenopathy:     Cervical: No cervical adenopathy.  Skin:    General: Skin is warm and dry.  Neurological:     Mental Status: He is alert and oriented to person, place, and time.     Cranial Nerves: No cranial nerve deficit.  Psychiatric:        Behavior: Behavior normal.        Thought Content: Thought content normal.        Judgment: Judgment normal.      LABS: Recent Results (from the past  2160 hour(s))  POCT HgB A1C     Status: Abnormal   Collection Time: 03/18/20  9:55 AM  Result Value Ref Range   Hemoglobin A1C 6.7 (A) 4.0 - 5.6 %   HbA1c POC (<> result, manual entry)     HbA1c, POC (prediabetic range)     HbA1c, POC (controlled diabetic range)      Assessment/Plan: 1. Encounter for general adult medical examination with abnormal findings Up to date on phm. - CBC with Differential/Platelet - Lipid Panel With LDL/HDL Ratio - TSH - T4, free - Comprehensive metabolic panel  2. Type 2 diabetes mellitus with hyperglycemia, unspecified whether long term insulin use (HCC) Continue current regimen, follow endocrinology recommendations.   3. BMI 33.0-33.9,adult Obesity Counseling: Risk Assessment: An assessment of behavioral risk factors was made today and includes lack of exercise sedentary lifestyle, lack of portion control and poor dietary habits.  Risk Modification Advice: She was counseled on portion control guidelines. Restricting daily caloric intake to 1800. The detrimental long term effects of obesity on her health and ongoing poor compliance was also discussed with the patient.  4. Essential hypertension Controlled, continue current medications.   5. Gastroesophageal reflux disease without esophagitis Controlled, continue current medications.   6. Mixed hyperlipidemia Recheck lipid panel, continue Lipitor.   7. Dysuria - UA/M w/rflx Culture, Routine  8. Encounter for long-term (current) use of medications - CBC with Differential/Platelet - Lipid Panel With LDL/HDL Ratio - TSH - T4, free - Comprehensive metabolic panel  General Counseling: osvaldo lamping understanding of the findings of todays visit and agrees with plan of treatment. I have discussed any further diagnostic evaluation that may be needed or ordered today. We also reviewed his medications today. he has been encouraged to call the office with any questions or concerns that should arise  related to todays visit.   Orders Placed This Encounter  Procedures  . UA/M w/rflx Culture, Routine    No orders of the defined types were placed in this encounter.   Time spent: 30 Minutes   This patient  was seen by Orson Gear AGNP-C in Collaboration with Dr Lavera Guise as a part of collaborative care agreement    Kendell Bane AGNP-C Internal Medicine

## 2020-05-27 DIAGNOSIS — Z79899 Other long term (current) drug therapy: Secondary | ICD-10-CM | POA: Diagnosis not present

## 2020-05-27 DIAGNOSIS — Z0001 Encounter for general adult medical examination with abnormal findings: Secondary | ICD-10-CM | POA: Diagnosis not present

## 2020-05-27 LAB — UA/M W/RFLX CULTURE, ROUTINE
Bilirubin, UA: NEGATIVE
Glucose, UA: NEGATIVE
Ketones, UA: NEGATIVE
Leukocytes,UA: NEGATIVE
Nitrite, UA: NEGATIVE
Protein,UA: NEGATIVE
RBC, UA: NEGATIVE
Specific Gravity, UA: 1.014 (ref 1.005–1.030)
Urobilinogen, Ur: 0.2 mg/dL (ref 0.2–1.0)
pH, UA: 5.5 (ref 5.0–7.5)

## 2020-05-27 LAB — MICROSCOPIC EXAMINATION
Bacteria, UA: NONE SEEN
Casts: NONE SEEN /lpf
Epithelial Cells (non renal): NONE SEEN /hpf (ref 0–10)
RBC, Urine: NONE SEEN /hpf (ref 0–2)
WBC, UA: NONE SEEN /hpf (ref 0–5)

## 2020-05-28 LAB — COMPREHENSIVE METABOLIC PANEL
ALT: 20 IU/L (ref 0–44)
AST: 18 IU/L (ref 0–40)
Albumin/Globulin Ratio: 1.9 (ref 1.2–2.2)
Albumin: 4.2 g/dL (ref 3.7–4.7)
Alkaline Phosphatase: 97 IU/L (ref 48–121)
BUN/Creatinine Ratio: 15 (ref 10–24)
BUN: 15 mg/dL (ref 8–27)
Bilirubin Total: 0.5 mg/dL (ref 0.0–1.2)
CO2: 26 mmol/L (ref 20–29)
Calcium: 9.4 mg/dL (ref 8.6–10.2)
Chloride: 104 mmol/L (ref 96–106)
Creatinine, Ser: 0.98 mg/dL (ref 0.76–1.27)
GFR calc Af Amer: 84 mL/min/{1.73_m2} (ref >59)
GFR calc non Af Amer: 73 mL/min/{1.73_m2} (ref >59)
Globulin, Total: 2.2 g/dL (ref 1.5–4.5)
Glucose: 98 mg/dL (ref 65–99)
Potassium: 4.5 mmol/L (ref 3.5–5.2)
Sodium: 142 mmol/L (ref 134–144)
Total Protein: 6.4 g/dL (ref 6.0–8.5)

## 2020-05-28 LAB — T4, FREE: Free T4: 1.18 ng/dL (ref 0.82–1.77)

## 2020-05-28 LAB — CBC WITH DIFFERENTIAL/PLATELET
Basophils Absolute: 0.1 10*3/uL (ref 0.0–0.2)
Basos: 1 %
EOS (ABSOLUTE): 0.2 10*3/uL (ref 0.0–0.4)
Eos: 2 %
Hematocrit: 39.7 % (ref 37.5–51.0)
Hemoglobin: 13.5 g/dL (ref 13.0–17.7)
Immature Grans (Abs): 0.1 10*3/uL (ref 0.0–0.1)
Immature Granulocytes: 1 %
Lymphocytes Absolute: 4.5 10*3/uL — ABNORMAL HIGH (ref 0.7–3.1)
Lymphs: 57 %
MCH: 32.8 pg (ref 26.6–33.0)
MCHC: 34 g/dL (ref 31.5–35.7)
MCV: 96 fL (ref 79–97)
Monocytes Absolute: 0.7 10*3/uL (ref 0.1–0.9)
Monocytes: 9 %
Neutrophils Absolute: 2.3 10*3/uL (ref 1.4–7.0)
Neutrophils: 30 %
Platelets: 123 10*3/uL — ABNORMAL LOW (ref 150–450)
RBC: 4.12 x10E6/uL — ABNORMAL LOW (ref 4.14–5.80)
RDW: 13.8 % (ref 11.6–15.4)
WBC: 7.9 10*3/uL (ref 3.4–10.8)

## 2020-05-28 LAB — LIPID PANEL WITH LDL/HDL RATIO
Cholesterol, Total: 105 mg/dL (ref 100–199)
HDL: 41 mg/dL (ref >39)
LDL Chol Calc (NIH): 53 mg/dL (ref 0–99)
LDL/HDL Ratio: 1.3 ratio (ref 0.0–3.6)
Triglycerides: 39 mg/dL (ref 0–149)
VLDL Cholesterol Cal: 11 mg/dL (ref 5–40)

## 2020-05-28 LAB — TSH: TSH: 2.76 u[IU]/mL (ref 0.450–4.500)

## 2020-06-03 DIAGNOSIS — R69 Illness, unspecified: Secondary | ICD-10-CM | POA: Diagnosis not present

## 2020-06-05 DIAGNOSIS — E0520 Thyrotoxicosis with toxic multinodular goiter without thyrotoxic crisis or storm: Secondary | ICD-10-CM | POA: Diagnosis not present

## 2020-06-05 DIAGNOSIS — E1165 Type 2 diabetes mellitus with hyperglycemia: Secondary | ICD-10-CM | POA: Diagnosis not present

## 2020-06-05 DIAGNOSIS — E063 Autoimmune thyroiditis: Secondary | ICD-10-CM | POA: Diagnosis not present

## 2020-06-05 DIAGNOSIS — I10 Essential (primary) hypertension: Secondary | ICD-10-CM | POA: Diagnosis not present

## 2020-06-05 DIAGNOSIS — E785 Hyperlipidemia, unspecified: Secondary | ICD-10-CM | POA: Diagnosis not present

## 2020-06-09 DIAGNOSIS — R69 Illness, unspecified: Secondary | ICD-10-CM | POA: Diagnosis not present

## 2020-06-12 ENCOUNTER — Other Ambulatory Visit: Payer: Self-pay | Admitting: Internal Medicine

## 2020-06-12 ENCOUNTER — Telehealth: Payer: Self-pay

## 2020-06-12 DIAGNOSIS — Z6832 Body mass index (BMI) 32.0-32.9, adult: Secondary | ICD-10-CM | POA: Diagnosis not present

## 2020-06-12 DIAGNOSIS — E0520 Thyrotoxicosis with toxic multinodular goiter without thyrotoxic crisis or storm: Secondary | ICD-10-CM | POA: Diagnosis not present

## 2020-06-12 DIAGNOSIS — E890 Postprocedural hypothyroidism: Secondary | ICD-10-CM | POA: Diagnosis not present

## 2020-06-12 DIAGNOSIS — E1165 Type 2 diabetes mellitus with hyperglycemia: Secondary | ICD-10-CM | POA: Diagnosis not present

## 2020-06-12 DIAGNOSIS — E049 Nontoxic goiter, unspecified: Secondary | ICD-10-CM | POA: Diagnosis not present

## 2020-06-12 DIAGNOSIS — E063 Autoimmune thyroiditis: Secondary | ICD-10-CM | POA: Diagnosis not present

## 2020-06-12 DIAGNOSIS — E785 Hyperlipidemia, unspecified: Secondary | ICD-10-CM | POA: Diagnosis not present

## 2020-06-12 DIAGNOSIS — Z7189 Other specified counseling: Secondary | ICD-10-CM | POA: Diagnosis not present

## 2020-06-12 DIAGNOSIS — I10 Essential (primary) hypertension: Secondary | ICD-10-CM | POA: Diagnosis not present

## 2020-06-12 DIAGNOSIS — E6609 Other obesity due to excess calories: Secondary | ICD-10-CM | POA: Diagnosis not present

## 2020-06-12 NOTE — Telephone Encounter (Signed)
Confirmed and screened for 06-16-20 ov. 

## 2020-06-16 ENCOUNTER — Encounter: Payer: Self-pay | Admitting: Hospice and Palliative Medicine

## 2020-06-16 ENCOUNTER — Ambulatory Visit (INDEPENDENT_AMBULATORY_CARE_PROVIDER_SITE_OTHER): Payer: Medicare HMO | Admitting: Hospice and Palliative Medicine

## 2020-06-16 ENCOUNTER — Other Ambulatory Visit: Payer: Self-pay

## 2020-06-16 VITALS — BP 132/64 | HR 84 | Temp 98.1°F | Resp 16 | Ht 71.0 in | Wt 237.2 lb

## 2020-06-16 DIAGNOSIS — Z6833 Body mass index (BMI) 33.0-33.9, adult: Secondary | ICD-10-CM

## 2020-06-16 DIAGNOSIS — I6523 Occlusion and stenosis of bilateral carotid arteries: Secondary | ICD-10-CM

## 2020-06-16 DIAGNOSIS — I10 Essential (primary) hypertension: Secondary | ICD-10-CM

## 2020-06-16 DIAGNOSIS — E1165 Type 2 diabetes mellitus with hyperglycemia: Secondary | ICD-10-CM | POA: Diagnosis not present

## 2020-06-16 LAB — POCT GLYCOSYLATED HEMOGLOBIN (HGB A1C): Hemoglobin A1C: 6.4 % — AB (ref 4.0–5.6)

## 2020-06-16 NOTE — Progress Notes (Addendum)
[facility] [address] [place], [place] [address]  Internal MEDICINE  Office Visit Note  Patient Name: Tony BucyDonald R Joseph  [id number]  [id number]  Date of Service: 06/22/2020  Chief Complaint  Patient presents with  . Follow-up  . Hypertension  . Hyperlipidemia  . Diabetes  . Quality Metric Gaps    TDAP    HPI Patient is here today for routine follow-up on DM and HTN.  A1C 6.4 today, has been stable for last several months. He was able to get an implantable glucose reader from the [facility] and he feels this has helped him in being able to manage his DM. Reports having no difficulties or side effects from his current regimen on Humulin and Lantus. Pt has h/o admission to hospital with periods of hypoglycemia/TIA  BP well controlled today. Routinely checks his BP at home and reports his SBP ranges 120-130 and DBP ranges 65-75. Encouraged to continue routinely checking BP at home for monitoring. Reviewed his lab results from 7/28, all labs normals. Lipid panel well controlled on atorvastatin, reports not having any side effects from this medication.   Current Medication: Outpatient Encounter Medications as of 06/16/2020  Medication Sig  . amoxicillin (AMOXIL) 500 MG tablet Take 2,000 mg by mouth as needed (Prior to dental procedures).  Marland Kitchen. aspirin EC 81 MG tablet Take 81 mg by mouth daily.  Marland Kitchen. atorvastatin (LIPITOR) 40 MG tablet TAKE 1 TABLET BY MOUTH EVERY DAY  . benazepril (LOTENSIN) 40 MG tablet TAKE 1 TABLET BY MOUTH EVERY DAY  . Cinnamon 500 MG TABS Take 1,000 mg by mouth daily.  . Glucosamine-Chondroitin 500-400 MG CAPS Take 3 tablets by mouth daily.  Marland Kitchen. HUMULIN R 100 UNIT/ML injection Inject 0.15-0.17 mLs (15-17 Units total) into the skin 2 (two) times daily before a meal. And per sliding scale Pt is not to get novolin at all (Patient taking differently: Inject 15-17 Units into the skin 2 (two) times daily before a meal. 16 units in the morning and per sliding scale Pt is  not to get novolin at all)  . hydrochlorothiazide (HYDRODIURIL) 25 MG tablet TAKE 1 TABLET BY MOUTH EVERY DAY IN THE MORNING  . Insulin Glargine (LANTUS SOLOSTAR) 100 UNIT/ML Solostar Pen Inject 65 Units into the skin daily at 10 pm.  . insulin glargine (LANTUS) 100 UNIT/ML injection Inject 0.56 mLs (56 Units total) into the skin daily with breakfast.  . insulin regular (HUMULIN R) 100 units/mL injection USE 17 UNITS SUBCUTANEOUSLY TWICE DAILY WITH MEALS AND USE A SLIDING SCALE IF SUGARS GET HIGH  . [place] Ginseng 1000 MG TABS Take 1,000 mg by mouth daily.  . Omega 3 1200 MG CAPS Take 1,200 mg by mouth daily.  . ONE TOUCH ULTRA TEST test strip CHECK SUGAR 5 X A DAY FOR DX 250.3 & FLUCTUATING SUGAR  . pantoprazole (PROTONIX) 40 MG tablet TAKE 1 TABLET BY MOUTH EVERY DAY  . pyridOXINE (VITAMIN B-6) 100 MG tablet Take 100 mg by mouth daily.  Marland Kitchen. thyroid (ARMOUR) 120 MG tablet Take 120 mg by mouth daily before breakfast.  . zinc gluconate 50 MG tablet Take 50 mg by mouth daily.  . Cholecalciferol 1000 units capsule Take 1,000 Units by mouth daily. (Patient not taking: Reported on 06/16/2020)   No facility-administered encounter medications on file as of 06/16/2020.    Surgical History: Past Surgical History:  Procedure Laterality Date  . CARPAL TUNNEL RELEASE Right 1980's  . CARPAL TUNNEL RELEASE Left 05/05/2016   Procedure:  CARPAL TUNNEL RELEASE;  Surgeon: Kennedy Bucker, MD;  Location: [facility];  Service: Orthopedics;  Laterality: Left;  . CATARACT EXTRACTION W/PHACO Left 05/12/2015   Procedure: CATARACT EXTRACTION PHACO AND INTRAOCULAR LENS PLACEMENT (IOC);  Surgeon: Galen Manila, MD;  Location: [facility];  Service: Ophthalmology;  Laterality: Left;  [place] 01:53.1AP% 21.4CDE 24.11fluid pack lot # [id number]  . CATARACT EXTRACTION W/PHACO Right 07/02/2019   Procedure: CATARACT EXTRACTION PHACO AND INTRAOCULAR LENS PLACEMENT (IOC) RIGHT DIABETIC  01:53.2  18.9%  21.43;  Surgeon: Galen Manila, MD;   Location: [facility];  Service: Ophthalmology;  Laterality: Right;  Diabetic - insulin  . EYE SURGERY Left    cataract extractions  . INCISION AND DRAINAGE OF WOUND Left 1988   "staph infection" left leg  . INTRAMEDULLARY (IM) NAIL INTERTROCHANTERIC Right 01/22/2018   Procedure: INTRAMEDULLARY (IM) NAIL INTERTROCHANTRIC;  Surgeon: Juanell Fairly, MD;  Location: [facility];  Service: Orthopedics;  Laterality: Right;  . KNEE ARTHROPLASTY Left 12/28/2015   Procedure: COMPUTER ASSISTED TOTAL KNEE ARTHROPLASTY;  Surgeon: Donato Heinz, MD;  Location: [facility];  Service: Orthopedics;  Laterality: Left;  . ORIF WRIST FRACTURE Left 05/05/2016   Procedure: OPEN REDUCTION INTERNAL FIXATION (ORIF) WRIST FRACTURE;  Surgeon: Kennedy Bucker, MD;  Location: [facility];  Service: Orthopedics;  Laterality: Left;    Medical History: Past Medical History:  Diagnosis Date  . Agent orange exposure    "[name]"  . Arthritis    "left knee" (03/18/2014)  . Closed pelvic fracture (HCC) 03/18/2014   "multiple S/P fall w/loss of consciousness; CBG 29"  . Encephalopathy acute 08/16/2013  . GERD (gastroesophageal reflux disease)   . High cholesterol   . History of use of hearing aid in both ears    does not currently wear  . Hypertension   . Hypothyroidism   . Type II diabetes mellitus (HCC)     Family History: Family History  Problem Relation Age of Onset  . Colon cancer Mother   . Cerebral aneurysm Father   . Prostate cancer Neg Hx   . Bladder Cancer Neg Hx   . Kidney cancer Neg Hx     Social History   Socioeconomic History  . Marital status: Married    Spouse name: Not on file  . Number of children: Not on file  . Years of education: Not on file  . Highest education level: Not on file  Occupational History  . Not on file  Tobacco Use  . Smoking status: Former Smoker    Packs/day: 3.00    Years: 30.00    Pack years: 90.00    Types: Cigarettes    Quit date: 1988    Years since  quitting: 33.6  . Smokeless tobacco: Never Used  . Tobacco comment: "quit smoking in 1988"  Vaping Use  . Vaping Use: Never used  Substance and Sexual Activity  . Alcohol use: No  . Drug use: No  . Sexual activity: Never  Other Topics Concern  . Not on file  Social History Narrative  . Not on file   Social Determinants of Health   Financial Resource Strain:   . Difficulty of Paying Living Expenses: Not on file  Food Insecurity:   . Worried About Programme researcher, broadcasting/film/video in the Last Year: Not on file  . Ran Out of Food in the Last Year: Not on file  Transportation Needs:   . Lack of Transportation (Medical): Not on file  . Lack of Transportation (Non-Medical):  Not on file  Physical Activity:   . Days of Exercise per Week: Not on file  . Minutes of Exercise per Session: Not on file  Stress:   . Feeling of Stress : Not on file  Social Connections:   . Frequency of Communication with Friends and Family: Not on file  . Frequency of Social Gatherings with Friends and Family: Not on file  . Attends Religious Services: Not on file  . Active Member of Clubs or Organizations: Not on file  . Attends Banker Meetings: Not on file  . Marital Status: Not on file  Intimate Partner Violence:   . Fear of Current or Ex-Partner: Not on file  . Emotionally Abused: Not on file  . Physically Abused: Not on file  . Sexually Abused: Not on file   Review of Systems  Constitutional: Negative for activity change, appetite change, fatigue and unexpected weight change.  HENT: Negative for congestion, rhinorrhea, sneezing and sore throat.   Eyes: Negative for photophobia and visual disturbance.  Respiratory: Negative for cough, chest tightness and shortness of breath.   Cardiovascular: Negative for chest pain, palpitations and leg swelling.  Gastrointestinal: Negative for abdominal pain, constipation, diarrhea, nausea and vomiting.  Genitourinary: Negative for dysuria and frequency.   Musculoskeletal: Positive for arthralgias. Negative for back pain, joint swelling, myalgias and neck pain.  Skin: Negative for color change and rash.  Neurological: Negative for tremors, weakness, numbness and headaches.  Hematological: Negative for adenopathy. Does not bruise/bleed easily.  Psychiatric/Behavioral: Negative for behavioral problems (Depression), sleep disturbance and suicidal ideas. The patient is not nervous/anxious.    Vital Signs: BP 132/64   Pulse 84   Temp 98.1 F (36.7 C)   Resp 16   Ht 5\' 11"  (1.803 m)   Wt 237 lb 3.2 oz (107.6 kg)   SpO2 93%   BMI 33.08 kg/m    Physical Exam Vitals reviewed.  Constitutional:      Appearance: Normal appearance.  HENT:     Mouth/Throat:     Mouth: Mucous membranes are moist.     Pharynx: Oropharynx is clear.  Cardiovascular:     Rate and Rhythm: Normal rate and regular rhythm.     Pulses: Normal pulses.     Heart sounds: Normal heart sounds.  Pulmonary:     Effort: Pulmonary effort is normal.     Breath sounds: Normal breath sounds.  Abdominal:     General: Abdomen is flat. Bowel sounds are normal.  Musculoskeletal:        General: Normal range of motion.     Cervical back: Normal range of motion.  Skin:    General: Skin is warm.  Neurological:     General: No focal deficit present.     Mental Status: He is alert and oriented to person, place, and time. Mental status is at baseline.  Psychiatric:        Mood and Affect: Mood normal.        Behavior: Behavior normal.        Thought Content: Thought content normal.    Assessment/Plan: 1. Type 2 diabetes mellitus with hyperglycemia, unspecified whether long term insulin use (HCC) A1C 6.4 today, well controlled on current regimen. Levels have slowly decreased over last few visits. If continues to drop, will need to consider decreasing insulin amounts. Due to age hypoglycemic events need to be avoided. Will continue to monitor A1C levels.  - POCT HgB A1C  2.  Essential hypertension BP  stable today, continue with current therapy. Encouraged to continue to monitor his BP at home.  3. BMI 33.0-33.9,adult Encouraged to increase his daily activities and exercise levels and to focus on healthy eating habits. Discussed the negative impacts being overweight has on his overall health.  4. Atherosclerosis of both carotid arteries - pt has h/o atherosclerosis, will need follow up to look at progression of disease   General Counseling: Joaquim Nam understanding of the findings of todays visit and agrees with plan of treatment. I have discussed any further diagnostic evaluation that may be needed or ordered today. We also reviewed his medications today. he has been encouraged to call the office with any questions or concerns that should arise related to todays visit.    Orders Placed This Encounter  Procedures  . US Carotid Bilateral  . POCT HgB A1C      Time spent: 30 Minutes   This patient was seen by Leeanne Deed AGNP-C in Collaboration with Dr Lyndon Code as a part of collaborative care agreement     Lubertha Basque. Marrissa Dai AGNP-C Internal medicine

## 2020-08-07 ENCOUNTER — Ambulatory Visit: Payer: Medicare HMO

## 2020-08-07 DIAGNOSIS — I6523 Occlusion and stenosis of bilateral carotid arteries: Secondary | ICD-10-CM

## 2020-08-07 DIAGNOSIS — E1165 Type 2 diabetes mellitus with hyperglycemia: Secondary | ICD-10-CM

## 2020-08-08 DIAGNOSIS — R69 Illness, unspecified: Secondary | ICD-10-CM | POA: Diagnosis not present

## 2020-08-14 NOTE — Procedures (Signed)
[facility] [address] [place], [place] [address]  DATE OF SERVICE: 08/07/2020  CAROTID DOPPLER INTERPRETATION:  Bilateral Carotid Ultrsasound and Color Doppler Examination was performed. The RIGHT CCA shows mild plaque in the vessel. The LEFT CCA shows mild plaque in the vessel. There was no intimal thickening noted in the RIGHT carotid artery. There was no intimal thickening in the LEFT carotid artery.  The RIGHT CCA shows peak systolic velocity of 144cm per second. The end diastolic velocity is 25cm per second on the RIGHT side. The RIGHT ICA shows peak systolic velocity of 58 per second. RIGHT sided ICA end diastolic velocity is 14cm per second. The RIGHT ECA shows a peak systolic velocity of 120cm per second. The ICA/CCA ratio is calculated to be 0.59. This suggests 50-60% stenosis. The Vertebral Artery shows antegrade flow.  The LEFT CCA shows peak systolic velocity of 129cm per second. The end diastolic velocity is 18cm per second on the LEFT side. The LEFT ICA shows peak systolic velocity of 67per second. LEFT sided ICA end diastolic velocity is 16cm per second. The LEFT ECA shows a peak systolic velocity of 151cm per second. The ICA/CCA ratio is calculated to be 0.52. This suggests 50-60% stenosis. The Vertebral Artery shows antegrade flow.   Impression:    The RIGHT CAROTID shows 50-60% stenosis. The LEFT CAROTID shows 50-60% stenosis.  There is mild plaque formation noted on the LEFT and mild plaque on the RIGHT  side. Consider a repeat Carotid doppler if clinical situation and symptoms warrant in 6-12 months. Patient should be encouraged to change lifestyles such as smoking cessation, regular exercise and dietary modification. Use of statins in the right clinical setting and ASA is encouraged.  Yevonne Pax, MD [name] Pulmonary Critical Care Medicine

## 2020-09-09 ENCOUNTER — Other Ambulatory Visit: Payer: Self-pay

## 2020-09-09 MED ORDER — ATORVASTATIN CALCIUM 40 MG PO TABS: 40.0000 mg | ORAL_TABLET | Freq: Every day | ORAL | 1 refills | Status: DC

## 2020-09-22 ENCOUNTER — Ambulatory Visit (INDEPENDENT_AMBULATORY_CARE_PROVIDER_SITE_OTHER): Payer: Medicare HMO | Admitting: Internal Medicine

## 2020-09-22 ENCOUNTER — Encounter: Payer: Self-pay | Admitting: Internal Medicine

## 2020-09-22 ENCOUNTER — Other Ambulatory Visit: Payer: Self-pay

## 2020-09-22 DIAGNOSIS — I6523 Occlusion and stenosis of bilateral carotid arteries: Secondary | ICD-10-CM | POA: Diagnosis not present

## 2020-09-22 DIAGNOSIS — E782 Mixed hyperlipidemia: Secondary | ICD-10-CM

## 2020-09-22 DIAGNOSIS — E1165 Type 2 diabetes mellitus with hyperglycemia: Secondary | ICD-10-CM

## 2020-09-22 DIAGNOSIS — E890 Postprocedural hypothyroidism: Secondary | ICD-10-CM | POA: Diagnosis not present

## 2020-09-22 DIAGNOSIS — I5189 Other ill-defined heart diseases: Secondary | ICD-10-CM

## 2020-09-22 DIAGNOSIS — R0602 Shortness of breath: Secondary | ICD-10-CM

## 2020-09-22 DIAGNOSIS — Z794 Long term (current) use of insulin: Secondary | ICD-10-CM | POA: Diagnosis not present

## 2020-09-22 LAB — POCT GLYCOSYLATED HEMOGLOBIN (HGB A1C): Hemoglobin A1C: 6.1 % — AB (ref 4.0–5.6)

## 2020-09-22 NOTE — Progress Notes (Signed)
[facility] [address] [place], [place] [address]  Internal MEDICINE  Office Visit Note  Patient Name: [name]  [id number]  [id number]  Date of Service: 09/22/2020  Chief Complaint  Patient presents with  . Follow-up    3 month  . Hypertension  . Hyperlipidemia  . Gastroesophageal Reflux  . Diabetes  . policy update form    received  . Quality Metric Gaps    PNA    HPI Pt is here for routine follow up 1.Type I DM over 30 years. On insulin with excellent diabetic control ( hgA1c 6.1). No hypoglycemic events, wife is cooking for him 2. Carotid dopplers results reviewed, mild atherosclerosis with 50-60% occlusion, pt is asymptomatic  He does look somewhat sob, takes deep breaths and stops during conversation, Last echo did show left atrial enlargment and diastolic dysfunction 3. Has not been active due to right leg and knee injury and walking difficulty. On Lipitor and AS, no CV events in the past  4. Baseline renal functions are normal.  H/O thyroid ablation for graves disease by now on replacement therapy followed by endo   Current Medication: Outpatient Encounter Medications as of 09/22/2020  Medication Sig  . amoxicillin (AMOXIL) 500 MG tablet Take 2,000 mg by mouth as needed (Prior to dental procedures).  Marland Kitchen aspirin EC 81 MG tablet Take 81 mg by mouth daily.  Marland Kitchen atorvastatin (LIPITOR) 40 MG tablet Take 1 tablet (40 mg total) by mouth daily.  . benazepril (LOTENSIN) 40 MG tablet TAKE 1 TABLET BY MOUTH EVERY DAY  . Cholecalciferol 1000 units capsule Take 1,000 Units by mouth daily.   . Cinnamon 500 MG TABS Take 1,000 mg by mouth daily.  . Glucosamine-Chondroitin 500-400 MG CAPS Take 3 tablets by mouth daily.  Marland Kitchen HUMULIN R 100 UNIT/ML injection Inject 0.15-0.17 mLs (15-17 Units total) into the skin 2 (two) times daily before a meal. And per sliding scale Pt is not to get novolin at all (Patient taking differently: Inject 15-17 Units into the skin 2 (two)  times daily before a meal. 16 units in the morning and per sliding scale Pt is not to get novolin at all)  . hydrochlorothiazide (HYDRODIURIL) 25 MG tablet TAKE 1 TABLET BY MOUTH EVERY DAY IN THE MORNING  . Insulin Glargine (LANTUS SOLOSTAR) 100 UNIT/ML Solostar Pen Inject 65 Units into the skin daily at 10 pm.  . insulin glargine (LANTUS) 100 UNIT/ML injection Inject 0.56 mLs (56 Units total) into the skin daily with breakfast.  . insulin regular (HUMULIN R) 100 units/mL injection USE 17 UNITS SUBCUTANEOUSLY TWICE DAILY WITH MEALS AND USE A SLIDING SCALE IF SUGARS GET HIGH  . [place] Ginseng 1000 MG TABS Take 1,000 mg by mouth daily.  . Omega 3 1200 MG CAPS Take 1,200 mg by mouth daily.  . ONE TOUCH ULTRA TEST test strip CHECK SUGAR 5 X A DAY FOR DX 250.3 & FLUCTUATING SUGAR  . pantoprazole (PROTONIX) 40 MG tablet TAKE 1 TABLET BY MOUTH EVERY DAY  . pyridOXINE (VITAMIN B-6) 100 MG tablet Take 100 mg by mouth daily.  Marland Kitchen thyroid (ARMOUR) 120 MG tablet Take 120 mg by mouth daily before breakfast.  . zinc gluconate 50 MG tablet Take 50 mg by mouth daily.   No facility-administered encounter medications on file as of 09/22/2020.    Surgical History: Past Surgical History:  Procedure Laterality Date  . CARPAL TUNNEL RELEASE Right 1980's  . CARPAL TUNNEL RELEASE Left 05/05/2016   Procedure:  CARPAL TUNNEL RELEASE;  Surgeon: Kennedy Bucker, MD;  Location: [facility];  Service: Orthopedics;  Laterality: Left;  . CATARACT EXTRACTION W/PHACO Left 05/12/2015   Procedure: CATARACT EXTRACTION PHACO AND INTRAOCULAR LENS PLACEMENT (IOC);  Surgeon: Galen Manila, MD;  Location: [facility];  Service: Ophthalmology;  Laterality: Left;  [place] 01:53.1AP% 21.4CDE 24.82fluid pack lot # [id number]  . CATARACT EXTRACTION W/PHACO Right 07/02/2019   Procedure: CATARACT EXTRACTION PHACO AND INTRAOCULAR LENS PLACEMENT (IOC) RIGHT DIABETIC  01:53.2  18.9%  21.43;  Surgeon: Galen Manila, MD;  Location: [facility];   Service: Ophthalmology;  Laterality: Right;  Diabetic - insulin  . EYE SURGERY Left    cataract extractions  . INCISION AND DRAINAGE OF WOUND Left 1988   "staph infection" left leg  . INTRAMEDULLARY (IM) NAIL INTERTROCHANTERIC Right 01/22/2018   Procedure: INTRAMEDULLARY (IM) NAIL INTERTROCHANTRIC;  Surgeon: Juanell Fairly, MD;  Location: [facility];  Service: Orthopedics;  Laterality: Right;  . KNEE ARTHROPLASTY Left 12/28/2015   Procedure: COMPUTER ASSISTED TOTAL KNEE ARTHROPLASTY;  Surgeon: Donato Heinz, MD;  Location: [facility];  Service: Orthopedics;  Laterality: Left;  . ORIF WRIST FRACTURE Left 05/05/2016   Procedure: OPEN REDUCTION INTERNAL FIXATION (ORIF) WRIST FRACTURE;  Surgeon: Kennedy Bucker, MD;  Location: [facility];  Service: Orthopedics;  Laterality: Left;    Medical History: Past Medical History:  Diagnosis Date  . Agent orange exposure    [place] war exposure   . Arthritis    "left knee" (03/18/2014)  . Closed pelvic fracture (HCC) 03/18/2014   "multiple S/P fall w/loss of consciousness; CBG 29"  . Encephalopathy acute 08/16/2013  . GERD (gastroesophageal reflux disease)   . High cholesterol   . History of use of hearing aid in both ears    does not currently wear  . Hypertension   . Hypothyroidism   . Type II diabetes mellitus (HCC)     Family History: Family History  Problem Relation Age of Onset  . Colon cancer Mother   . Cerebral aneurysm Father   . Prostate cancer Neg Hx   . Bladder Cancer Neg Hx   . Kidney cancer Neg Hx     Social History   Socioeconomic History  . Marital status: Married    Spouse name: Not on file  . Number of children: Not on file  . Years of education: Not on file  . Highest education level: Not on file  Occupational History  . Not on file  Tobacco Use  . Smoking status: Former Smoker    Packs/day: 3.00    Years: 30.00    Pack years: 90.00    Types: Cigarettes    Quit date: 1988    Years since quitting: 33.9  .  Smokeless tobacco: Never Used  . Tobacco comment: "quit smoking in 1988"  Vaping Use  . Vaping Use: Never used  Substance and Sexual Activity  . Alcohol use: No  . Drug use: No  . Sexual activity: Never  Other Topics Concern  . Not on file  Social History Narrative  . Not on file   Social Determinants of Health   Financial Resource Strain:   . Difficulty of Paying Living Expenses: Not on file  Food Insecurity:   . Worried About Programme researcher, broadcasting/film/video in the Last Year: Not on file  . Ran Out of Food in the Last Year: Not on file  Transportation Needs:   . Lack of Transportation (Medical): Not on file  . Lack of  Transportation (Non-Medical): Not on file  Physical Activity:   . Days of Exercise per Week: Not on file  . Minutes of Exercise per Session: Not on file  Stress:   . Feeling of Stress : Not on file  Social Connections:   . Frequency of Communication with Friends and Family: Not on file  . Frequency of Social Gatherings with Friends and Family: Not on file  . Attends Religious Services: Not on file  . Active Member of Clubs or Organizations: Not on file  . Attends Banker Meetings: Not on file  . Marital Status: Not on file  Intimate Partner Violence:   . Fear of Current or Ex-Partner: Not on file  . Emotionally Abused: Not on file  . Physically Abused: Not on file  . Sexually Abused: Not on file      Review of Systems  Constitutional: Negative for chills, fatigue and unexpected weight change.  HENT: Positive for postnasal drip. Negative for congestion, rhinorrhea, sneezing and sore throat.   Eyes: Negative for redness.  Respiratory: Positive for shortness of breath. Negative for cough and chest tightness.   Cardiovascular: Negative for chest pain and palpitations.  Gastrointestinal: Negative for abdominal pain, constipation, diarrhea, nausea and vomiting.  Genitourinary: Negative for dysuria and frequency.  Musculoskeletal: Positive for gait  problem. Negative for arthralgias, back pain, joint swelling and neck pain.  Skin: Negative for rash.  Neurological: Negative for tremors and numbness.  Hematological: Negative for adenopathy. Does not bruise/bleed easily.  Psychiatric/Behavioral: Negative for behavioral problems (Depression), sleep disturbance and suicidal ideas. The patient is not nervous/anxious.     Vital Signs: BP (!) 148/62   Pulse 80   Temp 98.2 F (36.8 C)   Resp 16   Ht 5\' 11"  (1.803 m)   Wt 235 lb 6.4 oz (106.8 kg)   SpO2 96%   BMI 32.83 kg/m    Physical Exam Constitutional:      General: He is not in acute distress.    Appearance: He is well-developed. He is not diaphoretic.  HENT:     Head: Normocephalic and atraumatic.     Mouth/Throat:     Pharynx: No oropharyngeal exudate.  Eyes:     Pupils: Pupils are equal, round, and reactive to light.  Neck:     Thyroid: No thyromegaly.     Vascular: No JVD.     Trachea: No tracheal deviation.  Cardiovascular:     Rate and Rhythm: Normal rate and regular rhythm.     Heart sounds: Normal heart sounds. No murmur heard.  No friction rub. No gallop.   Pulmonary:     Effort: Pulmonary effort is normal. No respiratory distress.     Breath sounds: No wheezing or rales.  Chest:     Chest wall: No tenderness.  Abdominal:     General: Bowel sounds are normal.     Palpations: Abdomen is soft.  Musculoskeletal:        General: Normal range of motion.     Cervical back: Normal range of motion and neck supple.  Lymphadenopathy:     Cervical: No cervical adenopathy.  Skin:    General: Skin is warm and dry.  Neurological:     Mental Status: He is alert and oriented to person, place, and time.     Cranial Nerves: No cranial nerve deficit.  Psychiatric:        Behavior: Behavior normal.        Thought Content: Thought content  normal.        Judgment: Judgment normal.        Assessment/Plan: 1. Type 2 diabetes mellitus with hyperglycemia, with  long-term current use of insulin (HCC) Improved hg 6.1. continue Insulin regimen as before  - POCT HgB A1C  2. Shortness of breath Worsening, will repeat echo, will need to see cardiology  - ECHOCARDIOGRAM COMPLETE; Future  3. Diastolic dysfunction Will revaluate, if worsening, need other cardiac work up  - ECHOCARDIOGRAM COMPLETE; Future  4. Mixed hyperlipidemia Continue Lipitor as before   5. Postprocedural hypothyroidism Followed by Endocrinology   6. Occlusion and stenosis of bilateral carotid arteries Continue Lipitor as before  General Counseling: [name] verbalizes understanding of the findings of todays visit and agrees with plan of treatment. I have discussed any further diagnostic evaluation that may be needed or ordered today. We also reviewed his medications today. he has been encouraged to call the office with any questions or concerns that should arise related to todays visit.    Orders Placed This Encounter  Procedures  . POCT HgB A1C  . ECHOCARDIOGRAM COMPLETE      Total time spent:35 Minutes Time spent includes review of chart, medications, test results, and follow up plan with the patient.    Dr Lyndon CodeFozia M Mattilynn Forrer Internal medicine

## 2020-10-01 DIAGNOSIS — R69 Illness, unspecified: Secondary | ICD-10-CM | POA: Diagnosis not present

## 2020-10-07 ENCOUNTER — Other Ambulatory Visit: Payer: Medicare HMO

## 2020-10-16 ENCOUNTER — Ambulatory Visit: Payer: Medicare HMO

## 2020-10-16 DIAGNOSIS — I5189 Other ill-defined heart diseases: Secondary | ICD-10-CM

## 2020-10-16 DIAGNOSIS — R0602 Shortness of breath: Secondary | ICD-10-CM

## 2020-10-21 ENCOUNTER — Other Ambulatory Visit: Payer: Medicare HMO

## 2020-10-28 DIAGNOSIS — R69 Illness, unspecified: Secondary | ICD-10-CM | POA: Diagnosis not present

## 2020-12-07 DIAGNOSIS — E785 Hyperlipidemia, unspecified: Secondary | ICD-10-CM | POA: Diagnosis not present

## 2020-12-07 DIAGNOSIS — I10 Essential (primary) hypertension: Secondary | ICD-10-CM | POA: Diagnosis not present

## 2020-12-07 DIAGNOSIS — E049 Nontoxic goiter, unspecified: Secondary | ICD-10-CM | POA: Diagnosis not present

## 2020-12-07 DIAGNOSIS — E1165 Type 2 diabetes mellitus with hyperglycemia: Secondary | ICD-10-CM | POA: Diagnosis not present

## 2020-12-07 DIAGNOSIS — E890 Postprocedural hypothyroidism: Secondary | ICD-10-CM | POA: Diagnosis not present

## 2020-12-07 DIAGNOSIS — E063 Autoimmune thyroiditis: Secondary | ICD-10-CM | POA: Diagnosis not present

## 2020-12-07 DIAGNOSIS — E6609 Other obesity due to excess calories: Secondary | ICD-10-CM | POA: Diagnosis not present

## 2020-12-07 DIAGNOSIS — E0520 Thyrotoxicosis with toxic multinodular goiter without thyrotoxic crisis or storm: Secondary | ICD-10-CM | POA: Diagnosis not present

## 2020-12-09 ENCOUNTER — Other Ambulatory Visit: Payer: Self-pay | Admitting: Internal Medicine

## 2020-12-15 ENCOUNTER — Telehealth: Payer: Self-pay

## 2020-12-15 NOTE — Telephone Encounter (Signed)
Mailed patient records to [facility] on 12/15/2020 at [address] [place] [address] 

## 2020-12-16 DIAGNOSIS — E890 Postprocedural hypothyroidism: Secondary | ICD-10-CM | POA: Diagnosis not present

## 2020-12-16 DIAGNOSIS — Z6832 Body mass index (BMI) 32.0-32.9, adult: Secondary | ICD-10-CM | POA: Diagnosis not present

## 2020-12-16 DIAGNOSIS — E0520 Thyrotoxicosis with toxic multinodular goiter without thyrotoxic crisis or storm: Secondary | ICD-10-CM | POA: Diagnosis not present

## 2020-12-16 DIAGNOSIS — Z7189 Other specified counseling: Secondary | ICD-10-CM | POA: Diagnosis not present

## 2020-12-16 DIAGNOSIS — E049 Nontoxic goiter, unspecified: Secondary | ICD-10-CM | POA: Diagnosis not present

## 2020-12-16 DIAGNOSIS — E063 Autoimmune thyroiditis: Secondary | ICD-10-CM | POA: Diagnosis not present

## 2020-12-16 DIAGNOSIS — E1165 Type 2 diabetes mellitus with hyperglycemia: Secondary | ICD-10-CM | POA: Diagnosis not present

## 2020-12-16 DIAGNOSIS — I10 Essential (primary) hypertension: Secondary | ICD-10-CM | POA: Diagnosis not present

## 2020-12-16 DIAGNOSIS — E6609 Other obesity due to excess calories: Secondary | ICD-10-CM | POA: Diagnosis not present

## 2020-12-16 DIAGNOSIS — E785 Hyperlipidemia, unspecified: Secondary | ICD-10-CM | POA: Diagnosis not present

## 2020-12-23 ENCOUNTER — Encounter: Payer: Self-pay | Admitting: Hospice and Palliative Medicine

## 2020-12-23 ENCOUNTER — Other Ambulatory Visit: Payer: Self-pay

## 2020-12-23 ENCOUNTER — Ambulatory Visit (INDEPENDENT_AMBULATORY_CARE_PROVIDER_SITE_OTHER): Payer: Medicare HMO | Admitting: Hospice and Palliative Medicine

## 2020-12-23 VITALS — BP 119/76 | HR 93 | Temp 98.0°F | Resp 16 | Ht 71.0 in | Wt 236.8 lb

## 2020-12-23 DIAGNOSIS — E1165 Type 2 diabetes mellitus with hyperglycemia: Secondary | ICD-10-CM

## 2020-12-23 DIAGNOSIS — I517 Cardiomegaly: Secondary | ICD-10-CM

## 2020-12-23 DIAGNOSIS — Z794 Long term (current) use of insulin: Secondary | ICD-10-CM

## 2020-12-23 DIAGNOSIS — E782 Mixed hyperlipidemia: Secondary | ICD-10-CM | POA: Diagnosis not present

## 2020-12-23 LAB — POCT GLYCOSYLATED HEMOGLOBIN (HGB A1C): Hemoglobin A1C: 6.8 % — AB (ref 4.0–5.6)

## 2020-12-23 NOTE — Progress Notes (Signed)
[facility] [address] [place], [place] [address]  Internal MEDICINE  Office Visit Note  Patient Name: [name]  [id number]  [id number]  Date of Service: 12/24/2020  Chief Complaint  Patient presents with  . Follow-up  . Diabetes  . Gastroesophageal Reflux  . Hyperlipidemia  . Hypertension    HPI Patient is here for routine follow-up Type 2 DM--has had a few episodes of hypoglycemia since last visit, aware of the cause--has not eaten enough with his dinner on the days he experienced hypoglycemia Reviewed his echocardiogram-normal LVEF, diastolic dysfunction, mild LVH, LAE, mild MR, TR and PR Discussed these findings, he does not report feeling SHOB, mild ankle edema but this is not disrupting his daily life or ADLs At this time he is not wanting to pursuit further testing   Current Medication: Outpatient Encounter Medications as of 12/23/2020  Medication Sig  . amoxicillin (AMOXIL) 500 MG tablet Take 2,000 mg by mouth as needed (Prior to dental procedures).  Marland Kitchen aspirin EC 81 MG tablet Take 81 mg by mouth daily.  Marland Kitchen atorvastatin (LIPITOR) 40 MG tablet Take 1 tablet (40 mg total) by mouth daily.  . benazepril (LOTENSIN) 40 MG tablet TAKE 1 TABLET BY MOUTH EVERY DAY  . Cholecalciferol 1000 units capsule Take 1,000 Units by mouth daily.   . Cinnamon 500 MG TABS Take 1,000 mg by mouth daily.  . Glucosamine-Chondroitin 500-400 MG CAPS Take 3 tablets by mouth daily.  Marland Kitchen HUMULIN R 100 UNIT/ML injection Inject 0.15-0.17 mLs (15-17 Units total) into the skin 2 (two) times daily before a meal. And per sliding scale Pt is not to get novolin at all (Patient taking differently: Inject 15-17 Units into the skin 2 (two) times daily before a meal. 16 units in the morning and per sliding scale Pt is not to get novolin at all)  . hydrochlorothiazide (HYDRODIURIL) 25 MG tablet TAKE 1 TABLET BY MOUTH EVERY DAY IN THE MORNING  . Insulin Glargine (LANTUS SOLOSTAR) 100 UNIT/ML  Solostar Pen Inject 65 Units into the skin daily at 10 pm.  . insulin glargine (LANTUS) 100 UNIT/ML injection Inject 0.56 mLs (56 Units total) into the skin daily with breakfast.  . insulin regular (HUMULIN R) 100 units/mL injection USE 17 UNITS SUBCUTANEOUSLY TWICE DAILY WITH MEALS AND USE A SLIDING SCALE IF SUGARS GET HIGH  . [place] Ginseng 1000 MG TABS Take 1,000 mg by mouth daily.  . Omega 3 1200 MG CAPS Take 1,200 mg by mouth daily.  . ONE TOUCH ULTRA TEST test strip CHECK SUGAR 5 X A DAY FOR DX 250.3 & FLUCTUATING SUGAR  . pantoprazole (PROTONIX) 40 MG tablet TAKE 1 TABLET BY MOUTH EVERY DAY  . pyridOXINE (VITAMIN B-6) 100 MG tablet Take 100 mg by mouth daily.  Marland Kitchen thyroid (ARMOUR) 120 MG tablet Take 120 mg by mouth daily before breakfast.  . zinc gluconate 50 MG tablet Take 50 mg by mouth daily.   No facility-administered encounter medications on file as of 12/23/2020.    Surgical History: Past Surgical History:  Procedure Laterality Date  . CARPAL TUNNEL RELEASE Right 1980's  . CARPAL TUNNEL RELEASE Left 05/05/2016   Procedure: CARPAL TUNNEL RELEASE;  Surgeon: Kennedy Bucker, MD;  Location: [facility];  Service: Orthopedics;  Laterality: Left;  . CATARACT EXTRACTION W/PHACO Left 05/12/2015   Procedure: CATARACT EXTRACTION PHACO AND INTRAOCULAR LENS PLACEMENT (IOC);  Surgeon: Galen Manila, MD;  Location: [facility];  Service: Ophthalmology;  Laterality: Left;  [place] 01:53.1AP% 21.4CDE  24.39fluid pack lot # [id number]  . CATARACT EXTRACTION W/PHACO Right 07/02/2019   Procedure: CATARACT EXTRACTION PHACO AND INTRAOCULAR LENS PLACEMENT (IOC) RIGHT DIABETIC  01:53.2  18.9%  21.43;  Surgeon: Galen Manila, MD;  Location: [facility];  Service: Ophthalmology;  Laterality: Right;  Diabetic - insulin  . EYE SURGERY Left    cataract extractions  . INCISION AND DRAINAGE OF WOUND Left 1988   "staph infection" left leg  . INTRAMEDULLARY (IM) NAIL INTERTROCHANTERIC Right 01/22/2018   Procedure:  INTRAMEDULLARY (IM) NAIL INTERTROCHANTRIC;  Surgeon: Juanell Fairly, MD;  Location: [facility];  Service: Orthopedics;  Laterality: Right;  . KNEE ARTHROPLASTY Left 12/28/2015   Procedure: COMPUTER ASSISTED TOTAL KNEE ARTHROPLASTY;  Surgeon: Donato Heinz, MD;  Location: [facility];  Service: Orthopedics;  Laterality: Left;  . ORIF WRIST FRACTURE Left 05/05/2016   Procedure: OPEN REDUCTION INTERNAL FIXATION (ORIF) WRIST FRACTURE;  Surgeon: Kennedy Bucker, MD;  Location: [facility];  Service: Orthopedics;  Laterality: Left;    Medical History: Past Medical History:  Diagnosis Date  . Agent orange exposure    [place] war exposure   . Arthritis    "left knee" (03/18/2014)  . Closed pelvic fracture (HCC) 03/18/2014   "multiple S/P fall w/loss of consciousness; CBG 29"  . Encephalopathy acute 08/16/2013  . GERD (gastroesophageal reflux disease)   . High cholesterol   . History of use of hearing aid in both ears    does not currently wear  . Hypertension   . Hypothyroidism   . Type II diabetes mellitus (HCC)     Family History: Family History  Problem Relation Age of Onset  . Colon cancer Mother   . Cerebral aneurysm Father   . Prostate cancer Neg Hx   . Bladder Cancer Neg Hx   . Kidney cancer Neg Hx     Social History   Socioeconomic History  . Marital status: Married    Spouse name: Not on file  . Number of children: Not on file  . Years of education: Not on file  . Highest education level: Not on file  Occupational History  . Not on file  Tobacco Use  . Smoking status: Former Smoker    Packs/day: 3.00    Years: 30.00    Pack years: 90.00    Types: Cigarettes    Quit date: 1988    Years since quitting: 34.1  . Smokeless tobacco: Never Used  . Tobacco comment: "quit smoking in 1988"  Vaping Use  . Vaping Use: Never used  Substance and Sexual Activity  . Alcohol use: No  . Drug use: No  . Sexual activity: Never  Other Topics Concern  . Not on file  Social History  Narrative  . Not on file   Social Determinants of Health   Financial Resource Strain: Not on file  Food Insecurity: Not on file  Transportation Needs: Not on file  Physical Activity: Not on file  Stress: Not on file  Social Connections: Not on file  Intimate Partner Violence: Not on file      Review of Systems  Constitutional: Negative for chills, fatigue and unexpected weight change.  HENT: Negative for congestion, postnasal drip, rhinorrhea, sneezing and sore throat.   Eyes: Negative for redness.  Respiratory: Negative for cough, chest tightness and shortness of breath.   Cardiovascular: Negative for chest pain and palpitations.  Gastrointestinal: Negative for abdominal pain, constipation, diarrhea, nausea and vomiting.  Genitourinary: Negative for dysuria and frequency.  Musculoskeletal:  Negative for arthralgias, back pain, joint swelling and neck pain.  Skin: Negative for rash.  Neurological: Negative for tremors and numbness.  Hematological: Negative for adenopathy. Does not bruise/bleed easily.  Psychiatric/Behavioral: Negative for behavioral problems (Depression), sleep disturbance and suicidal ideas. The patient is not nervous/anxious.     Vital Signs: BP 119/76   Pulse 93   Temp 98 F (36.7 C)   Resp 16   Ht 5\' 11"  (1.803 m)   Wt 236 lb 12.8 oz (107.4 kg)   SpO2 94%   BMI 33.03 kg/m    Physical Exam Vitals reviewed.  Constitutional:      Appearance: Normal appearance. He is normal weight.  Cardiovascular:     Rate and Rhythm: Normal rate and regular rhythm.     Pulses: Normal pulses.     Heart sounds: Normal heart sounds.  Pulmonary:     Effort: Pulmonary effort is normal.     Breath sounds: Normal breath sounds.  Abdominal:     General: Abdomen is flat.     Palpations: Abdomen is soft.  Musculoskeletal:        General: Normal range of motion.     Cervical back: Normal range of motion.     Right lower leg: 1+ Edema present.     Left lower leg: 1+  Edema present.  Skin:    General: Skin is warm.  Neurological:     General: No focal deficit present.     Mental Status: He is alert and oriented to person, place, and time. Mental status is at baseline.  Psychiatric:        Mood and Affect: Mood normal.        Behavior: Behavior normal.        Thought Content: Thought content normal.        Judgment: Judgment normal.    Assessment/Plan: 1. Type 2 diabetes mellitus with hyperglycemia, with long-term current use of insulin (HCC) A1C well controlled at 6.8 Discussed importance of avoiding hypoglycemic events Continue all medications as prescribed - POCT HgB A1C  2. LVH (left ventricular hypertrophy) Also component of diastolic dysfunction, symptoms remain mild Discussed diuretic therapy--he is not interested at this time Closely monitor  3. Mixed hyperlipidemia Continue statin therapy and low dose ASA  General Counseling: understanding of the findings of todays visit and agrees with plan of treatment. I have discussed any further diagnostic evaluation that may be needed or ordered today. We also reviewed his medications today. he has been encouraged to call the office with any questions or concerns that should arise related to todays visit.    Orders Placed This Encounter  Procedures  . POCT HgB A1C   Time spent: 30 Minutes Time spent includes review of chart, medications, test results and follow-up plan with the patient.  This patient was seen by Joaquim Nam AGNP-C in Collaboration with Dr Leeanne Deed as a part of collaborative care agreement     Lyndon Code. Chasmine Lender AGNP-C Internal medicine

## 2020-12-24 ENCOUNTER — Encounter: Payer: Self-pay | Admitting: Hospice and Palliative Medicine

## 2021-01-29 ENCOUNTER — Telehealth: Payer: Self-pay | Admitting: Internal Medicine

## 2021-01-29 NOTE — Progress Notes (Signed)
  Chronic Care Management   Outreach Note  01/29/2021 Name: [name] MRN: [id number] DOB: 1941/07/21  Referred by: Lyndon Code, MD Reason for referral : No chief complaint on file.   An unsuccessful telephone outreach was attempted today. The patient was referred to the pharmacist for assistance with care management and care coordination.   Follow Up Plan:   Carley Perdue [employer] Scheduler

## 2021-02-24 ENCOUNTER — Telehealth: Payer: Self-pay | Admitting: Internal Medicine

## 2021-02-24 NOTE — Progress Notes (Signed)
  Chronic Care Management   Outreach Note  02/24/2021 Name: [name] MRN: [id number] DOB: 12/07/1940  Referred by: Lyndon Code, MD Reason for referral : No chief complaint on file.   A second unsuccessful telephone outreach was attempted today. The patient was referred to pharmacist for assistance with care management and care coordination.  Follow Up Plan:   Carley Perdue [employer] Scheduler

## 2021-02-26 DIAGNOSIS — E119 Type 2 diabetes mellitus without complications: Secondary | ICD-10-CM | POA: Diagnosis not present

## 2021-03-11 ENCOUNTER — Other Ambulatory Visit: Payer: Self-pay | Admitting: Internal Medicine

## 2021-03-15 DIAGNOSIS — E049 Nontoxic goiter, unspecified: Secondary | ICD-10-CM | POA: Diagnosis not present

## 2021-03-15 DIAGNOSIS — E063 Autoimmune thyroiditis: Secondary | ICD-10-CM | POA: Diagnosis not present

## 2021-03-15 DIAGNOSIS — E0520 Thyrotoxicosis with toxic multinodular goiter without thyrotoxic crisis or storm: Secondary | ICD-10-CM | POA: Diagnosis not present

## 2021-03-15 DIAGNOSIS — E1165 Type 2 diabetes mellitus with hyperglycemia: Secondary | ICD-10-CM | POA: Diagnosis not present

## 2021-03-15 DIAGNOSIS — E890 Postprocedural hypothyroidism: Secondary | ICD-10-CM | POA: Diagnosis not present

## 2021-03-16 ENCOUNTER — Telehealth: Payer: Self-pay | Admitting: Internal Medicine

## 2021-03-16 NOTE — Progress Notes (Signed)
  Chronic Care Management   Outreach Note  03/16/2021 Name: [name] MRN: [id number] DOB: Aug 21, 1941  Referred by: Lyndon Code, MD Reason for referral : No chief complaint on file.   Third unsuccessful telephone outreach was attempted today. The patient was referred to the pharmacist for assistance with care management and care coordination.   Follow Up Plan:   Carley Perdue [employer] Scheduler

## 2021-03-22 DIAGNOSIS — E785 Hyperlipidemia, unspecified: Secondary | ICD-10-CM | POA: Diagnosis not present

## 2021-03-22 DIAGNOSIS — E049 Nontoxic goiter, unspecified: Secondary | ICD-10-CM | POA: Diagnosis not present

## 2021-03-22 DIAGNOSIS — E6609 Other obesity due to excess calories: Secondary | ICD-10-CM | POA: Diagnosis not present

## 2021-03-22 DIAGNOSIS — E1165 Type 2 diabetes mellitus with hyperglycemia: Secondary | ICD-10-CM | POA: Diagnosis not present

## 2021-03-22 DIAGNOSIS — E063 Autoimmune thyroiditis: Secondary | ICD-10-CM | POA: Diagnosis not present

## 2021-03-22 DIAGNOSIS — E890 Postprocedural hypothyroidism: Secondary | ICD-10-CM | POA: Diagnosis not present

## 2021-03-22 DIAGNOSIS — I10 Essential (primary) hypertension: Secondary | ICD-10-CM | POA: Diagnosis not present

## 2021-03-22 DIAGNOSIS — E0520 Thyrotoxicosis with toxic multinodular goiter without thyrotoxic crisis or storm: Secondary | ICD-10-CM | POA: Diagnosis not present

## 2021-03-23 ENCOUNTER — Encounter: Payer: Self-pay | Admitting: Nurse Practitioner

## 2021-03-23 ENCOUNTER — Ambulatory Visit (INDEPENDENT_AMBULATORY_CARE_PROVIDER_SITE_OTHER): Payer: Medicare HMO | Admitting: Nurse Practitioner

## 2021-03-23 DIAGNOSIS — I517 Cardiomegaly: Secondary | ICD-10-CM | POA: Diagnosis not present

## 2021-03-23 DIAGNOSIS — E782 Mixed hyperlipidemia: Secondary | ICD-10-CM

## 2021-03-23 DIAGNOSIS — E1165 Type 2 diabetes mellitus with hyperglycemia: Secondary | ICD-10-CM | POA: Diagnosis not present

## 2021-03-23 DIAGNOSIS — I10 Essential (primary) hypertension: Secondary | ICD-10-CM | POA: Diagnosis not present

## 2021-03-23 DIAGNOSIS — Z794 Long term (current) use of insulin: Secondary | ICD-10-CM

## 2021-03-23 LAB — POCT GLYCOSYLATED HEMOGLOBIN (HGB A1C): Hemoglobin A1C: 6.5 % — AB (ref 4.0–5.6)

## 2021-03-23 NOTE — Progress Notes (Signed)
[facility] [address] [place], [place] [address]  Internal MEDICINE  Office Visit Note  Patient Name: [name]  [id number]  [id number]  Date of Service: 03/23/2021  Chief Complaint  Patient presents with  . Follow-up    Discuss meds  . Diabetes  . Gastroesophageal Reflux  . Hyperlipidemia  . Hypertension    HPI [name] presents for a follow up visit to check A1C and discuss medications, GERD, HLD, HTN and diabetes. Sugars run  -Type 2 DM--A1C is 6.5 today (3 months ago was 6.8). has a history of hypoglycemic events, aware of the cause--has not eaten enough with his dinner on the days he experiences hypoglycemia. His sugars range from 100-200 most of the time, glucose level during visit is 172. He does have periods of time where he dips down to the 80s.  -Blood pressure is 156/72 today. He states that is a little high for him. He takes benazepril and hydrochlorothiazide.  -takes protonix for GERD, reports that the medication is effective and he has no issues at this time.  -taking atorvastatin for hyperlipidemia. Lipid panel from July 2021 was normal. Will need to recheck lipid panel.  Patient states he did fasting labs at Dr. Jerline Pain office 2 weeks ago. Will need to request a copy of results.   Current Medication: Outpatient Encounter Medications as of 03/23/2021  Medication Sig  . amoxicillin (AMOXIL) 500 MG tablet Take 2,000 mg by mouth as needed (Prior to dental procedures).  Marland Kitchen aspirin EC 81 MG tablet Take 81 mg by mouth daily.  Marland Kitchen atorvastatin (LIPITOR) 40 MG tablet TAKE 1 TABLET BY MOUTH EVERY DAY  . benazepril (LOTENSIN) 40 MG tablet TAKE 1 TABLET BY MOUTH EVERY DAY  . Cholecalciferol 1000 units capsule Take 1,000 Units by mouth daily.   . Cinnamon 500 MG TABS Take 1,000 mg by mouth daily.  . Glucosamine-Chondroitin 500-400 MG CAPS Take 3 tablets by mouth daily.  Marland Kitchen HUMULIN R 100 UNIT/ML injection Inject 0.15-0.17 mLs (15-17 Units total) into the skin  2 (two) times daily before a meal. And per sliding scale Pt is not to get novolin at all (Patient taking differently: Inject 15-17 Units into the skin 2 (two) times daily before a meal. 16 units in the morning and per sliding scale Pt is not to get novolin at all)  . hydrochlorothiazide (HYDRODIURIL) 25 MG tablet TAKE 1 TABLET BY MOUTH EVERY DAY IN THE MORNING  . Insulin Glargine (LANTUS SOLOSTAR) 100 UNIT/ML Solostar Pen Inject 65 Units into the skin daily at 10 pm.  . insulin glargine (LANTUS) 100 UNIT/ML injection Inject 0.56 mLs (56 Units total) into the skin daily with breakfast.  . insulin regular (HUMULIN R) 100 units/mL injection USE 17 UNITS SUBCUTANEOUSLY TWICE DAILY WITH MEALS AND USE A SLIDING SCALE IF SUGARS GET HIGH  . [place] Ginseng 1000 MG TABS Take 1,000 mg by mouth daily.  . Omega 3 1200 MG CAPS Take 1,200 mg by mouth daily.  . ONE TOUCH ULTRA TEST test strip CHECK SUGAR 5 X A DAY FOR DX 250.3 & FLUCTUATING SUGAR  . pantoprazole (PROTONIX) 40 MG tablet TAKE 1 TABLET BY MOUTH EVERY DAY  . pyridOXINE (VITAMIN B-6) 100 MG tablet Take 100 mg by mouth daily.  Marland Kitchen thyroid (ARMOUR) 120 MG tablet Take 120 mg by mouth daily before breakfast.  . zinc gluconate 50 MG tablet Take 50 mg by mouth daily.   No facility-administered encounter medications on file as of 03/23/2021.  Surgical History: Past Surgical History:  Procedure Laterality Date  . CARPAL TUNNEL RELEASE Right 1980's  . CARPAL TUNNEL RELEASE Left 05/05/2016   Procedure: CARPAL TUNNEL RELEASE;  Surgeon: Kennedy BuckerMichael Menz, MD;  Location: [facility];  Service: Orthopedics;  Laterality: Left;  . CATARACT EXTRACTION W/PHACO Left 05/12/2015   Procedure: CATARACT EXTRACTION PHACO AND INTRAOCULAR LENS PLACEMENT (IOC);  Surgeon: Galen ManilaWilliam Porfilio, MD;  Location: [facility];  Service: Ophthalmology;  Laterality: Left;  [place] 01:53.1AP% 21.4CDE 24.6430fluid pack lot # [id number]  . CATARACT EXTRACTION W/PHACO Right 07/02/2019   Procedure: CATARACT  EXTRACTION PHACO AND INTRAOCULAR LENS PLACEMENT (IOC) RIGHT DIABETIC  01:53.2  18.9%  21.43;  Surgeon: Galen ManilaPorfilio, William, MD;  [facility];  Service: Ophthalmology;  Laterality: Right;  Diabetic - insulin  . EYE SURGERY Left    cataract extractions  . INCISION AND DRAINAGE OF WOUND Left 1988   "staph infection" left leg  . INTRAMEDULLARY (IM) NAIL INTERTROCHANTERIC Right 01/22/2018   Procedure: INTRAMEDULLARY (IM) NAIL INTERTROCHANTRIC;  Surgeon: Juanell FairlyKrasinski, Kevin, MD;  Location: [facility];  Service: Orthopedics;  Laterality: Right;  . KNEE ARTHROPLASTY Left 12/28/2015   Procedure: COMPUTER ASSISTED TOTAL KNEE ARTHROPLASTY;  Surgeon: Donato HeinzJames P Hooten, MD;  Location: [facility];  Service: Orthopedics;  Laterality: Left;  . ORIF WRIST FRACTURE Left 05/05/2016   Procedure: OPEN REDUCTION INTERNAL FIXATION (ORIF) WRIST FRACTURE;  Surgeon: Kennedy BuckerMichael Menz, MD;  Location: [facility];  Service: Orthopedics;  Laterality: Left;    Medical History: Past Medical History:  Diagnosis Date  . Agent orange exposure    [place] war exposure   . Arthritis    "left knee" (03/18/2014)  . Closed pelvic fracture (HCC) 03/18/2014   "multiple S/P fall w/loss of consciousness; CBG 29"  . Encephalopathy acute 08/16/2013  . GERD (gastroesophageal reflux disease)   . High cholesterol   . History of use of hearing aid in both ears    does not currently wear  . Hypertension   . Hypothyroidism   . Type II diabetes mellitus (HCC)     Family History: Family History  Problem Relation Age of Onset  . Colon cancer Mother   . Cerebral aneurysm Father   . Prostate cancer Neg Hx   . Bladder Cancer Neg Hx   . Kidney cancer Neg Hx     Social History   Socioeconomic History  . Marital status: Married    Spouse name: Not on file  . Number of children: Not on file  . Years of education: Not on file  . Highest education level: Not on file  Occupational History  . Not on file  Tobacco Use  . Smoking status:  Former Smoker    Packs/day: 3.00    Years: 30.00    Pack years: 90.00    Types: Cigarettes    Quit date: 1988    Years since quitting: 34.4  . Smokeless tobacco: Never Used  . Tobacco comment: "quit smoking in 1988"  Vaping Use  . Vaping Use: Never used  Substance and Sexual Activity  . Alcohol use: No  . Drug use: No  . Sexual activity: Never  Other Topics Concern  . Not on file  Social History Narrative  . Not on file   Social Determinants of Health   Financial Resource Strain: Not on file  Food Insecurity: Not on file  Transportation Needs: Not on file  Physical Activity: Not on file  Stress: Not on file  Social Connections: Not on file  Intimate  Partner Violence: Not on file      Review of Systems  Constitutional: Negative.  Negative for chills, fatigue and unexpected weight change.  HENT: Negative for congestion, rhinorrhea, sneezing and sore throat.   Eyes: Negative for redness.  Respiratory: Negative.  Negative for cough, chest tightness and shortness of breath.   Cardiovascular: Negative.  Negative for chest pain and palpitations.  Gastrointestinal: Negative.  Negative for abdominal pain, constipation, diarrhea, nausea and vomiting.  Genitourinary: Negative.  Negative for dysuria and frequency.  Musculoskeletal: Negative for arthralgias, back pain, joint swelling and neck pain.  Skin: Negative for rash.  Neurological: Negative.  Negative for tremors and numbness.  Hematological: Negative for adenopathy. Does not bruise/bleed easily.  Psychiatric/Behavioral: Negative.  Negative for behavioral problems (Depression), sleep disturbance and suicidal ideas. The patient is not nervous/anxious.     Vital Signs: BP (!) 156/72   Pulse 98   Temp (!) 97.5 F (36.4 C)   Resp 16   Ht 5\' 11"  (1.803 m)   Wt 244 lb 6.4 oz (110.9 kg)   SpO2 98%   BMI 34.09 kg/m    Physical Exam Vitals reviewed.  Constitutional:      General: He is not in acute distress.     Appearance: Normal appearance. He is well-developed. He is obese. He is not diaphoretic.  HENT:     Head: Normocephalic and atraumatic.  Neck:     Thyroid: No thyromegaly.     Vascular: No JVD.     Trachea: No tracheal deviation.  Cardiovascular:     Rate and Rhythm: Normal rate and regular rhythm.     Pulses: Normal pulses.     Heart sounds: Normal heart sounds. No murmur heard. No friction rub. No gallop.   Pulmonary:     Effort: Pulmonary effort is normal. No respiratory distress.     Breath sounds: Normal breath sounds. No stridor. No wheezing, rhonchi or rales.  Chest:     Chest wall: No tenderness.  Skin:    General: Skin is warm and dry.     Capillary Refill: Capillary refill takes less than 2 seconds.  Neurological:     Mental Status: He is alert and oriented to person, place, and time.     Cranial Nerves: No cranial nerve deficit.  Psychiatric:        Mood and Affect: Mood normal.        Behavior: Behavior normal.        Thought Content: Thought content normal.        Judgment: Judgment normal.    Assessment/Plan: 1. Type 2 diabetes mellitus with hyperglycemia, with long-term current use of insulin (HCC) A1C well controlled at 6.8 Discussed importance of avoiding hypoglycemic events Continue all medications as prescribed - POCT HgB A1C  2. LVH (left ventricular hypertrophy) Also component of diastolic dysfunction, symptoms remain mild Is currently on hydrochlorothiazide for diuretic therapy.  Closely monitor  3. Essential hypertension Blood pressure is 156/72 today, recheck manually was 150/75. He is currently taking benazepril and hydrochlorothiazide. No changes in medications at this time. Will recheck blood pressure at follow up visit.   4. Mixed hyperlipidemia Continue statin therapy, stopped taking low dose aspirin due to prolonged bleeding from cuts/scrapes.   General Counseling: understanding of the findings of todays visit and agrees  with plan of treatment. I have discussed any further diagnostic evaluation that may be needed or ordered today. We also reviewed his medications today. he has been encouraged  to call the office with any questions or concerns that should arise related to todays visit.    Orders Placed This Encounter  Procedures  . POCT glycosylated hemoglobin (Hb A1C)    No orders of the defined types were placed in this encounter.  Return in about 3 months (around 06/23/2021) for F/U, Recheck A1C, Ignatz Deis PCP.   Total time spent:30 Minutes Time spent includes review of chart, medications, test results, and follow up plan with the patient.   [place] Controlled Substance Database was reviewed by me.  This patient was seen by Sallyanne Kuster, FNP-C in collaboration with Dr. Beverely Risen as a part of collaborative care agreement.   Dr Lyndon Code Internal medicine

## 2021-03-24 ENCOUNTER — Ambulatory Visit: Payer: Medicare HMO | Admitting: Nurse Practitioner

## 2021-05-27 ENCOUNTER — Ambulatory Visit: Payer: Medicare HMO | Admitting: Nurse Practitioner

## 2021-06-19 ENCOUNTER — Other Ambulatory Visit: Payer: Self-pay | Admitting: Internal Medicine

## 2021-06-23 ENCOUNTER — Encounter: Payer: Medicare HMO | Attending: Internal Medicine | Admitting: Internal Medicine

## 2021-06-23 ENCOUNTER — Encounter (INDEPENDENT_AMBULATORY_CARE_PROVIDER_SITE_OTHER): Payer: Self-pay

## 2021-06-23 ENCOUNTER — Encounter: Payer: Self-pay | Admitting: Nurse Practitioner

## 2021-06-23 ENCOUNTER — Other Ambulatory Visit: Payer: Self-pay

## 2021-06-23 ENCOUNTER — Ambulatory Visit (INDEPENDENT_AMBULATORY_CARE_PROVIDER_SITE_OTHER): Payer: Medicare HMO | Admitting: Nurse Practitioner

## 2021-06-23 VITALS — BP 157/75 | HR 70 | Temp 97.5°F | Resp 16 | Ht 71.0 in | Wt 247.6 lb

## 2021-06-23 DIAGNOSIS — R300 Dysuria: Secondary | ICD-10-CM

## 2021-06-23 DIAGNOSIS — E1165 Type 2 diabetes mellitus with hyperglycemia: Secondary | ICD-10-CM

## 2021-06-23 DIAGNOSIS — L97919 Non-pressure chronic ulcer of unspecified part of right lower leg with unspecified severity: Secondary | ICD-10-CM | POA: Insufficient documentation

## 2021-06-23 DIAGNOSIS — I10 Essential (primary) hypertension: Secondary | ICD-10-CM

## 2021-06-23 DIAGNOSIS — E119 Type 2 diabetes mellitus without complications: Secondary | ICD-10-CM | POA: Diagnosis not present

## 2021-06-23 DIAGNOSIS — Z794 Long term (current) use of insulin: Secondary | ICD-10-CM

## 2021-06-23 DIAGNOSIS — S81801A Unspecified open wound, right lower leg, initial encounter: Secondary | ICD-10-CM

## 2021-06-23 DIAGNOSIS — L03115 Cellulitis of right lower limb: Secondary | ICD-10-CM

## 2021-06-23 DIAGNOSIS — Z87891 Personal history of nicotine dependence: Secondary | ICD-10-CM | POA: Diagnosis not present

## 2021-06-23 DIAGNOSIS — E1151 Type 2 diabetes mellitus with diabetic peripheral angiopathy without gangrene: Secondary | ICD-10-CM | POA: Insufficient documentation

## 2021-06-23 DIAGNOSIS — Z0001 Encounter for general adult medical examination with abnormal findings: Secondary | ICD-10-CM

## 2021-06-23 DIAGNOSIS — E11622 Type 2 diabetes mellitus with other skin ulcer: Secondary | ICD-10-CM | POA: Insufficient documentation

## 2021-06-23 DIAGNOSIS — I129 Hypertensive chronic kidney disease with stage 1 through stage 4 chronic kidney disease, or unspecified chronic kidney disease: Secondary | ICD-10-CM | POA: Diagnosis not present

## 2021-06-23 DIAGNOSIS — E782 Mixed hyperlipidemia: Secondary | ICD-10-CM

## 2021-06-23 DIAGNOSIS — I6523 Occlusion and stenosis of bilateral carotid arteries: Secondary | ICD-10-CM

## 2021-06-23 DIAGNOSIS — N183 Chronic kidney disease, stage 3 unspecified: Secondary | ICD-10-CM

## 2021-06-23 DIAGNOSIS — E1122 Type 2 diabetes mellitus with diabetic chronic kidney disease: Secondary | ICD-10-CM | POA: Diagnosis not present

## 2021-06-23 LAB — POCT GLYCOSYLATED HEMOGLOBIN (HGB A1C): Hemoglobin A1C: 6.8 % — AB (ref 4.0–5.6)

## 2021-06-23 MED ORDER — SULFAMETHOXAZOLE-TRIMETHOPRIM 800-160 MG PO TABS: 2.0000 | ORAL_TABLET | Freq: Two times a day (BID) | ORAL | 0 refills | Status: AC

## 2021-06-23 NOTE — Progress Notes (Signed)
[facility] [address] [place], [place] [address]  Internal MEDICINE  Office Visit Note  Patient Name: Tony BucyDonald R Joseph  [id number]  [id number]  Date of Service: 06/23/2021  Chief Complaint  Patient presents with   Medicare Wellness    Right shin wound, pt fell going up steps   Diabetes   Gastroesophageal Reflux   Hyperlipidemia   Hypertension   Quality Metric Gaps    [facility], pt will get records    HPI Dorinda HillDonald presents for an annual well visit and physical exam. he has a history of arthritis, diabetes, GERD, hyperlipidemia, hypertension, hypothyroidism, has hearing aids but does not wear them, exposed to agent orange during [place] war. He has had 3 doses of COVID vaccine. Diabetic foot exam is due today. Diabetic eye exam was done on 03/01/21. He denies any pain except for in the wound on his right leg. [facility] the [facility].  He has no other concerns or questions at this time.  He had a fall over the weekend and has 2 large wounds on the front of the right lower leg. When he fell he did not go to urgent care or ER. His wife placed 2 large adhesive bandages on top of the wounds that do not cover the entire wound.  2.  Blood pressure is elevated today.     Current Medication: Outpatient Encounter Medications as of 06/23/2021  Medication Sig   amoxicillin (AMOXIL) 500 MG tablet Take 2,000 mg by mouth as needed (Prior to dental procedures).   aspirin EC 81 MG tablet Take 81 mg by mouth daily.   atorvastatin (LIPITOR) 40 MG tablet TAKE 1 TABLET BY MOUTH EVERY DAY   benazepril (LOTENSIN) 40 MG tablet TAKE 1 TABLET BY MOUTH EVERY DAY   Cholecalciferol 1000 units capsule Take 1,000 Units by mouth daily.    Cinnamon 500 MG TABS Take 1,000 mg by mouth daily.   Glucosamine-Chondroitin 500-400 MG CAPS Take 3 tablets by mouth daily.   HUMULIN R 100 UNIT/ML injection Inject 0.15-0.17 mLs (15-17 Units total) into the skin 2 (two) times  daily before a meal. And per sliding scale Pt is not to get novolin at all (Patient taking differently: Inject 15-17 Units into the skin 2 (two) times daily before a meal. 16 units in the morning and per sliding scale Pt is not to get novolin at all)   hydrochlorothiazide (HYDRODIURIL) 25 MG tablet TAKE 1 TABLET BY MOUTH EVERY DAY IN THE MORNING   Insulin Glargine (LANTUS SOLOSTAR) 100 UNIT/ML Solostar Pen Inject 65 Units into the skin daily at 10 pm.   insulin glargine (LANTUS) 100 UNIT/ML injection Inject 0.56 mLs (56 Units total) into the skin daily with breakfast.   insulin regular (HUMULIN R) 100 units/mL injection USE 17 UNITS SUBCUTANEOUSLY TWICE DAILY WITH MEALS AND USE A SLIDING SCALE IF SUGARS GET HIGH   [place] Ginseng 1000 MG TABS Take 1,000 mg by mouth daily.   Omega 3 1200 MG CAPS Take 1,200 mg by mouth daily.   ONE TOUCH ULTRA TEST test strip CHECK SUGAR 5 X A DAY FOR DX 250.3 & FLUCTUATING SUGAR   pantoprazole (PROTONIX) 40 MG tablet TAKE 1 TABLET BY MOUTH EVERY DAY   pyridOXINE (VITAMIN B-6) 100 MG tablet Take 100 mg by mouth daily.   [EXPIRED] sulfamethoxazole-trimethoprim (BACTRIM DS) 800-160 MG tablet Take 2 tablets by mouth 2 (two) times daily for 10 days.   thyroid (  ARMOUR) 120 MG tablet Take 120 mg by mouth daily before breakfast.   zinc gluconate 50 MG tablet Take 50 mg by mouth daily.   No facility-administered encounter medications on file as of 06/23/2021.    Surgical History: Past Surgical History:  Procedure Laterality Date   CARPAL TUNNEL RELEASE Right 1980's   CARPAL TUNNEL RELEASE Left 05/05/2016   Procedure: CARPAL TUNNEL RELEASE;  Surgeon: Kennedy Bucker, MD;  Location: [facility];  Service: Orthopedics;  Laterality: Left;   CATARACT EXTRACTION W/PHACO Left 05/12/2015   Procedure: CATARACT EXTRACTION PHACO AND INTRAOCULAR LENS PLACEMENT (IOC);  Surgeon: Galen Manila, MD;  Location: [facility];  Service: Ophthalmology;  Laterality: Left;  [place] 01:53.1AP% 21.4CDE  24.58fluid pack lot # [id number]   CATARACT EXTRACTION W/PHACO Right 07/02/2019   Procedure: CATARACT EXTRACTION PHACO AND INTRAOCULAR LENS PLACEMENT (IOC) RIGHT DIABETIC  01:53.2  18.9%  21.43;  Surgeon: Galen Manila, MD;  Location: [facility];  Service: Ophthalmology;  Laterality: Right;  Diabetic - insulin   EYE SURGERY Left    cataract extractions   INCISION AND DRAINAGE OF WOUND Left 1988   "staph infection" left leg   INTRAMEDULLARY (IM) NAIL INTERTROCHANTERIC Right 01/22/2018   Procedure: INTRAMEDULLARY (IM) NAIL INTERTROCHANTRIC;  Surgeon: Juanell Fairly, MD;  Location: [facility];  Service: Orthopedics;  Laterality: Right;   KNEE ARTHROPLASTY Left 12/28/2015   Procedure: COMPUTER ASSISTED TOTAL KNEE ARTHROPLASTY;  Surgeon: Donato Heinz, MD;  Location: [facility];  Service: Orthopedics;  Laterality: Left;   ORIF WRIST FRACTURE Left 05/05/2016   Procedure: OPEN REDUCTION INTERNAL FIXATION (ORIF) WRIST FRACTURE;  Surgeon: Kennedy Bucker, MD;  Location: [facility];  Service: Orthopedics;  Laterality: Left;    Medical History: Past Medical History:  Diagnosis Date   Agent orange exposure    [place] war exposure    Arthritis    "left knee" (03/18/2014)   Closed pelvic fracture (HCC) 03/18/2014   "multiple S/P fall w/loss of consciousness; CBG 29"   Encephalopathy acute 08/16/2013   GERD (gastroesophageal reflux disease)    High cholesterol    History of use of hearing aid in both ears    does not currently wear   Hypertension    Hypothyroidism    Type II diabetes mellitus (HCC)     Family History: Family History  Problem Relation Age of Onset   Colon cancer Mother    Cerebral aneurysm Father    Prostate cancer Neg Hx    Bladder Cancer Neg Hx    Kidney cancer Neg Hx     Social History   Socioeconomic History   Marital status: Married    Spouse name: Not on file   Number of children: Not on file   Years of education: Not on file   Highest education level: Not on  file  Occupational History   Not on file  Tobacco Use   Smoking status: Former    Packs/day: 3.00    Years: 30.00    Pack years: 90.00    Types: Cigarettes    Quit date: 103    Years since quitting: 34.7   Smokeless tobacco: Never   Tobacco comments:    "quit smoking in 1988"  Vaping Use   Vaping Use: Never used  Substance and Sexual Activity   Alcohol use: No   Drug use: No   Sexual activity: Never  Other Topics Concern   Not on file  Social History Narrative   Not on file   Social Determinants of  Health   Financial Resource Strain: Not on file  Food Insecurity: Not on file  Transportation Needs: Not on file  Physical Activity: Not on file  Stress: Not on file  Social Connections: Not on file  Intimate Partner Violence: Not on file      Review of Systems  Constitutional:  Negative for activity change, appetite change, chills, fatigue, fever and unexpected weight change.  HENT:  Negative for congestion, ear pain, rhinorrhea, sneezing, sore throat and trouble swallowing.   Eyes: Negative.  Negative for redness.  Respiratory: Negative.  Negative for cough, chest tightness, shortness of breath and wheezing.   Cardiovascular: Negative.  Negative for chest pain and palpitations.  Gastrointestinal: Negative.  Negative for abdominal pain, blood in stool, constipation, diarrhea, nausea and vomiting.  Endocrine: Negative.   Genitourinary: Negative.  Negative for difficulty urinating, dysuria, frequency, hematuria and urgency.  Musculoskeletal: Negative.  Negative for arthralgias, back pain, joint swelling, myalgias and neck pain.  Skin:  Positive for wound. Negative for rash.  Allergic/Immunologic: Negative.  Negative for immunocompromised state.  Neurological: Negative.  Negative for dizziness, tremors, seizures, numbness and headaches.  Hematological: Negative.  Negative for adenopathy. Does not bruise/bleed easily.  Psychiatric/Behavioral: Negative.  Negative for  agitation, behavioral problems (Depression), self-injury, sleep disturbance and suicidal ideas. The patient is not nervous/anxious.    Vital Signs: BP (!) 157/75   Pulse 70   Temp (!) 97.5 F (36.4 C)   Resp 16   Ht 5\' 11"  (1.803 m)   Wt 247 lb 9.6 oz (112.3 kg)   SpO2 97%   BMI 34.53 kg/m    Physical Exam Vitals reviewed.  Constitutional:      General: He is not in acute distress.    Appearance: Normal appearance. He is well-developed. He is obese. He is not ill-appearing or diaphoretic.  HENT:     Head: Normocephalic and atraumatic.     Right Ear: Tympanic membrane, ear canal and external ear normal.     Left Ear: Tympanic membrane, ear canal and external ear normal.     Nose: Nose normal. No congestion or rhinorrhea.     Mouth/Throat:     Mouth: Mucous membranes are moist.     Pharynx: Oropharynx is clear. No oropharyngeal exudate or posterior oropharyngeal erythema.  Eyes:     General: No scleral icterus.       Right eye: No discharge.        Left eye: No discharge.     Extraocular Movements: Extraocular movements intact.     Conjunctiva/sclera: Conjunctivae normal.     Pupils: Pupils are equal, round, and reactive to light.  Neck:     Thyroid: No thyromegaly.     Vascular: No carotid bruit or JVD.     Trachea: No tracheal deviation.  Cardiovascular:     Rate and Rhythm: Normal rate and regular rhythm.     Heart sounds: Normal heart sounds. No murmur heard.   No friction rub. No gallop.  Pulmonary:     Effort: Pulmonary effort is normal. No respiratory distress.     Breath sounds: Normal breath sounds. No stridor. No wheezing or rales.  Chest:     Chest wall: No tenderness.  Abdominal:     General: Bowel sounds are normal. There is no distension.     Palpations: Abdomen is soft. There is no mass.     Tenderness: There is no abdominal tenderness. There is no guarding or rebound.  Musculoskeletal:  General: No tenderness or deformity. Normal range of  motion.     Cervical back: Normal range of motion and neck supple.  Lymphadenopathy:     Cervical: No cervical adenopathy.  Skin:    General: Skin is warm and dry.     Capillary Refill: Capillary refill takes less than 2 seconds.     Coloration: Skin is not pale.     Findings: Signs of injury and wound present. No erythema or rash.          Comments: There are 2 abrasions on the front of the right lower leg. The wound bed is red and yellow with purulent drainage. The skin surround both wounds is erythematous and swelling is noted. Urgent referral will be needed to wound clinic.   Neurological:     Mental Status: He is alert and oriented to person, place, and time.     Cranial Nerves: No cranial nerve deficit.     Motor: No abnormal muscle tone.     Coordination: Coordination normal.     Gait: Gait normal.     Deep Tendon Reflexes: Reflexes are normal and symmetric.  Psychiatric:        Mood and Affect: Mood normal.        Behavior: Behavior normal.        Thought Content: Thought content normal.        Judgment: Judgment normal.       Assessment/Plan: 1. Encounter for general adult medical examination with abnormal findings Age-appropriate preventive screenings and vaccinations discussed, annual physical exam completed. Routine labs for health maintenance will be ordered at follow up office visit. PHM updated. Patient had a fall injury to the right lower extremity and requires urgent referral to the wound clinic. His annual physical exam was completed during this office visit but he will require follow up for hypertension, cellulitis and routine labs.   2. Cellulitis of right lower extremity Wound culture obtained and sent, urgent referral to the wound clinic. Empiric treatment with bactrim for cellulitis.  - Ambulatory referral to Wound Clinic - sulfamethoxazole-trimethoprim (BACTRIM DS) 800-160 MG tablet; Take 2 tablets by mouth 2 (two) times daily for 10 days.  Dispense: 40  tablet; Refill: 0 - Anaerobic and Aerobic Culture  3. Type 2 diabetes mellitus with hyperglycemia, with long-term current use of insulin (HCC) A1C 6.8, continue current medications as prescribed, will discuss further at follow up visit.  - POCT HgB A1C  4. Essential hypertension Blood pressure elevated today, patient reports he did not take his medications, reminded patient to take his medications prior to his schedule office visit, will recheck at follow up visit.   5. Mixed hyperlipidemia Taking atorvastatin 40 mg daily, will recheck lipid panel with follow up office visit.   6. Dysuria Routine urinalysis done - UA/M w/rflx Culture, Routine - Microscopic Examination      General Counseling: gamaliel charney understanding of the findings of todays visit and agrees with plan of treatment. I have discussed any further diagnostic evaluation that may be needed or ordered today. We also reviewed his medications today. he has been encouraged to call the office with any questions or concerns that should arise related to todays visit.    Orders Placed This Encounter  Procedures   Anaerobic and Aerobic Culture   Microscopic Examination   UA/M w/rflx Culture, Routine   Ambulatory referral to Wound Clinic   POCT HgB A1C    Meds ordered this encounter  Medications   sulfamethoxazole-trimethoprim (  BACTRIM DS) 800-160 MG tablet    Sig: Take 2 tablets by mouth 2 (two) times daily for 10 days.    Dispense:  40 tablet    Refill:  0    Return in about 1 week (around 06/30/2021) for F/U cellulitis, Artina Minella PCP also need EKG.   Total time spent:45 Minutes Time spent includes review of chart, medications, test results, and follow up plan with the patient.  Today's visit required critical thinking and high level complex decision making. Dr. Beverely Risen was directly involved in his care.   [place] Controlled Substance Database was reviewed by me.  This patient was seen by Sallyanne Kuster,  FNP-C in collaboration with Dr. Beverely Risen as a part of collaborative care agreement.  Revanth Neidig R. Tedd Sias, MSN, FNP-C Internal medicine

## 2021-06-24 LAB — UA/M W/RFLX CULTURE, ROUTINE
Bilirubin, UA: NEGATIVE
Glucose, UA: NEGATIVE
Ketones, UA: NEGATIVE
Leukocytes,UA: NEGATIVE
Nitrite, UA: NEGATIVE
Protein,UA: NEGATIVE
RBC, UA: NEGATIVE
Specific Gravity, UA: 1.02 (ref 1.005–1.030)
Urobilinogen, Ur: 0.2 mg/dL (ref 0.2–1.0)
pH, UA: 5 (ref 5.0–7.5)

## 2021-06-24 LAB — MICROSCOPIC EXAMINATION
Bacteria, UA: NONE SEEN
Casts: NONE SEEN /lpf
Epithelial Cells (non renal): NONE SEEN /hpf (ref 0–10)
RBC, Urine: NONE SEEN /hpf (ref 0–2)
WBC, UA: NONE SEEN /hpf (ref 0–5)

## 2021-06-28 NOTE — Progress Notes (Signed)
[name], Nickoles R. ([id number]) Visit Report for 06/23/2021 Allergy List Details Patient Name: [name], Tony R. Date of Service: 06/23/2021 3:00 PM Medical Record Number: [id number] Patient Account Number: [id number] Date of Birth/Sex: 11-Apr-1941 [age] y.o. M) Treating RN: Yevonne PaxEpps, Carrie Primary Care Breck Hollinger: Beverely RisenKhan, Fozia Other Clinician: Referring Ester Hilley: Sallyanne KusterAbernathy, Alyssa Treating Bryann Mcnealy/Extender: Tilda FrancoHoffman, Jessica Weeks in Treatment: 0 Allergies Active Allergies Novolog U-100 Insulin aspart penicillin Allergy Notes Electronic Signature(s) Signed: 06/28/2021 1:47:40 PM By: Yevonne PaxEpps, Carrie RN Entered By: Yevonne PaxEpps, Carrie on 06/23/2021 15:18:15 Plourde, Tony LewisNALD R. ([id number]) -------------------------------------------------------------------------------- Arrival Information Details Patient Name: Tony BucyDSON, Tony R. Date of Service: 06/23/2021 3:00 PM Medical Record Number: [id number] Patient Account Number: [id number] Date of Birth/Sex: 11-Apr-1941 [age] y.o. M) Treating RN: Yevonne PaxEpps, Carrie Primary Care Jemuel Laursen: Beverely RisenKhan, Fozia Other Clinician: Referring Kayna Suppa: Sallyanne KusterAbernathy, Alyssa Treating Lavoris Sparling/Extender: Tilda FrancoHoffman, Jessica Weeks in Treatment: 0 Visit Information Patient Arrived: Ambulatory Arrival Time: 15:06 Accompanied By: wife Transfer Assistance: None Patient Identification Verified: Yes Secondary Verification Process Completed: Yes Patient Requires Transmission-Based Precautions: No Patient Has Alerts: Yes Patient Alerts: non compressable Electronic Signature(s) Signed: 06/28/2021 1:47:40 PM By: Yevonne PaxEpps, Carrie RN Entered By: Yevonne PaxEpps, Carrie on 06/23/2021 15:47:07 [name], Tony LewisNALD R. ([id number]) -------------------------------------------------------------------------------- Clinic Level of Care Assessment Details Patient Name: Tony BucyDSON, Tony R. Date of Service: 06/23/2021 3:00 PM Medical Record Number: [id number] Patient Account Number: [id number] Date of Birth/Sex: 11-Apr-1941 [age] y.o.  M) Treating RN: Yevonne PaxEpps, Carrie Primary Care Adil Tugwell: Beverely RisenKhan, Fozia Other Clinician: Referring Miliano Cotten: Sallyanne KusterAbernathy, Alyssa Treating Beronica Lansdale/Extender: Tilda FrancoHoffman, Jessica Weeks in Treatment: 0 Clinic Level of Care Assessment Items TOOL 2 Quantity Score X - Use when only an EandM is performed on the INITIAL visit 1 0 ASSESSMENTS - Nursing Assessment / Reassessment X - General Physical Exam (combine w/ comprehensive assessment (listed just below) when performed on new 1 20 pt. evals) X- 1 25 Comprehensive Assessment (HX, ROS, Risk Assessments, Wounds Hx, etc.) ASSESSMENTS - Wound and Skin Assessment / Reassessment []  - Simple Wound Assessment / Reassessment - one wound 0 X- 2 5 Complex Wound Assessment / Reassessment - multiple wounds []  - 0 Dermatologic / Skin Assessment (not related to wound area) ASSESSMENTS - Ostomy and/or Continence Assessment and Care []  - Incontinence Assessment and Management 0 []  - 0 Ostomy Care Assessment and Management (repouching, etc.) PROCESS - Coordination of Care X - Simple Patient / Family Education for ongoing care 1 15 []  - 0 Complex (extensive) Patient / Family Education for ongoing care X- 1 10 Staff obtains ChiropractorConsents, Records, Test Results / Process Orders []  - 0 Staff telephones HHA, Nursing Homes / Clarify orders / etc []  - 0 Routine Transfer to another Facility (non-emergent condition) []  - 0 Routine Hospital Admission (non-emergent condition) []  - 0 New Admissions / Manufacturing engineernsurance Authorizations / Ordering NPWT, Apligraf, etc. X- 1 20 Emergency Hospital Admission (emergent condition) X- 1 10 Simple Discharge Coordination []  - 0 Complex (extensive) Discharge Coordination PROCESS - Special Needs []  - Pediatric / Minor Patient Management 0 []  - 0 Isolation Patient Management []  - 0 Hearing / Language / Visual special needs []  - 0 Assessment of Community assistance (transportation, D/C planning, etc.) []  - 0 Additional assistance / Altered  mentation []  - 0 Support Surface(s) Assessment (bed, cushion, seat, etc.) INTERVENTIONS - Wound Cleansing / Measurement X - Wound Imaging (photographs - any number of wounds) 1 5 []  - 0 Wound Tracing (instead of photographs) []  - 0 Simple Wound Measurement - one wound X- 2 5 Complex Wound Measurement - multiple wounds Dever, Tatem R. ([id number]) []  -  0 Simple Wound Cleansing - one wound X- 1 5 Complex Wound Cleansing - multiple wounds INTERVENTIONS - Wound Dressings []  - Small Wound Dressing one or multiple wounds 0 X- 2 15 Medium Wound Dressing one or multiple wounds []  - 0 Large Wound Dressing one or multiple wounds []  - 0 Application of Medications - injection INTERVENTIONS - Miscellaneous []  - External ear exam 0 []  - 0 Specimen Collection (cultures, biopsies, blood, body fluids, etc.) []  - 0 Specimen(s) / Culture(s) sent or taken to Lab for analysis []  - 0 Patient Transfer (multiple staff / / Similar devices) []  - 0 Simple Staple / Suture removal (25 or less) []  - 0 Complex Staple / Suture removal (26 or more) []  - 0 Hypo / Hyperglycemic Management (close monitor of Blood Glucose) X- 1 15 Ankle / Brachial Index (ABI) - do not check if billed separately Has the patient been seen at the hospital within the last three years: Yes Total Score: 175 Level Of Care: New/Established - Level 5 Electronic Signature(s) Signed: 06/28/2021 1:47:40 PM By: RN Entered By: on 06/23/2021 16:17:30 Zakrzewski, ( ) -------------------------------------------------------------------------------- Lower Extremity Assessment Details Patient Name: [name], adult. Date of Service: 06/23/2021 3:00 PM Medical Record Number: Patient Account Number: Date of Birth/Sex: October 24, 1941 (80 y.o. M) Treating RN: Yevonne Pax Primary Care Lissette Schenk: 06/25/2021 Other Clinician: Referring Payton Moder: Tony Lewis Treating  Laryah Neuser/Extender: [id number] in Treatment: 0 Edema Assessment Assessed: [Left: No] [Right: No] Edema: [Left: Ye] [Right: s] Calf Left: Right: Point of Measurement: 37 cm From Medial Instep 39 cm Ankle Left: Right: Point of Measurement: 10 cm From Medial Instep 28 cm Knee To Floor Left: Right: From Medial Instep 49 cm Vascular Assessment Pulses: Dorsalis Pedis Palpable: [Right:Yes Yes] Notes non compressable Electronic Signature(s) Signed: 06/28/2021 1:47:40 PM By: 06/25/2021 RN Entered By: [id number] on 06/23/2021 15:46:50 Bolick, 08/31/1941 ((82) -------------------------------------------------------------------------------- Multi Wound Chart Details Patient Name: Yevonne Pax Date of Service: 06/23/2021 3:00 PM Medical Record Number: Sallyanne Kuster Patient Account Number: Tilda Franco Date of Birth/Sex: 01-12-1941 (80 y.o. 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/Ulcer of the Lower N/A Extremity Extremity Comorbid History: Hypertension, Type II Diabetes Hypertension, Type II Diabetes N/A Date Acquired: 06/21/2021 06/21/2021 N/A Weeks of Treatment: 0 0 N/A Wound Status: Open Open N/A Measurements L x W x D (cm) 13.2x1.1x0.2 4x2x0.2 N/A Area (cm) : 11.404 6.283 N/A Volume (cm) : 2.281 1.257 N/A Classification: Grade 2 Grade 2 N/A Exudate Amount: Medium Medium N/A Exudate Type: Serosanguineous Serosanguineous N/A Exudate Color: red, brown red,  brown N/A Granulation Amount: Medium (34-66%) Medium (34-66%) N/A Granulation Quality: Red Red N/A Necrotic Amount: Medium (34-66%) Medium (34-66%) N/A Exposed Structures: Fat Layer (Subcutaneous Tissue): Fat Layer (Subcutaneous Tissue): N/A Yes Yes Fascia: No Fascia: No Tendon: No Tendon: No Muscle: No Muscle: No Joint: No Joint: No Bone: No Bone: No Epithelialization: None None N/A Debridement: Chemical/Enzymatic/Mechanical Chemical/Enzymatic/Mechanical N/A Pre-procedure Verification/Time 16:11 16:11 N/A Out Taken: Pain Control: Lidocaine 4% Topical Solution Lidocaine 4% Topical Solution N/A Instrument: Other(saline gauze) Other(saline gauze) N/A Bleeding: Minimum Minimum  N/A Hemostasis Achieved: Pressure Pressure N/A Procedural Pain: 0 0 N/A Post Procedural Pain: 0 0 N/A Debridement Treatment Procedure was tolerated well Procedure was tolerated well N/A Response: Post Debridement 13.2x1.1x0.2 4x2x0.2 N/A Measurements L x W x D (cm) Post Debridement Volume: 2.281 1.257 N/A (cm) Procedures Performed: Debridement Debridement N/A [name], [name] ([id number]) Treatment Notes Electronic Signature(s) Signed: 06/23/2021 4:40:08 PM By: Geralyn Corwin DO Entered By: Geralyn Corwin on 06/23/2021 16:24:32 Dumm, Tony Lewis ([id number]) -------------------------------------------------------------------------------- Multi-Disciplinary Care Plan Details Patient Name: [name] Date of Service: 06/23/2021 3:00 PM Medical Record Number: [id number] Patient Account Number: [id number] Date of Birth/Sex: [birth year] (80 y.o. M) Treating RN: Yevonne Pax Primary Care Chaze Hruska: Beverely Risen Other Clinician: Referring Zell Doucette: Sallyanne Kuster Treating Jeweline Reif/Extender: Tilda Franco in Treatment: 0 Active Inactive Wound/Skin Impairment Nursing Diagnoses: Knowledge deficit related to ulceration/compromised skin integrity Goals: Patient/caregiver will verbalize  understanding of skin care regimen Date Initiated: 06/23/2021 Target Resolution Date: 06/23/2021 Goal Status: Active Ulcer/skin breakdown will have a volume reduction of 30% by week 4 Date Initiated: 06/23/2021 Target Resolution Date: 07/24/2021 Goal Status: Active Ulcer/skin breakdown will have a volume reduction of 50% by week 8 Date Initiated: 06/23/2021 Target Resolution Date: 08/23/2021 Goal Status: Active Ulcer/skin breakdown will have a volume reduction of 80% by week 12 Date Initiated: 06/23/2021 Target Resolution Date: 09/23/2021 Goal Status: Active Ulcer/skin breakdown will heal within 14 weeks Date Initiated: 06/23/2021 Target Resolution Date: 10/23/2021 Goal Status: Active Interventions: Assess patient/caregiver ability to obtain necessary supplies Assess patient/caregiver ability to perform ulcer/skin care regimen upon admission and as needed Assess ulceration(s) every visit Notes: Electronic Signature(s) Signed: 06/28/2021 1:47:40 PM By: Yevonne Pax RN Entered By: Yevonne Pax on 06/23/2021 16:04:13 Lehnen, Tony Lewis ([id number]) -------------------------------------------------------------------------------- Pain Assessment Details Patient Name: [name] Date of Service: 06/23/2021 3:00 PM Medical Record Number: [id number] Patient Account Number: [id number] Date of Birth/Sex: September 01, 1941 (80 y.o. M) Treating RN: Yevonne Pax Primary Care Jasmain Ahlberg: Beverely Risen Other Clinician: Referring Penni Penado: Sallyanne Kuster Treating Lurena Naeve/Extender: Tilda Franco in Treatment: 0 Active Problems Location of Pain Severity and Description of Pain Patient Has Paino Yes Site Locations With Dressing Change: Yes Duration of the Pain. Constant / Intermittento Constant Rate the pain. Current Pain Level: 3 Worst Pain Level: 3 Least Pain Level: 0 Tolerable Pain Level: 0 Character of Pain Describe the Pain: Dull Pain Management and Medication Current Pain  Management: Medication: Yes Cold Application: No Rest: Yes Massage: No Activity: No T.E.N.S.: No Heat Application: No Leg drop or elevation: No Is the Current Pain Management Adequate: Inadequate How does your wound impact your activities of daily livingo Sleep: No Bathing: No Appetite: No Relationship With Others: No Bladder Continence: No Emotions: No Bowel Continence: No Work: No Toileting: No Drive: No Dressing: No Hobbies: No Electronic Signature(s) Signed: 06/28/2021 1:47:40 PM By: Yevonne Pax RN Entered By: Yevonne Pax on 06/23/2021 15:16:48 Strege, Tony Lewis ([id number]) -------------------------------------------------------------------------------- Patient/Caregiver Education Details Patient Name: [name] Date of Service: 06/23/2021 3:00 PM Medical Record Number: [id number] Patient Account Number: [id number] Date of Birth/Gender: Jun 11, 1941 (80 y.o. M) Treating RN: Yevonne Pax Primary Care Physician: Beverely Risen Other Clinician: Referring Physician: Sallyanne Kuster Treating Physician/Extender: Tilda Franco in Treatment: 0 Education Assessment Education Provided To: Patient Education Topics Provided Wound/Skin Impairment: Methods: Explain/Verbal Responses: State content correctly Electronic Signature(s) Signed: 06/28/2021 1:47:40 PM By: Yevonne Pax RN Entered By: Yevonne Pax on 06/23/2021 16:17:57 Randon, Tony Lewis ([id number]) -------------------------------------------------------------------------------- Wound Assessment Details Patient Name: [name] Date  of Service: 06/23/2021 3:00 PM Medical Record Number: [id number] Patient Account Number: [id number] Date of Birth/Sex: May 18, 1941 (80 y.o. M) Treating RN: Yevonne Pax Primary Care Barron Vanloan: Beverely Risen Other Clinician: Referring Bryleigh Ottaway: Sallyanne Kuster Treating Rosaelena Kemnitz/Extender: Tilda Franco in Treatment: 0 Wound Status Wound Number: 1 Primary  Etiology: Diabetic Wound/Ulcer of the Lower Extremity Wound Location: Right, Proximal Lower Leg Wound Status: Open Wounding Event: Trauma Comorbid History: Hypertension, Type II Diabetes Date Acquired: 06/21/2021 Weeks Of Treatment: 0 Clustered Wound: No Photos Wound Measurements Length: (cm) 13.2 Width: (cm) 1.1 Depth: (cm) 0.2 Area: (cm) 11.404 Volume: (cm) 2.281 % Reduction in Area: % Reduction in Volume: Epithelialization: None Tunneling: No Undermining: No Wound Description Classification: Grade 2 Exudate Amount: Medium Exudate Type: Serosanguineous Exudate Color: red, brown Foul Odor After Cleansing: No Slough/Fibrino Yes Wound Bed Granulation Amount: Medium (34-66%) Exposed Structure Granulation Quality: Red Fascia Exposed: No Necrotic Amount: Medium (34-66%) Fat Layer (Subcutaneous Tissue) Exposed: Yes Necrotic Quality: Adherent Slough Tendon Exposed: No Muscle Exposed: No Joint Exposed: No Bone Exposed: No Electronic Signature(s) Signed: 06/28/2021 1:47:40 PM By: Yevonne Pax RN Entered By: Yevonne Pax on 06/23/2021 15:42:49 Brick, Tony Lewis ([id number]) -------------------------------------------------------------------------------- Wound Assessment Details Patient Name: [name]. Date of Service: 06/23/2021 3:00 PM Medical Record Number: [id number] Patient Account Number: [id number] Date of Birth/Sex: 1941/10/05 (80 y.o. M) Treating RN: Yevonne Pax Primary Care Jancarlo Biermann: Beverely Risen Other Clinician: Referring Revecca Nachtigal: Sallyanne Kuster Treating Skyah Hannon/Extender: Tilda Franco in Treatment: 0 Wound Status Wound Number: 2 Primary Etiology: Diabetic Wound/Ulcer of the Lower Extremity Wound Location: Right, Distal Lower Leg Wound Status: Open Wounding Event: Trauma Comorbid History: Hypertension, Type II Diabetes Date Acquired: 06/21/2021 Weeks Of Treatment: 0 Clustered Wound: No Photos Wound Measurements Length: (cm) 4 Width:  (cm) 2 Depth: (cm) 0.2 Area: (cm) 6.283 Volume: (cm) 1.257 % Reduction in Area: % Reduction in Volume: Epithelialization: None Tunneling: No Undermining: No Wound Description Classification: Grade 2 Exudate Amount: Medium Exudate Type: Serosanguineous Exudate Color: red, brown Foul Odor After Cleansing: No Slough/Fibrino Yes Wound Bed Granulation Amount: Medium (34-66%) Exposed Structure Granulation Quality: Red Fascia Exposed: No Necrotic Amount: Medium (34-66%) Fat Layer (Subcutaneous Tissue) Exposed: Yes Necrotic Quality: Adherent Slough Tendon Exposed: No Muscle Exposed: No Joint Exposed: No Bone Exposed: No Electronic Signature(s) Signed: 06/28/2021 1:47:40 PM By: Yevonne Pax RN Entered By: Yevonne Pax on 06/23/2021 15:45:35 Gopal, Tony Lewis ([id number]) -------------------------------------------------------------------------------- Vitals Details Patient Name: [name] Date of Service: 06/23/2021 3:00 PM Medical Record Number: [id number] Patient Account Number: [id number] Date of Birth/Sex: 04-Jul-1941 (80 y.o. M) Treating RN: Yevonne Pax Primary Care Janay Canan: Beverely Risen Other Clinician: Referring Meet Weathington: Sallyanne Kuster Treating Merrilyn Legler/Extender: Tilda Franco in Treatment: 0 Vital Signs Time Taken: 15:17 Temperature (F): 98.4 Height (in): 71 Pulse (bpm): 102 Source: Stated Respiratory Rate (breaths/min): 18 Weight (lbs): 245 Blood Pressure (mmHg): 159/74 Body Mass Index (BMI): 34.2 Reference Range: 80 - 120 mg / dl Electronic Signature(s) Signed: 06/28/2021 1:47:40 PM By: Yevonne Pax RN Entered By: Yevonne Pax on 06/23/2021 15:17:45

## 2021-06-28 NOTE — Progress Notes (Signed)
[name], [name] ([id number]) Visit Report for 06/23/2021 Abuse/Suicide Risk Screen Details Patient Name: [name]. Date of Service: 06/23/2021 3:00 PM Medical Record Number: [id number] Patient Account Number: [id number] Date of Birth/Sex: [birth year] (80 y.o. M) Treating RN: Yevonne Pax Primary Care Jamarri Vuncannon: Beverely Risen Other Clinician: Referring Emeri Estill: Sallyanne Kuster Treating Travian Kerner/Extender: Tilda Franco in Treatment: 0 Abuse/Suicide Risk Screen Items Answer ABUSE RISK SCREEN: Has anyone close to you tried to hurt or harm you recentlyo No Do you feel uncomfortable with anyone in your familyo No Has anyone forced you do things that you didnot want to doo No Electronic Signature(s) Signed: 06/28/2021 1:47:40 PM By: Yevonne Pax RN Entered By: Yevonne Pax on 06/23/2021 15:22:16 [name] ([id number]) -------------------------------------------------------------------------------- Activities of Daily Living Details Patient Name: [name]. Date of Service: 06/23/2021 3:00 PM Medical Record Number: [id number] Patient Account Number: [id number] Date of Birth/Sex: 1941-09-22 (80 y.o. M) Treating RN: Yevonne Pax Primary Care Conrado Nance: Beverely Risen Other Clinician: Referring Kashae Carstens: Sallyanne Kuster Treating Clella Mckeel/Extender: Tilda Franco in Treatment: 0 Activities of Daily Living Items Answer Activities of Daily Living (Please select one for each item) Drive Automobile Completely Able Take Medications Completely Able Use Telephone Completely Able Care for Appearance Completely Able Use Toilet Completely Able Bath / Shower Completely Able Dress Self Completely Able Feed Self Completely Able Walk Completely Able Get In / Out Bed Completely Able Housework Completely Able Prepare Meals Completely Able Handle Money Completely Able Shop for Self Completely Able Electronic Signature(s) Signed: 06/28/2021 1:47:40 PM By: Yevonne Pax RN Entered By: Yevonne Pax on 06/23/2021 15:22:54 [name] ([id number]) -------------------------------------------------------------------------------- Education Screening Details Patient Name: [name] Date of Service: 06/23/2021 3:00 PM Medical Record Number: [id number] Patient Account Number: [id number] Date of Birth/Sex: [birth year] (80 y.o. M) Treating RN: Yevonne Pax Primary Care Tanicia Wolaver: Beverely Risen Other Clinician: Referring Erion Weightman: Sallyanne Kuster Treating Wyn Nettle/Extender: Tilda Franco in Treatment: 0 Primary Learner Assessed: Patient Learning Preferences/Education Level/Primary Language Learning Preference: Explanation Highest Education Level: College or Above Preferred Language: English Cognitive Barrier Language Barrier: No Translator Needed: No Memory Deficit: No Emotional Barrier: No Cultural/Religious Beliefs Affecting Medical Care: No Physical Barrier Impaired Vision: No Impaired Hearing: No Decreased Hand dexterity: No Knowledge/Comprehension Knowledge Level: Medium Comprehension Level: High Ability to understand written instructions: High Ability to understand verbal instructions: High Motivation Anxiety Level: Anxious Cooperation: Cooperative Education Importance: Acknowledges Need Interest in Health Problems: Asks Questions Perception: Coherent Willingness to Engage in Self-Management High Activities: Readiness to Engage in Self-Management High Activities: Electronic Signature(s) Signed: 06/28/2021 1:47:40 PM By: Yevonne Pax RN Entered By: Yevonne Pax on 06/23/2021 15:23:33 [name] ([id number]) -------------------------------------------------------------------------------- Fall Risk Assessment Details Patient Name: [name]. Date of Service: 06/23/2021 3:00 PM Medical Record Number: [id number] Patient Account Number: [id number] Date of Birth/Sex: [birth year] (80 y.o. M) Treating RN:  Yevonne Pax Primary Care Dayvian Blixt: Beverely Risen Other Clinician: Referring Ralston Venus: Sallyanne Kuster Treating Tyrone Balash/Extender: Tilda Franco in Treatment: 0 Fall Risk Assessment Items Have you had 2 or more falls in the last 12 monthso 0 Yes Have you had any fall that resulted in injury in the last 12 monthso 0 Yes FALLS RISK SCREEN History of falling - immediate or within 3 months 25 Yes Secondary diagnosis (Do you have 2 or more medical diagnoseso) 0 No Ambulatory aid None/bed rest/wheelchair/nurse 0 Yes Crutches/cane/walker 0 No Furniture 0 No Intravenous therapy Access/Saline/Heparin Lock 0 No Gait/Transferring Normal/ bed rest/ wheelchair 0 Yes Weak (short steps with  or without shuffle, stooped but able to lift head while walking, may 0 No seek support from furniture) Impaired (short steps with shuffle, may have difficulty arising from chair, head down, impaired 0 No balance) Mental Status Oriented to own ability 0 Yes Electronic Signature(s) Signed: 06/28/2021 1:47:40 PM By: Yevonne Pax RN Entered By: Yevonne Pax on 06/23/2021 15:24:25 [name] ([id number]) -------------------------------------------------------------------------------- Foot Assessment Details Patient Name: [name]. Date of Service: 06/23/2021 3:00 PM Medical Record Number: [id number] Patient Account Number: [id number] Date of Birth/Sex: September 26, 1941 (80 y.o. M) Treating RN: Yevonne Pax Primary Care Daxx Tiggs: Beverely Risen Other Clinician: Referring Drina Jobst: Sallyanne Kuster Treating Liahm Grivas/Extender: Tilda Franco in Treatment: 0 Foot Assessment Items Site Locations + = Sensation present, - = Sensation absent, C = Callus, U = Ulcer R = Redness, W = Warmth, M = Maceration, PU = Pre-ulcerative lesion F = Fissure, S = Swelling, D = Dryness Assessment Right: Left: Other Deformity: No No Prior Foot Ulcer: No No Prior Amputation: No No Charcot Joint: No  No Ambulatory Status: Ambulatory Without Help Gait: Steady Electronic Signature(s) Signed: 06/28/2021 1:47:40 PM By: Yevonne Pax RN Entered By: Yevonne Pax on 06/23/2021 15:27:14 [name] ([id number]) -------------------------------------------------------------------------------- Nutrition Risk Screening Details Patient Name: [name]. Date of Service: 06/23/2021 3:00 PM Medical Record Number: [id number] Patient Account Number: [id number] Date of Birth/Sex: May 12, 1941 (80 y.o. M) Treating RN: Yevonne Pax Primary Care Euva Rundell: Beverely Risen Other Clinician: Referring Darrion Macaulay: Sallyanne Kuster Treating Creta Dorame/Extender: Tilda Franco in Treatment: 0 Height (in): 71 Weight (lbs): 245 Body Mass Index (BMI): 34.2 Nutrition Risk Screening Items Score Screening NUTRITION RISK SCREEN: I have an illness or condition that made me change the kind and/or amount of food I eat 0 No I eat fewer than two meals per day 0 No I eat few fruits and vegetables, or milk products 0 No I have three or more drinks of beer, liquor or wine almost every day 0 No I have tooth or mouth problems that make it hard for me to eat 0 No I don't always have enough money to buy the food I need 0 No I eat alone most of the time 0 No I take three or more different prescribed or over-the-counter drugs a day 1 Yes Without wanting to, I have lost or gained 10 pounds in the last six months 0 No I am not always physically able to shop, cook and/or feed myself 0 No Nutrition Protocols Good Risk Protocol 0 No interventions needed Moderate Risk Protocol High Risk Proctocol Risk Level: Good Risk Score: 1 Electronic Signature(s) Signed: 06/28/2021 1:47:40 PM By: Yevonne Pax RN Entered By: Yevonne Pax on 06/23/2021 15:24:52

## 2021-06-28 NOTE — Progress Notes (Signed)
AXZEL, [name] ([id number]) Visit Report for 06/23/2021 Chief Complaint Document Details Patient Name: [name], [name]. Date of Service: 06/23/2021 3:00 PM Medical Record Number: [id number] Patient Account Number: [id number] Date of Birth/Sex: January 10, 1941 (80 y.o. M) Treating RN: Carlene Coria Primary Care Provider: Clayborn Bigness Other Clinician: Referring Provider: Jonetta Osgood Treating Provider/Extender: Yaakov Guthrie in Treatment: 0 Information Obtained from: Patient Chief Complaint Right lower extremity trauma wound Electronic Signature(s) Signed: 06/23/2021 4:40:08 PM By: Kalman Shan DO Entered By: Kalman Shan on 06/23/2021 16:26:39 [name], [name] ([id number]) -------------------------------------------------------------------------------- Debridement Details Patient Name: [name] Date of Service: 06/23/2021 3:00 PM Medical Record Number: [id number] Patient Account Number: [id number] Date of Birth/Sex: Sep 07, 1941 (80 y.o. M) Treating RN: Carlene Coria Primary Care Provider: Clayborn Bigness Other Clinician: Referring Provider: Jonetta Osgood Treating Provider/Extender: Yaakov Guthrie in Treatment: 0 Debridement Performed for Wound #1 Right,Proximal Lower Leg Assessment: Performed By: Physician Kalman Shan, MD Debridement Type: Chemical/Enzymatic/Mechanical Agent Used: saline and gauze Severity of Tissue Pre Debridement: Fat layer exposed Level of Consciousness (Pre- Awake and Alert procedure): Pre-procedure Verification/Time Out Yes - 16:11 Taken: Start Time: 16:11 Pain Control: Lidocaine 4% Topical Solution Instrument: Other : saline gauze Bleeding: Minimum Hemostasis Achieved: Pressure End Time: 16:14 Procedural Pain: 0 Post Procedural Pain: 0 Response to Treatment: Procedure was tolerated well Level of Consciousness (Post- Awake and Alert procedure): Post Debridement Measurements of Total Wound Length: (cm)  13.2 Width: (cm) 1.1 Depth: (cm) 0.2 Volume: (cm) 2.281 Character of Wound/Ulcer Post Debridement: Improved Severity of Tissue Post Debridement: Fat layer exposed Post Procedure Diagnosis Same as Pre-procedure Electronic Signature(s) Signed: 06/23/2021 4:40:08 PM By: Kalman Shan DO Signed: 06/28/2021 1:47:40 PM By: Carlene Coria RN Entered By: Carlene Coria on 06/23/2021 16:12:29 [name] ([id number]) -------------------------------------------------------------------------------- Debridement Details Patient Name: [name]. Date of Service: 06/23/2021 3:00 PM Medical Record Number: [id number] Patient Account Number: [id number] Date of Birth/Sex: 1941/04/11 (80 y.o. M) Treating RN: Carlene Coria Primary Care Provider: Clayborn Bigness Other Clinician: Referring Provider: Jonetta Osgood Treating Provider/Extender: Yaakov Guthrie in Treatment: 0 Debridement Performed for Wound #2 Right,Distal Lower Leg Assessment: Performed By: Physician Kalman Shan, MD Debridement Type: Chemical/Enzymatic/Mechanical Agent Used: saline and gauze Severity of Tissue Pre Debridement: Fat layer exposed Level of Consciousness (Pre- Awake and Alert procedure): Pre-procedure Verification/Time Out Yes - 16:11 Taken: Start Time: 16:11 Pain Control: Lidocaine 4% Topical Solution Instrument: Other : saline gauze Bleeding: Minimum Hemostasis Achieved: Pressure End Time: 16:14 Procedural Pain: 0 Post Procedural Pain: 0 Response to Treatment: Procedure was tolerated well Level of Consciousness (Post- Awake and Alert procedure): Post Debridement Measurements of Total Wound Length: (cm) 4 Width: (cm) 2 Depth: (cm) 0.2 Volume: (cm) 1.257 Character of Wound/Ulcer Post Debridement: Improved Severity of Tissue Post Debridement: Fat layer exposed Post Procedure Diagnosis Same as Pre-procedure Electronic Signature(s) Signed: 06/23/2021 4:40:08 PM By: Kalman Shan  DO Signed: 06/28/2021 1:47:40 PM By: Carlene Coria RN Entered By: Carlene Coria on 06/23/2021 16:13:03 [name], [name] ([id number]) -------------------------------------------------------------------------------- HPI Details Patient Name: [name]. Date of Service: 06/23/2021 3:00 PM Medical Record Number: [id number] Patient Account Number: [id number] Date of Birth/Sex: 1941-06-20 (80 y.o. M) Treating RN: Carlene Coria Primary Care Provider: Clayborn Bigness Other Clinician: Referring Provider: Jonetta Osgood Treating Provider/Extender: Yaakov Guthrie in Treatment: 0 History of Present Illness HPI Description: Admission 8/24 [name] is an 80 year old male with a past medical history of insulin-dependent type 2 diabetes, essential hypertension, and total knee arthroplasty that presents to the clinic  for A 2-day history of lower extremity wound that has developed redness to the surrounding wound bed. He states that the redness started in the past day. He states he was evaluated today by his primary care provider for a wellness visit and the wound was evaluated and he was started on Bactrim. He states he has taken a dose already. He denies drainage or pain. He currently denies systemic signs of infection. Electronic Signature(s) Signed: 06/23/2021 4:40:08 PM By: Kalman Shan DO Entered By: Kalman Shan on 06/23/2021 16:32:30 [name], [name] ([id number]) -------------------------------------------------------------------------------- Physical Exam Details Patient Name: [name]. Date of Service: 06/23/2021 3:00 PM Medical Record Number: [id number] Patient Account Number: [id number] Date of Birth/Sex: Dec 08, 1940 (80 y.o. M) Treating RN: Carlene Coria Primary Care Provider: Clayborn Bigness Other Clinician: Referring Provider: Jonetta Osgood Treating Provider/Extender: Yaakov Guthrie in Treatment: 0 Constitutional . Psychiatric . Notes Right lower  extremity: Increased warmth and erythema to the lower extremity. 2 open wounds with nonviable tissue and granulation tissue present. 3+ pitting edema to the knee. Denies calf tenderness. Serous drainage noted Electronic Signature(s) Signed: 06/23/2021 4:40:08 PM By: Kalman Shan DO Entered By: Kalman Shan on 06/23/2021 16:32:44 [name], [name] ([id number]) -------------------------------------------------------------------------------- Physician Orders Details Patient Name: [name]. Date of Service: 06/23/2021 3:00 PM Medical Record Number: [id number] Patient Account Number: [id number] Date of Birth/Sex: 07-12-1941 (80 y.o. M) Treating RN: Carlene Coria Primary Care Provider: Clayborn Bigness Other Clinician: Referring Provider: Jonetta Osgood Treating Provider/Extender: Yaakov Guthrie in Treatment: 0 Verbal / Phone Orders: No Diagnosis Coding ICD-10 Coding Code Description E11.9 Type 2 diabetes mellitus without complications H67 Essential (primary) hypertension N18.30 Chronic kidney disease, stage 3 unspecified Z96.659 Presence of unspecified artificial knee joint Follow-up Appointments o Return Appointment in 1 week. Bathing/ Shower/ Hygiene o May shower with wound dressing protected with water repellent cover or cast protector. Edema Control - Lymphedema / Segmental Compressive Device / Other o Elevate, Exercise Daily and Avoid Standing for Long Periods of Time. o Elevate legs to the level of the heart and pump ankles as often as possible o Elevate leg(s) parallel to the floor when sitting. Wound Treatment Wound #1 - Lower Leg Wound Laterality: Right, Proximal Cleanser: Byram Ancillary Kit - 15 Day Supply (DME) (Generic) 1 x Per Day/30 Days Discharge Instructions: Use supplies as instructed; Kit contains: (15) Saline Bullets; (15) 3x3 Gauze; 15 pr Gloves Cleanser: Normal Saline (DME) (Generic) 1 x Per Day/30 Days Discharge Instructions: Wash  your hands with soap and water. Remove old dressing, discard into plastic bag and place into trash. Cleanse the wound with Normal Saline prior to applying a clean dressing using gauze sponges, not tissues or cotton balls. Do not scrub or use excessive force. Pat dry using gauze sponges, not tissue or cotton balls. Primary Dressing: Hydrofera Blue Ready Transfer Foam, 4x5 (in/in) (DME) (Generic) 1 x Per Day/30 Days Discharge Instructions: Apply Hydrofera Blue Ready to wound bed as directed Secondary Dressing: Zetuvit Plus Silicone Border Dressing 5x5 (in/in) (DME) (Generic) 1 x Per Day/30 Days Wound #2 - Lower Leg Wound Laterality: Right, Distal Cleanser: Byram Ancillary Kit - 15 Day Supply (DME) (Generic) 1 x Per Day/30 Days Discharge Instructions: Use supplies as instructed; Kit contains: (15) Saline Bullets; (15) 3x3 Gauze; 15 pr Gloves Cleanser: Normal Saline (DME) (Generic) 1 x Per Day/30 Days Discharge Instructions: Wash your hands with soap and water. Remove old dressing, discard into plastic bag and place into trash. Cleanse the wound with Normal Saline prior  to applying a clean dressing using gauze sponges, not tissues or cotton balls. Do not scrub or use excessive force. Pat dry using gauze sponges, not tissue or cotton balls. Primary Dressing: Hydrofera Blue Ready Transfer Foam, 4x5 (in/in) (DME) (Generic) 1 x Per Day/30 Days Discharge Instructions: Apply Hydrofera Blue Ready to wound bed as directed Secondary Dressing: Zetuvit Plus Silicone Border Dressing 5x5 (in/in) (DME) (Generic) 1 x Per Day/30 Days Consults o Vascular - Vein and vascular, ABI's and TBI's - (ICD10 E11.9 - Type 2 diabetes mellitus without complications) Tony, Joseph ([id number]) Electronic Signature(s) Signed: 06/23/2021 4:40:08 PM By: Kalman Shan DO Signed: 06/28/2021 1:47:40 PM By: Carlene Coria RN Entered By: Carlene Coria on 06/23/2021 16:18:48 [name], [name]  ([id number]) -------------------------------------------------------------------------------- Problem List Details Patient Name: ALWIN, LANIGAN. Date of Service: 06/23/2021 3:00 PM Medical Record Number: [id number] Patient Account Number: [id number] Date of Birth/Sex: 17-Sep-1941 (80 y.o. M) Treating RN: Carlene Coria Primary Care Provider: Clayborn Bigness Other Clinician: Referring Provider: Jonetta Osgood Treating Provider/Extender: Yaakov Guthrie in Treatment: 0 Active Problems ICD-10 Encounter Code Description Active Date MDM Diagnosis S81.801A Unspecified open wound, right lower leg, initial encounter 06/23/2021 No Yes E11.9 Type 2 diabetes mellitus without complications 11/05/2692 No Yes I10 Essential (primary) hypertension 06/23/2021 No Yes N18.30 Chronic kidney disease, stage 3 unspecified 06/23/2021 No Yes Z96.659 Presence of unspecified artificial knee joint 06/23/2021 No Yes Inactive Problems Resolved Problems Electronic Signature(s) Signed: 06/23/2021 4:40:08 PM By: Kalman Shan DO Entered By: Kalman Shan on 06/23/2021 16:24:14 [name], [name] ([id number]) -------------------------------------------------------------------------------- Progress Note Details Patient Name: [name]. Date of Service: 06/23/2021 3:00 PM Medical Record Number: [id number] Patient Account Number: [id number] Date of Birth/Sex: 03/17/1941 (80 y.o. M) Treating RN: Carlene Coria Primary Care Provider: Clayborn Bigness Other Clinician: Referring Provider: Jonetta Osgood Treating Provider/Extender: Yaakov Guthrie in Treatment: 0 Subjective Chief Complaint Information obtained from Patient Right lower extremity trauma wound History of Present Illness (HPI) Admission 8/24 Elih Mooney is an 80 year old male with a past medical history of insulin-dependent type 2 diabetes, essential hypertension, and total knee arthroplasty that presents to the clinic for A 2-day history  of lower extremity wound that has developed redness to the surrounding wound bed. He states that the redness started in the past day. He states he was evaluated today by his primary care provider for a wellness visit and the wound was evaluated and he was started on Bactrim. He states he has taken a dose already. He denies drainage or pain. He currently denies systemic signs of infection. Patient History Information obtained from Patient. Allergies Novolog U-100 Insulin aspart, penicillin Social History Former smoker, Marital Status - Married, Alcohol Use - Rarely, Drug Use - No History, Caffeine Use - Daily. Medical History Cardiovascular Patient has history of Hypertension Endocrine Patient has history of Type II Diabetes Patient is treated with Insulin, Oral Agents. Blood sugar is not tested. Review of Systems (ROS) Eyes Complains or has symptoms of Vision Changes. Genitourinary Complains or has symptoms of Kidney failure/ Dialysis. Integumentary (Skin) Complains or has symptoms of Wounds. Objective Constitutional Vitals Time Taken: 3:17 PM, Height: 71 in, Source: Stated, Weight: 245 lbs, BMI: 34.2, Temperature: 98.4 F, Pulse: 102 bpm, Respiratory Rate: 18 breaths/min, Blood Pressure: 159/74 mmHg. General Notes: Right lower extremity: Increased warmth and erythema to the lower extremity. 2 open wounds with nonviable tissue and granulation tissue present. 3+ pitting edema to the knee. Denies calf tenderness. Serous drainage noted Integumentary (Hair, Skin) Wound #1 status is  Open. Original cause of wound was Trauma. The date acquired was: 06/21/2021. The wound is located on the Right,Proximal Lower Leg. The wound measures 13.2cm length x 1.1cm width x 0.2cm depth; 11.404cm^2 area and 2.281cm^3 volume. There is Fat Layer (Subcutaneous Tissue) exposed. There is no tunneling or undermining noted. There is a medium amount of serosanguineous drainage noted. There is medium (34-66%) red  granulation within the wound bed. There is a medium (34-66%) amount of necrotic tissue within the wound bed including Adherent Slough. [name], [name] ([id number]) Wound #2 status is Open. Original cause of wound was Trauma. The date acquired was: 06/21/2021. The wound is located on the Right,Distal Lower Leg. The wound measures 4cm length x 2cm width x 0.2cm depth; 6.283cm^2 area and 1.257cm^3 volume. There is Fat Layer (Subcutaneous Tissue) exposed. There is no tunneling or undermining noted. There is a medium amount of serosanguineous drainage noted. There is medium (34-66%) red granulation within the wound bed. There is a medium (34-66%) amount of necrotic tissue within the wound bed including Adherent Slough. Assessment Active Problems ICD-10 Unspecified open wound, right lower leg, initial encounter Type 2 diabetes mellitus without complications Essential (primary) hypertension Chronic kidney disease, stage 3 unspecified Presence of unspecified artificial knee joint Patient presents with a 2-day history of open wounds to his right lower extremity due to trauma. He has increased warmth and erythema indicative of cellulitis to the lower extremity. He just started Bactrim today given by his primary office. He was referred to our office today for further assessment. At this time I recommended continuing Bactrim and if there is no improvement in the next 2 to 3 days he would need to be evaluated in the ED for IV antibiotics. He currently feels well and his vitals are stable. No concern for clots at this time with no calf tenderness. He had noncompressible ABIs on exam and difficult to palpate pedal pulse. I recommended obtaining formal ABIs with TBI's. For now I recommended Hydrofera Blue to the wound beds and keep this covered. I outlined the erythema. I asked that they (wife and patient) watch the redness and if it were to spread to immediately go to the ED. Patient knows to call the clinic  with any questions or concerns. 47 minutes was spent on the encounter including face-to-face, EMR review and coordination of care. Procedures Wound #1 Pre-procedure diagnosis of Wound #1 is a Diabetic Wound/Ulcer of the Lower Extremity located on the Right,Proximal Lower Leg .Severity of Tissue Pre Debridement is: Fat layer exposed. There was a Chemical/Enzymatic/Mechanical debridement performed by Kalman Shan, MD. With the following instrument(s): saline gauze after achieving pain control using Lidocaine 4% Topical Solution. Other agent used was saline and gauze. A time out was conducted at 16:11, prior to the start of the procedure. A Minimum amount of bleeding was controlled with Pressure. The procedure was tolerated well with a pain level of 0 throughout and a pain level of 0 following the procedure. Post Debridement Measurements: 13.2cm length x 1.1cm width x 0.2cm depth; 2.281cm^3 volume. Character of Wound/Ulcer Post Debridement is improved. Severity of Tissue Post Debridement is: Fat layer exposed. Post procedure Diagnosis Wound #1: Same as Pre-Procedure Wound #2 Pre-procedure diagnosis of Wound #2 is a Diabetic Wound/Ulcer of the Lower Extremity located on the Right,Distal Lower Leg .Severity of Tissue Pre Debridement is: Fat layer exposed. There was a Chemical/Enzymatic/Mechanical debridement performed by Kalman Shan, MD. With the following instrument(s): saline gauze after achieving pain control using Lidocaine 4% Topical  Solution. Other agent used was saline and gauze. A time out was conducted at 16:11, prior to the start of the procedure. A Minimum amount of bleeding was controlled with Pressure. The procedure was tolerated well with a pain level of 0 throughout and a pain level of 0 following the procedure. Post Debridement Measurements: 4cm length x 2cm width x 0.2cm depth; 1.257cm^3 volume. Character of Wound/Ulcer Post Debridement is improved. Severity of Tissue Post  Debridement is: Fat layer exposed. Post procedure Diagnosis Wound #2: Same as Pre-Procedure Plan Follow-up Appointments: Return Appointment in 1 week. Bathing/ Shower/ Hygiene: May shower with wound dressing protected with water repellent cover or cast protector. Edema Control - Lymphedema / Segmental Compressive Device / Other: Elevate, Exercise Daily and Avoid Standing for Long Periods of Time. [name], [name] ([id number]) Elevate legs to the level of the heart and pump ankles as often as possible Elevate leg(s) parallel to the floor when sitting. Consults ordered were: Vascular - Vein and vascular, ABI's and TBI's WOUND #1: - Lower Leg Wound Laterality: Right, Proximal Cleanser: Byram Ancillary Kit - 15 Day Supply (DME) (Generic) 1 x Per Day/30 Days Discharge Instructions: Use supplies as instructed; Kit contains: (15) Saline Bullets; (15) 3x3 Gauze; 15 pr Gloves Cleanser: Normal Saline (DME) (Generic) 1 x Per Day/30 Days Discharge Instructions: Wash your hands with soap and water. Remove old dressing, discard into plastic bag and place into trash. Cleanse the wound with Normal Saline prior to applying a clean dressing using gauze sponges, not tissues or cotton balls. Do not scrub or use excessive force. Pat dry using gauze sponges, not tissue or cotton balls. Primary Dressing: Hydrofera Blue Ready Transfer Foam, 4x5 (in/in) (DME) (Generic) 1 x Per Day/30 Days Discharge Instructions: Apply Hydrofera Blue Ready to wound bed as directed Secondary Dressing: Zetuvit Plus Silicone Border Dressing 5x5 (in/in) (DME) (Generic) 1 x Per Day/30 Days WOUND #2: - Lower Leg Wound Laterality: Right, Distal Cleanser: Byram Ancillary Kit - 15 Day Supply (DME) (Generic) 1 x Per Day/30 Days Discharge Instructions: Use supplies as instructed; Kit contains: (15) Saline Bullets; (15) 3x3 Gauze; 15 pr Gloves Cleanser: Normal Saline (DME) (Generic) 1 x Per Day/30 Days Discharge Instructions: Wash your  hands with soap and water. Remove old dressing, discard into plastic bag and place into trash. Cleanse the wound with Normal Saline prior to applying a clean dressing using gauze sponges, not tissues or cotton balls. Do not scrub or use excessive force. Pat dry using gauze sponges, not tissue or cotton balls. Primary Dressing: Hydrofera Blue Ready Transfer Foam, 4x5 (in/in) (DME) (Generic) 1 x Per Day/30 Days Discharge Instructions: Apply Hydrofera Blue Ready to wound bed as directed Secondary Dressing: Zetuvit Plus Silicone Border Dressing 5x5 (in/in) (DME) (Generic) 1 x Per Day/30 Days 1. Continue Bactrim 2. Hydrofera Blue 3. Follow-up in 1 week 4. Patient will need to go to the ED if no improvement in the next 3 days. If there is any decline he needs to go to the ED immediately for IV antibiotics Electronic Signature(s) Signed: 06/23/2021 4:40:08 PM By: Kalman Shan DO Entered By: Kalman Shan on 06/23/2021 16:39:41 [name], [name] ([id number]) -------------------------------------------------------------------------------- ROS/PFSH Details Patient Name: [name]. Date of Service: 06/23/2021 3:00 PM Medical Record Number: [id number] Patient Account Number: [id number] Date of Birth/Sex: 06/18/1941 (80 y.o. M) Treating RN: Carlene Coria Primary Care Provider: Clayborn Bigness Other Clinician: Referring Provider: Jonetta Osgood Treating Provider/Extender: Yaakov Guthrie in Treatment: 0 Information Obtained From Patient Eyes Complaints and  Symptoms: Positive for: Vision Changes Genitourinary Complaints and Symptoms: Positive for: Kidney failure/ Dialysis Integumentary (Skin) Complaints and Symptoms: Positive for: Wounds Cardiovascular Medical History: Positive for: Hypertension Endocrine Medical History: Positive for: Type II Diabetes Time with diabetes: 51 Treated with: Insulin, Oral agents Blood sugar tested every day: No Immunizations Pneumococcal  Vaccine: Received Pneumococcal Vaccination: No Implantable Devices None Family and Social History Former smoker; Marital Status - Married; Alcohol Use: Rarely; Drug Use: No History; Caffeine Use: Daily; Financial Concerns: No; Food, Clothing or Shelter Needs: No; Support System Lacking: No; Transportation Concerns: No Electronic Signature(s) Signed: 06/23/2021 4:40:08 PM By: Kalman Shan DO Signed: 06/28/2021 1:47:40 PM By: Carlene Coria RN Entered By: Carlene Coria on 06/23/2021 15:22:09 [name], [name] ([id number]) -------------------------------------------------------------------------------- SuperBill Details Patient Name: [name]. Date of Service: 06/23/2021 Medical Record Number: [id number] Patient Account Number: [id number] Date of Birth/Sex: [birth year] (80 y.o. M) Treating RN: Carlene Coria Primary Care Provider: Clayborn Bigness Other Clinician: Referring Provider: Jonetta Osgood Treating Provider/Extender: Yaakov Guthrie in Treatment: 0 Diagnosis Coding ICD-10 Codes Code Description [phone] Unspecified open wound, right lower leg, initial encounter E11.9 Type 2 diabetes mellitus without complications X93 Essential (primary) hypertension N18.30 Chronic kidney disease, stage 3 unspecified Z96.659 Presence of unspecified artificial knee joint Facility Procedures CPT4 Code: [id number] Description: 38101 - DEBRIDE W/O ANES NON SELECT Modifier: Quantity: 1 CPT4 Code: [id number] Description: 27782 - DEBRIDE W/O ANES NON SELECT Modifier: Quantity: 1 Physician Procedures CPT4 Code: 4235361 Description: 44315 - WC PHYS LEVEL 4 - NEW PT Modifier: Quantity: 1 CPT4 Code: Description: ICD-10 Diagnosis Description S81.801A Unspecified open wound, right lower leg, initial encounter E11.9 Type 2 diabetes mellitus without complications Q00 Essential (primary) hypertension N18.30 Chronic kidney disease, stage 3 unspecified Modifier: Quantity: Electronic  Signature(s) Signed: 06/23/2021 4:40:08 PM By: Kalman Shan DO Entered By: Kalman Shan on 06/23/2021 16:39:46

## 2021-06-30 ENCOUNTER — Encounter (HOSPITAL_BASED_OUTPATIENT_CLINIC_OR_DEPARTMENT_OTHER): Payer: Medicare HMO | Admitting: Internal Medicine

## 2021-06-30 ENCOUNTER — Other Ambulatory Visit: Payer: Self-pay

## 2021-06-30 ENCOUNTER — Ambulatory Visit: Payer: Medicare HMO | Admitting: Nurse Practitioner

## 2021-06-30 DIAGNOSIS — I10 Essential (primary) hypertension: Secondary | ICD-10-CM | POA: Diagnosis not present

## 2021-06-30 DIAGNOSIS — E1122 Type 2 diabetes mellitus with diabetic chronic kidney disease: Secondary | ICD-10-CM | POA: Diagnosis not present

## 2021-06-30 DIAGNOSIS — E119 Type 2 diabetes mellitus without complications: Secondary | ICD-10-CM | POA: Diagnosis not present

## 2021-06-30 DIAGNOSIS — L97919 Non-pressure chronic ulcer of unspecified part of right lower leg with unspecified severity: Secondary | ICD-10-CM | POA: Diagnosis not present

## 2021-06-30 DIAGNOSIS — E1151 Type 2 diabetes mellitus with diabetic peripheral angiopathy without gangrene: Secondary | ICD-10-CM | POA: Diagnosis not present

## 2021-06-30 DIAGNOSIS — N183 Chronic kidney disease, stage 3 unspecified: Secondary | ICD-10-CM | POA: Diagnosis not present

## 2021-06-30 DIAGNOSIS — S81801D Unspecified open wound, right lower leg, subsequent encounter: Secondary | ICD-10-CM | POA: Diagnosis not present

## 2021-06-30 DIAGNOSIS — E11622 Type 2 diabetes mellitus with other skin ulcer: Secondary | ICD-10-CM | POA: Diagnosis not present

## 2021-06-30 DIAGNOSIS — Z87891 Personal history of nicotine dependence: Secondary | ICD-10-CM | POA: Diagnosis not present

## 2021-06-30 DIAGNOSIS — Z96659 Presence of unspecified artificial knee joint: Secondary | ICD-10-CM | POA: Diagnosis not present

## 2021-06-30 DIAGNOSIS — L97911 Non-pressure chronic ulcer of unspecified part of right lower leg limited to breakdown of skin: Secondary | ICD-10-CM | POA: Diagnosis not present

## 2021-06-30 DIAGNOSIS — I129 Hypertensive chronic kidney disease with stage 1 through stage 4 chronic kidney disease, or unspecified chronic kidney disease: Secondary | ICD-10-CM | POA: Diagnosis not present

## 2021-06-30 DIAGNOSIS — Z794 Long term (current) use of insulin: Secondary | ICD-10-CM | POA: Diagnosis not present

## 2021-06-30 NOTE — Progress Notes (Signed)
CHEVON, LAUFER ([id number]) Visit Report for 06/30/2021 Arrival Information Details Patient Name: [name], [name]. Date of Service: 06/30/2021 8:00 AM Medical Record Number: [id number] Patient Account Number: [id number] Date of Birth/Sex: 16-Feb-1941 (80 y.o. M) Treating RN: Carlene Coria Primary Care Daneshia Tavano: Clayborn Bigness Other Clinician: Referring Harika Laidlaw: Clayborn Bigness Treating Fawaz Borquez/Extender: Yaakov Guthrie in Treatment: 1 Visit Information History Since Last Visit All ordered tests and consults were completed: No Patient Arrived: Ambulatory Added or deleted any medications: No Arrival Time: 08:02 Any new allergies or adverse reactions: No Accompanied By: wife Had a fall or experienced change in No Transfer Assistance: None activities of daily living that may affect Patient Identification Verified: Yes risk of falls: Secondary Verification Process Completed: Yes Signs or symptoms of abuse/neglect since last visito No Patient Requires Transmission-Based Precautions: No Hospitalized since last visit: No Patient Has Alerts: Yes Implantable device outside of the clinic excluding No Patient Alerts: non compressable cellular tissue based products placed in the center since last visit: Has Dressing in Place as Prescribed: Yes Pain Present Now: No Electronic Signature(s) Signed: 06/30/2021 4:57:35 PM By: Carlene Coria RN Entered By: Carlene Coria on 06/30/2021 08:08:52 [place]aphank, [name] ([id number]) -------------------------------------------------------------------------------- Clinic Level of Care Assessment Details Patient Name: [name] Date of Service: 06/30/2021 8:00 AM Medical Record Number: [id number] Patient Account Number: [id number] Date of Birth/Sex: 06-Jul-1941 (80 y.o. M) Treating RN: Carlene Coria Primary Care Desmond Tufano: Clayborn Bigness Other Clinician: Referring Ashanti Ratti: Clayborn Bigness Treating Tonnette Zwiebel/Extender: Yaakov Guthrie in Treatment:  1 Clinic Level of Care Assessment Items TOOL 4 Quantity Score X - Use when only an EandM is performed on FOLLOW-UP visit 1 0 ASSESSMENTS - Nursing Assessment / Reassessment X - Reassessment of Co-morbidities (includes updates in patient status) 1 10 X- 1 5 Reassessment of Adherence to Treatment Plan ASSESSMENTS - Wound and Skin Assessment / Reassessment _0  - Simple Wound Assessment / Reassessment - one wound 0 X- 2 5 Complex Wound Assessment / Reassessment - multiple wounds _1  - 0 Dermatologic / Skin Assessment (not related to wound area) ASSESSMENTS - Focused Assessment _2  - Circumferential Edema Measurements - multi extremities 0 _3  - 0 Nutritional Assessment / Counseling / Intervention _4  - 0 Lower Extremity Assessment (monofilament, tuning fork, pulses) _5  - 0 Peripheral Arterial Disease Assessment (using hand held doppler) ASSESSMENTS - Ostomy and/or Continence Assessment and Care _6  - Incontinence Assessment and Management 0 _7  - 0 Ostomy Care Assessment and Management (repouching, etc.) PROCESS - Coordination of Care X - Simple Patient / Family Education for ongoing care 1 15 _8  - 0 Complex (extensive) Patient / Family Education for ongoing care _9  - 0 Staff obtains Programmer, systems, Records, Test Results / Process Orders _10  - 0 Staff telephones HHA, Nursing Homes / Clarify orders / etc _11  - 0 Routine Transfer to another Facility (non-emergent condition) _12  - 0 Routine Hospital Admission (non-emergent condition) _13  - 0 New Admissions / Biomedical engineer / Ordering NPWT, Apligraf, etc. _14  - 0 Emergency Hospital Admission (emergent condition) X- 1 10 Simple Discharge Coordination _15  - 0 Complex (extensive) Discharge Coordination PROCESS - Special Needs _16  - Pediatric / Minor Patient Management 0 _17  - 0 Isolation Patient Management _18  - 0 Hearing / Language / Visual special needs _19  - 0 Assessment of Community assistance (transportation, D/C planning, etc.) _20   - 0 Additional assistance / Altered mentation _21  - 0 Support Surface(s) Assessment (bed, cushion, seat, etc.) INTERVENTIONS - Wound Cleansing / Measurement Osley, Kerby R. ([id number]) _22  - 0 Simple  Wound Cleansing - one wound X- 2 5 Complex Wound Cleansing - multiple wounds X- 1 5 Wound Imaging (photographs - any number of wounds) _0  - 0 Wound Tracing (instead of photographs) _1  - 0 Simple Wound Measurement - one wound X- 2 5 Complex Wound Measurement - multiple wounds INTERVENTIONS - Wound Dressings X - Small Wound Dressing one or multiple wounds 2 10 _2  - 0 Medium Wound Dressing one or multiple wounds _3  - 0 Large Wound Dressing one or multiple wounds X- 1 5 Application of Medications - topical <[id number]>_6<\/[id number]>_2  - 0 Application of Medications - injection INTERVENTIONS - Miscellaneous _5  - External ear exam 0 _6  - 0 Specimen Collection (cultures, biopsies, blood, body fluids, etc.) _7  - 0 Specimen(s) / Culture(s) sent or taken to Lab for analysis _8  - 0 Patient Transfer (multiple staff / Civil Service fast streamer / Similar devices) _9  - 0 Simple Staple / Suture removal (25 or less) _10  - 0 Complex Staple / Suture removal (26 or more) _11  - 0 Hypo / Hyperglycemic Management (close monitor of Blood Glucose) _12  - 0 Ankle / Brachial Index (ABI) - do not check if billed separately X- 1 5 Vital Signs Has the patient been seen at the hospital within the last three years: Yes Total Score: 105 Level Of Care: New/Established - Level 3 Electronic Signature(s) Signed: 06/30/2021 4:57:35 PM By: Carlene Coria RN Entered By: Carlene Coria on 06/30/2021 08:37:32 Scibilia, [name] ([id number]) -------------------------------------------------------------------------------- Encounter Discharge Information Details Patient Name: [name]. Date of Service: 06/30/2021 8:00 AM Medical Record Number: [id number] Patient Account Number: [id number] Date of Birth/Sex: Jun 29, 1941 (80 y.o. M) Treating RN: Carlene Coria Primary Care Colbi Schiltz: Clayborn Bigness Other Clinician: Referring Teddy Pena: Clayborn Bigness Treating Zakira Ressel/Extender: Yaakov Guthrie in Treatment: 1 Encounter Discharge Information Items Discharge Condition: Stable Ambulatory Status: Ambulatory Discharge Destination: Home Transportation: Private Auto Accompanied By: self Schedule Follow-up Appointment: Yes Clinical Summary of Care: Patient Declined Electronic Signature(s) Signed: 06/30/2021 4:57:35 PM By: Carlene Coria RN Entered By: Carlene Coria on 06/30/2021 08:46:41 Mcginty, [name] ([id number]) -------------------------------------------------------------------------------- Lower Extremity Assessment Details Patient Name: [name]. Date of Service: 06/30/2021 8:00 AM Medical Record Number: [id number] Patient Account Number: [id number] Date of Birth/Sex: July 11, 1941 (80 y.o. M) Treating RN: Carlene Coria Primary Care Dhyana Bastone: Clayborn Bigness Other Clinician: Referring Dally Oshel: Clayborn Bigness Treating Kimora Stankovic/Extender: Yaakov Guthrie in Treatment: 1 Edema Assessment Assessed: [Left: No] [Right: No] [Left: Edema] [Right: :] Calf Left: Right: Point of Measurement: 37 cm From Medial Instep 37 cm Ankle Left: Right: Point of Measurement: 10 cm From Medial Instep 26 cm Vascular Assessment Pulses: Dorsalis Pedis Palpable: [Right:Yes] Electronic Signature(s) Signed: 06/30/2021 4:57:35 PM By: Carlene Coria RN Entered By: Carlene Coria on 06/30/2021 08:19:28 Soderholm, [name] ([id number]) -------------------------------------------------------------------------------- Multi Wound Chart Details Patient Name: [name]. Date of Service: 06/30/2021 8:00 AM Medical Record Number: [id number] Patient Account Number: [id number] Date of Birth/Sex: 03/29/1941 (80 y.o. M) Treating RN: Carlene Coria Primary Care Daanya Lanphier: Clayborn Bigness Other Clinician: Referring Lacie Landry: Clayborn Bigness Treating Lorenza Winkleman/Extender:  Yaakov Guthrie in Treatment: 1 Vital Signs Height(in): 15 Pulse(bpm): 37 Weight(lbs): 245 Blood Pressure(mmHg): 156/67 Body Mass Index(BMI): 34 Temperature(F): 99.2 Respiratory Rate(breaths/min): 18 Photos: [N/A:N/A] Wound Location: Right, Proximal Lower Leg Right, Distal Lower Leg N/A Wounding Event: Trauma Trauma N/A Primary Etiology: Diabetic Wound/Ulcer of the Lower Diabetic Wound/Ulcer of the Lower N/A Extremity Extremity Comorbid History: Hypertension, Type II Diabetes Hypertension, Type II Diabetes N/A Date Acquired: 06/21/2021 06/21/2021 N/A Weeks of Treatment: 1 1  N/A Wound Status: Open Open N/A Measurements L x W x D (cm) 11x0.9x0.1 3.3x1.4x0.1 N/A Area (cm) : 7.775 3.629 N/A Volume (cm) : 0.778 0.363 N/A % Reduction in Area: 31.80% 42.20% N/A % Reduction in Volume: 65.90% 71.10% N/A Classification: Grade 2 Grade 2 N/A Exudate Amount: Medium Medium N/A Exudate Type: Serosanguineous Serosanguineous N/A Exudate Color: red, brown red, brown N/A Granulation Amount: Large (67-100%) Medium (34-66%) N/A Granulation Quality: Red Red N/A Necrotic Amount: Small (1-33%) Medium (34-66%) N/A Exposed Structures: Fat Layer (Subcutaneous Tissue): Fat Layer (Subcutaneous Tissue): N/A Yes Yes Fascia: No Fascia: No Tendon: No Tendon: No Muscle: No Muscle: No Joint: No Joint: No Bone: No Bone: No Epithelialization: None None N/A Treatment Notes Electronic Signature(s) Signed: 06/30/2021 8:44:21 AM By: Kalman Shan DO Previous Signature: 06/30/2021 8:27:21 AM Version By: Carlene Coria RN Entered By: Kalman Shan on 06/30/2021 08:37:15 Roehrich, [name] ([id number]) -------------------------------------------------------------------------------- [place] Details Patient Name: DAMEIR, GENTZLER. Date of Service: 06/30/2021 8:00 AM Medical Record Number: [id number] Patient Account Number: [id number] Date of Birth/Sex: 01-12-1941 (80 y.o.  M) Treating RN: Carlene Coria Primary Care Reilley Valentine: Clayborn Bigness Other Clinician: Referring Timera Windt: Clayborn Bigness Treating Rivka Baune/Extender: Yaakov Guthrie in Treatment: 1 Active Inactive Wound/Skin Impairment Nursing Diagnoses: Knowledge deficit related to ulceration/compromised skin integrity Goals: Patient/caregiver will verbalize understanding of skin care regimen Date Initiated: 06/23/2021 Target Resolution Date: 07/24/2021 Goal Status: Active Ulcer/skin breakdown will have a volume reduction of 30% by week 4 Date Initiated: 06/23/2021 Target Resolution Date: 07/24/2021 Goal Status: Active Ulcer/skin breakdown will have a volume reduction of 50% by week 8 Date Initiated: 06/23/2021 Target Resolution Date: 08/23/2021 Goal Status: Active Ulcer/skin breakdown will have a volume reduction of 80% by week 12 Date Initiated: 06/23/2021 Target Resolution Date: 09/23/2021 Goal Status: Active Ulcer/skin breakdown will heal within 14 weeks Date Initiated: 06/23/2021 Target Resolution Date: 10/23/2021 Goal Status: Active Interventions: Assess patient/caregiver ability to obtain necessary supplies Assess patient/caregiver ability to perform ulcer/skin care regimen upon admission and as needed Assess ulceration(s) every visit Notes: Electronic Signature(s) Signed: 06/30/2021 8:27:13 AM By: Carlene Coria RN Entered By: Carlene Coria on 06/30/2021 08:27:12 [place], [name] ([id number]) -------------------------------------------------------------------------------- Pain Assessment Details Patient Name: [name] Date of Service: 06/30/2021 8:00 AM Medical Record Number: [id number] Patient Account Number: [id number] Date of Birth/Sex: 02-17-1941 (80 y.o. M) Treating RN: Carlene Coria Primary Care Ryka Beighley: Clayborn Bigness Other Clinician: Referring Bentlee Benningfield: Clayborn Bigness Treating Aroldo Galli/Extender: Yaakov Guthrie in Treatment: 1 Active Problems Location of Pain Severity  and Description of Pain Patient Has Paino No Site Locations Pain Management and Medication Current Pain Management: Electronic Signature(s) Signed: 06/30/2021 4:57:35 PM By: Carlene Coria RN Entered By: Carlene Coria on 06/30/2021 08:09:29 [place], [name] ([id number]) -------------------------------------------------------------------------------- Patient/Caregiver Education Details Patient Name: [name] Date of Service: 06/30/2021 8:00 AM Medical Record Number: [id number] Patient Account Number: [id number] Date of Birth/Gender: 1941/02/17 (80 y.o. M) Treating RN: Carlene Coria Primary Care Physician: Clayborn Bigness Other Clinician: Referring Physician: Clayborn Bigness Treating Physician/Extender: Yaakov Guthrie in Treatment: 1 Education Assessment Education Provided To: Patient Education Topics Provided Wound/Skin Impairment: Methods: Explain/Verbal Responses: State content correctly Electronic Signature(s) Signed: 06/30/2021 4:57:35 PM By: Carlene Coria RN Entered By: Carlene Coria on 06/30/2021 08:38:01 [place], [name] ([id number]) -------------------------------------------------------------------------------- Wound Assessment Details Patient Name: [name] Date of Service: 06/30/2021 8:00 AM Medical Record Number: [id number] Patient Account Number: [id number] Date of Birth/Sex: 11/10/1940 (80 y.o. M) Treating RN: Carlene Coria Primary Care Raysean Graumann: Clayborn Bigness Other  Clinician: Referring Ahniyah Giancola: Clayborn Bigness Treating Jaena Brocato/Extender: Yaakov Guthrie in Treatment: 1 Wound Status Wound Number: 1 Primary Etiology: Diabetic Wound/Ulcer of the Lower Extremity Wound Location: Right, Proximal Lower Leg Wound Status: Open Wounding Event: Trauma Comorbid History: Hypertension, Type II Diabetes Date Acquired: 06/21/2021 Weeks Of Treatment: 1 Clustered Wound: No Photos Wound Measurements Length: (cm) 11 Width: (cm) 0.9 Depth: (cm) 0.1 Area: (cm)  7.775 Volume: (cm) 0.778 % Reduction in Area: 31.8% % Reduction in Volume: 65.9% Epithelialization: None Tunneling: No Undermining: No Wound Description Classification: Grade 2 Exudate Amount: Medium Exudate Type: Serosanguineous Exudate Color: red, brown Foul Odor After Cleansing: No Slough/Fibrino Yes Wound Bed Granulation Amount: Large (67-100%) Exposed Structure Granulation Quality: Red Fascia Exposed: No Necrotic Amount: Small (1-33%) Fat Layer (Subcutaneous Tissue) Exposed: Yes Necrotic Quality: Adherent Slough Tendon Exposed: No Muscle Exposed: No Joint Exposed: No Bone Exposed: No Treatment Notes Wound #1 (Lower Leg) Wound Laterality: Right, Proximal Cleanser Byram Ancillary Kit - 15 Day Supply Discharge Instruction: Use supplies as instructed; Kit contains: (15) Saline Bullets; (15) 3x3 Gauze; 15 pr Gloves Normal Saline Discharge Instruction: Wash your hands with soap and water. Remove old dressing, discard into plastic bag and place into trash. Cleanse the wound with Normal Saline prior to applying a clean dressing using gauze sponges, not tissues or cotton balls. Do not Hintze, Adien R. ([id number]) scrub or use excessive force. Pat dry using gauze sponges, not tissue or cotton balls. Peri-Wound Care Topical Primary Dressing Hydrofera Blue Ready Transfer Foam, 4x5 (in/in) Discharge Instruction: Apply Hydrofera Blue Ready to wound bed as directed Santyl Collagenase Ointment, 30 (gm), tube Secondary Dressing Zetuvit Plus Silicone Border Dressing 5x5 (in/in) Secured With Compression Wrap Compression Stockings Add-Ons Electronic Signature(s) Signed: 06/30/2021 4:57:35 PM By: Carlene Coria RN Entered By: Carlene Coria on 06/30/2021 08:18:20 Gopal, [name] ([id number]) -------------------------------------------------------------------------------- Wound Assessment Details Patient Name: [name]. Date of Service: 06/30/2021 8:00 AM Medical Record  Number: [id number] Patient Account Number: [id number] Date of Birth/Sex: Jan 31, 1941 (80 y.o. M) Treating RN: Carlene Coria Primary Care Nadean Montanaro: Clayborn Bigness Other Clinician: Referring Brenda Samano: Clayborn Bigness Treating Ifeanyi Mickelson/Extender: Yaakov Guthrie in Treatment: 1 Wound Status Wound Number: 2 Primary Etiology: Diabetic Wound/Ulcer of the Lower Extremity Wound Location: Right, Distal Lower Leg Wound Status: Open Wounding Event: Trauma Comorbid History: Hypertension, Type II Diabetes Date Acquired: 06/21/2021 Weeks Of Treatment: 1 Clustered Wound: No Photos Wound Measurements Length: (cm) 3.3 Width: (cm) 1.4 Depth: (cm) 0.1 Area: (cm) 3.629 Volume: (cm) 0.363 % Reduction in Area: 42.2% % Reduction in Volume: 71.1% Epithelialization: None Tunneling: No Undermining: No Wound Description Classification: Grade 2 Exudate Amount: Medium Exudate Type: Serosanguineous Exudate Color: red, brown Foul Odor After Cleansing: No Slough/Fibrino Yes Wound Bed Granulation Amount: Medium (34-66%) Exposed Structure Granulation Quality: Red Fascia Exposed: No Necrotic Amount: Medium (34-66%) Fat Layer (Subcutaneous Tissue) Exposed: Yes Necrotic Quality: Adherent Slough Tendon Exposed: No Muscle Exposed: No Joint Exposed: No Bone Exposed: No Treatment Notes Wound #2 (Lower Leg) Wound Laterality: Right, Distal Cleanser Byram Ancillary Kit - 15 Day Supply Discharge Instruction: Use supplies as instructed; Kit contains: (15) Saline Bullets; (15) 3x3 Gauze; 15 pr Gloves Normal Saline Discharge Instruction: Wash your hands with soap and water. Remove old dressing, discard into plastic bag and place into trash. Cleanse the wound with Normal Saline prior to applying a clean dressing using gauze sponges, not tissues or cotton balls. Do not Jiggetts, Aleric R. ([id number]) scrub or use excessive force. Pat dry using gauze sponges, not  tissue or cotton balls. Peri-Wound  Care Topical Primary Dressing Hydrofera Blue Ready Transfer Foam, 4x5 (in/in) Discharge Instruction: Apply Hydrofera Blue Ready to wound bed as directed Santyl Collagenase Ointment, 30 (gm), tube Secondary Dressing Zetuvit Plus Silicone Border Dressing 5x5 (in/in) Secured With Compression Wrap Compression Stockings Add-Ons Electronic Signature(s) Signed: 06/30/2021 4:57:35 PM By: Carlene Coria RN Entered By: Carlene Coria on 06/30/2021 08:18:43 Petrovic, [name] ([id number]) -------------------------------------------------------------------------------- Vitals Details Patient Name: [name]. Date of Service: 06/30/2021 8:00 AM Medical Record Number: [id number] Patient Account Number: [id number] Date of Birth/Sex: [birth year] (80 y.o. M) Treating RN: Carlene Coria Primary Care Shaleena Crusoe: Clayborn Bigness Other Clinician: Referring Piya Mesch: Clayborn Bigness Treating Sheron Robin/Extender: Yaakov Guthrie in Treatment: 1 Vital Signs Time Taken: 08:09 Temperature (F): 99.2 Height (in): 71 Pulse (bpm): 91 Weight (lbs): 245 Respiratory Rate (breaths/min): 18 Body Mass Index (BMI): 34.2 Blood Pressure (mmHg): 156/67 Reference Range: 80 - 120 mg / dl Electronic Signature(s) Signed: 06/30/2021 4:57:35 PM By: Carlene Coria RN Entered By: Carlene Coria on 06/30/2021 08:09:20

## 2021-06-30 NOTE — Progress Notes (Signed)
ROARK, RUFO ([id number]) Visit Report for 06/30/2021 Chief Complaint Document Details Patient Name: [name], [name]. Date of Service: 06/30/2021 8:00 AM Medical Record Number: [id number] Patient Account Number: [id number] Date of Birth/Sex: 05/28/1941 (80 y.o. M) Treating RN: Carlene Coria Primary Care Provider: Clayborn Bigness Other Clinician: Referring Provider: Clayborn Bigness Treating Provider/Extender: Yaakov Guthrie in Treatment: 1 Information Obtained from: Patient Chief Complaint Right lower extremity trauma wound Electronic Signature(s) Signed: 06/30/2021 8:44:21 AM By: Kalman Shan DO Entered By: Kalman Shan on 06/30/2021 08:37:23 Rising, Leanor Kail ([id number]) -------------------------------------------------------------------------------- HPI Details Patient Name: [name] Date of Service: 06/30/2021 8:00 AM Medical Record Number: [id number] Patient Account Number: [id number] Date of Birth/Sex: 06-30-1941 (80 y.o. M) Treating RN: Carlene Coria Primary Care Provider: Clayborn Bigness Other Clinician: Referring Provider: Clayborn Bigness Treating Provider/Extender: Yaakov Guthrie in Treatment: 1 History of Present Illness HPI Description: Admission 8/24 Zakariyya Helfman is an 80 year old male with a past medical history of insulin-dependent type 2 diabetes, essential hypertension, and total knee arthroplasty that presents to the clinic for A 2-day history of lower extremity wound that has developed redness to the surrounding wound bed. He states that the redness started in the past day. He states he was evaluated today by his primary care provider for a wellness visit and the wound was evaluated and he was started on Bactrim. He states he has taken a dose already. He denies drainage or pain. He currently denies systemic signs of infection. 8/31; patient presents for 1 week follow-up. He reports completing Bactrim and has noticed great improvement in his leg  redness and warmth. He reports that this has resolved. He denies pain or drainage. He has been using Hydrofera Blue every other day to the wound bed. Electronic Signature(s) Signed: 06/30/2021 8:44:21 AM By: Kalman Shan DO Entered By: Kalman Shan on 06/30/2021 08:38:24 Pester, Leanor Kail ([id number]) -------------------------------------------------------------------------------- Physical Exam Details Patient Name: [name]. Date of Service: 06/30/2021 8:00 AM Medical Record Number: [id number] Patient Account Number: [id number] Date of Birth/Sex: 10-Dec-1940 (80 y.o. M) Treating RN: Carlene Coria Primary Care Provider: Clayborn Bigness Other Clinician: Referring Provider: Clayborn Bigness Treating Provider/Extender: Yaakov Guthrie in Treatment: 1 Constitutional . Cardiovascular . Psychiatric . Notes Right lower extremity: No increased warmth or erythema is noted at last clinic visit. There are 2 open wounds with granulation tissue and scant nonviable tissue present. No signs of infection. 2+ pitting edema to the knee. Electronic Signature(s) Signed: 06/30/2021 8:44:21 AM By: Kalman Shan DO Entered By: Kalman Shan on 06/30/2021 08:39:06 Lilley, Leanor Kail ([id number]) -------------------------------------------------------------------------------- Physician Orders Details Patient Name: [name]. Date of Service: 06/30/2021 8:00 AM Medical Record Number: [id number] Patient Account Number: [id number] Date of Birth/Sex: [birth year] (80 y.o. M) Treating RN: Carlene Coria Primary Care Provider: Clayborn Bigness Other Clinician: Referring Provider: Clayborn Bigness Treating Provider/Extender: Yaakov Guthrie in Treatment: 1 Verbal / Phone Orders: No Diagnosis Coding Follow-up Appointments o Return Appointment in 1 week. Bathing/ Shower/ Hygiene o May shower with wound dressing protected with water repellent cover or cast protector. Edema Control -  Lymphedema / Segmental Compressive Device / Other o Elevate, Exercise Daily and Avoid Standing for Long Periods of Time. o Elevate legs to the level of the heart and pump ankles as often as possible o Elevate leg(s) parallel to the floor when sitting. Wound Treatment Wound #1 - Lower Leg Wound Laterality: Right, Proximal Cleanser: Byram Ancillary Kit - 15 Day Supply (Generic) Every Other Day/30 Days Discharge Instructions: Use supplies  as instructed; Kit contains: (15) Saline Bullets; (15) 3x3 Gauze; 15 pr Gloves Cleanser: Normal Saline (Generic) Every Other Day/30 Days Discharge Instructions: Wash your hands with soap and water. Remove old dressing, discard into plastic bag and place into trash. Cleanse the wound with Normal Saline prior to applying a clean dressing using gauze sponges, not tissues or cotton balls. Do not scrub or use excessive force. Pat dry using gauze sponges, not tissue or cotton balls. Primary Dressing: Hydrofera Blue Ready Transfer Foam, 4x5 (in/in) (Generic) Every Other Day/30 Days Discharge Instructions: Apply Hydrofera Blue Ready to wound bed as directed Primary Dressing: Santyl Collagenase Ointment, 30 (gm), tube Every Other Day/30 Days Secondary Dressing: Zetuvit Plus Silicone Border Dressing 5x5 (in/in) (Generic) Every Other Day/30 Days Wound #2 - Lower Leg Wound Laterality: Right, Distal Cleanser: Byram Ancillary Kit - 15 Day Supply (Generic) Every Other Day/30 Days Discharge Instructions: Use supplies as instructed; Kit contains: (15) Saline Bullets; (15) 3x3 Gauze; 15 pr Gloves Cleanser: Normal Saline (Generic) Every Other Day/30 Days Discharge Instructions: Wash your hands with soap and water. Remove old dressing, discard into plastic bag and place into trash. Cleanse the wound with Normal Saline prior to applying a clean dressing using gauze sponges, not tissues or cotton balls. Do not scrub or use excessive force. Pat dry using gauze sponges, not tissue  or cotton balls. Primary Dressing: Hydrofera Blue Ready Transfer Foam, 4x5 (in/in) (Generic) Every Other Day/30 Days Discharge Instructions: Apply Hydrofera Blue Ready to wound bed as directed Primary Dressing: Santyl Collagenase Ointment, 30 (gm), tube Every Other Day/30 Days Secondary Dressing: Zetuvit Plus Silicone Border Dressing 5x5 (in/in) (Generic) Every Other Day/30 Days Electronic Signature(s) Signed: 06/30/2021 8:44:21 AM By: Kalman Shan DO Signed: 06/30/2021 4:57:35 PM By: Carlene Coria RN Entered By: Carlene Coria on 06/30/2021 08:36:04 Dupin, Leanor Kail ([id number]) -------------------------------------------------------------------------------- Problem List Details Patient Name: ARMEL, RABBANI. Date of Service: 06/30/2021 8:00 AM Medical Record Number: [id number] Patient Account Number: [id number] Date of Birth/Sex: [birth year] (80 y.o. M) Treating RN: Carlene Coria Primary Care Provider: Clayborn Bigness Other Clinician: Referring Provider: Clayborn Bigness Treating Provider/Extender: Yaakov Guthrie in Treatment: 1 Active Problems ICD-10 Encounter Code Description Active Date MDM Diagnosis S81.801D Unspecified open wound, right lower leg, subsequent encounter 06/30/2021 No Yes E11.9 Type 2 diabetes mellitus without complications 12/12/9415 No Yes I10 Essential (primary) hypertension 06/23/2021 No Yes N18.30 Chronic kidney disease, stage 3 unspecified 06/23/2021 No Yes Z96.659 Presence of unspecified artificial knee joint 06/23/2021 No Yes Inactive Problems ICD-10 Code Description Active Date Inactive Date S81.801A Unspecified open wound, right lower leg, initial encounter 06/23/2021 06/23/2021 Resolved Problems Electronic Signature(s) Signed: 06/30/2021 8:44:21 AM By: Kalman Shan DO Entered By: Kalman Shan on 06/30/2021 08:37:07 Preusser, Leanor Kail ([id number]) -------------------------------------------------------------------------------- Progress Note  Details Patient Name: [name]. Date of Service: 06/30/2021 8:00 AM Medical Record Number: [id number] Patient Account Number: [id number] Date of Birth/Sex: 23-Jul-1941 (80 y.o. M) Treating RN: Carlene Coria Primary Care Provider: Clayborn Bigness Other Clinician: Referring Provider: Clayborn Bigness Treating Provider/Extender: Yaakov Guthrie in Treatment: 1 Subjective Chief Complaint Information obtained from Patient Right lower extremity trauma wound History of Present Illness (HPI) Admission 8/24 Sahej Schrieber is an 80 year old male with a past medical history of insulin-dependent type 2 diabetes, essential hypertension, and total knee arthroplasty that presents to the clinic for A 2-day history of lower extremity wound that has developed redness to the surrounding wound bed. He states that the redness started in the past day. He states he was  evaluated today by his primary care provider for a wellness visit and the wound was evaluated and he was started on Bactrim. He states he has taken a dose already. He denies drainage or pain. He currently denies systemic signs of infection. 8/31; patient presents for 1 week follow-up. He reports completing Bactrim and has noticed great improvement in his leg redness and warmth. He reports that this has resolved. He denies pain or drainage. He has been using Hydrofera Blue every other day to the wound bed. Patient History Information obtained from Patient. Social History Former smoker, Marital Status - Married, Alcohol Use - Rarely, Drug Use - No History, Caffeine Use - Daily. Medical History Cardiovascular Patient has history of Hypertension Endocrine Patient has history of Type II Diabetes Objective Constitutional Vitals Time Taken: 8:09 AM, Height: 71 in, Weight: 245 lbs, BMI: 34.2, Temperature: 99.2 F, Pulse: 91 bpm, Respiratory Rate: 18 breaths/min, Blood Pressure: 156/67 mmHg. General Notes: Right lower extremity: No increased  warmth or erythema is noted at last clinic visit. There are 2 open wounds with granulation tissue and scant nonviable tissue present. No signs of infection. 2+ pitting edema to the knee. Integumentary (Hair, Skin) Wound #1 status is Open. Original cause of wound was Trauma. The date acquired was: 06/21/2021. The wound has been in treatment 1 weeks. The wound is located on the Right,Proximal Lower Leg. The wound measures 11cm length x 0.9cm width x 0.1cm depth; 7.775cm^2 area and 0.778cm^3 volume. There is Fat Layer (Subcutaneous Tissue) exposed. There is no tunneling or undermining noted. There is a medium amount of serosanguineous drainage noted. There is large (67-100%) red granulation within the wound bed. There is a small (1-33%) amount of necrotic tissue within the wound bed including Adherent Slough. Wound #2 status is Open. Original cause of wound was Trauma. The date acquired was: 06/21/2021. The wound has been in treatment 1 weeks. The wound is located on the Right,Distal Lower Leg. The wound measures 3.3cm length x 1.4cm width x 0.1cm depth; 3.629cm^2 area and 0.363cm^3 volume. There is Fat Layer (Subcutaneous Tissue) exposed. There is no tunneling or undermining noted. There is a medium amount of serosanguineous drainage noted. There is medium (34-66%) red granulation within the wound bed. There is a medium (34-66%) amount of necrotic tissue within the wound bed including Adherent Slough. EDY, BELT ([id number]) Assessment Active Problems ICD-10 Unspecified open wound, right lower leg, subsequent encounter Type 2 diabetes mellitus without complications Essential (primary) hypertension Chronic kidney disease, stage 3 unspecified Presence of unspecified artificial knee joint Patient's cellulitis has resolved with oral antibiotics. Currently the wound beds look well-healing. There is some scant nonviable tissue present. I recommended continuing with Hydrofera Blue every other day.  He may benefit from Lavon but will wait and reassess at next clinic visit. Overall he has shown great improvement since his initial visit. Plan Follow-up Appointments: Return Appointment in 1 week. Bathing/ Shower/ Hygiene: May shower with wound dressing protected with water repellent cover or cast protector. Edema Control - Lymphedema / Segmental Compressive Device / Other: Elevate, Exercise Daily and Avoid Standing for Long Periods of Time. Elevate legs to the level of the heart and pump ankles as often as possible Elevate leg(s) parallel to the floor when sitting. WOUND #1: - Lower Leg Wound Laterality: Right, Proximal Cleanser: Byram Ancillary Kit - 15 Day Supply (Generic) Every Other Day/30 Days Discharge Instructions: Use supplies as instructed; Kit contains: (15) Saline Bullets; (15) 3x3 Gauze; 15 pr Gloves Cleanser: Normal Saline (  Generic) Every Other Day/30 Days Discharge Instructions: Wash your hands with soap and water. Remove old dressing, discard into plastic bag and place into trash. Cleanse the wound with Normal Saline prior to applying a clean dressing using gauze sponges, not tissues or cotton balls. Do not scrub or use excessive force. Pat dry using gauze sponges, not tissue or cotton balls. Primary Dressing: Hydrofera Blue Ready Transfer Foam, 4x5 (in/in) (Generic) Every Other Day/30 Days Discharge Instructions: Apply Hydrofera Blue Ready to wound bed as directed Primary Dressing: Santyl Collagenase Ointment, 30 (gm), tube Every Other Day/30 Days Secondary Dressing: Zetuvit Plus Silicone Border Dressing 5x5 (in/in) (Generic) Every Other Day/30 Days WOUND #2: - Lower Leg Wound Laterality: Right, Distal Cleanser: Byram Ancillary Kit - 15 Day Supply (Generic) Every Other Day/30 Days Discharge Instructions: Use supplies as instructed; Kit contains: (15) Saline Bullets; (15) 3x3 Gauze; 15 pr Gloves Cleanser: Normal Saline (Generic) Every Other Day/30 Days Discharge  Instructions: Wash your hands with soap and water. Remove old dressing, discard into plastic bag and place into trash. Cleanse the wound with Normal Saline prior to applying a clean dressing using gauze sponges, not tissues or cotton balls. Do not scrub or use excessive force. Pat dry using gauze sponges, not tissue or cotton balls. Primary Dressing: Hydrofera Blue Ready Transfer Foam, 4x5 (in/in) (Generic) Every Other Day/30 Days Discharge Instructions: Apply Hydrofera Blue Ready to wound bed as directed Primary Dressing: Santyl Collagenase Ointment, 30 (gm), tube Every Other Day/30 Days Secondary Dressing: Zetuvit Plus Silicone Border Dressing 5x5 (in/in) (Generic) Every Other Day/30 Days 1. Hydrofera Blue 2. Follow-up in 1 week Electronic Signature(s) Signed: 06/30/2021 8:44:21 AM By: Kalman Shan DO Entered By: Kalman Shan on 06/30/2021 08:43:42 Szabo, Leanor Kail ([id number]) -------------------------------------------------------------------------------- ROS/PFSH Details Patient Name: [name]. Date of Service: 06/30/2021 8:00 AM Medical Record Number: [id number] Patient Account Number: [id number] Date of Birth/Sex: 11-27-1940 (80 y.o. M) Treating RN: Carlene Coria Primary Care Provider: Clayborn Bigness Other Clinician: Referring Provider: Clayborn Bigness Treating Provider/Extender: Yaakov Guthrie in Treatment: 1 Information Obtained From Patient Cardiovascular Medical History: Positive for: Hypertension Endocrine Medical History: Positive for: Type II Diabetes Time with diabetes: 47 Treated with: Insulin, Oral agents Blood sugar tested every day: No Immunizations Pneumococcal Vaccine: Received Pneumococcal Vaccination: No Implantable Devices None Family and Social History Former smoker; Marital Status - Married; Alcohol Use: Rarely; Drug Use: No History; Caffeine Use: Daily; Financial Concerns: No; Food, Clothing or Shelter Needs: No; Support System  Lacking: No; Transportation Concerns: No Electronic Signature(s) Signed: 06/30/2021 8:44:21 AM By: Kalman Shan DO Signed: 06/30/2021 4:57:35 PM By: Carlene Coria RN Entered By: Kalman Shan on 06/30/2021 08:38:31 Denson, Leanor Kail ([id number]) -------------------------------------------------------------------------------- SuperBill Details Patient Name: [name]. Date of Service: 06/30/2021 Medical Record Number: [id number] Patient Account Number: [id number] Date of Birth/Sex: 1941-05-18 (80 y.o. M) Treating RN: Carlene Coria Primary Care Provider: Clayborn Bigness Other Clinician: Referring Provider: Clayborn Bigness Treating Provider/Extender: Yaakov Guthrie in Treatment: 1 Diagnosis Coding ICD-10 Codes Code Description [phone] Unspecified open wound, right lower leg, subsequent encounter E11.9 Type 2 diabetes mellitus without complications Z12 Essential (primary) hypertension N18.30 Chronic kidney disease, stage 3 unspecified Z96.659 Presence of unspecified artificial knee joint Facility Procedures CPT4 Code: [id number] Description: 99213 - WOUND CARE VISIT-LEV 3 EST PT Modifier: Quantity: 1 Physician Procedures CPT4 Code: [id number] Description: 97673 - WC PHYS LEVEL 3 - EST PT Modifier: Quantity: 1 CPT4 Code: Description: ICD-10 Diagnosis Description S81.801D Unspecified open wound, right lower leg, subsequent encounter E11.9  Type 2 diabetes mellitus without complications A33 Essential (primary) hypertension N18.30 Chronic kidney disease, stage 3 unspecified Modifier: Quantity: Electronic Signature(s) Signed: 06/30/2021 8:44:21 AM By: Kalman Shan DO Entered By: Kalman Shan on 06/30/2021 08:43:59

## 2021-07-03 LAB — ANAEROBIC AND AEROBIC CULTURE

## 2021-07-07 ENCOUNTER — Encounter: Payer: Medicare HMO | Attending: Internal Medicine | Admitting: Internal Medicine

## 2021-07-07 ENCOUNTER — Other Ambulatory Visit (INDEPENDENT_AMBULATORY_CARE_PROVIDER_SITE_OTHER): Payer: Self-pay | Admitting: Internal Medicine

## 2021-07-07 ENCOUNTER — Other Ambulatory Visit: Payer: Self-pay

## 2021-07-07 DIAGNOSIS — Z794 Long term (current) use of insulin: Secondary | ICD-10-CM | POA: Insufficient documentation

## 2021-07-07 DIAGNOSIS — Z87891 Personal history of nicotine dependence: Secondary | ICD-10-CM | POA: Insufficient documentation

## 2021-07-07 DIAGNOSIS — S81801D Unspecified open wound, right lower leg, subsequent encounter: Secondary | ICD-10-CM | POA: Diagnosis not present

## 2021-07-07 DIAGNOSIS — E119 Type 2 diabetes mellitus without complications: Secondary | ICD-10-CM

## 2021-07-07 DIAGNOSIS — X58XXXD Exposure to other specified factors, subsequent encounter: Secondary | ICD-10-CM | POA: Insufficient documentation

## 2021-07-07 DIAGNOSIS — Z96659 Presence of unspecified artificial knee joint: Secondary | ICD-10-CM | POA: Diagnosis not present

## 2021-07-07 DIAGNOSIS — E1122 Type 2 diabetes mellitus with diabetic chronic kidney disease: Secondary | ICD-10-CM | POA: Diagnosis not present

## 2021-07-07 DIAGNOSIS — N183 Chronic kidney disease, stage 3 unspecified: Secondary | ICD-10-CM | POA: Diagnosis not present

## 2021-07-07 DIAGNOSIS — S81801A Unspecified open wound, right lower leg, initial encounter: Secondary | ICD-10-CM

## 2021-07-07 DIAGNOSIS — I129 Hypertensive chronic kidney disease with stage 1 through stage 4 chronic kidney disease, or unspecified chronic kidney disease: Secondary | ICD-10-CM | POA: Insufficient documentation

## 2021-07-14 ENCOUNTER — Encounter (HOSPITAL_BASED_OUTPATIENT_CLINIC_OR_DEPARTMENT_OTHER): Payer: Medicare HMO | Admitting: Internal Medicine

## 2021-07-14 ENCOUNTER — Other Ambulatory Visit: Payer: Self-pay

## 2021-07-14 DIAGNOSIS — E119 Type 2 diabetes mellitus without complications: Secondary | ICD-10-CM

## 2021-07-14 DIAGNOSIS — S81801D Unspecified open wound, right lower leg, subsequent encounter: Secondary | ICD-10-CM | POA: Diagnosis not present

## 2021-07-14 DIAGNOSIS — X58XXXD Exposure to other specified factors, subsequent encounter: Secondary | ICD-10-CM | POA: Diagnosis not present

## 2021-07-14 DIAGNOSIS — Z96659 Presence of unspecified artificial knee joint: Secondary | ICD-10-CM | POA: Diagnosis not present

## 2021-07-14 DIAGNOSIS — N183 Chronic kidney disease, stage 3 unspecified: Secondary | ICD-10-CM | POA: Diagnosis not present

## 2021-07-14 DIAGNOSIS — E1122 Type 2 diabetes mellitus with diabetic chronic kidney disease: Secondary | ICD-10-CM | POA: Diagnosis not present

## 2021-07-14 DIAGNOSIS — I129 Hypertensive chronic kidney disease with stage 1 through stage 4 chronic kidney disease, or unspecified chronic kidney disease: Secondary | ICD-10-CM | POA: Diagnosis not present

## 2021-07-14 DIAGNOSIS — Z794 Long term (current) use of insulin: Secondary | ICD-10-CM | POA: Diagnosis not present

## 2021-07-14 DIAGNOSIS — Z87891 Personal history of nicotine dependence: Secondary | ICD-10-CM | POA: Diagnosis not present

## 2021-07-14 NOTE — Progress Notes (Signed)
EIDEN, BAGOT ([id number]) Visit Report for 07/07/2021 Chief Complaint Document Details Patient Name: [name], [name]. Date of Service: 07/07/2021 10:15 AM Medical Record Number: [id number] Patient Account Number: [id number] Date of Birth/Sex: [birth year] (80 y.o. M) Treating RN: Carlene Coria Primary Care Provider: Clayborn Bigness Other Clinician: Referring Provider: Clayborn Bigness Treating Provider/Extender: Yaakov Guthrie in Treatment: 2 Information Obtained from: Patient Chief Complaint Right lower extremity trauma wound Electronic Signature(s) Signed: 07/07/2021 10:41:12 AM By: Kalman Shan DO Entered By: Kalman Shan on 07/07/2021 10:34:39 [name], [name] ([id number]) -------------------------------------------------------------------------------- Debridement Details Patient Name: [name] Date of Service: 07/07/2021 10:15 AM Medical Record Number: [id number] Patient Account Number: [id number] Date of Birth/Sex: Mar 07, 1941 (80 y.o. M) Treating RN: Carlene Coria Primary Care Provider: Clayborn Bigness Other Clinician: Referring Provider: Clayborn Bigness Treating Provider/Extender: Yaakov Guthrie in Treatment: 2 Debridement Performed for Wound #2 Right,Distal Lower Leg Assessment: Performed By: Physician Kalman Shan, MD Debridement Type: Debridement Severity of Tissue Pre Debridement: Fat layer exposed Level of Consciousness (Pre- Awake and Alert procedure): Pre-procedure Verification/Time Out Yes - 10:29 Taken: Start Time: 10:29 Total Area Debrided (L x W): 3 (cm) x 1 (cm) = 3 (cm) Tissue and other material Viable, Non-Viable, Slough, Subcutaneous, Skin: Dermis , Skin: Epidermis, Slough debrided: Level: Skin/Subcutaneous Tissue Debridement Description: Excisional Instrument: Curette Bleeding: Moderate Hemostasis Achieved: Pressure End Time: 10:32 Procedural Pain: 0 Post Procedural Pain: 0 Response to Treatment: Procedure was tolerated  well Level of Consciousness (Post- Awake and Alert procedure): Post Debridement Measurements of Total Wound Length: (cm) 3 Width: (cm) 1 Depth: (cm) 0.1 Volume: (cm) 0.236 Character of Wound/Ulcer Post Debridement: Improved Severity of Tissue Post Debridement: Fat layer exposed Post Procedure Diagnosis Same as Pre-procedure Electronic Signature(s) Signed: 07/07/2021 10:41:12 AM By: Kalman Shan DO Signed: 07/14/2021 7:54:52 AM By: Carlene Coria RN Entered By: Carlene Coria on 07/07/2021 10:32:59 [name], [name] ([id number]) -------------------------------------------------------------------------------- HPI Details Patient Name: [name]. Date of Service: 07/07/2021 10:15 AM Medical Record Number: [id number] Patient Account Number: [id number] Date of Birth/Sex: [birth year] (80 y.o. M) Treating RN: Carlene Coria Primary Care Provider: Clayborn Bigness Other Clinician: Referring Provider: Clayborn Bigness Treating Provider/Extender: Yaakov Guthrie in Treatment: 2 History of Present Illness HPI Description: Admission 8/24 [name] is an 80 year old male with a past medical history of insulin-dependent type 2 diabetes, essential hypertension, and total knee arthroplasty that presents to the clinic for A 2-day history of lower extremity wound that has developed redness to the surrounding wound bed. He states that the redness started in the past day. He states he was evaluated today by his primary care provider for a wellness visit and the wound was evaluated and he was started on Bactrim. He states he has taken a dose already. He denies drainage or pain. He currently denies systemic signs of infection. 8/31; patient presents for 1 week follow-up. He reports completing Bactrim and has noticed great improvement in his leg redness and warmth. He reports that this has resolved. He denies pain or drainage. He has been using Hydrofera Blue every other day to the wound bed. 9/7;  patient presents for 1 week follow-up. He has no issues or complaints today. He continues to use Hydrofera Blue to the wound beds. He denies signs of infection. Electronic Signature(s) Signed: 07/07/2021 10:41:12 AM By: Kalman Shan DO Entered By: Kalman Shan on 07/07/2021 10:35:06 [name], [name] ([id number]) -------------------------------------------------------------------------------- Physical Exam Details Patient Name: [name], [name]. Date of Service: 07/07/2021 10:15 AM Medical Record Number: [id number]  Patient Account Number: [id number] Date of Birth/Sex: 02-03-1941 (80 y.o. M) Treating RN: Carlene Coria Primary Care Provider: Clayborn Bigness Other Clinician: Referring Provider: Clayborn Bigness Treating Provider/Extender: Yaakov Guthrie in Treatment: 2 Constitutional . Cardiovascular . Psychiatric . Notes Right lower extremity: There are 2 open wounds with granulation tissue To the most proximal wound and nonviable tissue throughout the most distal wound. No signs of infection. 2+ pitting edema to the knee. Electronic Signature(s) Signed: 07/07/2021 10:41:12 AM By: Kalman Shan DO Entered By: Kalman Shan on 07/07/2021 10:38:28 [name] ([id number]) -------------------------------------------------------------------------------- Physician Orders Details Patient Name: [name], [name]. Date of Service: 07/07/2021 10:15 AM Medical Record Number: [id number] Patient Account Number: [id number] Date of Birth/Sex: Oct 21, 1941 (80 y.o. M) Treating RN: Carlene Coria Primary Care Provider: Clayborn Bigness Other Clinician: Referring Provider: Clayborn Bigness Treating Provider/Extender: Yaakov Guthrie in Treatment: 2 Verbal / Phone Orders: No Diagnosis Coding Follow-up Appointments o Return Appointment in 1 week. Bathing/ Shower/ Hygiene o May shower with wound dressing protected with water repellent cover or cast protector. Edema Control - Lymphedema /  Segmental Compressive Device / Other o Elevate, Exercise Daily and Avoid Standing for Long Periods of Time. o Elevate legs to the level of the heart and pump ankles as often as possible o Elevate leg(s) parallel to the floor when sitting. Wound Treatment Wound #1 - Lower Leg Wound Laterality: Right, Proximal Cleanser: Byram Ancillary Kit - 15 Day Supply (Generic) Every Other Day/30 Days Discharge Instructions: Use supplies as instructed; Kit contains: (15) Saline Bullets; (15) 3x3 Gauze; 15 pr Gloves Cleanser: Normal Saline (Generic) Every Other Day/30 Days Discharge Instructions: Wash your hands with soap and water. Remove old dressing, discard into plastic bag and place into trash. Cleanse the wound with Normal Saline prior to applying a clean dressing using gauze sponges, not tissues or cotton balls. Do not scrub or use excessive force. Pat dry using gauze sponges, not tissue or cotton balls. Primary Dressing: Hydrofera Blue Ready Transfer Foam, 4x5 (in/in) (Generic) Every Other Day/30 Days Discharge Instructions: Apply Hydrofera Blue Ready to wound bed as directed Primary Dressing: Santyl Collagenase Ointment, 30 (gm), tube Every Other Day/30 Days Secondary Dressing: Zetuvit Plus Silicone Border Dressing 5x5 (in/in) (Generic) Every Other Day/30 Days Wound #2 - Lower Leg Wound Laterality: Right, Distal Cleanser: Byram Ancillary Kit - 15 Day Supply (Generic) Every Other Day/30 Days Discharge Instructions: Use supplies as instructed; Kit contains: (15) Saline Bullets; (15) 3x3 Gauze; 15 pr Gloves Cleanser: Normal Saline (Generic) Every Other Day/30 Days Discharge Instructions: Wash your hands with soap and water. Remove old dressing, discard into plastic bag and place into trash. Cleanse the wound with Normal Saline prior to applying a clean dressing using gauze sponges, not tissues or cotton balls. Do not scrub or use excessive force. Pat dry using gauze sponges, not tissue or cotton  balls. Primary Dressing: Hydrofera Blue Ready Transfer Foam, 4x5 (in/in) (Generic) Every Other Day/30 Days Discharge Instructions: Apply Hydrofera Blue Ready to wound bed as directed Primary Dressing: Santyl Collagenase Ointment, 30 (gm), tube Every Other Day/30 Days Secondary Dressing: Zetuvit Plus Silicone Border Dressing 5x5 (in/in) (Generic) Every Other Day/30 Days Electronic Signature(s) Signed: 07/07/2021 10:41:12 AM By: Kalman Shan DO Signed: 07/14/2021 7:54:52 AM By: Carlene Coria RN Entered By: Carlene Coria on 07/07/2021 10:28:13 Grobe, [name] ([id number]) -------------------------------------------------------------------------------- Problem List Details Patient Name: [name], [name]. Date of Service: 07/07/2021 10:15 AM Medical Record Number: [id number] Patient Account Number: [id number] Date of Birth/Sex: 1941/01/11 (79 y.o.  M) Treating RN: Carlene Coria Primary Care Provider: Clayborn Bigness Other Clinician: Referring Provider: Clayborn Bigness Treating Provider/Extender: Yaakov Guthrie in Treatment: 2 Active Problems ICD-10 Encounter Code Description Active Date MDM Diagnosis S81.801D Unspecified open wound, right lower leg, subsequent encounter 06/30/2021 No Yes E11.9 Type 2 diabetes mellitus without complications 7/51/0258 No Yes I10 Essential (primary) hypertension 06/23/2021 No Yes N18.30 Chronic kidney disease, stage 3 unspecified 06/23/2021 No Yes Z96.659 Presence of unspecified artificial knee joint 06/23/2021 No Yes Inactive Problems ICD-10 Code Description Active Date Inactive Date S81.801A Unspecified open wound, right lower leg, initial encounter 06/23/2021 06/23/2021 Resolved Problems Electronic Signature(s) Signed: 07/07/2021 10:41:12 AM By: Kalman Shan DO Entered By: Kalman Shan on 07/07/2021 10:34:21 [name], [name] ([id number]) -------------------------------------------------------------------------------- Progress Note Details Patient  Name: [name]. Date of Service: 07/07/2021 10:15 AM Medical Record Number: [id number] Patient Account Number: [id number] Date of Birth/Sex: 24-Jan-1941 (80 y.o. M) Treating RN: Carlene Coria Primary Care Provider: Clayborn Bigness Other Clinician: Referring Provider: Clayborn Bigness Treating Provider/Extender: Yaakov Guthrie in Treatment: 2 Subjective Chief Complaint Information obtained from Patient Right lower extremity trauma wound History of Present Illness (HPI) Admission 8/24 Kimball Appleby is an 80 year old male with a past medical history of insulin-dependent type 2 diabetes, essential hypertension, and total knee arthroplasty that presents to the clinic for A 2-day history of lower extremity wound that has developed redness to the surrounding wound bed. He states that the redness started in the past day. He states he was evaluated today by his primary care provider for a wellness visit and the wound was evaluated and he was started on Bactrim. He states he has taken a dose already. He denies drainage or pain. He currently denies systemic signs of infection. 8/31; patient presents for 1 week follow-up. He reports completing Bactrim and has noticed great improvement in his leg redness and warmth. He reports that this has resolved. He denies pain or drainage. He has been using Hydrofera Blue every other day to the wound bed. 9/7; patient presents for 1 week follow-up. He has no issues or complaints today. He continues to use Hydrofera Blue to the wound beds. He denies signs of infection. Patient History Information obtained from Patient. Social History Former smoker, Marital Status - Married, Alcohol Use - Rarely, Drug Use - No History, Caffeine Use - Daily. Medical History Cardiovascular Patient has history of Hypertension Endocrine Patient has history of Type II Diabetes Objective Constitutional Vitals Time Taken: 10:15 AM, Height: 71 in, Weight: 245 lbs, BMI: 34.2,  Temperature: 98.5 F, Pulse: 103 bpm, Respiratory Rate: 18 breaths/min, Blood Pressure: 162/88 mmHg. General Notes: Right lower extremity: There are 2 open wounds with granulation tissue To the most proximal wound and nonviable tissue throughout the most distal wound. No signs of infection. 2+ pitting edema to the knee. Integumentary (Hair, Skin) Wound #1 status is Open. Original cause of wound was Trauma. The date acquired was: 06/21/2021. The wound has been in treatment 2 weeks. The wound is located on the Right,Proximal Lower Leg. The wound measures 10cm length x 0.3cm width x 0.1cm depth; 2.356cm^2 area and 0.236cm^3 volume. There is Fat Layer (Subcutaneous Tissue) exposed. There is no tunneling or undermining noted. There is a medium amount of serosanguineous drainage noted. There is large (67-100%) red granulation within the wound bed. There is a small (1-33%) amount of necrotic tissue within the wound bed including Adherent Slough. Wound #2 status is Open. Original cause of wound was Trauma. The date acquired was: 06/21/2021.  The wound has been in treatment 2 weeks. The wound is located on the Right,Distal Lower Leg. The wound measures 3cm length x 1cm width x 0.1cm depth; 2.356cm^2 area and 0.236cm^3 volume. There is Fat Layer (Subcutaneous Tissue) exposed. There is no tunneling or undermining noted. There is a medium amount of serosanguineous drainage noted. There is medium (34-66%) red granulation within the wound bed. There is a medium (34-66%) amount of [name], Tony R. ([id number]) necrotic tissue within the wound bed including Adherent Slough. Assessment Active Problems ICD-10 Unspecified open wound, right lower leg, subsequent encounter Type 2 diabetes mellitus without complications Essential (primary) hypertension Chronic kidney disease, stage 3 unspecified Presence of unspecified artificial knee joint Patient's wounds have shown improvement in size and appearance since last  clinic visit. To the most distal wound there is nonviable tissue and this was debrided in office today. There are no signs of infection on exam. I recommended continuing Hydrofera Blue to the wound beds. I will add Santyl in office today only to the most distal wound. He may benefit from [facility] in the future if he continues to have nonviable tissue. Follow-up in 1 week Procedures Wound #2 Pre-procedure diagnosis of Wound #2 is a Diabetic Wound/Ulcer of the Lower Extremity located on the Right,Distal Lower Leg .Severity of Tissue Pre Debridement is: Fat layer exposed. There was a Excisional Skin/Subcutaneous Tissue Debridement with a total area of 3 sq cm performed by Kalman Shan, MD. With the following instrument(s): Curette to remove Viable and Non-Viable tissue/material. Material removed includes Subcutaneous Tissue, Slough, Skin: Dermis, and Skin: Epidermis. No specimens were taken. A time out was conducted at 10:29, prior to the start of the procedure. A Moderate amount of bleeding was controlled with Pressure. The procedure was tolerated well with a pain level of 0 throughout and a pain level of 0 following the procedure. Post Debridement Measurements: 3cm length x 1cm width x 0.1cm depth; 0.236cm^3 volume. Character of Wound/Ulcer Post Debridement is improved. Severity of Tissue Post Debridement is: Fat layer exposed. Post procedure Diagnosis Wound #2: Same as Pre-Procedure Plan Follow-up Appointments: Return Appointment in 1 week. Bathing/ Shower/ Hygiene: May shower with wound dressing protected with water repellent cover or cast protector. Edema Control - Lymphedema / Segmental Compressive Device / Other: Elevate, Exercise Daily and Avoid Standing for Long Periods of Time. Elevate legs to the level of the heart and pump ankles as often as possible Elevate leg(s) parallel to the floor when sitting. WOUND #1: - Lower Leg Wound Laterality: Right, Proximal Cleanser: Byram Ancillary  Kit - 15 Day Supply (Generic) Every Other Day/30 Days Discharge Instructions: Use supplies as instructed; Kit contains: (15) Saline Bullets; (15) 3x3 Gauze; 15 pr Gloves Cleanser: Normal Saline (Generic) Every Other Day/30 Days Discharge Instructions: Wash your hands with soap and water. Remove old dressing, discard into plastic bag and place into trash. Cleanse the wound with Normal Saline prior to applying a clean dressing using gauze sponges, not tissues or cotton balls. Do not scrub or use excessive force. Pat dry using gauze sponges, not tissue or cotton balls. Primary Dressing: Hydrofera Blue Ready Transfer Foam, 4x5 (in/in) (Generic) Every Other Day/30 Days Discharge Instructions: Apply Hydrofera Blue Ready to wound bed as directed Primary Dressing: Santyl Collagenase Ointment, 30 (gm), tube Every Other Day/30 Days Secondary Dressing: Zetuvit Plus Silicone Border Dressing 5x5 (in/in) (Generic) Every Other Day/30 Days WOUND #2: - Lower Leg Wound Laterality: Right, Distal Cleanser: Byram Ancillary Kit - 15 Day Supply (Generic) Every Other  Day/30 Days Discharge Instructions: Use supplies as instructed; Kit contains: (15) Saline Bullets; (15) 3x3 Gauze; 15 pr Gloves Cleanser: Normal Saline (Generic) Every Other Day/30 Days Discharge Instructions: Wash your hands with soap and water. Remove old dressing, discard into plastic bag and place into trash. Cleanse the wound with Normal Saline prior to applying a clean dressing using gauze sponges, not tissues or cotton balls. Do not scrub or use excessive force. Pat dry using gauze sponges, not tissue or cotton balls. Primary Dressing: Hydrofera Blue Ready Transfer Foam, 4x5 (in/in) (Generic) Every Other Day/30 Days Discharge Instructions: Apply Hydrofera Blue Ready to wound bed as directed [name], [name] ([id number]) Primary Dressing: Santyl Collagenase Ointment, 30 (gm), tube Every Other Day/30 Days Secondary Dressing: Zetuvit Plus Silicone  Border Dressing 5x5 (in/in) (Generic) Every Other Day/30 Days 1. Hydrofera Blue 2. Follow-up in 1 week Electronic Signature(s) Signed: 07/07/2021 10:41:12 AM By: Kalman Shan DO Entered By: Kalman Shan on 07/07/2021 10:40:41 [name], [name] ([id number]) -------------------------------------------------------------------------------- ROS/PFSH Details Patient Name: [name]. Date of Service: 07/07/2021 10:15 AM Medical Record Number: [id number] Patient Account Number: [id number] Date of Birth/Sex: 07/05/1941 (80 y.o. M) Treating RN: Carlene Coria Primary Care Provider: Clayborn Bigness Other Clinician: Referring Provider: Clayborn Bigness Treating Provider/Extender: Yaakov Guthrie in Treatment: 2 Information Obtained From Patient Cardiovascular Medical History: Positive for: Hypertension Endocrine Medical History: Positive for: Type II Diabetes Time with diabetes: 4 Treated with: Insulin, Oral agents Blood sugar tested every day: No Immunizations Pneumococcal Vaccine: Received Pneumococcal Vaccination: No Implantable Devices None Family and Social History Former smoker; Marital Status - Married; Alcohol Use: Rarely; Drug Use: No History; Caffeine Use: Daily; Financial Concerns: No; Food, Clothing or Shelter Needs: No; Support System Lacking: No; Transportation Concerns: No Electronic Signature(s) Signed: 07/07/2021 10:41:12 AM By: Kalman Shan DO Signed: 07/14/2021 7:54:52 AM By: Carlene Coria RN Entered By: Kalman Shan on 07/07/2021 10:35:13 [name], [name] ([id number]) -------------------------------------------------------------------------------- SuperBill Details Patient Name: [name]. Date of Service: 07/07/2021 Medical Record Number: [id number] Patient Account Number: [id number] Date of Birth/Sex: 1941/04/24 (80 y.o. M) Treating RN: Carlene Coria Primary Care Provider: Clayborn Bigness Other Clinician: Referring Provider: Clayborn Bigness Treating  Provider/Extender: Yaakov Guthrie in Treatment: 2 Diagnosis Coding ICD-10 Codes Code Description [phone] Unspecified open wound, right lower leg, subsequent encounter E11.9 Type 2 diabetes mellitus without complications S46 Essential (primary) hypertension N18.30 Chronic kidney disease, stage 3 unspecified Z96.659 Presence of unspecified artificial knee joint Facility Procedures CPT4 Code: [id number] Description: 60901 - DEB SUBQ TISSUE 20 SQ CM/< Modifier: Quantity: 1 CPT4 Code: Description: ICD-10 Diagnosis Description S81.801D Unspecified open wound, right lower leg, subsequent encounter E11.9 Type 2 diabetes mellitus without complications Modifier: Quantity: Physician Procedures CPT4 Code: [id number] Description: 11042 - WC PHYS SUBQ TISS 20 SQ CM Modifier: Quantity: 1 CPT4 Code: Description: ICD-10 Diagnosis Description S81.801D Unspecified open wound, right lower leg, subsequent encounter E11.9 Type 2 diabetes mellitus without complications Modifier: Quantity: Electronic Signature(s) Signed: 07/07/2021 10:41:12 AM By: Kalman Shan DO Entered By: Kalman Shan on 07/07/2021 10:40:54

## 2021-07-14 NOTE — Progress Notes (Signed)
[name], [name] ([id number]) Visit Report for 07/14/2021 Arrival Information Details Patient Name: [name], MUDRY. Date of Service: 07/14/2021 8:00 AM Medical Record Number: [id number] Patient Account Number: [id number] Date of Birth/Sex: [birth year] (80 y.o. M) Treating RN: Carlene Coria Primary Care Donielle Radziewicz: Clayborn Bigness Other Clinician: Referring Beverlyann Broxterman: Clayborn Bigness Treating Nicolette Gieske/Extender: Yaakov Guthrie in Treatment: 3 Visit Information History Since Last Visit All ordered tests and consults were completed: No Patient Arrived: Ambulatory Added or deleted any medications: No Arrival Time: 08:05 Any new allergies or adverse reactions: No Accompanied By: self Had a fall or experienced change in No Transfer Assistance: None activities of daily living that may affect Patient Identification Verified: Yes risk of falls: Secondary Verification Process Completed: Yes Signs or symptoms of abuse/neglect since last visito No Patient Requires Transmission-Based Precautions: No Hospitalized since last visit: No Patient Has Alerts: Yes Implantable device outside of the clinic excluding No Patient Alerts: non compressable cellular tissue based products placed in the center since last visit: Has Dressing in Place as Prescribed: Yes Pain Present Now: No Electronic Signature(s) Signed: 07/14/2021 3:36:37 PM By: Carlene Coria RN Entered By: Carlene Coria on 07/14/2021 08:12:41 [name], [name] ([id number]) -------------------------------------------------------------------------------- Clinic Level of Care Assessment Details Patient Name: [name] Date of Service: 07/14/2021 8:00 AM Medical Record Number: [id number] Patient Account Number: [id number] Date of Birth/Sex: 10/14/1941 (80 y.o. M) Treating RN: Carlene Coria Primary Care Ritta Hammes: Clayborn Bigness Other Clinician: Referring Lanay Zinda: Clayborn Bigness Treating Gorden Stthomas/Extender: Yaakov Guthrie in Treatment:  3 Clinic Level of Care Assessment Items TOOL 4 Quantity Score X - Use when only an EandM is performed on FOLLOW-UP visit 1 0 ASSESSMENTS - Nursing Assessment / Reassessment X - Reassessment of Co-morbidities (includes updates in patient status) 1 10 X- 1 5 Reassessment of Adherence to Treatment Plan ASSESSMENTS - Wound and Skin Assessment / Reassessment X - Simple Wound Assessment / Reassessment - one wound 1 5 '[]'  - 0 Complex Wound Assessment / Reassessment - multiple wounds '[]'  - 0 Dermatologic / Skin Assessment (not related to wound area) ASSESSMENTS - Focused Assessment '[]'  - Circumferential Edema Measurements - multi extremities 0 '[]'  - 0 Nutritional Assessment / Counseling / Intervention '[]'  - 0 Lower Extremity Assessment (monofilament, tuning fork, pulses) '[]'  - 0 Peripheral Arterial Disease Assessment (using hand held doppler) ASSESSMENTS - Ostomy and/or Continence Assessment and Care '[]'  - Incontinence Assessment and Management 0 '[]'  - 0 Ostomy Care Assessment and Management (repouching, etc.) PROCESS - Coordination of Care X - Simple Patient / Family Education for ongoing care 1 15 '[]'  - 0 Complex (extensive) Patient / Family Education for ongoing care '[]'  - 0 Staff obtains Programmer, systems, Records, Test Results / Process Orders '[]'  - 0 Staff telephones HHA, Nursing Homes / Clarify orders / etc '[]'  - 0 Routine Transfer to another Facility (non-emergent condition) '[]'  - 0 Routine Hospital Admission (non-emergent condition) '[]'  - 0 New Admissions / Biomedical engineer / Ordering NPWT, Apligraf, etc. '[]'  - 0 Emergency Hospital Admission (emergent condition) X- 1 10 Simple Discharge Coordination '[]'  - 0 Complex (extensive) Discharge Coordination PROCESS - Special Needs '[]'  - Pediatric / Minor Patient Management 0 '[]'  - 0 Isolation Patient Management '[]'  - 0 Hearing / Language / Visual special needs '[]'  - 0 Assessment of Community assistance (transportation, D/C planning,  etc.) '[]'  - 0 Additional assistance / Altered mentation '[]'  - 0 Support Surface(s) Assessment (bed, cushion, seat, etc.) INTERVENTIONS - Wound Cleansing / Measurement Whipkey, Werner R. ([id number]) '[]'  - 0  Simple Wound Cleansing - one wound X- 2 5 Complex Wound Cleansing - multiple wounds X- 1 5 Wound Imaging (photographs - any number of wounds) '[]'  - 0 Wound Tracing (instead of photographs) '[]'  - 0 Simple Wound Measurement - one wound X- 2 5 Complex Wound Measurement - multiple wounds INTERVENTIONS - Wound Dressings X - Small Wound Dressing one or multiple wounds 1 10 '[]'  - 0 Medium Wound Dressing one or multiple wounds '[]'  - 0 Large Wound Dressing one or multiple wounds '[]'  - 0 Application of Medications - topical '[]'  - 0 Application of Medications - injection INTERVENTIONS - Miscellaneous '[]'  - External ear exam 0 '[]'  - 0 Specimen Collection (cultures, biopsies, blood, body fluids, etc.) '[]'  - 0 Specimen(s) / Culture(s) sent or taken to Lab for analysis '[]'  - 0 Patient Transfer (multiple staff / Civil Service fast streamer / Similar devices) '[]'  - 0 Simple Staple / Suture removal (25 or less) '[]'  - 0 Complex Staple / Suture removal (26 or more) '[]'  - 0 Hypo / Hyperglycemic Management (close monitor of Blood Glucose) '[]'  - 0 Ankle / Brachial Index (ABI) - do not check if billed separately X- 1 5 Vital Signs Has the patient been seen at the hospital within the last three years: Yes Total Score: 85 Level Of Care: New/Established - Level 3 Electronic Signature(s) Signed: 07/14/2021 3:36:37 PM By: Carlene Coria RN Entered By: Carlene Coria on 07/14/2021 08:29:49 [name], [name] ([id number]) -------------------------------------------------------------------------------- Encounter Discharge Information Details Patient Name: [name]. Date of Service: 07/14/2021 8:00 AM Medical Record Number: [id number] Patient Account Number: [id number] Date of Birth/Sex: 05-17-1941 (80 y.o. M) Treating  RN: Carlene Coria Primary Care Tacy Chavis: Clayborn Bigness Other Clinician: Referring Caio Devera: Clayborn Bigness Treating Annette Liotta/Extender: Yaakov Guthrie in Treatment: 3 Encounter Discharge Information Items Discharge Condition: Stable Ambulatory Status: Ambulatory Discharge Destination: Home Transportation: Private Auto Accompanied By: self Schedule Follow-up Appointment: Yes Clinical Summary of Care: Patient Declined Electronic Signature(s) Signed: 07/14/2021 3:36:37 PM By: Carlene Coria RN Entered By: Carlene Coria on 07/14/2021 08:35:21 [name], [name] ([id number]) -------------------------------------------------------------------------------- Lower Extremity Assessment Details Patient Name: [name]. Date of Service: 07/14/2021 8:00 AM Medical Record Number: [id number] Patient Account Number: [id number] Date of Birth/Sex: [birth year] (79 y.o. M) Treating RN: Carlene Coria Primary Care Eathen Budreau: Clayborn Bigness Other Clinician: Referring Janneth Krasner: Clayborn Bigness Treating Syan Cullimore/Extender: Yaakov Guthrie in Treatment: 3 Edema Assessment Assessed: [Left: No] [Right: No] Edema: [Left: N] [Right: o] Calf Left: Right: Point of Measurement: 37 cm From Medial Instep 36 cm Ankle Left: Right: Point of Measurement: 10 cm From Medial Instep 25 cm Vascular Assessment Pulses: Dorsalis Pedis Palpable: [Right:Yes] Electronic Signature(s) Signed: 07/14/2021 3:36:37 PM By: Carlene Coria RN Entered By: Carlene Coria on 07/14/2021 08:19:27 [name], [name] ([id number]) -------------------------------------------------------------------------------- Multi Wound Chart Details Patient Name: [name]. Date of Service: 07/14/2021 8:00 AM Medical Record Number: [id number] Patient Account Number: [id number] Date of Birth/Sex: 02/02/1941 (79 y.o. 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 Wound/Ulcer of the Lower N/A Extremity Extremity Comorbid History: Hypertension, Type II Diabetes Hypertension, Type II Diabetes N/A Date Acquired: 06/21/2021 06/21/2021 N/A Weeks of Treatment:  3 3 N/A Wound Status: Open Open N/A Measurements L x W x D (cm) 0x0x0 2.1x0.3x0.1 N/A Area (cm) : 0 0.495 N/A Volume (cm) : 0 0.049 N/A % Reduction in Area: 100.00% 92.10% N/A % Reduction in Volume: 100.00% 96.10% N/A Classification: Grade 2 Grade 2 N/A Exudate Amount: None Present None Present N/A Granulation Amount: None Present (0%) None Present (0%) N/A Necrotic Amount: None Present (0%) None Present (0%) N/A Exposed Structures: Fascia: No Fascia: No N/A Fat Layer (Subcutaneous Tissue): Fat Layer (Subcutaneous Tissue): No No Tendon: No Tendon: No Muscle: No Muscle: No Joint: No Joint: No Bone: No Bone: No Epithelialization: Large (67-100%) None N/A Treatment Notes Wound #1 (Lower Leg) Wound Laterality: Right, Proximal Cleanser Peri-Wound Care Topical Primary Dressing Secondary Dressing Secured With TAEGAN, STANDAGE ([id number]) Compression Wrap Compression Stockings Add-Ons Wound #2 (Lower Leg) Wound Laterality: Right, Distal Cleanser Byram Ancillary Kit - 15 Day Supply Discharge Instruction: Use supplies as instructed; Kit contains: (15) Saline Bullets; (15) 3x3 Gauze; 15 pr Gloves Normal Saline Discharge Instruction: Wash your hands with soap and water. Remove old dressing, discard into plastic bag and place into trash. Cleanse the wound with Normal Saline  prior to applying a clean dressing using gauze sponges, not tissues or cotton balls. Do not scrub or use excessive force. Pat dry using gauze sponges, not tissue or cotton balls. Peri-Wound Care Topical Primary Dressing Hydrofera Blue Ready Transfer Foam, 4x5 (in/in) Discharge Instruction: Apply Hydrofera Blue Ready to wound bed as directed Santyl Collagenase Ointment, 30 (gm), tube Secondary Dressing Zetuvit Plus Silicone Border Dressing 5x5 (in/in) Secured With Compression Wrap Compression Stockings Add-Ons Electronic Signature(s) Signed: 07/14/2021 9:03:27 AM By: Kalman Shan DO Entered By: Kalman Shan on 07/14/2021 08:54:34 Trettin, [name] ([id number]) -------------------------------------------------------------------------------- [place] Details Patient Name: [name]. Date of Service: 07/14/2021 8:00 AM Medical Record Number: [id number] Patient Account Number: [id number] Date of Birth/Sex: 10-Apr-1941 (79 y.o. M) Treating RN: Carlene Coria Primary Care Yaritzel Stange: Clayborn Bigness Other Clinician: Referring Jequan Shahin: Clayborn Bigness Treating Kenniya Westrich/Extender: Yaakov Guthrie in Treatment: 3 Active Inactive Wound/Skin Impairment Nursing Diagnoses: Knowledge deficit related to ulceration/compromised skin integrity Goals: Patient/caregiver will verbalize understanding of skin care regimen Date Initiated: 06/23/2021 Target Resolution Date: 07/24/2021 Goal Status: Active Ulcer/skin breakdown will have a volume reduction of 30% by week 4 Date Initiated: 06/23/2021 Target Resolution Date: 07/24/2021 Goal Status: Active Ulcer/skin breakdown will have a volume reduction of 50% by week 8 Date Initiated: 06/23/2021 Target Resolution Date: 08/23/2021 Goal Status: Active Ulcer/skin breakdown will have a volume reduction of 80% by week 12 Date Initiated: 06/23/2021 Target Resolution Date: 09/23/2021 Goal Status: Active Ulcer/skin breakdown will heal  within 14 weeks Date Initiated: 06/23/2021 Target Resolution Date: 10/23/2021 Goal Status: Active Interventions: Assess patient/caregiver ability to obtain necessary supplies Assess patient/caregiver ability to perform ulcer/skin care regimen upon admission and as needed Assess ulceration(s) every visit Notes: Electronic Signature(s) Signed: 07/14/2021 3:36:37 PM By: Carlene Coria RN Entered By: Carlene Coria on 07/14/2021 08:26:49 [name], [name] ([id number]) -------------------------------------------------------------------------------- Pain Assessment Details Patient Name: [name] Date of Service: 07/14/2021 8:00 AM Medical Record Number: [id number] Patient Account Number: [id number] Date of Birth/Sex: 07-Mar-1941 (79 y.o. M) Treating RN: Carlene Coria Primary Care Fawnda Vitullo: Clayborn Bigness Other Clinician: Referring Ariahna Smiddy: Clayborn Bigness Treating Azizi Bally/Extender: Yaakov Guthrie in Treatment: 3 Active Problems Location of Pain Severity and Description of Pain Patient Has Paino No Site Locations Pain Management and Medication Current Pain Management: Electronic Signature(s) Signed: 07/14/2021 3:36:37 PM  By: Carlene Coria RN Entered By: Carlene Coria on 07/14/2021 08:13:27 [name] ([id number]) -------------------------------------------------------------------------------- Patient/Caregiver Education Details Patient Name: [name] Date of Service: 07/14/2021 8:00 AM Medical Record Number: [id number] Patient Account Number: [id number] Date of Birth/Gender: 08-Dec-1940 (80 y.o. M) Treating RN: Carlene Coria Primary Care Physician: Clayborn Bigness Other Clinician: Referring Physician: Clayborn Bigness Treating Physician/Extender: Yaakov Guthrie in Treatment: 3 Education Assessment Education Provided To: Patient Education Topics Provided Wound/Skin Impairment: Methods: Explain/Verbal Responses: State content correctly Electronic  Signature(s) Signed: 07/14/2021 3:36:37 PM By: Carlene Coria RN Entered By: Carlene Coria on 07/14/2021 08:34:51 [name], [name] ([id number]) -------------------------------------------------------------------------------- Wound Assessment Details Patient Name: [name] Date of Service: 07/14/2021 8:00 AM Medical Record Number: [id number] Patient Account Number: [id number] Date of Birth/Sex: 04/17/1941 (79 y.o. M) Treating RN: Carlene Coria Primary Care Castulo Scarpelli: Clayborn Bigness Other Clinician: Referring Jaeda Bruso: Clayborn Bigness Treating Rayssa Atha/Extender: Yaakov Guthrie in Treatment: 3 Wound Status Wound Number: 1 Primary Etiology: Diabetic Wound/Ulcer of the Lower Extremity Wound Location: Right, Proximal Lower Leg Wound Status: Open Wounding Event: Trauma Comorbid History: Hypertension, Type II Diabetes Date Acquired: 06/21/2021 Weeks Of Treatment: 3 Clustered Wound: No Photos Wound Measurements Length: (cm) 0 Width: (cm) 0 Depth: (cm) 0 Area: (cm) 0 Volume: (cm) 0 % Reduction in Area: 100% % Reduction in Volume: 100% Epithelialization: Large (67-100%) Tunneling: No Undermining: No Wound Description Classification: Grade 2 Exudate Amount: None Present Foul Odor After Cleansing: No Slough/Fibrino No Wound Bed Granulation Amount: None Present (0%) Exposed Structure Necrotic Amount: None Present (0%) Fascia Exposed: No Fat Layer (Subcutaneous Tissue) Exposed: No Tendon Exposed: No Muscle Exposed: No Joint Exposed: No Bone Exposed: No Electronic Signature(s) Signed: 07/14/2021 3:36:37 PM By: Carlene Coria RN Entered By: Carlene Coria on 07/14/2021 08:18:38 [name], [name] ([id number]) -------------------------------------------------------------------------------- Wound Assessment Details Patient Name: [name]. Date of Service: 07/14/2021 8:00 AM Medical Record Number: [id number] Patient Account Number: [id number] Date of Birth/Sex: April 06, 1941  (79 y.o. M) Treating RN: Carlene Coria Primary Care Jalene Demo: Clayborn Bigness Other Clinician: Referring Makayah Pauli: Clayborn Bigness Treating Byran Bilotti/Extender: Yaakov Guthrie in Treatment: 3 Wound Status Wound Number: 2 Primary Etiology: Diabetic Wound/Ulcer of the Lower Extremity Wound Location: Right, Distal Lower Leg Wound Status: Open Wounding Event: Trauma Comorbid History: Hypertension, Type II Diabetes Date Acquired: 06/21/2021 Weeks Of Treatment: 3 Clustered Wound: No Photos Wound Measurements Length: (cm) 2.1 Width: (cm) 0.3 Depth: (cm) 0.1 Area: (cm) 0.495 Volume: (cm) 0.049 % Reduction in Area: 92.1% % Reduction in Volume: 96.1% Epithelialization: None Tunneling: No Undermining: No Wound Description Classification: Grade 2 Exudate Amount: None Present Foul Odor After Cleansing: No Slough/Fibrino No Wound Bed Granulation Amount: None Present (0%) Exposed Structure Necrotic Amount: None Present (0%) Fascia Exposed: No Fat Layer (Subcutaneous Tissue) Exposed: No Tendon Exposed: No Muscle Exposed: No Joint Exposed: No Bone Exposed: No Treatment Notes Wound #2 (Lower Leg) Wound Laterality: Right, Distal Cleanser Byram Ancillary Kit - 15 Day Supply Discharge Instruction: Use supplies as instructed; Kit contains: (15) Saline Bullets; (15) 3x3 Gauze; 15 pr Gloves Normal Saline Discharge Instruction: Wash your hands with soap and water. Remove old dressing, discard into plastic bag and place into trash. Cleanse the wound with Normal Saline prior to applying a clean dressing using gauze sponges, not tissues or cotton balls. Do not [name], Tony R. ([id number]) scrub or use excessive force. Pat dry using gauze sponges, not tissue or cotton balls. Peri-Wound Care Topical Primary Dressing Hydrofera Blue Ready Transfer Foam, 4x5 (in/in) Discharge Instruction:  Apply Hydrofera Blue Ready to wound bed as directed Santyl Collagenase Ointment, 30 (gm), tube Secondary  Dressing Zetuvit Plus Silicone Border Dressing 5x5 (in/in) Secured With Compression Wrap Compression Stockings Add-Ons Electronic Signature(s) Signed: 07/14/2021 3:36:37 PM By: Carlene Coria RN Entered By: Carlene Coria on 07/14/2021 08:19:06 Paredez, [name] ([id number]) -------------------------------------------------------------------------------- Vitals Details Patient Name: [name]. Date of Service: 07/14/2021 8:00 AM Medical Record Number: [id number] Patient Account Number: [id number] Date of Birth/Sex: 23-Oct-1941 (79 y.o. M) Treating RN: Carlene Coria Primary Care Sway Guttierrez: Clayborn Bigness Other Clinician: Referring Knox Holdman: Clayborn Bigness Treating Geeta Dworkin/Extender: Yaakov Guthrie in Treatment: 3 Vital Signs Time Taken: 08:12 Temperature (F): 98.8 Height (in): 71 Pulse (bpm): 94 Weight (lbs): 245 Respiratory Rate (breaths/min): 20 Body Mass Index (BMI): 34.2 Blood Pressure (mmHg): 167/75 Reference Range: 80 - 120 mg / dl Electronic Signature(s) Signed: 07/14/2021 3:36:37 PM By: Carlene Coria RN Entered By: Carlene Coria on 07/14/2021 08:13:12

## 2021-07-14 NOTE — Progress Notes (Signed)
UDAY, JANTZ ([id number]) Visit Report for 07/14/2021 Chief Complaint Document Details Patient Name: [name], [name]. Date of Service: 07/14/2021 8:00 AM Medical Record Number: [id number] Patient Account Number: [id number] Date of Birth/Sex: 1941/04/25 (79 y.o. M) Treating RN: Carlene Coria Primary Care Provider: Clayborn Bigness Other Clinician: Referring Provider: Clayborn Bigness Treating Provider/Extender: Yaakov Guthrie in Treatment: 3 Information Obtained from: Patient Chief Complaint Right lower extremity trauma wound Electronic Signature(s) Signed: 07/14/2021 9:03:27 AM By: Kalman Shan DO Entered By: Kalman Shan on 07/14/2021 08:54:45 Popov, Leanor Kail ([id number]) -------------------------------------------------------------------------------- HPI Details Patient Name: [name] Date of Service: 07/14/2021 8:00 AM Medical Record Number: [id number] Patient Account Number: [id number] Date of Birth/Sex: 09-07-1941 (79 y.o. M) Treating RN: Carlene Coria Primary Care Provider: Clayborn Bigness Other Clinician: Referring Provider: Clayborn Bigness Treating Provider/Extender: Yaakov Guthrie in Treatment: 3 History of Present Illness HPI Description: Admission 8/24 Rudi Bunyard is an 80 year old male with a past medical history of insulin-dependent type 2 diabetes, essential hypertension, and total knee arthroplasty that presents to the clinic for A 2-day history of lower extremity wound that has developed redness to the surrounding wound bed. He states that the redness started in the past day. He states he was evaluated today by his primary care provider for a wellness visit and the wound was evaluated and he was started on Bactrim. He states he has taken a dose already. He denies drainage or pain. He currently denies systemic signs of infection. 8/31; patient presents for 1 week follow-up. He reports completing Bactrim and has noticed great improvement in his leg  redness and warmth. He reports that this has resolved. He denies pain or drainage. He has been using Hydrofera Blue every other day to the wound bed. 9/7; patient presents for 1 week follow-up. He has no issues or complaints today. He continues to use Hydrofera Blue to the wound beds. He denies signs of infection. 9/14; patient presents for 1 week follow-up. He has no issues or complaints today. He continues to use Hydrofera Blue to the wound beds. He denies signs of infection. He continues to stay active. Electronic Signature(s) Signed: 07/14/2021 9:03:27 AM By: Kalman Shan DO Entered By: Kalman Shan on 07/14/2021 08:55:17 Dales, Leanor Kail ([id number]) -------------------------------------------------------------------------------- Physical Exam Details Patient Name: NGHIA, MCENTEE. Date of Service: 07/14/2021 8:00 AM Medical Record Number: [id number] Patient Account Number: [id number] Date of Birth/Sex: Sep 19, 1941 (79 y.o. M) Treating RN: Carlene Coria Primary Care Provider: Clayborn Bigness Other Clinician: Referring Provider: Clayborn Bigness Treating Provider/Extender: Yaakov Guthrie in Treatment: 3 Constitutional . Cardiovascular . Psychiatric . Notes Right lower extremity: To the most proximal wound there is epithelialization. To the most distal wound there is granulation tissue and scant nonviable tissue present. No signs of infection. 2+ pitting edema to the knee. Electronic Signature(s) Signed: 07/14/2021 9:03:27 AM By: Kalman Shan DO Entered By: Kalman Shan on 07/14/2021 08:56:05 Lenderman, Leanor Kail ([id number]) -------------------------------------------------------------------------------- Physician Orders Details Patient Name: [name]. Date of Service: 07/14/2021 8:00 AM Medical Record Number: [id number] Patient Account Number: [id number] Date of Birth/Sex: Dec 19, 1940 (79 y.o. M) Treating RN: Carlene Coria Primary Care Provider: Clayborn Bigness  Other Clinician: Referring Provider: Clayborn Bigness Treating Provider/Extender: Yaakov Guthrie in Treatment: 3 Verbal / Phone Orders: No Diagnosis Coding Follow-up Appointments o Return Appointment in 2 weeks. Bathing/ Shower/ Hygiene o May shower with wound dressing protected with water repellent cover or cast protector. Edema Control - Lymphedema / Segmental Compressive Device / Other o Elevate, Exercise Daily  and Avoid Standing for Long Periods of Time. o Elevate legs to the level of the heart and pump ankles as often as possible o Elevate leg(s) parallel to the floor when sitting. Wound Treatment Wound #2 - Lower Leg Wound Laterality: Right, Distal Cleanser: Byram Ancillary Kit - 15 Day Supply (Generic) Every Other Day/30 Days Discharge Instructions: Use supplies as instructed; Kit contains: (15) Saline Bullets; (15) 3x3 Gauze; 15 pr Gloves Cleanser: Normal Saline (Generic) Every Other Day/30 Days Discharge Instructions: Wash your hands with soap and water. Remove old dressing, discard into plastic bag and place into trash. Cleanse the wound with Normal Saline prior to applying a clean dressing using gauze sponges, not tissues or cotton balls. Do not scrub or use excessive force. Pat dry using gauze sponges, not tissue or cotton balls. Primary Dressing: Hydrofera Blue Ready Transfer Foam, 4x5 (in/in) (Generic) Every Other Day/30 Days Discharge Instructions: Apply Hydrofera Blue Ready to wound bed as directed Primary Dressing: Santyl Collagenase Ointment, 30 (gm), tube Every Other Day/30 Days Secondary Dressing: Zetuvit Plus Silicone Border Dressing 5x5 (in/in) (Generic) Every Other Day/30 Days Electronic Signature(s) Signed: 07/14/2021 9:03:27 AM By: Kalman Shan DO Signed: 07/14/2021 3:36:37 PM By: Carlene Coria RN Entered By: Carlene Coria on 07/14/2021 08:30:17 Tinner, Leanor Kail  ([id number]) -------------------------------------------------------------------------------- Problem List Details Patient Name: CURTIES, CONIGLIARO. Date of Service: 07/14/2021 8:00 AM Medical Record Number: [id number] Patient Account Number: [id number] Date of Birth/Sex: [birth year] (79 y.o. M) Treating RN: Carlene Coria Primary Care Provider: Clayborn Bigness Other Clinician: Referring Provider: Clayborn Bigness Treating Provider/Extender: Yaakov Guthrie in Treatment: 3 Active Problems ICD-10 Encounter Code Description Active Date MDM Diagnosis S81.801D Unspecified open wound, right lower leg, subsequent encounter 06/30/2021 No Yes E11.9 Type 2 diabetes mellitus without complications 1/69/6789 No Yes I10 Essential (primary) hypertension 06/23/2021 No Yes N18.30 Chronic kidney disease, stage 3 unspecified 06/23/2021 No Yes Z96.659 Presence of unspecified artificial knee joint 06/23/2021 No Yes Inactive Problems ICD-10 Code Description Active Date Inactive Date S81.801A Unspecified open wound, right lower leg, initial encounter 06/23/2021 06/23/2021 Resolved Problems Electronic Signature(s) Signed: 07/14/2021 9:03:27 AM By: Kalman Shan DO Entered By: Kalman Shan on 07/14/2021 08:54:26 Beadle, Leanor Kail ([id number]) -------------------------------------------------------------------------------- Progress Note Details Patient Name: [name]. Date of Service: 07/14/2021 8:00 AM Medical Record Number: [id number] Patient Account Number: [id number] Date of Birth/Sex: 27-Mar-1941 (79 y.o. M) Treating RN: Carlene Coria Primary Care Provider: Clayborn Bigness Other Clinician: Referring Provider: Clayborn Bigness Treating Provider/Extender: Yaakov Guthrie in Treatment: 3 Subjective Chief Complaint Information obtained from Patient Right lower extremity trauma wound History of Present Illness (HPI) Admission 8/24 Cebert Dettmann is an 80 year old male with a past medical history of  insulin-dependent type 2 diabetes, essential hypertension, and total knee arthroplasty that presents to the clinic for A 2-day history of lower extremity wound that has developed redness to the surrounding wound bed. He states that the redness started in the past day. He states he was evaluated today by his primary care provider for a wellness visit and the wound was evaluated and he was started on Bactrim. He states he has taken a dose already. He denies drainage or pain. He currently denies systemic signs of infection. 8/31; patient presents for 1 week follow-up. He reports completing Bactrim and has noticed great improvement in his leg redness and warmth. He reports that this has resolved. He denies pain or drainage. He has been using Hydrofera Blue every other day to the wound bed. 9/7; patient presents for 1 week  follow-up. He has no issues or complaints today. He continues to use Hydrofera Blue to the wound beds. He denies signs of infection. 9/14; patient presents for 1 week follow-up. He has no issues or complaints today. He continues to use Hydrofera Blue to the wound beds. He denies signs of infection. He continues to stay active. Patient History Information obtained from Patient. Social History Former smoker, Marital Status - Married, Alcohol Use - Rarely, Drug Use - No History, Caffeine Use - Daily. Medical History Cardiovascular Patient has history of Hypertension Endocrine Patient has history of Type II Diabetes Objective Constitutional Vitals Time Taken: 8:12 AM, Height: 71 in, Weight: 245 lbs, BMI: 34.2, Temperature: 98.8 F, Pulse: 94 bpm, Respiratory Rate: 20 breaths/min, Blood Pressure: 167/75 mmHg. General Notes: Right lower extremity: To the most proximal wound there is epithelialization. To the most distal wound there is granulation tissue and scant nonviable tissue present. No signs of infection. 2+ pitting edema to the knee. Integumentary (Hair, Skin) Wound #1  status is Open. Original cause of wound was Trauma. The date acquired was: 06/21/2021. The wound has been in treatment 3 weeks. The wound is located on the Right,Proximal Lower Leg. The wound measures 0cm length x 0cm width x 0cm depth; 0cm^2 area and 0cm^3 volume. There is no tunneling or undermining noted. There is a none present amount of drainage noted. There is no granulation within the wound bed. There is no necrotic tissue within the wound bed. Wound #2 status is Open. Original cause of wound was Trauma. The date acquired was: 06/21/2021. The wound has been in treatment 3 weeks. The wound is located on the Right,Distal Lower Leg. The wound measures 2.1cm length x 0.3cm width x 0.1cm depth; 0.495cm^2 area and 0.049cm^3 volume. There is no tunneling or undermining noted. There is a none present amount of drainage noted. There is no granulation Alexa, Nataniel R. ([id number]) within the wound bed. There is no necrotic tissue within the wound bed. Assessment Active Problems ICD-10 Unspecified open wound, right lower leg, subsequent encounter Type 2 diabetes mellitus without complications Essential (primary) hypertension Chronic kidney disease, stage 3 unspecified Presence of unspecified artificial knee joint Patient's wounds have shown improvement in size and appearance since last clinic visit. The most proximal wound is healed. I recommended he putting Vaseline or lotion on this daily. For the most distal wound I recommended Hydrofera Blue. We will place Santyl in the office only. Follow- up in 2 weeks. I am hopeful the wound will be closed by then. Plan Follow-up Appointments: Return Appointment in 2 weeks. Bathing/ Shower/ Hygiene: May shower with wound dressing protected with water repellent cover or cast protector. Edema Control - Lymphedema / Segmental Compressive Device / Other: Elevate, Exercise Daily and Avoid Standing for Long Periods of Time. Elevate legs to the level of the  heart and pump ankles as often as possible Elevate leg(s) parallel to the floor when sitting. WOUND #2: - Lower Leg Wound Laterality: Right, Distal Cleanser: Byram Ancillary Kit - 15 Day Supply (Generic) Every Other Day/30 Days Discharge Instructions: Use supplies as instructed; Kit contains: (15) Saline Bullets; (15) 3x3 Gauze; 15 pr Gloves Cleanser: Normal Saline (Generic) Every Other Day/30 Days Discharge Instructions: Wash your hands with soap and water. Remove old dressing, discard into plastic bag and place into trash. Cleanse the wound with Normal Saline prior to applying a clean dressing using gauze sponges, not tissues or cotton balls. Do not scrub or use excessive force. Pat dry using gauze sponges,  not tissue or cotton balls. Primary Dressing: Hydrofera Blue Ready Transfer Foam, 4x5 (in/in) (Generic) Every Other Day/30 Days Discharge Instructions: Apply Hydrofera Blue Ready to wound bed as directed Primary Dressing: Santyl Collagenase Ointment, 30 (gm), tube Every Other Day/30 Days Secondary Dressing: Zetuvit Plus Silicone Border Dressing 5x5 (in/in) (Generic) Every Other Day/30 Days 1. Hydrofera Blue 2. Follow-up in 2 weeks Electronic Signature(s) Signed: 07/14/2021 9:03:27 AM By: Kalman Shan DO Entered By: Kalman Shan on 07/14/2021 08:58:28 Keeven, Leanor Kail ([id number]) -------------------------------------------------------------------------------- ROS/PFSH Details Patient Name: [name]. Date of Service: 07/14/2021 8:00 AM Medical Record Number: [id number] Patient Account Number: [id number] Date of Birth/Sex: 03-Jul-1941 (79 y.o. M) Treating RN: Carlene Coria Primary Care Provider: Clayborn Bigness Other Clinician: Referring Provider: Clayborn Bigness Treating Provider/Extender: Yaakov Guthrie in Treatment: 3 Information Obtained From Patient Cardiovascular Medical History: Positive for: Hypertension Endocrine Medical History: Positive for: Type II  Diabetes Time with diabetes: 38 Treated with: Insulin, Oral agents Blood sugar tested every day: No Immunizations Pneumococcal Vaccine: Received Pneumococcal Vaccination: No Implantable Devices None Family and Social History Former smoker; Marital Status - Married; Alcohol Use: Rarely; Drug Use: No History; Caffeine Use: Daily; Financial Concerns: No; Food, Clothing or Shelter Needs: No; Support System Lacking: No; Transportation Concerns: No Electronic Signature(s) Signed: 07/14/2021 9:03:27 AM By: Kalman Shan DO Signed: 07/14/2021 3:36:37 PM By: Carlene Coria RN Entered By: Kalman Shan on 07/14/2021 08:55:23 Manas, Leanor Kail ([id number]) -------------------------------------------------------------------------------- SuperBill Details Patient Name: [name]. Date of Service: 07/14/2021 Medical Record Number: [id number] Patient Account Number: [id number] Date of Birth/Sex: July 25, 1941 (80 y.o. M) Treating RN: Carlene Coria Primary Care Provider: Clayborn Bigness Other Clinician: Referring Provider: Clayborn Bigness Treating Provider/Extender: Yaakov Guthrie in Treatment: 3 Diagnosis Coding ICD-10 Codes Code Description [phone] Unspecified open wound, right lower leg, subsequent encounter E11.9 Type 2 diabetes mellitus without complications X64 Essential (primary) hypertension N18.30 Chronic kidney disease, stage 3 unspecified Z96.659 Presence of unspecified artificial knee joint Facility Procedures CPT4 Code: 68032122 Description: 99213 - WOUND CARE VISIT-LEV 3 EST PT Modifier: Quantity: 1 Physician Procedures CPT4 Code: 4825003 Description: 70488 - WC PHYS LEVEL 3 - EST PT Modifier: Quantity: 1 CPT4 Code: Description: ICD-10 Diagnosis Description S81.801D Unspecified open wound, right lower leg, subsequent encounter E11.9 Type 2 diabetes mellitus without complications Q91.69 Chronic kidney disease, stage 3 unspecified I10 Essential (primary)  hypertension Modifier: Quantity: Electronic Signature(s) Signed: 07/14/2021 9:03:27 AM By: Kalman Shan DO Entered By: Kalman Shan on 07/14/2021 09:02:07

## 2021-07-14 NOTE — Progress Notes (Signed)
BENN, TARVER ([id number]) Visit Report for 07/07/2021 Arrival Information Details Patient Name: [name], [name]. Date of Service: 07/07/2021 10:15 AM Medical Record Number: [id number] Patient Account Number: [id number] Date of Birth/Sex: 07/10/1941 (80 y.o. M) Treating RN: Carlene Coria Primary Care Neri Samek: Clayborn Bigness Other Clinician: Referring Jayliana Valencia: Clayborn Bigness Treating Aadhira Heffernan/Extender: Yaakov Guthrie in Treatment: 2 Visit Information History Since Last Visit All ordered tests and consults were completed: No Patient Arrived: Ambulatory Added or deleted any medications: No Arrival Time: 10:14 Any new allergies or adverse reactions: No Accompanied By: self Had a fall or experienced change in No Transfer Assistance: None activities of daily living that may affect Patient Identification Verified: Yes risk of falls: Secondary Verification Process Completed: Yes Signs or symptoms of abuse/neglect since last visito No Patient Requires Transmission-Based Precautions: No Hospitalized since last visit: No Patient Has Alerts: Yes Implantable device outside of the clinic excluding No Patient Alerts: non compressable cellular tissue based products placed in the center since last visit: Has Dressing in Place as Prescribed: Yes Pain Present Now: No Electronic Signature(s) Signed: 07/14/2021 7:54:52 AM By: Carlene Coria RN Entered By: Carlene Coria on 07/07/2021 10:15:08 [name], [name] ([id number]) -------------------------------------------------------------------------------- Encounter Discharge Information Details Patient Name: [name] Date of Service: 07/07/2021 10:15 AM Medical Record Number: [id number] Patient Account Number: [id number] Date of Birth/Sex: May 19, 1941 (81 y.o. M) Treating RN: Carlene Coria Primary Care Michaella Imai: Clayborn Bigness Other Clinician: Referring Raquell Richer: Clayborn Bigness Treating Celestina Gironda/Extender: Yaakov Guthrie in Treatment:  2 Encounter Discharge Information Items Post Procedure Vitals Discharge Condition: Stable Temperature (F): 98.5 Ambulatory Status: Ambulatory Pulse (bpm): 103 Discharge Destination: Home Respiratory Rate (breaths/min): 18 Transportation: Private Auto Blood Pressure (mmHg): 162/88 Accompanied By: self Schedule Follow-up Appointment: Yes Clinical Summary of Care: Patient Declined Electronic Signature(s) Signed: 07/14/2021 7:54:52 AM By: Carlene Coria RN Entered By: Carlene Coria on 07/07/2021 10:40:40 Folker, [name] ([id number]) -------------------------------------------------------------------------------- Lower Extremity Assessment Details Patient Name: [name]. Date of Service: 07/07/2021 10:15 AM Medical Record Number: [id number] Patient Account Number: [id number] Date of Birth/Sex: [birth year] (80 y.o. M) Treating RN: Carlene Coria Primary Care Annaya Bangert: Clayborn Bigness Other Clinician: Referring Iosefa Weintraub: Clayborn Bigness Treating Oluwatoni Rotunno/Extender: Yaakov Guthrie in Treatment: 2 Edema Assessment Assessed: [Left: No] [Right: No] [Left: Edema] [Right: :] Calf Left: Right: Point of Measurement: 37 cm From Medial Instep 37 cm Ankle Left: Right: Point of Measurement: 10 cm From Medial Instep 26 cm Vascular Assessment Pulses: Dorsalis Pedis Palpable: [Right:Yes] Electronic Signature(s) Signed: 07/14/2021 7:54:52 AM By: Carlene Coria RN Entered By: Carlene Coria on 07/07/2021 10:26:53 Krahenbuhl, [name] ([id number]) -------------------------------------------------------------------------------- Multi Wound Chart Details Patient Name: [name]. Date of Service: 07/07/2021 10:15 AM Medical Record Number: [id number] Patient Account Number: [id number] Date of Birth/Sex: 1940/11/07 (80 y.o. M) Treating RN: Carlene Coria Primary Care Allister Lessley: Clayborn Bigness Other Clinician: Referring Janayla Marik: Clayborn Bigness Treating Carlye Panameno/Extender: Yaakov Guthrie in  Treatment: 2 Vital Signs Height(in): 71 Pulse(bpm): 103 Weight(lbs): 245 Blood Pressure(mmHg): 162/88 Body Mass Index(BMI): 34 Temperature(F): 98.5 Respiratory Rate(breaths/min): 18 Photos: [N/A:N/A] Wound Location: Right, Proximal Lower Leg Right, Distal Lower Leg N/A Wounding Event: Trauma Trauma N/A Primary Etiology: Diabetic Wound/Ulcer of the Lower Diabetic Wound/Ulcer of the Lower N/A Extremity Extremity Comorbid History: Hypertension, Type II Diabetes Hypertension, Type II Diabetes N/A Date Acquired: 06/21/2021 06/21/2021 N/A Weeks of Treatment: 2 2 N/A Wound Status: Open Open N/A Measurements L x W x D (cm) 10x0.3x0.1 3x1x0.1 N/A Area (cm) : 2.356 2.356 N/A Volume (cm) : 0.236  0.236 N/A % Reduction in Area: 79.30% 62.50% N/A % Reduction in Volume: 89.70% 81.20% N/A Classification: Grade 2 Grade 2 N/A Exudate Amount: Medium Medium N/A Exudate Type: Serosanguineous Serosanguineous N/A Exudate Color: red, brown red, brown N/A Granulation Amount: Large (67-100%) Medium (34-66%) N/A Granulation Quality: Red Red N/A Necrotic Amount: Small (1-33%) Medium (34-66%) N/A Exposed Structures: Fat Layer (Subcutaneous Tissue): Fat Layer (Subcutaneous Tissue): N/A Yes Yes Fascia: No Fascia: No Tendon: No Tendon: No Muscle: No Muscle: No Joint: No Joint: No Bone: No Bone: No Epithelialization: None None N/A Debridement: N/A Debridement - Excisional N/A Pre-procedure Verification/Time N/A 10:29 N/A Out Taken: Tissue Debrided: N/A Subcutaneous, Slough N/A Level: N/A Skin/Subcutaneous Tissue N/A Debridement Area (sq cm): N/A 3 N/A Instrument: N/A Curette N/A Bleeding: N/A Moderate N/A Hemostasis Achieved: N/A Pressure N/A Procedural Pain: N/A 0 N/A Post Procedural Pain: N/A 0 N/A Debridement Treatment N/A Procedure was tolerated well N/A Response: N/A 3x1x0.1 N/A MABRY, TIFT ([id number]) Post Debridement Measurements L x W x D (cm) Post Debridement Volume: N/A  0.236 N/A (cm) Procedures Performed: N/A Debridement N/A Treatment Notes Electronic Signature(s) Signed: 07/07/2021 10:41:12 AM By: Kalman Shan DO Entered By: Kalman Shan on 07/07/2021 10:34:28 Chery, [name] ([id number]) -------------------------------------------------------------------------------- [place] Details Patient Name: [name], [name]. Date of Service: 07/07/2021 10:15 AM Medical Record Number: [id number] Patient Account Number: [id number] Date of Birth/Sex: 12/19/1940 (80 y.o. M) Treating RN: Carlene Coria Primary Care Greenleigh Kauth: Clayborn Bigness Other Clinician: Referring Kayzlee Wirtanen: Clayborn Bigness Treating Muhammed Teutsch/Extender: Yaakov Guthrie in Treatment: 2 Active Inactive Wound/Skin Impairment Nursing Diagnoses: Knowledge deficit related to ulceration/compromised skin integrity Goals: Patient/caregiver will verbalize understanding of skin care regimen Date Initiated: 06/23/2021 Target Resolution Date: 07/24/2021 Goal Status: Active Ulcer/skin breakdown will have a volume reduction of 30% by week 4 Date Initiated: 06/23/2021 Target Resolution Date: 07/24/2021 Goal Status: Active Ulcer/skin breakdown will have a volume reduction of 50% by week 8 Date Initiated: 06/23/2021 Target Resolution Date: 08/23/2021 Goal Status: Active Ulcer/skin breakdown will have a volume reduction of 80% by week 12 Date Initiated: 06/23/2021 Target Resolution Date: 09/23/2021 Goal Status: Active Ulcer/skin breakdown will heal within 14 weeks Date Initiated: 06/23/2021 Target Resolution Date: 10/23/2021 Goal Status: Active Interventions: Assess patient/caregiver ability to obtain necessary supplies Assess patient/caregiver ability to perform ulcer/skin care regimen upon admission and as needed Assess ulceration(s) every visit Notes: Electronic Signature(s) Signed: 07/14/2021 7:54:52 AM By: Carlene Coria RN Entered By: Carlene Coria on 07/07/2021  10:27:25 Fuhs, [name] ([id number]) -------------------------------------------------------------------------------- Pain Assessment Details Patient Name: [name] Date of Service: 07/07/2021 10:15 AM Medical Record Number: [id number] Patient Account Number: [id number] Date of Birth/Sex: Nov 24, 1940 (80 y.o. M) Treating RN: Carlene Coria Primary Care Anav Lammert: Clayborn Bigness Other Clinician: Referring Sarafina Puthoff: Clayborn Bigness Treating Jahson Emanuele/Extender: Yaakov Guthrie in Treatment: 2 Active Problems Location of Pain Severity and Description of Pain Patient Has Paino No Site Locations Pain Management and Medication Current Pain Management: Electronic Signature(s) Signed: 07/14/2021 7:54:52 AM By: Carlene Coria RN Entered By: Carlene Coria on 07/07/2021 10:15:39 Vaughn, [name] ([id number]) -------------------------------------------------------------------------------- Wound Assessment Details Patient Name: [name]. Date of Service: 07/07/2021 10:15 AM Medical Record Number: [id number] Patient Account Number: [id number] Date of Birth/Sex: Oct 07, 1941 (80 y.o. M) Treating RN: Carlene Coria Primary Care Kilah Drahos: Clayborn Bigness Other Clinician: Referring Sela Falk: Clayborn Bigness Treating Reginia Battie/Extender: Yaakov Guthrie in Treatment: 2 Wound Status Wound Number: 1 Primary Etiology: Diabetic Wound/Ulcer of the Lower Extremity Wound Location: Right, Proximal Lower Leg Wound Status: Open  Wounding Event: Trauma Comorbid History: Hypertension, Type II Diabetes Date Acquired: 06/21/2021 Weeks Of Treatment: 2 Clustered Wound: No Photos Wound Measurements Length: (cm) 10 Width: (cm) 0.3 Depth: (cm) 0.1 Area: (cm) 2.356 Volume: (cm) 0.236 % Reduction in Area: 79.3% % Reduction in Volume: 89.7% Epithelialization: None Tunneling: No Undermining: No Wound Description Classification: Grade 2 Exudate Amount: Medium Exudate Type: Serosanguineous Exudate Color:  red, brown Foul Odor After Cleansing: No Slough/Fibrino Yes Wound Bed Granulation Amount: Large (67-100%) Exposed Structure Granulation Quality: Red Fascia Exposed: No Necrotic Amount: Small (1-33%) Fat Layer (Subcutaneous Tissue) Exposed: Yes Necrotic Quality: Adherent Slough Tendon Exposed: No Muscle Exposed: No Joint Exposed: No Bone Exposed: No Treatment Notes Wound #1 (Lower Leg) Wound Laterality: Right, Proximal Cleanser Byram Ancillary Kit - 15 Day Supply Discharge Instruction: Use supplies as instructed; Kit contains: (15) Saline Bullets; (15) 3x3 Gauze; 15 pr Gloves Normal Saline Discharge Instruction: Wash your hands with soap and water. Remove old dressing, discard into plastic bag and place into trash. Cleanse the wound with Normal Saline prior to applying a clean dressing using gauze sponges, not tissues or cotton balls. Do not Peretti, Tyrice R. ([id number]) scrub or use excessive force. Pat dry using gauze sponges, not tissue or cotton balls. Peri-Wound Care Topical Primary Dressing Hydrofera Blue Ready Transfer Foam, 4x5 (in/in) Discharge Instruction: Apply Hydrofera Blue Ready to wound bed as directed Santyl Collagenase Ointment, 30 (gm), tube Secondary Dressing Zetuvit Plus Silicone Border Dressing 5x5 (in/in) Secured With Compression Wrap Compression Stockings Add-Ons Electronic Signature(s) Signed: 07/14/2021 7:54:52 AM By: Carlene Coria RN Entered By: Carlene Coria on 07/07/2021 10:23:28 Zettlemoyer, [name] ([id number]) -------------------------------------------------------------------------------- Wound Assessment Details Patient Name: [name]. Date of Service: 07/07/2021 10:15 AM Medical Record Number: [id number] Patient Account Number: [id number] Date of Birth/Sex: 12/19/1940 (80 y.o. M) Treating RN: Carlene Coria Primary Care Aryan Bello: Clayborn Bigness Other Clinician: Referring Damar Petit: Clayborn Bigness Treating Jhalil Silvera/Extender: Yaakov Guthrie in Treatment: 2 Wound Status Wound Number: 2 Primary Etiology: Diabetic Wound/Ulcer of the Lower Extremity Wound Location: Right, Distal Lower Leg Wound Status: Open Wounding Event: Trauma Comorbid History: Hypertension, Type II Diabetes Date Acquired: 06/21/2021 Weeks Of Treatment: 2 Clustered Wound: No Photos Wound Measurements Length: (cm) 3 Width: (cm) 1 Depth: (cm) 0.1 Area: (cm) 2.356 Volume: (cm) 0.236 % Reduction in Area: 62.5% % Reduction in Volume: 81.2% Epithelialization: None Tunneling: No Undermining: No Wound Description Classification: Grade 2 Exudate Amount: Medium Exudate Type: Serosanguineous Exudate Color: red, brown Foul Odor After Cleansing: No Slough/Fibrino Yes Wound Bed Granulation Amount: Medium (34-66%) Exposed Structure Granulation Quality: Red Fascia Exposed: No Necrotic Amount: Medium (34-66%) Fat Layer (Subcutaneous Tissue) Exposed: Yes Necrotic Quality: Adherent Slough Tendon Exposed: No Muscle Exposed: No Joint Exposed: No Bone Exposed: No Treatment Notes Wound #2 (Lower Leg) Wound Laterality: Right, Distal Cleanser Byram Ancillary Kit - 15 Day Supply Discharge Instruction: Use supplies as instructed; Kit contains: (15) Saline Bullets; (15) 3x3 Gauze; 15 pr Gloves Normal Saline Discharge Instruction: Wash your hands with soap and water. Remove old dressing, discard into plastic bag and place into trash. Cleanse the wound with Normal Saline prior to applying a clean dressing using gauze sponges, not tissues or cotton balls. Do not Pitera, Leeman R. ([id number]) scrub or use excessive force. Pat dry using gauze sponges, not tissue or cotton balls. Peri-Wound Care Topical Primary Dressing Hydrofera Blue Ready Transfer Foam, 4x5 (in/in) Discharge Instruction: Apply Hydrofera Blue Ready to wound bed as directed Santyl Collagenase Ointment, 30 (gm), tube Secondary Dressing  Zetuvit Plus Silicone Border Dressing 5x5  (in/in) Secured With Compression Wrap Compression Stockings Environmental education officer) Signed: 07/14/2021 7:54:52 AM By: Carlene Coria RN Entered By: Carlene Coria on 07/07/2021 10:23:56 Rueter, [name] ([id number]) -------------------------------------------------------------------------------- Vitals Details Patient Name: [name]. Date of Service: 07/07/2021 10:15 AM Medical Record Number: [id number] Patient Account Number: [id number] Date of Birth/Sex: August 31, 1941 (80 y.o. M) Treating RN: Carlene Coria Primary Care Markeeta Scalf: Clayborn Bigness Other Clinician: Referring Nour Scalise: Clayborn Bigness Treating Aubrynn Katona/Extender: Yaakov Guthrie in Treatment: 2 Vital Signs Time Taken: 10:15 Temperature (F): 98.5 Height (in): 71 Pulse (bpm): 103 Weight (lbs): 245 Respiratory Rate (breaths/min): 18 Body Mass Index (BMI): 34.2 Blood Pressure (mmHg): 162/88 Reference Range: 80 - 120 mg / dl Electronic Signature(s) Signed: 07/14/2021 7:54:52 AM By: Carlene Coria RN Entered By: Carlene Coria on 07/07/2021 10:15:32

## 2021-07-21 ENCOUNTER — Encounter: Payer: Medicare HMO | Admitting: Internal Medicine

## 2021-07-28 ENCOUNTER — Other Ambulatory Visit: Payer: Self-pay

## 2021-07-28 ENCOUNTER — Encounter (HOSPITAL_BASED_OUTPATIENT_CLINIC_OR_DEPARTMENT_OTHER): Payer: Medicare HMO | Admitting: Internal Medicine

## 2021-07-28 DIAGNOSIS — Z794 Long term (current) use of insulin: Secondary | ICD-10-CM | POA: Diagnosis not present

## 2021-07-28 DIAGNOSIS — Z96659 Presence of unspecified artificial knee joint: Secondary | ICD-10-CM

## 2021-07-28 DIAGNOSIS — E119 Type 2 diabetes mellitus without complications: Secondary | ICD-10-CM | POA: Diagnosis not present

## 2021-07-28 DIAGNOSIS — I10 Essential (primary) hypertension: Secondary | ICD-10-CM

## 2021-07-28 DIAGNOSIS — E1122 Type 2 diabetes mellitus with diabetic chronic kidney disease: Secondary | ICD-10-CM | POA: Diagnosis not present

## 2021-07-28 DIAGNOSIS — Z87891 Personal history of nicotine dependence: Secondary | ICD-10-CM | POA: Diagnosis not present

## 2021-07-28 DIAGNOSIS — X58XXXD Exposure to other specified factors, subsequent encounter: Secondary | ICD-10-CM | POA: Diagnosis not present

## 2021-07-28 DIAGNOSIS — I129 Hypertensive chronic kidney disease with stage 1 through stage 4 chronic kidney disease, or unspecified chronic kidney disease: Secondary | ICD-10-CM | POA: Diagnosis not present

## 2021-07-28 DIAGNOSIS — N183 Chronic kidney disease, stage 3 unspecified: Secondary | ICD-10-CM | POA: Diagnosis not present

## 2021-07-28 DIAGNOSIS — S81801D Unspecified open wound, right lower leg, subsequent encounter: Secondary | ICD-10-CM

## 2021-07-29 NOTE — Progress Notes (Signed)
HELAMAN, MECCA ([id number]) Visit Report for 07/28/2021 Arrival Information Details Patient Name: [name], [name]. Date of Service: 07/28/2021 11:00 AM Medical Record Number: [id number] Patient Account Number: [id number] Date of Birth/Sex: 01-Mar-1941 (80 y.o. M) Treating RN: Yevonne Pax Primary Care Nimesh Riolo: Beverely Risen Other Clinician: Referring Hady Niemczyk: Beverely Risen Treating Brady Plant/Extender: Tilda Franco in Treatment: 5 Visit Information History Since Last Visit All ordered tests and consults were completed: No Patient Arrived: Ambulatory Added or deleted any medications: No Arrival Time: 11:00 Any new allergies or adverse reactions: No Accompanied By: self Had a fall or experienced change in No Transfer Assistance: None activities of daily living that may affect Patient Identification Verified: Yes risk of falls: Secondary Verification Process Completed: Yes Signs or symptoms of abuse/neglect since last visito No Patient Requires Transmission-Based Precautions: No Hospitalized since last visit: No Patient Has Alerts: Yes Implantable device outside of the clinic excluding No Patient Alerts: non compressable cellular tissue based products placed in the center since last visit: Has Dressing in Place as Prescribed: Yes Pain Present Now: No Electronic Signature(s) Signed: 07/29/2021 5:08:20 PM By: Yevonne Pax RN Entered By: Yevonne Pax on 07/28/2021 11:06:50 [name], [name] ([id number]) -------------------------------------------------------------------------------- Clinic Level of Care Assessment Details Patient Name: [name] Date of Service: 07/28/2021 11:00 AM Medical Record Number: [id number] Patient Account Number: [id number] Date of Birth/Sex: 12/29/1940 (80 y.o. M) Treating RN: Yevonne Pax Primary Care Jonael Paradiso: Beverely Risen Other Clinician: Referring Pollyanna Levay: Beverely Risen Treating Jiah Bari/Extender: Tilda Franco in Treatment:  5 Clinic Level of Care Assessment Items TOOL 4 Quantity Score X - Use when only an EandM is performed on FOLLOW-UP visit 1 0 ASSESSMENTS - Nursing Assessment / Reassessment X - Reassessment of Co-morbidities (includes updates in patient status) 1 10 X- 1 5 Reassessment of Adherence to Treatment Plan ASSESSMENTS - Wound and Skin Assessment / Reassessment X - Simple Wound Assessment / Reassessment - one wound 1 5 []  - 0 Complex Wound Assessment / Reassessment - multiple wounds []  - 0 Dermatologic / Skin Assessment (not related to wound area) ASSESSMENTS - Focused Assessment []  - Circumferential Edema Measurements - multi extremities 0 []  - 0 Nutritional Assessment / Counseling / Intervention []  - 0 Lower Extremity Assessment (monofilament, tuning fork, pulses) []  - 0 Peripheral Arterial Disease Assessment (using hand held doppler) ASSESSMENTS - Ostomy and/or Continence Assessment and Care []  - Incontinence Assessment and Management 0 []  - 0 Ostomy Care Assessment and Management (repouching, etc.) PROCESS - Coordination of Care X - Simple Patient / Family Education for ongoing care 1 15 []  - 0 Complex (extensive) Patient / Family Education for ongoing care []  - 0 Staff obtains , Records, Test Results / Process Orders []  - 0 Staff telephones HHA, Nursing Homes / Clarify orders / etc []  - 0 Routine Transfer to another Facility (non-emergent condition) []  - 0 Routine Hospital Admission (non-emergent condition) []  - 0 New Admissions / / Ordering NPWT, Apligraf, etc. []  - 0 Emergency Hospital Admission (emergent condition) X- 1 10 Simple Discharge Coordination []  - 0 Complex (extensive) Discharge Coordination PROCESS - Special Needs []  - Pediatric / Minor Patient Management 0 []  - 0 Isolation Patient Management []  - 0 Hearing / Language / Visual special needs []  - 0 Assessment of Community assistance (transportation, D/C planning,  etc.) []  - 0 Additional assistance / Altered mentation []  - 0 Support Surface(s) Assessment (bed, cushion, seat, etc.) INTERVENTIONS - Wound Cleansing / Measurement [name], Tony R. ( ) X- 1 5  Simple Wound Cleansing - one wound []  - 0 Complex Wound Cleansing - multiple wounds X- 1 5 Wound Imaging (photographs - any number of wounds) []  - 0 Wound Tracing (instead of photographs) X- 1 5 Simple Wound Measurement - one wound []  - 0 Complex Wound Measurement - multiple wounds INTERVENTIONS - Wound Dressings []  - Small Wound Dressing one or multiple wounds 0 []  - 0 Medium Wound Dressing one or multiple wounds []  - 0 Large Wound Dressing one or multiple wounds []  - 0 Application of Medications - topical []  - 0 Application of Medications - injection INTERVENTIONS - Miscellaneous []  - External ear exam 0 []  - 0 Specimen Collection (cultures, biopsies, blood, body fluids, etc.) []  - 0 Specimen(s) / Culture(s) sent or taken to Lab for analysis []  - 0 Patient Transfer (multiple staff / / Similar devices) []  - 0 Simple Staple / Suture removal (25 or less) []  - 0 Complex Staple / Suture removal (26 or more) []  - 0 Hypo / Hyperglycemic Management (close monitor of Blood Glucose) []  - 0 Ankle / Brachial Index (ABI) - do not check if billed separately X- 1 5 Vital Signs Has the patient been seen at the hospital within the last three years: Yes Total Score: 65 Level Of Care: New/Established - Level 2 Electronic Signature(s) Signed: 07/29/2021 5:08:20 PM By: RN Entered By: on 07/28/2021 11:56:40 [name], ( ) -------------------------------------------------------------------------------- Encounter Discharge Information Details Patient Name: Date of Service: 07/28/2021 11:00 AM Medical Record Number: Patient Account Number: Date of Birth/Sex: 1941-04-12 (80 y.o. M) Treating RN:  Primary Care Yarithza Mink: Other Clinician: Referring Tamecka Milham: Treating Ngan Qualls/Extender: in Treatment: 5 Encounter Discharge Information Items Discharge Condition: Stable Ambulatory Status: Ambulatory Discharge Destination: Home Transportation: Private Auto Accompanied By: self Schedule Follow-up Appointment: Yes Clinical Summary of Care: Patient Declined Electronic Signature(s) Signed: 07/28/2021 11:57:46 AM By: Yevonne Pax RN Entered By: Yevonne Pax on 07/28/2021 11:57:46 Wurzel, [name] ([id number]) -------------------------------------------------------------------------------- Lower Extremity Assessment Details Patient Name: [name]. Date of Service: 07/28/2021 11:00 AM Medical Record Number: [id number] Patient Account Number: [id number] Date of Birth/Sex: 02-03-1941 (80 y.o. M) Treating RN: Yevonne Pax Primary Care Teona Vargus: Beverely Risen Other Clinician: Referring Marissia Blackham: Beverely Risen Treating Dylin Ihnen/Extender: Tilda Franco in Treatment: 5 Edema Assessment Assessed: [Left: No] [Right: No] Edema: [Left: N] [Right: o] Calf Left: Right: Point of Measurement: 37 cm From Medial Instep 35 cm Ankle Left: Right: Point of Measurement: 10 cm From Medial Instep 25 cm Vascular Assessment Pulses: Dorsalis Pedis Palpable: [Right:Yes] Electronic Signature(s) Signed: 07/29/2021 5:08:20 PM By: Yevonne Pax RN Entered By: Yevonne Pax on 07/28/2021 11:08:44 [name], [name] ([id number]) -------------------------------------------------------------------------------- Multi Wound Chart Details Patient Name: [name]. Date of Service: 07/28/2021 11:00 AM Medical Record Number: [id number] Patient Account Number: [id number] Date of Birth/Sex: [birth year] (80 y.o. M) Treating RN: Yevonne Pax Primary Care Nolita Kutter: Beverely Risen Other Clinician: Referring Niyonna Betsill: Beverely Risen Treating  Seamus Warehime/Extender: Tilda Franco in Treatment: 5 Vital Signs Height(in): 71 Pulse(bpm): 88 Weight(lbs): 245 Blood Pressure(mmHg): 153/77 Body Mass Index(BMI): 34 Temperature(F): 98.7 Respiratory Rate(breaths/min): 18 Photos: [N/A:N/A] Wound Location: Right, Distal Lower Leg N/A N/A Wounding Event: Trauma N/A N/A Primary Etiology: Diabetic Wound/Ulcer of the Lower N/A N/A Extremity Comorbid History: Hypertension, Type II Diabetes N/A N/A Date Acquired: 06/21/2021 N/A N/A Weeks of Treatment: 5 N/A N/A Wound Status: Open N/A N/A Measurements L x W  x D (cm) 0x0x0 N/A N/A Area (cm) : 0 N/A N/A Volume (cm) : 0 N/A N/A % Reduction in Area: 100.00% N/A N/A % Reduction in Volume: 100.00% N/A N/A Classification: Grade 2 N/A N/A Exudate Amount: None Present N/A N/A Granulation Amount: None Present (0%) N/A N/A Necrotic Amount: None Present (0%) N/A N/A Exposed Structures: Fascia: No N/A N/A Fat Layer (Subcutaneous Tissue): No Tendon: No Muscle: No Joint: No Bone: No Epithelialization: Large (67-100%) N/A N/A Treatment Notes Electronic Signature(s) Signed: 07/28/2021 11:19:52 AM By: Geralyn Corwin DO Entered By: Geralyn Corwin on 07/28/2021 11:16:11 [name], [name] ([id number]) -------------------------------------------------------------------------------- Multi-Disciplinary Care Plan Details Patient Name: [name] Date of Service: 07/28/2021 11:00 AM Medical Record Number: [id number] Patient Account Number: [id number] Date of Birth/Sex: 1941-08-04 (80 y.o. M) Treating RN: Yevonne Pax Primary Care Tyshauna Finkbiner: Beverely Risen Other Clinician: Referring Emerie Vanderkolk: Beverely Risen Treating Lyndsi Altic/Extender: Tilda Franco in Treatment: 5 Active Inactive Electronic Signature(s) Signed: 07/29/2021 5:08:20 PM By: Yevonne Pax RN Entered By: Yevonne Pax on 07/28/2021 11:15:25 [name], [name]  ([id number]) -------------------------------------------------------------------------------- Pain Assessment Details Patient Name: [name] Date of Service: 07/28/2021 11:00 AM Medical Record Number: [id number] Patient Account Number: [id number] Date of Birth/Sex: 20-Sep-1941 (80 y.o. M) Treating RN: Yevonne Pax Primary Care Tyrese Capriotti: Beverely Risen Other Clinician: Referring Suezette Lafave: Beverely Risen Treating Peighton Edgin/Extender: Tilda Franco in Treatment: 5 Active Problems Location of Pain Severity and Description of Pain Patient Has Paino No Site Locations Pain Management and Medication Current Pain Management: Electronic Signature(s) Signed: 07/29/2021 5:08:20 PM By: Yevonne Pax RN Entered By: Yevonne Pax on 07/28/2021 11:07:42 [name], [name] ([id number]) -------------------------------------------------------------------------------- Patient/Caregiver Education Details Patient Name: [name]. Date of Service: 07/28/2021 11:00 AM Medical Record Number: [id number] Patient Account Number: [id number] Date of Birth/Gender: September 10, 1941 (80 y.o. M) Treating RN: Yevonne Pax Primary Care Physician: Beverely Risen Other Clinician: Referring Physician: Beverely Risen Treating Physician/Extender: Tilda Franco in Treatment: 5 Education Assessment Education Provided To: Patient Education Topics Provided Wound/Skin Impairment: Methods: Explain/Verbal Responses: State content correctly Electronic Signature(s) Signed: 07/29/2021 5:08:20 PM By: Yevonne Pax RN Entered By: Yevonne Pax on 07/28/2021 11:57:07 [name], [name] ([id number]) -------------------------------------------------------------------------------- Wound Assessment Details Patient Name: [name] Date of Service: 07/28/2021 11:00 AM Medical Record Number: [id number] Patient Account Number: [id number] Date of Birth/Sex: 04/16/1941 (80 y.o. M) Treating RN: Yevonne Pax Primary Care  Leea Rambeau: Beverely Risen Other Clinician: Referring Shelvie Salsberry: Beverely Risen Treating Shiron Whetsel/Extender: Tilda Franco in Treatment: 5 Wound Status Wound Number: 2 Primary Etiology: Diabetic Wound/Ulcer of the Lower Extremity Wound Location: Right, Distal Lower Leg Wound Status: Open Wounding Event: Trauma Comorbid History: Hypertension, Type II Diabetes Date Acquired: 06/21/2021 Weeks Of Treatment: 5 Clustered Wound: No Photos Wound Measurements Length: (cm) Width: (cm) Depth: (cm) Area: (cm) Volume: (cm) 0 % Reduction in Area: 100% 0 % Reduction in Volume: 100% 0 Epithelialization: Large (67-100%) 0 Tunneling: No 0 Undermining: No Wound Description Classification: Grade 2 Exudate Amount: None Present Foul Odor After Cleansing: No Slough/Fibrino No Wound Bed Granulation Amount: None Present (0%) Exposed Structure Necrotic Amount: None Present (0%) Fascia Exposed: No Fat Layer (Subcutaneous Tissue) Exposed: No Tendon Exposed: No Muscle Exposed: No Joint Exposed: No Bone Exposed: No Electronic Signature(s) Signed: 07/29/2021 5:08:20 PM By: Yevonne Pax RN Entered By: Yevonne Pax on 07/28/2021 11:08:12 [name], [name] ([id number]) -------------------------------------------------------------------------------- Vitals Details Patient Name: [name] Date of Service: 07/28/2021 11:00 AM Medical Record Number: [id number] Patient Account Number: [id number] Date of Birth/Sex: 09-02-1941 (79 y.o.  M) Treating RN: Yevonne Pax Primary Care Jeferson Boozer: Beverely Risen Other Clinician: Referring Corazon Nickolas: Beverely Risen Treating Zeev Deakins/Extender: Tilda Franco in Treatment: 5 Vital Signs Time Taken: 11:07 Temperature (F): 98.7 Height (in): 71 Pulse (bpm): 88 Weight (lbs): 245 Respiratory Rate (breaths/min): 18 Body Mass Index (BMI): 34.2 Blood Pressure (mmHg): 153/77 Reference Range: 80 - 120 mg / dl Electronic Signature(s) Signed: 07/29/2021 5:08:20  PM By: Yevonne Pax RN Entered By: Yevonne Pax on 07/28/2021 11:07:34

## 2021-07-29 NOTE — Progress Notes (Signed)
[name], [name] ([id number]) Visit Report for 07/28/2021 Chief Complaint Document Details Patient Name: [name], [name]. Date of Service: 07/28/2021 11:00 AM Medical Record Number: [id number] Patient Account Number: [id number] Date of Birth/Sex: Oct 02, 1941 (80 y.o. M) Treating RN: Yevonne Pax Primary Care Provider: Beverely Risen Other Clinician: Referring Provider: Beverely Risen Treating Provider/Extender: Tilda Franco in Treatment: 5 Information Obtained from: Patient Chief Complaint Right lower extremity trauma wound Electronic Signature(s) Signed: 07/28/2021 11:19:52 AM By: Geralyn Corwin DO Entered By: Geralyn Corwin on 07/28/2021 11:16:23 [name], [name] ([id number]) -------------------------------------------------------------------------------- HPI Details Patient Name: [name] Date of Service: 07/28/2021 11:00 AM Medical Record Number: [id number] Patient Account Number: [id number] Date of Birth/Sex: 21-Dec-1940 (80 y.o. M) Treating RN: Yevonne Pax Primary Care Provider: Beverely Risen Other Clinician: Referring Provider: Beverely Risen Treating Provider/Extender: Tilda Franco in Treatment: 5 History of Present Illness HPI Description: Admission 8/24 Rannie Craney is an 80 year old male with a past medical history of insulin-dependent type 2 diabetes, essential hypertension, and total knee arthroplasty that presents to the clinic for A 2-day history of lower extremity wound that has developed redness to the surrounding wound bed. He states that the redness started in the past day. He states he was evaluated today by his primary care provider for a wellness visit and the wound was evaluated and he was started on Bactrim. He states he has taken a dose already. He denies drainage or pain. He currently denies systemic signs of infection. 8/31; patient presents for 1 week follow-up. He reports completing Bactrim and has noticed great improvement in his leg  redness and warmth. He reports that this has resolved. He denies pain or drainage. He has been using Hydrofera Blue every other day to the wound bed. 9/7; patient presents for 1 week follow-up. He has no issues or complaints today. He continues to use Hydrofera Blue to the wound beds. He denies signs of infection. 9/14; patient presents for 1 week follow-up. He has no issues or complaints today. He continues to use Hydrofera Blue to the wound beds. He denies signs of infection. He continues to stay active. 9/28; patient presents for follow-up. He has no issues or complaints today. He has been using Hydrofera Blue on the wound bed. He mowed his 10 acre field yesterday without any issues. Electronic Signature(s) Signed: 07/28/2021 11:19:52 AM By: Geralyn Corwin DO Entered By: Geralyn Corwin on 07/28/2021 11:17:27 [name], [name] ([id number]) -------------------------------------------------------------------------------- Physical Exam Details Patient Name: [name], [name]. Date of Service: 07/28/2021 11:00 AM Medical Record Number: [id number] Patient Account Number: [id number] Date of Birth/Sex: [birth year] (80 y.o. M) Treating RN: Yevonne Pax Primary Care Provider: Beverely Risen Other Clinician: Referring Provider: Beverely Risen Treating Provider/Extender: Tilda Franco in Treatment: 5 Constitutional . Psychiatric . Notes Right lower extremity: Epithelialization to previous wound sites. Surrounding skin is intact. Electronic Signature(s) Signed: 07/28/2021 11:19:52 AM By: Geralyn Corwin DO Entered By: Geralyn Corwin on 07/28/2021 11:18:24 [name], [name] ([id number]) -------------------------------------------------------------------------------- Physician Orders Details Patient Name: [name]. Date of Service: 07/28/2021 11:00 AM Medical Record Number: [id number] Patient Account Number: [id number] Date of Birth/Sex: 1941/10/28 (80 y.o. M) Treating RN: Yevonne Pax Primary Care Provider: Beverely Risen Other Clinician: Referring Provider: Beverely Risen Treating Provider/Extender: Tilda Franco in Treatment: 5 Verbal / Phone Orders: No Diagnosis Coding ICD-10 Coding Code Description S81.801D Unspecified open wound, right lower leg, subsequent encounter E11.9 Type 2 diabetes mellitus without complications I10 Essential (primary) hypertension N18.30 Chronic kidney disease, stage 3 unspecified Z96.659  Presence of unspecified artificial knee joint Discharge From [facility] o Discharge from [facility] Care Center Treatment Complete - apply lotion daily and protective covering for 1 week Electronic Signature(s) Signed: 07/28/2021 11:56:01 AM By: Yevonne Pax RN Signed: 07/28/2021 12:31:15 PM By: Geralyn Corwin DO Entered By: Yevonne Pax on 07/28/2021 11:56:01 [name], [name] ([id number]) -------------------------------------------------------------------------------- Problem List Details Patient Name: [name], [name]. Date of Service: 07/28/2021 11:00 AM Medical Record Number: [id number] Patient Account Number: [id number] Date of Birth/Sex: [birth year] (80 y.o. M) Treating RN: Yevonne Pax Primary Care Provider: Beverely Risen Other Clinician: Referring Provider: Beverely Risen Treating Provider/Extender: Tilda Franco in Treatment: 5 Active Problems ICD-10 Encounter Code Description Active Date MDM Diagnosis S81.801D Unspecified open wound, right lower leg, subsequent encounter 06/30/2021 No Yes E11.9 Type 2 diabetes mellitus without complications 06/23/2021 No Yes I10 Essential (primary) hypertension 06/23/2021 No Yes N18.30 Chronic kidney disease, stage 3 unspecified 06/23/2021 No Yes Z96.659 Presence of unspecified artificial knee joint 06/23/2021 No Yes Inactive Problems ICD-10 Code Description Active Date Inactive Date S81.801A Unspecified open wound, right lower leg, initial encounter 06/23/2021 06/23/2021 Resolved  Problems Electronic Signature(s) Signed: 07/28/2021 11:19:52 AM By: Geralyn Corwin DO Entered By: Geralyn Corwin on 07/28/2021 11:16:05 [name], [name] ([id number]) -------------------------------------------------------------------------------- Progress Note Details Patient Name: [name]. Date of Service: 07/28/2021 11:00 AM Medical Record Number: [id number] Patient Account Number: [id number] Date of Birth/Sex: 1941/02/27 (79 y.o. M) Treating RN: Yevonne Pax Primary Care Provider: Beverely Risen Other Clinician: Referring Provider: Beverely Risen Treating Provider/Extender: Tilda Franco in Treatment: 5 Subjective Chief Complaint Information obtained from Patient Right lower extremity trauma wound History of Present Illness (HPI) Admission 8/24 [name] is an 80 year old male with a past medical history of insulin-dependent type 2 diabetes, essential hypertension, and total knee arthroplasty that presents to the clinic for A 2-day history of lower extremity wound that has developed redness to the surrounding wound bed. He states that the redness started in the past day. He states he was evaluated today by his primary care provider for a wellness visit and the wound was evaluated and he was started on Bactrim. He states he has taken a dose already. He denies drainage or pain. He currently denies systemic signs of infection. 8/31; patient presents for 1 week follow-up. He reports completing Bactrim and has noticed great improvement in his leg redness and warmth. He reports that this has resolved. He denies pain or drainage. He has been using Hydrofera Blue every other day to the wound bed. 9/7; patient presents for 1 week follow-up. He has no issues or complaints today. He continues to use Hydrofera Blue to the wound beds. He denies signs of infection. 9/14; patient presents for 1 week follow-up. He has no issues or complaints today. He continues to use Hydrofera  Blue to the wound beds. He denies signs of infection. He continues to stay active. 9/28; patient presents for follow-up. He has no issues or complaints today. He has been using Hydrofera Blue on the wound bed. He mowed his 10 acre field yesterday without any issues. Patient History Information obtained from Patient. Social History Former smoker, Marital Status - Married, Alcohol Use - Rarely, Drug Use - No History, Caffeine Use - Daily. Medical History Cardiovascular Patient has history of Hypertension Endocrine Patient has history of Type II Diabetes Objective Constitutional Vitals Time Taken: 11:07 AM, Height: 71 in, Weight: 245 lbs, BMI: 34.2, Temperature: 98.7 F, Pulse: 88 bpm, Respiratory Rate: 18 breaths/min, Blood Pressure: 153/77 mmHg. General  Notes: Right lower extremity: Epithelialization to previous wound sites. Surrounding skin is intact. Integumentary (Hair, Skin) Wound #2 status is Open. Original cause of wound was Trauma. The date acquired was: 06/21/2021. The wound has been in treatment 5 weeks. The wound is located on the Right,Distal Lower Leg. The wound measures 0cm length x 0cm width x 0cm depth; 0cm^2 area and 0cm^3 volume. There is no tunneling or undermining noted. There is a none present amount of drainage noted. There is no granulation within the wound bed. There is no necrotic tissue within the wound bed. [name], [name] ([id number]) Assessment Active Problems ICD-10 Unspecified open wound, right lower leg, subsequent encounter Type 2 diabetes mellitus without complications Essential (primary) hypertension Chronic kidney disease, stage 3 unspecified Presence of unspecified artificial knee joint Patient has done well with Hydrofera Blue. There is epithelialization over previous wound site. I recommended keeping the area protected for the next week. He can follow-up as needed. He knows to call with any questions or concerns. Plan 1. Discharge from clinic  due to closed wounds 2. Follow-up as needed Electronic Signature(s) Signed: 07/28/2021 11:19:52 AM By: Geralyn Corwin DO Entered By: Geralyn Corwin on 07/28/2021 11:19:15 [name], [name] ([id number]) -------------------------------------------------------------------------------- ROS/PFSH Details Patient Name: [name]. Date of Service: 07/28/2021 11:00 AM Medical Record Number: [id number] Patient Account Number: [id number] Date of Birth/Sex: 05/08/1941 (80 y.o. M) Treating RN: Yevonne Pax Primary Care Provider: Beverely Risen Other Clinician: Referring Provider: Beverely Risen Treating Provider/Extender: Tilda Franco in Treatment: 5 Information Obtained From Patient Cardiovascular Medical History: Positive for: Hypertension Endocrine Medical History: Positive for: Type II Diabetes Time with diabetes: 47 Treated with: Insulin, Oral agents Blood sugar tested every day: No Immunizations Pneumococcal Vaccine: Received Pneumococcal Vaccination: No Implantable Devices None Family and Social History Former smoker; Marital Status - Married; Alcohol Use: Rarely; Drug Use: No History; Caffeine Use: Daily; Financial Concerns: No; Food, Clothing or Shelter Needs: No; Support System Lacking: No; Transportation Concerns: No Electronic Signature(s) Signed: 07/28/2021 11:19:52 AM By: Geralyn Corwin DO Signed: 07/29/2021 5:08:20 PM By: Yevonne Pax RN Entered By: Geralyn Corwin on 07/28/2021 11:17:35 [name], [name] ([id number]) -------------------------------------------------------------------------------- SuperBill Details Patient Name: [name]. Date of Service: 07/28/2021 Medical Record Number: [id number] Patient Account Number: [id number] Date of Birth/Sex: 1941-03-22 (81 y.o. M) Treating RN: Yevonne Pax Primary Care Provider: Beverely Risen Other Clinician: Referring Provider: Beverely Risen Treating Provider/Extender: Tilda Franco in Treatment:  5 Diagnosis Coding ICD-10 Codes Code Description S81.801D Unspecified open wound, right lower leg, subsequent encounter E11.9 Type 2 diabetes mellitus without complications I10 Essential (primary) hypertension N18.30 Chronic kidney disease, stage 3 unspecified Z96.659 Presence of unspecified artificial knee joint Facility Procedures CPT4 Code: 11552080 Description: [phone] - WOUND CARE VISIT-LEV 2 EST PT Modifier: Quantity: 1 Physician Procedures CPT4 Code: 1224497 Description: 99213 - WC PHYS LEVEL 3 - EST PT Modifier: Quantity: 1 CPT4 Code: Description: ICD-10 Diagnosis Description S81.801D Unspecified open wound, right lower leg, subsequent encounter E11.9 Type 2 diabetes mellitus without complications I10 Essential (primary) hypertension Z96.659 Presence of unspecified artificial knee  joint Modifier: Quantity: Electronic Signature(s) Signed: 07/28/2021 11:56:55 AM By: Yevonne Pax RN Signed: 07/28/2021 12:31:15 PM By: Geralyn Corwin DO Previous Signature: 07/28/2021 11:19:52 AM Version By: Geralyn Corwin DO Entered By: Yevonne Pax on 07/28/2021 11:56:55

## 2021-08-10 ENCOUNTER — Ambulatory Visit
Admission: RE | Admit: 2021-08-10 | Discharge: 2021-08-10 | Disposition: A | Discharge status: Home / Self Care | Payer: Medicare HMO | Attending: Nurse Practitioner | Admitting: Nurse Practitioner

## 2021-08-10 ENCOUNTER — Encounter: Payer: Self-pay | Admitting: Nurse Practitioner

## 2021-08-10 ENCOUNTER — Ambulatory Visit
Admission: RE | Admit: 2021-08-10 | Discharge: 2021-08-10 | Disposition: A | Discharge status: Home / Self Care | Payer: Medicare HMO | Source: Ambulatory Visit | Attending: Nurse Practitioner | Admitting: Nurse Practitioner

## 2021-08-10 ENCOUNTER — Other Ambulatory Visit: Payer: Self-pay

## 2021-08-10 ENCOUNTER — Ambulatory Visit (INDEPENDENT_AMBULATORY_CARE_PROVIDER_SITE_OTHER): Payer: Medicare HMO | Admitting: Nurse Practitioner

## 2021-08-10 ENCOUNTER — Telehealth: Payer: Self-pay

## 2021-08-10 VITALS — BP 155/75 | HR 104 | Temp 98.6°F | Resp 16 | Ht 71.0 in | Wt 229.0 lb

## 2021-08-10 DIAGNOSIS — L03115 Cellulitis of right lower limb: Secondary | ICD-10-CM | POA: Diagnosis not present

## 2021-08-10 DIAGNOSIS — J189 Pneumonia, unspecified organism: Secondary | ICD-10-CM

## 2021-08-10 DIAGNOSIS — Z794 Long term (current) use of insulin: Secondary | ICD-10-CM

## 2021-08-10 DIAGNOSIS — R918 Other nonspecific abnormal finding of lung field: Secondary | ICD-10-CM | POA: Diagnosis not present

## 2021-08-10 DIAGNOSIS — I10 Essential (primary) hypertension: Secondary | ICD-10-CM

## 2021-08-10 DIAGNOSIS — R0602 Shortness of breath: Secondary | ICD-10-CM

## 2021-08-10 DIAGNOSIS — R000 Tachycardia, unspecified: Secondary | ICD-10-CM

## 2021-08-10 DIAGNOSIS — J441 Chronic obstructive pulmonary disease with (acute) exacerbation: Secondary | ICD-10-CM

## 2021-08-10 DIAGNOSIS — I6523 Occlusion and stenosis of bilateral carotid arteries: Secondary | ICD-10-CM | POA: Diagnosis not present

## 2021-08-10 DIAGNOSIS — E1165 Type 2 diabetes mellitus with hyperglycemia: Secondary | ICD-10-CM

## 2021-08-10 DIAGNOSIS — J449 Chronic obstructive pulmonary disease, unspecified: Secondary | ICD-10-CM | POA: Diagnosis not present

## 2021-08-10 DIAGNOSIS — Z125 Encounter for screening for malignant neoplasm of prostate: Secondary | ICD-10-CM | POA: Diagnosis not present

## 2021-08-10 MED ORDER — PREDNISONE 10 MG PO TABS: ORAL_TABLET | ORAL | 0 refills | Status: DC

## 2021-08-10 MED ORDER — AZITHROMYCIN 250 MG PO TABS: 500.0000 mg | ORAL_TABLET | Freq: Every day | ORAL | 0 refills | Status: AC

## 2021-08-10 MED ORDER — AZITHROMYCIN 250 MG PO TABS: ORAL_TABLET | ORAL | 0 refills | Status: DC

## 2021-08-10 MED ORDER — AZITHROMYCIN 250 MG PO TABS
500.0000 mg | ORAL_TABLET | Freq: Every day | ORAL | 0 refills | Status: DC
Start: 2021-08-10 — End: 2021-08-10

## 2021-08-10 NOTE — Telephone Encounter (Signed)
Pt advised as per alyssa patients xray today showed pneumonia in the lower lobs bilaterally. I had already sent azithromycin 250 for 10 days but I want him to double up and take 500 mg daily for 5 days which should be 2 tablets daily for 5 days instead of 1 daily for 10 days. please tell him to write it on the bottle so he does not go by the old and also

## 2021-08-10 NOTE — Progress Notes (Signed)
[facility] [address] [place], [place] [address]  Internal MEDICINE  Office Visit Note  Patient Name: [name]  [id number]  [id number]  Date of Service: 08/14/2021  Chief Complaint  Patient presents with   Acute Visit     HPI Briston presents for an acute sick visit for shortness of breath. He has not been exposed to covid-19. He denies any fever, chills, cough, sinus drainage, sinus pressure, fatigue or body aches. His o2 saturation is 93% at rest on room air. Six minute walk was not performed due to patient's unsteady gait. His heart rate has been persistently tachycardic during his office visit, HR 109 now, EKG done in office- was sinus tachycardia wnl. Patient has emphysema and COPD, was a former 30 year smoker and smoked 3 ppd and quit in 1988. He previously had an echocardiogram in December 2021 for SOB.    Current Medication:  Outpatient Encounter Medications as of 08/10/2021  Medication Sig   amoxicillin (AMOXIL) 500 MG tablet Take 2,000 mg by mouth as needed (Prior to dental procedures).   aspirin EC 81 MG tablet Take 81 mg by mouth daily.   atorvastatin (LIPITOR) 40 MG tablet TAKE 1 TABLET BY MOUTH EVERY DAY   benazepril (LOTENSIN) 40 MG tablet TAKE 1 TABLET BY MOUTH EVERY DAY   Cholecalciferol 1000 units capsule Take 1,000 Units by mouth daily.    Cinnamon 500 MG TABS Take 1,000 mg by mouth daily.   Glucosamine-Chondroitin 500-400 MG CAPS Take 3 tablets by mouth daily.   HUMULIN R 100 UNIT/ML injection Inject 0.15-0.17 mLs (15-17 Units total) into the skin 2 (two) times daily before a meal. And per sliding scale Pt is not to get novolin at all (Patient taking differently: Inject 15-17 Units into the skin 2 (two) times daily before a meal. 16 units in the morning and per sliding scale Pt is not to get novolin at all)   hydrochlorothiazide (HYDRODIURIL) 25 MG tablet TAKE 1 TABLET BY MOUTH EVERY DAY IN THE MORNING   Insulin Glargine (LANTUS SOLOSTAR)  100 UNIT/ML Solostar Pen Inject 65 Units into the skin daily at 10 pm.   insulin glargine (LANTUS) 100 UNIT/ML injection Inject 0.56 mLs (56 Units total) into the skin daily with breakfast.   insulin regular (HUMULIN R) 100 units/mL injection USE 17 UNITS SUBCUTANEOUSLY TWICE DAILY WITH MEALS AND USE A SLIDING SCALE IF SUGARS GET HIGH   [place] Ginseng 1000 MG TABS Take 1,000 mg by mouth daily.   Omega 3 1200 MG CAPS Take 1,200 mg by mouth daily.   ONE TOUCH ULTRA TEST test strip CHECK SUGAR 5 X A DAY FOR DX 250.3 & FLUCTUATING SUGAR   pantoprazole (PROTONIX) 40 MG tablet TAKE 1 TABLET BY MOUTH EVERY DAY   predniSONE (DELTASONE) 10 MG tablet Take one tab 3 x day for 3 days, then take one tab 2 x a day for 3 days and then take one tab a day for 3 days for copd   pyridOXINE (VITAMIN B-6) 100 MG tablet Take 100 mg by mouth daily.   thyroid (ARMOUR) 120 MG tablet Take 120 mg by mouth daily before breakfast.   zinc gluconate 50 MG tablet Take 50 mg by mouth daily.   [DISCONTINUED] azithromycin (ZITHROMAX) 250 MG tablet Take one tab a day for 10 days for uri   [DISCONTINUED] azithromycin (ZITHROMAX) 250 MG tablet Take 2 tablets (500 mg total) by mouth daily for 5 days.   No facility-administered encounter medications  on file as of 08/10/2021.      Medical History: Past Medical History:  Diagnosis Date   Agent orange exposure    [place] war exposure    Arthritis    "left knee" (03/18/2014)   Closed pelvic fracture (HCC) 03/18/2014   "multiple S/P fall w/loss of consciousness; CBG 29"   Encephalopathy acute 08/16/2013   GERD (gastroesophageal reflux disease)    High cholesterol    History of use of hearing aid in both ears    does not currently wear   Hypertension    Hypothyroidism    Type II diabetes mellitus (HCC)      Vital Signs: BP (!) 155/75 Comment: 158/80  Pulse (!) 104 Comment: 109  Temp 98.6 F (37 C)   Resp 16   Ht 5\' 11"  (1.803 m)   Wt 229 lb (103.9 kg)   SpO2 93%  Comment: 95  BMI 31.94 kg/m    Review of Systems  Constitutional: Negative.  Negative for chills, fatigue and fever.  HENT: Negative.  Negative for congestion, ear pain, postnasal drip, rhinorrhea, sinus pressure, sinus pain, sneezing and sore throat.   Respiratory:  Positive for shortness of breath. Negative for cough, chest tightness and wheezing.   Cardiovascular: Negative.  Negative for chest pain and palpitations.  Gastrointestinal: Negative.   Neurological:  Negative for dizziness, light-headedness and headaches.   Physical Exam Constitutional:      General: He is not in acute distress.    Appearance: Normal appearance. He is obese. He is not ill-appearing.  HENT:     Head: Normocephalic and atraumatic.  Eyes:     Extraocular Movements: Extraocular movements intact.     Pupils: Pupils are equal, round, and reactive to light.  Cardiovascular:     Rate and Rhythm: Regular rhythm. Tachycardia present.     Heart sounds: Normal heart sounds, S1 normal and S2 normal. No murmur heard. Pulmonary:     Effort: No accessory muscle usage or respiratory distress.     Breath sounds: Normal air entry. Wheezing present.     Comments: Increase work of breathing.  Musculoskeletal:     Right lower leg: 2+ Pitting Edema present.     Left lower leg: 2+ Pitting Edema present.  Neurological:     Mental Status: He is alert and oriented to person, place, and time.     Cranial Nerves: No cranial nerve deficit.     Coordination: Coordination normal.     Gait: Gait normal.  Psychiatric:        Mood and Affect: Mood normal.        Behavior: Behavior normal.      Assessment/Plan: 1. Essential hypertension Blood pressure elevated today. He currently takes benazepril and hydrochlorothiazide. Reports his numbers are better at home. Will reassess at next office visit in 1 month  2. Tachycardia Mild tachycardia with SOB, EKG done showing sinus tachycardia wnl.  - EKG 12-Lead  3. Shortness of  breath Chest xray ordered to rule out pneumonia or other acute respiratory process. Last echocardiogram was done in December 2021 and was stable.  - DG Chest 2 View; Future  4. Type 2 diabetes mellitus with hyperglycemia, with long-term current use of insulin (HCC) A1C was 6.8 in august, patient made aware that prednisone will elevate his glucose levels since he will be taking a short course of prednisone for COPD exacerbation.   5. Cellulitis of right lower extremity resolved  6. Bilateral carotid artery stenosis Carotid September ordered,  follow up in 1 month to discuss results.  - US Carotid Duplex Bilateral; Future  7. Chronic obstructive pulmonary disease with acute exacerbation (HCC) Chest xray, prednisone and azithromycin ordered. Follow up in 1 month.  - DG Chest 2 View; Future - predniSONE (DELTASONE) 10 MG tablet; Take one tab 3 x day for 3 days, then take one tab 2 x a day for 3 days and then take one tab a day for 3 days for copd  Dispense: 18 tablet; Refill: 0 - azithromycin (ZITHROMAX) 250 MG tablet; Take one tab a day for 10 days for uri  Dispense: 10 tablet; Refill: 0   General Counseling: Joaquim Nam understanding of the findings of todays visit and agrees with plan of treatment. I have discussed any further diagnostic evaluation that may be needed or ordered today. We also reviewed his medications today. he has been encouraged to call the office with any questions or concerns that should arise related to todays visit.    Counseling:    Orders Placed This Encounter  Procedures   US Carotid Duplex Bilateral   DG Chest 2 View   CBC with Differential/Platelet   Lipid Panel With LDL/HDL Ratio   TSH   T4, free   Comprehensive metabolic panel   PSA   EKG 12-Lead    Meds ordered this encounter  Medications   predniSONE (DELTASONE) 10 MG tablet    Sig: Take one tab 3 x day for 3 days, then take one tab 2 x a day for 3 days and then take one tab a day for 3 days  for copd    Dispense:  18 tablet    Refill:  0   DISCONTD: azithromycin (ZITHROMAX) 250 MG tablet    Sig: Take one tab a day for 10 days for uri    Dispense:  10 tablet    Refill:  0   DISCONTD: azithromycin (ZITHROMAX) 250 MG tablet    Sig: Take 2 tablets (500 mg total) by mouth daily for 5 days.    Dispense:  10 tablet    Refill:  0    Return in about 1 month (around 09/10/2021) for F/U, U/S @ [facility].  [place] Controlled Substance Database was reviewed by me for overdose risk score (ORS)  Time spent:30 Minutes Time spent with patient included reviewing progress notes, labs, imaging studies, and discussing plan for follow up.   This patient was seen by Sallyanne Kuster, FNP-C in collaboration with Dr. Beverely Risen as a part of collaborative care agreement.  Dossie Ocanas R. Tedd Sias, MSN, FNP-C Internal Medicine

## 2021-08-13 ENCOUNTER — Telehealth: Payer: Self-pay

## 2021-08-13 ENCOUNTER — Telehealth: Payer: Self-pay | Admitting: Internal Medicine

## 2021-08-13 NOTE — Chronic Care Management (AMB) (Signed)
  Chronic Care Management   Outreach Note  08/13/2021 Name: [name] MRN: [id number] DOB: [birth year]  Referred by: Lyndon Code, MD Reason for referral : No chief complaint on file.   An unsuccessful telephone outreach was attempted today. The patient was referred to the pharmacist for assistance with care management and care coordination.   Follow Up Plan:   Tatjana Dellinger Upstream Scheduler

## 2021-08-13 NOTE — Telephone Encounter (Signed)
Pt walked in with concerns about his blood sugar, explained to him that prednisone can cause his blood sugar to go up per Alyssa and that Alyssa did explain that to him when he was in the office. Per Alyssa when he is breathing better then he can stop the prednisone. Pt stated he was not breathing better and I advised him to keep taking meds. Also clarified he needs to finish the antibiotic.

## 2021-08-17 ENCOUNTER — Telehealth: Payer: Self-pay

## 2021-08-17 NOTE — Telephone Encounter (Signed)
Called and spoke to pt and informed him to go have labwork done before his 09/09/21 appt.  Had pt write down on paper at home for him to go to [facility] beside [facility] to get labs done on 09/02/21 and then had him place paper on his refrigerator so he could remember.  Pt stated that is where he puts his appt reminders at.

## 2021-08-17 NOTE — Telephone Encounter (Signed)
error 

## 2021-08-18 ENCOUNTER — Other Ambulatory Visit: Payer: Self-pay

## 2021-08-18 ENCOUNTER — Ambulatory Visit (INDEPENDENT_AMBULATORY_CARE_PROVIDER_SITE_OTHER): Payer: Medicare HMO

## 2021-08-18 DIAGNOSIS — I6523 Occlusion and stenosis of bilateral carotid arteries: Secondary | ICD-10-CM | POA: Diagnosis not present

## 2021-08-22 NOTE — Procedures (Signed)
[facility] [address] [place], [place] [address]  DATE OF SERVICE: August 18, 2021  CAROTID DOPPLER INTERPRETATION:  Bilateral Carotid Ultrsasound and Color Doppler Examination was performed. The RIGHT CCA shows mild plaque in the vessel. The LEFT CCA shows mild plaque in the vessel. There was no significant intimal thickening noted in the RIGHT carotid artery. There was no significant intimal thickening in the LEFT carotid artery.  The RIGHT CCA shows peak systolic velocity of 74 cm per second. The end diastolic velocity is 11 cm per second on the RIGHT side. The RIGHT ICA shows peak systolic velocity of 82 per second. RIGHT sided ICA end diastolic velocity is 12 cm per second. The RIGHT ECA shows a peak systolic velocity of 97 cm per second. The ICA/CCA ratio is calculated to be 1.12. This suggests less than 50% stenosis. The Vertebral Artery shows antegrade flow.  The LEFT CCA shows peak systolic velocity of 91 cm per second. The end diastolic velocity is 9 cm per second on the LEFT side. The LEFT ICA shows peak systolic velocity of 77 per second. LEFT sided ICA end diastolic velocity is 18 cm per second. The LEFT ECA shows a peak systolic velocity of 107 cm per second. The ICA/CCA ratio is calculated to be 0.84. This suggests less than 50% stenosis. The Vertebral Artery shows antegrade flow.   Impression:    The RIGHT CAROTID shows less than 50% stenosis. The LEFT CAROTID shows less than 50% stenosis.  There is mild plaque formation noted on the LEFT and mild plaque on the RIGHT  side. Consider a repeat Carotid doppler if clinical situation and symptoms warrant in 6-12 months. Patient should be encouraged to change lifestyles such as smoking cessation, regular exercise and dietary modification. Use of statins in the right clinical setting and ASA is encouraged.  Yevonne Pax, MD [facility] Pulmonary Critical Care Medicine

## 2021-09-02 DIAGNOSIS — E1165 Type 2 diabetes mellitus with hyperglycemia: Secondary | ICD-10-CM | POA: Diagnosis not present

## 2021-09-02 DIAGNOSIS — R000 Tachycardia, unspecified: Secondary | ICD-10-CM | POA: Diagnosis not present

## 2021-09-02 DIAGNOSIS — Z794 Long term (current) use of insulin: Secondary | ICD-10-CM | POA: Diagnosis not present

## 2021-09-02 DIAGNOSIS — I10 Essential (primary) hypertension: Secondary | ICD-10-CM | POA: Diagnosis not present

## 2021-09-02 DIAGNOSIS — L03115 Cellulitis of right lower limb: Secondary | ICD-10-CM | POA: Diagnosis not present

## 2021-09-02 DIAGNOSIS — Z125 Encounter for screening for malignant neoplasm of prostate: Secondary | ICD-10-CM | POA: Diagnosis not present

## 2021-09-03 LAB — PSA: Prostate Specific Ag, Serum: 1.9 ng/mL (ref 0.0–4.0)

## 2021-09-03 LAB — COMPREHENSIVE METABOLIC PANEL
ALT: 24 IU/L (ref 0–44)
AST: 25 IU/L (ref 0–40)
Albumin/Globulin Ratio: 2.2 (ref 1.2–2.2)
Albumin: 4.2 g/dL (ref 3.6–4.6)
Alkaline Phosphatase: 85 IU/L (ref 44–121)
BUN/Creatinine Ratio: 12 (ref 10–24)
BUN: 11 mg/dL (ref 8–27)
Bilirubin Total: 0.4 mg/dL (ref 0.0–1.2)
CO2: 25 mmol/L (ref 20–29)
Calcium: 9.2 mg/dL (ref 8.6–10.2)
Chloride: 100 mmol/L (ref 96–106)
Creatinine, Ser: 0.95 mg/dL (ref 0.76–1.27)
Globulin, Total: 1.9 g/dL (ref 1.5–4.5)
Glucose: 148 mg/dL — ABNORMAL HIGH (ref 70–99)
Potassium: 4.2 mmol/L (ref 3.5–5.2)
Sodium: 142 mmol/L (ref 134–144)
Total Protein: 6.1 g/dL (ref 6.0–8.5)
eGFR: 80 mL/min/{1.73_m2} (ref >59)

## 2021-09-03 LAB — LIPID PANEL WITH LDL/HDL RATIO
Cholesterol, Total: 105 mg/dL (ref 100–199)
HDL: 47 mg/dL (ref >39)
LDL Chol Calc (NIH): 37 mg/dL (ref 0–99)
LDL/HDL Ratio: 0.8 ratio (ref 0.0–3.6)
Triglycerides: 118 mg/dL (ref 0–149)
VLDL Cholesterol Cal: 21 mg/dL (ref 5–40)

## 2021-09-03 LAB — CBC WITH DIFFERENTIAL/PLATELET
Basophils Absolute: 0.1 10*3/uL (ref 0.0–0.2)
Basos: 1 %
EOS (ABSOLUTE): 0.1 10*3/uL (ref 0.0–0.4)
Eos: 1 %
Hematocrit: 37.8 % (ref 37.5–51.0)
Hemoglobin: 12.8 g/dL — ABNORMAL LOW (ref 13.0–17.7)
Immature Grans (Abs): 0.1 10*3/uL (ref 0.0–0.1)
Immature Granulocytes: 2 %
Lymphocytes Absolute: 4.1 10*3/uL — ABNORMAL HIGH (ref 0.7–3.1)
Lymphs: 58 %
MCH: 34 pg — ABNORMAL HIGH (ref 26.6–33.0)
MCHC: 33.9 g/dL (ref 31.5–35.7)
MCV: 101 fL — ABNORMAL HIGH (ref 79–97)
Monocytes Absolute: 0.6 10*3/uL (ref 0.1–0.9)
Monocytes: 9 %
Neutrophils Absolute: 2.1 10*3/uL (ref 1.4–7.0)
Neutrophils: 29 %
Platelets: 125 10*3/uL — ABNORMAL LOW (ref 150–450)
RBC: 3.76 x10E6/uL — ABNORMAL LOW (ref 4.14–5.80)
RDW: 14.1 % (ref 11.6–15.4)
WBC: 7.1 10*3/uL (ref 3.4–10.8)

## 2021-09-03 LAB — T4, FREE: Free T4: 0.89 ng/dL (ref 0.82–1.77)

## 2021-09-03 LAB — TSH: TSH: 19.7 u[IU]/mL — ABNORMAL HIGH (ref 0.450–4.500)

## 2021-09-06 NOTE — Progress Notes (Signed)
Review.

## 2021-09-09 ENCOUNTER — Encounter: Payer: Self-pay | Admitting: Nurse Practitioner

## 2021-09-09 ENCOUNTER — Ambulatory Visit (INDEPENDENT_AMBULATORY_CARE_PROVIDER_SITE_OTHER): Payer: Medicare HMO | Admitting: Nurse Practitioner

## 2021-09-09 ENCOUNTER — Other Ambulatory Visit: Payer: Self-pay

## 2021-09-09 VITALS — BP 120/72 | HR 97 | Temp 98.5°F | Resp 16 | Ht 71.0 in | Wt 252.6 lb

## 2021-09-09 DIAGNOSIS — I10 Essential (primary) hypertension: Secondary | ICD-10-CM | POA: Diagnosis not present

## 2021-09-09 DIAGNOSIS — E039 Hypothyroidism, unspecified: Secondary | ICD-10-CM

## 2021-09-09 DIAGNOSIS — D693 Immune thrombocytopenic purpura: Secondary | ICD-10-CM

## 2021-09-09 DIAGNOSIS — D539 Nutritional anemia, unspecified: Secondary | ICD-10-CM

## 2021-09-09 DIAGNOSIS — Z23 Encounter for immunization: Secondary | ICD-10-CM

## 2021-09-09 MED ORDER — PNEUMOCOCCAL 20-VAL CONJ VACC 0.5 ML IM SUSY: 0.5000 mL | PREFILLED_SYRINGE | INTRAMUSCULAR | 0 refills | Status: AC

## 2021-09-09 MED ORDER — ZOSTER VAC RECOMB ADJUVANTED 50 MCG/0.5ML IM SUSR: 0.5000 mL | Freq: Once | INTRAMUSCULAR | 0 refills | Status: AC

## 2021-09-09 NOTE — Progress Notes (Signed)
[facility] [address] [place], [place] [address]  Internal MEDICINE  Office Visit Note  Patient Name: [name]  [id number]  [id number]  Date of Service: 09/09/2021  Chief Complaint  Patient presents with   Follow-up    Review U/S   Diabetes   Gastroesophageal Reflux   Hyperlipidemia   Hypertension    HPI Yitzchok presents for a follow-up visit for hypertension and to discuss results of his ultrasound of the carotid arteries.  Xaviar has aortic atherosclerosis which is a risk factor for carotid stenosis and stroke.  Bilateral carotid ultrasound showed no significant stenosis of bilateral carotid arteries and no significant plaque formation.  His blood pressure is well controlled today. At his previous visit, the patient had a respiratory infection and a chest x-ray was done and it was found to be pneumonia.  The patient was treated with appropriate antibiotics and prednisone taper.  He reports feeling much better since that time he had labs ordered previously His PSA was normal Free T4 was normal.  His TSH was significantly elevated 19.  He sees Dr. Nelia Shi for hypothyroidism.  His lipid panel was normal.  His metabolic panel was grossly normal.  His CBC was significantly abnormal showing a macrocytic hyperchromic anemia and thrombocytopenia which is chronic   Current Medication: Outpatient Encounter Medications as of 09/09/2021  Medication Sig   aspirin EC 81 MG tablet Take 81 mg by mouth daily.   atorvastatin (LIPITOR) 40 MG tablet TAKE 1 TABLET BY MOUTH EVERY DAY   Cholecalciferol 1000 units capsule Take 1,000 Units by mouth daily.    Cinnamon 500 MG TABS Take 1,000 mg by mouth daily.   Glucosamine-Chondroitin 500-400 MG CAPS Take 3 tablets by mouth daily.   HUMULIN R 100 UNIT/ML injection Inject 0.15-0.17 mLs (15-17 Units total) into the skin 2 (two) times daily before a meal. And per sliding scale Pt is not to get novolin at all (Patient taking  differently: Inject 15-17 Units into the skin 2 (two) times daily before a meal. 16 units in the morning and per sliding scale Pt is not to get novolin at all)   hydrochlorothiazide (HYDRODIURIL) 25 MG tablet TAKE 1 TABLET BY MOUTH EVERY DAY IN THE MORNING   Insulin Glargine (LANTUS SOLOSTAR) 100 UNIT/ML Solostar Pen Inject 65 Units into the skin daily at 10 pm.   insulin glargine (LANTUS) 100 UNIT/ML injection Inject 0.56 mLs (56 Units total) into the skin daily with breakfast.   insulin regular (HUMULIN R) 100 units/mL injection USE 17 UNITS SUBCUTANEOUSLY TWICE DAILY WITH MEALS AND USE A SLIDING SCALE IF SUGARS GET HIGH   [place] Ginseng 1000 MG TABS Take 1,000 mg by mouth daily.   Omega 3 1200 MG CAPS Take 1,200 mg by mouth daily.   ONE TOUCH ULTRA TEST test strip CHECK SUGAR 5 X A DAY FOR DX 250.3 & FLUCTUATING SUGAR   predniSONE (DELTASONE) 10 MG tablet Take one tab 3 x day for 3 days, then take one tab 2 x a day for 3 days and then take one tab a day for 3 days for copd   pyridOXINE (VITAMIN B-6) 100 MG tablet Take 100 mg by mouth daily.   thyroid (ARMOUR) 120 MG tablet Take 120 mg by mouth daily before breakfast.   zinc gluconate 50 MG tablet Take 50 mg by mouth daily.   [DISCONTINUED] benazepril (LOTENSIN) 40 MG tablet TAKE 1 TABLET BY MOUTH EVERY DAY   [DISCONTINUED] pantoprazole (PROTONIX) 40 MG  tablet TAKE 1 TABLET BY MOUTH EVERY DAY   [DISCONTINUED] pneumococcal 20-valent conjugate vaccine (PREVNAR 20) 0.5 ML injection Inject 0.5 mLs into the muscle tomorrow at 10 am.   [DISCONTINUED] Zoster Vaccine Adjuvanted [facility]) injection Inject 0.5 mLs into the muscle once.   [EXPIRED] pneumococcal 20-valent conjugate vaccine (PREVNAR 20) 0.5 ML injection Inject 0.5 mLs into the muscle tomorrow at 10 am for 1 dose.   [EXPIRED] Zoster Vaccine Adjuvanted [facility]) injection Inject 0.5 mLs into the muscle once for 1 dose.   [DISCONTINUED] amoxicillin (AMOXIL) 500 MG tablet Take 2,000 mg by  mouth as needed (Prior to dental procedures). (Patient not taking: Reported on 09/09/2021)   No facility-administered encounter medications on file as of 09/09/2021.    Surgical History: Past Surgical History:  Procedure Laterality Date   CARPAL TUNNEL RELEASE Right 1980's   CARPAL TUNNEL RELEASE Left 05/05/2016   Procedure: CARPAL TUNNEL RELEASE;  Surgeon: Kennedy Bucker, MD;  Location: [facility] OR[facility];  Service: Orthopedics;  Laterality: Left;   CATARACT EXTRACTION W/PHACO Left 05/12/2015   Procedure: CATARACT EXTRACTION PHACO AND INTRAOCULAR LENS PLACEMENT (IOC);  Surgeon: Galen Manila, MD;  Location: [facility] OR[facility];  Service: Ophthalmology;  Laterality: Left;  [place] 01:53.1AP% 21.4CDE 24.4fluid pack lot # [id number]   CATARACT EXTRACTION W/PHACO Right 07/02/2019   Procedure: CATARACT EXTRACTION PHACO AND INTRAOCULAR LENS PLACEMENT (IOC) RIGHT DIABETIC  01:53.2  18.9%  21.43;  Surgeon: Galen Manila, MD;  Location: [facility];  Service: Ophthalmology;  Laterality: Right;  Diabetic - insulin   EYE SURGERY Left    cataract extractions   INCISION AND DRAINAGE OF WOUND Left 1988   "staph infection" left leg   INTRAMEDULLARY (IM) NAIL INTERTROCHANTERIC Right 01/22/2018   Procedure: INTRAMEDULLARY (IM) NAIL INTERTROCHANTRIC;  Surgeon: Juanell Fairly, MD;  Location: [facility];  Service: Orthopedics;  Laterality: Right;   KNEE ARTHROPLASTY Left 12/28/2015   Procedure: COMPUTER ASSISTED TOTAL KNEE ARTHROPLASTY;  Surgeon: Donato Heinz, MD;  Location: [facility];  Service: Orthopedics;  Laterality: Left;   ORIF WRIST FRACTURE Left 05/05/2016   Procedure: OPEN REDUCTION INTERNAL FIXATION (ORIF) WRIST FRACTURE;  Surgeon: Kennedy Bucker, MD;  Location: [facility];  Service: Orthopedics;  Laterality: Left;    Medical History: Past Medical History:  Diagnosis Date   Agent orange exposure    [place] war exposure    Arthritis    "left knee" (03/18/2014)   Closed pelvic fracture (HCC) 03/18/2014   "multiple S/P  fall w/loss of consciousness; CBG 29"   Encephalopathy acute 08/16/2013   GERD (gastroesophageal reflux disease)    High cholesterol    History of use of hearing aid in both ears    does not currently wear   Hypertension    Hypothyroidism    Type II diabetes mellitus (HCC)     Family History: Family History  Problem Relation Age of Onset   Colon cancer Mother    Cerebral aneurysm Father    Prostate cancer Neg Hx    Bladder Cancer Neg Hx    Kidney cancer Neg Hx     Social History   Socioeconomic History   Marital status: Married    Spouse name: Not on file   Number of children: Not on file   Years of education: Not on file   Highest education level: Not on file  Occupational History   Not on file  Tobacco Use   Smoking status: Former    Packs/day: 3.00    Years: 30.00    Pack  years: 90.00    Types: Cigarettes    Quit date: 40    Years since quitting: 34.9   Smokeless tobacco: Never   Tobacco comments:    "quit smoking in 1988"  Vaping Use   Vaping Use: Never used  Substance and Sexual Activity   Alcohol use: No   Drug use: No   Sexual activity: Never  Other Topics Concern   Not on file  Social History Narrative   Not on file   Social Determinants of Health   Financial Resource Strain: Not on file  Food Insecurity: Not on file  Transportation Needs: Not on file  Physical Activity: Not on file  Stress: Not on file  Social Connections: Not on file  Intimate Partner Violence: Not on file      Review of Systems  Constitutional:  Negative for chills, fatigue and unexpected weight change.  HENT:  Negative for congestion, rhinorrhea, sneezing and sore throat.   Eyes:  Negative for redness.  Respiratory:  Negative for cough, chest tightness and shortness of breath.   Cardiovascular:  Negative for chest pain and palpitations.  Gastrointestinal:  Negative for abdominal pain, constipation, diarrhea, nausea and vomiting.  Genitourinary:  Negative for  dysuria and frequency.  Musculoskeletal:  Negative for arthralgias, back pain, joint swelling and neck pain.  Skin:  Negative for rash.  Neurological: Negative.  Negative for tremors and numbness.  Hematological:  Negative for adenopathy. Does not bruise/bleed easily.  Psychiatric/Behavioral:  Negative for behavioral problems (Depression), sleep disturbance and suicidal ideas. The patient is not nervous/anxious.    Vital Signs: BP 120/72 Comment: 158/74  Pulse 97   Temp 98.5 F (36.9 C)   Resp 16   Ht 5\' 11"  (1.803 m)   Wt 252 lb 9.6 oz (114.6 kg)   SpO2 97%   BMI 35.23 kg/m    Physical Exam Vitals reviewed.  Constitutional:      General: He is not in acute distress.    Appearance: Normal appearance. He is obese. He is not ill-appearing.  HENT:     Head: Normocephalic and atraumatic.  Eyes:     Pupils: Pupils are equal, round, and reactive to light.  Cardiovascular:     Rate and Rhythm: Normal rate and regular rhythm.  Neurological:     Mental Status: He is alert and oriented to person, place, and time.     Cranial Nerves: No cranial nerve deficit.     Coordination: Coordination normal.     Gait: Gait normal.  Psychiatric:        Mood and Affect: Mood normal.        Behavior: Behavior normal.       Assessment/Plan: 1. Macrocytic anemia B12, folate and iron studies ordered. After labs, will consider hematology referral.  - Iron, TIBC and Ferritin Panel - B12 and Folate Panel  2. Chronic idiopathic thrombocytopenia (HCC) Chronic, consider hematology referral.   3. Essential hypertension Blood pressure is well controlled, continue medications as prescribed.   4. Acquired hypothyroidism Followed by Dr. , endocrinology.   5. Need for vaccination - Zoster Vaccine Adjuvanted [facility]) injection; Inject 0.5 mLs into the muscle once for 1 dose.  Dispense: 0.5 mL; Refill: 0 - pneumococcal 20-valent conjugate vaccine (PREVNAR 20) 0.5 ML injection; Inject  0.5 mLs into the muscle tomorrow at 10 am for 1 dose.  Dispense: 0.5 mL; Refill: 0   General Counseling: [facility] understanding of the findings of todays visit and agrees with plan of  treatment. I have discussed any further diagnostic evaluation that may be needed or ordered today. We also reviewed his medications today. he has been encouraged to call the office with any questions or concerns that should arise related to todays visit.    No orders of the defined types were placed in this encounter.   Meds ordered this encounter  Medications   Zoster Vaccine Adjuvanted [facility]) injection    Sig: Inject 0.5 mLs into the muscle once for 1 dose.    Dispense:  0.5 mL    Refill:  0   pneumococcal 20-valent conjugate vaccine (PREVNAR 20) 0.5 ML injection    Sig: Inject 0.5 mLs into the muscle tomorrow at 10 am for 1 dose.    Dispense:  0.5 mL    Refill:  0    Return in about 3 months (around 12/10/2021) for F/U, Recheck A1C, Lovis More PCP.   Total time spent:30 Minutes Time spent includes review of chart, medications, test results, and follow up plan with the patient.   [place] Controlled Substance Database was reviewed by me.  This patient was seen by Sallyanne Kuster, FNP-C in collaboration with Dr. Beverely Risen as a part of collaborative care agreement.   Takiesha Mcdevitt R. Tedd Sias, MSN, FNP-C Internal medicine

## 2021-09-14 ENCOUNTER — Other Ambulatory Visit: Payer: Self-pay | Admitting: Internal Medicine

## 2021-10-06 ENCOUNTER — Encounter: Payer: Self-pay | Admitting: Nurse Practitioner

## 2021-10-18 ENCOUNTER — Telehealth: Payer: Self-pay

## 2021-10-20 NOTE — Telephone Encounter (Signed)
error 

## 2021-10-29 ENCOUNTER — Telehealth: Payer: Self-pay | Admitting: Internal Medicine

## 2021-10-29 NOTE — Chronic Care Management (AMB) (Signed)
°  Chronic Care Management   Note  10/29/2021 Name: [name] MRN: [id number] DOB: 03-08-1941  [name] is a 80 y.o. year old male who is a primary care patient of Lyndon Code, MD. I reached out to [name] by phone today in response to a referral sent by [name]'s PCP, Lyndon Code, MD.   [name] was given information about Chronic Care Management services today including:  CCM service includes personalized support from designated clinical staff supervised by his physician, including individualized plan of care and coordination with other care providers 24/7 contact phone numbers for assistance for urgent and routine care needs. Service will only be billed when office clinical staff spend 20 minutes or more in a month to coordinate care. Only one practitioner may furnish and bill the service in a calendar month. The patient may stop CCM services at any time (effective at the end of the month) by phone call to the office staff.   Patient agreed to services and verbal consent obtained.   Follow up plan:   [employer] manager, fast food

## 2021-12-01 ENCOUNTER — Other Ambulatory Visit: Payer: Self-pay | Admitting: Internal Medicine

## 2021-12-09 ENCOUNTER — Ambulatory Visit
Admission: RE | Admit: 2021-12-09 | Discharge: 2021-12-09 | Disposition: A | Discharge status: Home / Self Care | Payer: Medicare HMO | Source: Ambulatory Visit | Attending: Nurse Practitioner | Admitting: Nurse Practitioner

## 2021-12-09 ENCOUNTER — Other Ambulatory Visit: Payer: Self-pay

## 2021-12-09 ENCOUNTER — Encounter: Payer: Self-pay | Admitting: Nurse Practitioner

## 2021-12-09 ENCOUNTER — Ambulatory Visit (INDEPENDENT_AMBULATORY_CARE_PROVIDER_SITE_OTHER): Payer: Medicare HMO | Admitting: Nurse Practitioner

## 2021-12-09 ENCOUNTER — Ambulatory Visit
Admission: RE | Admit: 2021-12-09 | Discharge: 2021-12-09 | Disposition: A | Discharge status: Home / Self Care | Payer: Medicare HMO | Attending: Nurse Practitioner | Admitting: Nurse Practitioner

## 2021-12-09 VITALS — BP 140/70 | HR 85 | Temp 98.2°F | Resp 16 | Ht 71.0 in | Wt 250.6 lb

## 2021-12-09 DIAGNOSIS — Z794 Long term (current) use of insulin: Secondary | ICD-10-CM

## 2021-12-09 DIAGNOSIS — E1165 Type 2 diabetes mellitus with hyperglycemia: Secondary | ICD-10-CM

## 2021-12-09 DIAGNOSIS — J9811 Atelectasis: Secondary | ICD-10-CM | POA: Diagnosis not present

## 2021-12-09 DIAGNOSIS — R0989 Other specified symptoms and signs involving the circulatory and respiratory systems: Secondary | ICD-10-CM

## 2021-12-09 DIAGNOSIS — R0602 Shortness of breath: Secondary | ICD-10-CM | POA: Insufficient documentation

## 2021-12-09 DIAGNOSIS — Z23 Encounter for immunization: Secondary | ICD-10-CM | POA: Diagnosis not present

## 2021-12-09 DIAGNOSIS — E119 Type 2 diabetes mellitus without complications: Secondary | ICD-10-CM

## 2021-12-09 DIAGNOSIS — I10 Essential (primary) hypertension: Secondary | ICD-10-CM

## 2021-12-09 LAB — POCT GLYCOSYLATED HEMOGLOBIN (HGB A1C): Hemoglobin A1C: 7.4 % — AB (ref 4.0–5.6)

## 2021-12-09 MED ORDER — LANTUS SOLOSTAR 100 UNIT/ML ~~LOC~~ SOPN: 60.0000 [IU] | PEN_INJECTOR | Freq: Every day | SUBCUTANEOUS | 2 refills | Status: DC

## 2021-12-09 MED ORDER — ALBUTEROL SULFATE HFA 108 (90 BASE) MCG/ACT IN AERS
2.0000 | INHALATION_SPRAY | Freq: Four times a day (QID) | RESPIRATORY_TRACT | 2 refills | Status: AC | PRN
Start: 2021-12-09 — End: ?

## 2021-12-09 MED ORDER — PNEUMOCOCCAL 20-VAL CONJ VACC 0.5 ML IM SUSY: 0.5000 mL | PREFILLED_SYRINGE | INTRAMUSCULAR | 0 refills | Status: AC

## 2021-12-09 NOTE — Progress Notes (Signed)
[facility] [place], [place] [address]  Internal MEDICINE  Office Visit Note  Patient Name: [name]  [id number]  [id number]  Date of Service: 12/26/2021  Chief Complaint  Patient presents with   Follow-up    SOB    Diabetes   Gastroesophageal Reflux   Hyperlipidemia   Hypertension    HPI [name] presents for a follow up visit for diabetes, hypertension and SOB. His A1C is 7.4 today which is up from 6.8 in august. Patient is unable to remember glucose levels when he checks them at home when asked. Patient unable to list specific food that he is eating. His blood pressure is stable with current medication.  His has been feeling short of breath lately. He was treated for pneumonia a few months ago. He reports having SOB and some wheezing.    Current Medication: Outpatient Encounter Medications as of 12/09/2021  Medication Sig   albuterol (VENTOLIN HFA) 108 (90 Base) MCG/ACT inhaler Inhale 2 puffs into the lungs every 6 (six) hours as needed for wheezing or shortness of breath.   aspirin EC 81 MG tablet Take 81 mg by mouth daily.   atorvastatin (LIPITOR) 40 MG tablet TAKE 1 TABLET BY MOUTH EVERY DAY   Cholecalciferol 1000 units capsule Take 1,000 Units by mouth daily.    Cinnamon 500 MG TABS Take 1,000 mg by mouth daily.   Glucosamine-Chondroitin 500-400 MG CAPS Take 3 tablets by mouth daily.   HUMULIN R 100 UNIT/ML injection Inject 0.15-0.17 mLs (15-17 Units total) into the skin 2 (two) times daily before a meal. And per sliding scale Pt is not to get novolin at all (Patient taking differently: Inject 15-17 Units into the skin 2 (two) times daily before a meal. 16 units in the morning and per sliding scale Pt is not to get novolin at all)   hydrochlorothiazide (HYDRODIURIL) 25 MG tablet TAKE 1 TABLET BY MOUTH EVERY DAY IN THE MORNING   insulin regular (HUMULIN R) 100 units/mL injection USE 17 UNITS SUBCUTANEOUSLY TWICE DAILY WITH MEALS AND USE A  SLIDING SCALE IF SUGARS GET HIGH   [place] Ginseng 1000 MG TABS Take 1,000 mg by mouth daily.   Omega 3 1200 MG CAPS Take 1,200 mg by mouth daily.   ONE TOUCH ULTRA TEST test strip CHECK SUGAR 5 X A DAY FOR DX 250.3 & FLUCTUATING SUGAR   pantoprazole (PROTONIX) 40 MG tablet TAKE 1 TABLET BY MOUTH EVERY DAY   pyridOXINE (VITAMIN B-6) 100 MG tablet Take 100 mg by mouth daily.   thyroid (ARMOUR) 120 MG tablet Take 120 mg by mouth daily before breakfast.   zinc gluconate 50 MG tablet Take 50 mg by mouth daily.   [DISCONTINUED] benazepril (LOTENSIN) 40 MG tablet TAKE 1 TABLET BY MOUTH EVERY DAY   [DISCONTINUED] Insulin Glargine (LANTUS SOLOSTAR) 100 UNIT/ML Solostar Pen Inject 65 Units into the skin daily at 10 pm.   [DISCONTINUED] insulin glargine (LANTUS) 100 UNIT/ML injection Inject 0.56 mLs (56 Units total) into the skin daily with breakfast.   [DISCONTINUED] pneumococcal 20-valent conjugate vaccine (PREVNAR 20) 0.5 ML injection Inject 0.5 mLs into the muscle tomorrow at 10 am.   [DISCONTINUED] predniSONE (DELTASONE) 10 MG tablet Take one tab 3 x day for 3 days, then take one tab 2 x a day for 3 days and then take one tab a day for 3 days for copd (Patient not taking: Reported on 12/23/2021)   insulin glargine (LANTUS SOLOSTAR) 100 UNIT/ML Solostar  Pen Inject 60 Units into the skin daily at 10 pm.   [EXPIRED] pneumococcal 20-valent conjugate vaccine (PREVNAR 20) 0.5 ML injection Inject 0.5 mLs into the muscle tomorrow at 10 am for 1 dose.   No facility-administered encounter medications on file as of 12/09/2021.    Surgical History: Past Surgical History:  Procedure Laterality Date   CARPAL TUNNEL RELEASE Right 1980's   CARPAL TUNNEL RELEASE Left 05/05/2016   Procedure: CARPAL TUNNEL RELEASE;  Surgeon: Hessie Knows, MD;  Location: [facility];  Service: Orthopedics;  Laterality: Left;   CATARACT EXTRACTION W/PHACO Left 05/12/2015   Procedure: CATARACT EXTRACTION PHACO AND INTRAOCULAR LENS PLACEMENT  (IOC);  Surgeon: Birder Robson, MD;  Location: [facility];  Service: Ophthalmology;  Laterality: Left;  [place] 01:53.1AP% 21.4CDE 24.6fluid pack lot # [id number]   CATARACT EXTRACTION W/PHACO Right 07/02/2019   Procedure: CATARACT EXTRACTION PHACO AND INTRAOCULAR LENS PLACEMENT (IOC) RIGHT DIABETIC  01:53.2  18.9%  21.43;  Surgeon: Birder Robson, MD;  Location: [place];  Service: Ophthalmology;  Laterality: Right;  Diabetic - insulin   EYE SURGERY Left    cataract extractions   INCISION AND DRAINAGE OF WOUND Left 1988   "staph infection" left leg   INTRAMEDULLARY (IM) NAIL INTERTROCHANTERIC Right 01/22/2018   Procedure: INTRAMEDULLARY (IM) NAIL INTERTROCHANTRIC;  Surgeon: Thornton Park, MD;  Location: [facility];  Service: Orthopedics;  Laterality: Right;   KNEE ARTHROPLASTY Left 12/28/2015   Procedure: COMPUTER ASSISTED TOTAL KNEE ARTHROPLASTY;  Surgeon: Dereck Leep, MD;  Location: [facility];  Service: Orthopedics;  Laterality: Left;   ORIF WRIST FRACTURE Left 05/05/2016   Procedure: OPEN REDUCTION INTERNAL FIXATION (ORIF) WRIST FRACTURE;  Surgeon: Hessie Knows, MD;  Location: [facility];  Service: Orthopedics;  Laterality: Left;    Medical History: Past Medical History:  Diagnosis Date   Agent orange exposure    [place] war exposure    Arthritis    "left knee" (03/18/2014)   Closed pelvic fracture ([place]) 03/18/2014   "multiple S/P fall w/loss of consciousness; CBG 29"   Encephalopathy acute 08/16/2013   GERD (gastroesophageal reflux disease)    High cholesterol    History of use of hearing aid in both ears    does not currently wear   Hypertension    Hypothyroidism    Type II diabetes mellitus ([place])     Family History: Family History  Problem Relation Age of Onset   Colon cancer Mother    Cerebral aneurysm Father    Prostate cancer Neg Hx    Bladder Cancer Neg Hx    Kidney cancer Neg Hx     Social History   Socioeconomic History   Marital status: Married     Spouse name: Not on file   Number of children: Not on file   Years of education: Not on file   Highest education level: Not on file  Occupational History   Not on file  Tobacco Use   Smoking status: Former    Packs/day: 3.00    Years: 30.00    Pack years: 90.00    Types: Cigarettes    Quit date: 4    Years since quitting: 35.1   Smokeless tobacco: Never   Tobacco comments:    "quit smoking in 1988"  Vaping Use   Vaping Use: Never used  Substance and Sexual Activity   Alcohol use: No   Drug use: No   Sexual activity: Never  Other Topics Concern   Not on file  Social History  Narrative   Not on file   Social Determinants of Health   Financial Resource Strain: Not on file  Food Insecurity: Not on file  Transportation Needs: Not on file  Physical Activity: Not on file  Stress: Not on file  Social Connections: Not on file  Intimate Partner Violence: Not on file      Review of Systems  Constitutional:  Negative for chills, fatigue and unexpected weight change.  HENT:  Negative for congestion, postnasal drip, rhinorrhea, sneezing and sore throat.   Eyes:  Negative for redness.  Respiratory:  Positive for cough, chest tightness, shortness of breath and wheezing.   Cardiovascular: Negative.  Negative for chest pain and palpitations.  Gastrointestinal:  Negative for abdominal pain, constipation, diarrhea, nausea and vomiting.  Genitourinary:  Negative for dysuria and frequency.  Musculoskeletal:  Negative for arthralgias, back pain, joint swelling and neck pain.  Skin:  Negative for rash.  Neurological: Negative.  Negative for tremors and numbness.  Hematological:  Negative for adenopathy. Does not bruise/bleed easily.  Psychiatric/Behavioral:  Negative for behavioral problems (Depression), sleep disturbance and suicidal ideas. The patient is not nervous/anxious.    Vital Signs: BP 140/70    Pulse 85    Temp 98.2 F (36.8 C)    Resp 16    Ht 5\' 11"  (1.803 m)    Wt 250  lb 9.6 oz (113.7 kg)    SpO2 97%    BMI 34.95 kg/m    Physical Exam Pulmonary:     Effort: Tachypnea, accessory muscle usage and prolonged expiration present. No respiratory distress.     Breath sounds: Decreased air movement present. Examination of the right-upper field reveals decreased breath sounds and wheezing. Examination of the left-upper field reveals decreased breath sounds and wheezing. Examination of the right-lower field reveals rales. Examination of the left-lower field reveals rales. Decreased breath sounds, wheezing and rales present.       Assessment/Plan: 1. Shortness of breath Albuterol inhaler prescribed to decrease wheezing and SOB. Chest xray ordered to rule out pneumonia and pulmonary edema. Echo ordered to further evaluate SOB for cardiac etiology.  - ECHOCARDIOGRAM COMPLETE; Future - DG Chest 2 View; Future - albuterol (VENTOLIN HFA) 108 (90 Base) MCG/ACT inhaler; Inhale 2 puffs into the lungs every 6 (six) hours as needed for wheezing or shortness of breath.  Dispense: 8 g; Refill: 2  2. Bilateral rales Chest xray ordered to rule out pneumonia and pulmonary edema.  - DG Chest 2 View; Future  3. Type 2 diabetes mellitus with hyperglycemia, with long-term current use of insulin (HCC) A1C is elevated further compared to august. Discussed diet modifications and encouraged patient to follow ADA diet to get glucose under control. Diet changes were discussed in detail with patient. Follow up in 3 months to repeat A1C - POCT glycosylated hemoglobin (Hb A1C) - insulin glargine (LANTUS SOLOSTAR) 100 UNIT/ML Solostar Pen; Inject 60 Units into the skin daily at 10 pm.  Dispense: 15 mL; Refill: 2  4. Essential hypertension BP is stable, continue medications as prescribed.   5. Need for vaccination - pneumococcal 20-valent conjugate vaccine (PREVNAR 20) 0.5 ML injection; Inject 0.5 mLs into the muscle tomorrow at 10 am for 1 dose.  Dispense: 0.5 mL; Refill: 0   General  Counseling: Malva Cogan understanding of the findings of todays visit and agrees with plan of treatment. I have discussed any further diagnostic evaluation that may be needed or ordered today. We also reviewed his medications today. he has  been encouraged to call the office with any questions or concerns that should arise related to todays visit.    Orders Placed This Encounter  Procedures   DG Chest 2 View   POCT glycosylated hemoglobin (Hb A1C)   ECHOCARDIOGRAM COMPLETE    Meds ordered this encounter  Medications   pneumococcal 20-valent conjugate vaccine (PREVNAR 20) 0.5 ML injection    Sig: Inject 0.5 mLs into the muscle tomorrow at 10 am for 1 dose.    Dispense:  0.5 mL    Refill:  0   insulin glargine (LANTUS SOLOSTAR) 100 UNIT/ML Solostar Pen    Sig: Inject 60 Units into the skin daily at 10 pm.    Dispense:  15 mL    Refill:  2   albuterol (VENTOLIN HFA) 108 (90 Base) MCG/ACT inhaler    Sig: Inhale 2 puffs into the lungs every 6 (six) hours as needed for wheezing or shortness of breath.    Dispense:  8 g    Refill:  2    Return in about 2 weeks (around 12/23/2021) for F/U, SOB Yaritzi Craun PCP.   Total time spent:30 Minutes Time spent includes review of chart, medications, test results, and follow up plan with the patient.   [place] Controlled Substance Database was reviewed by me.  This patient was seen by Jonetta Osgood, FNP-C in collaboration with Dr. Clayborn Bigness as a part of collaborative care agreement.   Linken Mcglothen R. Valetta Fuller, MSN, FNP-C Internal medicine

## 2021-12-13 ENCOUNTER — Telehealth: Payer: Self-pay

## 2021-12-13 DIAGNOSIS — M79675 Pain in left toe(s): Secondary | ICD-10-CM | POA: Diagnosis not present

## 2021-12-13 DIAGNOSIS — B351 Tinea unguium: Secondary | ICD-10-CM | POA: Diagnosis not present

## 2021-12-13 DIAGNOSIS — L600 Ingrowing nail: Secondary | ICD-10-CM | POA: Diagnosis not present

## 2021-12-13 DIAGNOSIS — M79674 Pain in right toe(s): Secondary | ICD-10-CM | POA: Diagnosis not present

## 2021-12-13 DIAGNOSIS — E119 Type 2 diabetes mellitus without complications: Secondary | ICD-10-CM | POA: Diagnosis not present

## 2021-12-13 NOTE — Telephone Encounter (Signed)
-----   Message from Sallyanne Kuster, NP sent at 12/13/2021  8:25 AM EST ----- Please let patient know that he does not have any pneumonia according to his chest xray. Please ask how his breathing is doing

## 2021-12-13 NOTE — Progress Notes (Signed)
Please let patient know that he does not have any pneumonia according to his chest xray. Please ask how his breathing is doing

## 2021-12-20 ENCOUNTER — Other Ambulatory Visit: Payer: Self-pay

## 2021-12-20 MED ORDER — BENAZEPRIL HCL 40 MG PO TABS: 40.0000 mg | ORAL_TABLET | Freq: Every day | ORAL | 2 refills | Status: DC

## 2021-12-23 ENCOUNTER — Encounter: Payer: Self-pay | Admitting: Nurse Practitioner

## 2021-12-23 ENCOUNTER — Other Ambulatory Visit: Payer: Self-pay

## 2021-12-23 ENCOUNTER — Ambulatory Visit (INDEPENDENT_AMBULATORY_CARE_PROVIDER_SITE_OTHER): Payer: Medicare HMO | Admitting: Nurse Practitioner

## 2021-12-23 VITALS — BP 136/60 | HR 78 | Temp 98.5°F | Resp 16 | Ht 71.0 in | Wt 245.6 lb

## 2021-12-23 DIAGNOSIS — E1165 Type 2 diabetes mellitus with hyperglycemia: Secondary | ICD-10-CM | POA: Diagnosis not present

## 2021-12-23 DIAGNOSIS — Z794 Long term (current) use of insulin: Secondary | ICD-10-CM | POA: Diagnosis not present

## 2021-12-23 DIAGNOSIS — D539 Nutritional anemia, unspecified: Secondary | ICD-10-CM

## 2021-12-23 DIAGNOSIS — I10 Essential (primary) hypertension: Secondary | ICD-10-CM | POA: Diagnosis not present

## 2021-12-23 DIAGNOSIS — R0602 Shortness of breath: Secondary | ICD-10-CM | POA: Diagnosis not present

## 2021-12-23 NOTE — Progress Notes (Signed)
[facility] [place], [place] [address]  Internal MEDICINE  Office Visit Note  Patient Name: [name]  [id number]  [id number]  Date of Service: 12/23/2021  Chief Complaint  Patient presents with   Follow-up   Diabetes   Gastroesophageal Reflux   Hyperlipidemia   Hypertension    HPI Sami presents for a follow-up visit for shortness of breath and wheezing.  He reports that when he was seen previously couple of weeks ago he had a cold but he is feeling better now.  He reports that he feels like he is breathing better as well.  He had a chest x-ray done to rule out pneumonia and it was normal and did not show any pneumonia.  He has a couple of labs from December that he has not had drawn yet and he is reminded to have them drawn, those labs are a B12 and folate panel as well as an iron panel to rule out deficiencies.  He has lost 5 pounds since his previous office visit.  He continues to follow-up through the [place].  He will be due for his A1c in 3 months.  His A1c 2 weeks ago was 7.4 which is elevated.  Since his last office visit he was seen by his podiatrist Dr. Caryl Comes at [place] [facility] and had his toenails clipped.   Current Medication: Outpatient Encounter Medications as of 12/23/2021  Medication Sig   albuterol (VENTOLIN HFA) 108 (90 Base) MCG/ACT inhaler Inhale 2 puffs into the lungs every 6 (six) hours as needed for wheezing or shortness of breath.   aspirin EC 81 MG tablet Take 81 mg by mouth daily.   atorvastatin (LIPITOR) 40 MG tablet TAKE 1 TABLET BY MOUTH EVERY DAY   benazepril (LOTENSIN) 40 MG tablet Take 1 tablet (40 mg total) by mouth daily.   Cholecalciferol 1000 units capsule Take 1,000 Units by mouth daily.    Cinnamon 500 MG TABS Take 1,000 mg by mouth daily.   Glucosamine-Chondroitin 500-400 MG CAPS Take 3 tablets by mouth daily.   HUMULIN R 100 UNIT/ML injection Inject 0.15-0.17 mLs (15-17 Units total) into the skin 2 (two) times daily  before a meal. And per sliding scale Pt is not to get novolin at all (Patient taking differently: Inject 15-17 Units into the skin 2 (two) times daily before a meal. 16 units in the morning and per sliding scale Pt is not to get novolin at all)   hydrochlorothiazide (HYDRODIURIL) 25 MG tablet TAKE 1 TABLET BY MOUTH EVERY DAY IN THE MORNING   insulin glargine (LANTUS SOLOSTAR) 100 UNIT/ML Solostar Pen Inject 60 Units into the skin daily at 10 pm.   insulin regular (HUMULIN R) 100 units/mL injection USE 17 UNITS SUBCUTANEOUSLY TWICE DAILY WITH MEALS AND USE A SLIDING SCALE IF SUGARS GET HIGH   [place] Ginseng 1000 MG TABS Take 1,000 mg by mouth daily.   Omega 3 1200 MG CAPS Take 1,200 mg by mouth daily.   ONE TOUCH ULTRA TEST test strip CHECK SUGAR 5 X A DAY FOR DX 250.3 & FLUCTUATING SUGAR   pantoprazole (PROTONIX) 40 MG tablet TAKE 1 TABLET BY MOUTH EVERY DAY   pyridOXINE (VITAMIN B-6) 100 MG tablet Take 100 mg by mouth daily.   thyroid (ARMOUR) 120 MG tablet Take 120 mg by mouth daily before breakfast.   zinc gluconate 50 MG tablet Take 50 mg by mouth daily.   [DISCONTINUED] predniSONE (DELTASONE) 10 MG tablet Take one tab 3  x day for 3 days, then take one tab 2 x a day for 3 days and then take one tab a day for 3 days for copd (Patient not taking: Reported on 12/23/2021)   No facility-administered encounter medications on file as of 12/23/2021.    Surgical History: Past Surgical History:  Procedure Laterality Date   CARPAL TUNNEL RELEASE Right 1980's   CARPAL TUNNEL RELEASE Left 05/05/2016   Procedure: CARPAL TUNNEL RELEASE;  Surgeon: [place]ie Knows, MD;  Location: [facility];  Service: Orthopedics;  Laterality: Left;   CATARACT EXTRACTION W/PHACO Left 05/12/2015   Procedure: CATARACT EXTRACTION PHACO AND INTRAOCULAR LENS PLACEMENT (IOC);  Surgeon: Birder Robson, MD;  Location: [facility];  Service: Ophthalmology;  Laterality: Left;  [place] 01:53.1AP% 21.4CDE 24.69fluid pack lot # [id number]   CATARACT  EXTRACTION W/PHACO Right 07/02/2019   Procedure: CATARACT EXTRACTION PHACO AND INTRAOCULAR LENS PLACEMENT (IOC) RIGHT DIABETIC  01:53.2  18.9%  21.43;  Surgeon: Birder Robson, MD;  Location: [place];  Service: Ophthalmology;  Laterality: Right;  Diabetic - insulin   EYE SURGERY Left    cataract extractions   INCISION AND DRAINAGE OF WOUND Left 1988   "staph infection" left leg   INTRAMEDULLARY (IM) NAIL INTERTROCHANTERIC Right 01/22/2018   Procedure: INTRAMEDULLARY (IM) NAIL INTERTROCHANTRIC;  Surgeon: Thornton Park, MD;  Location: [facility];  Service: Orthopedics;  Laterality: Right;   KNEE ARTHROPLASTY Left 12/28/2015   Procedure: COMPUTER ASSISTED TOTAL KNEE ARTHROPLASTY;  Surgeon: Dereck Leep, MD;  Location: [facility];  Service: Orthopedics;  Laterality: Left;   ORIF WRIST FRACTURE Left 05/05/2016   Procedure: OPEN REDUCTION INTERNAL FIXATION (ORIF) WRIST FRACTURE;  Surgeon: [place]ie Knows, MD;  Location: [facility];  Service: Orthopedics;  Laterality: Left;    Medical History: Past Medical History:  Diagnosis Date   Agent orange exposure    [place] war exposure    Arthritis    "left knee" (03/18/2014)   Closed pelvic fracture ([place]) 03/18/2014   "multiple S/P fall w/loss of consciousness; CBG 29"   Encephalopathy acute 08/16/2013   GERD (gastroesophageal reflux disease)    High cholesterol    History of use of hearing aid in both ears    does not currently wear   Hypertension    Hypothyroidism    Type II diabetes mellitus ([place])     Family History: Family History  Problem Relation Age of Onset   Colon cancer Mother    Cerebral aneurysm Father    Prostate cancer Neg Hx    Bladder Cancer Neg Hx    Kidney cancer Neg Hx     Social History   Socioeconomic History   Marital status: Married    Spouse name: Not on file   Number of children: Not on file   Years of education: Not on file   Highest education level: Not on file  Occupational History   Not on file   Tobacco Use   Smoking status: Former    Packs/day: 3.00    Years: 30.00    Pack years: 90.00    Types: Cigarettes    Quit date: 54    Years since quitting: 35.1   Smokeless tobacco: Never   Tobacco comments:    "quit smoking in 1988"  Vaping Use   Vaping Use: Never used  Substance and Sexual Activity   Alcohol use: No   Drug use: No   Sexual activity: Never  Other Topics Concern   Not on file  Social History Narrative  Not on file   Social Determinants of Health   Financial Resource Strain: Not on file  Food Insecurity: Not on file  Transportation Needs: Not on file  Physical Activity: Not on file  Stress: Not on file  Social Connections: Not on file  Intimate Partner Violence: Not on file      Review of Systems  Constitutional:  Negative for chills, fatigue and unexpected weight change.  HENT:  Negative for congestion, rhinorrhea, sneezing and sore throat.   Eyes:  Negative for redness.  Respiratory:  Negative for cough, chest tightness and shortness of breath.   Cardiovascular:  Negative for chest pain and palpitations.  Gastrointestinal:  Negative for abdominal pain, constipation, diarrhea, nausea and vomiting.  Genitourinary:  Negative for dysuria and frequency.  Musculoskeletal:  Negative for arthralgias, back pain, joint swelling and neck pain.  Skin:  Negative for rash.  Neurological: Negative.  Negative for tremors and numbness.  Hematological:  Negative for adenopathy. Does not bruise/bleed easily.  Psychiatric/Behavioral:  Negative for behavioral problems (Depression), sleep disturbance and suicidal ideas. The patient is not nervous/anxious.    Vital Signs: BP 136/60    Pulse 78    Temp 98.5 F (36.9 C)    Resp 16    Ht 5\' 11"  (1.803 m)    Wt 245 lb 9.6 oz (111.4 kg)    SpO2 97%    BMI 34.25 kg/m    Physical Exam Constitutional:      General: He is not in acute distress.    Appearance: Normal appearance. He is obese. He is not ill-appearing.   HENT:     Head: Normocephalic and atraumatic.  Eyes:     Pupils: Pupils are equal, round, and reactive to light.  Cardiovascular:     Rate and Rhythm: Normal rate and regular rhythm.     Heart sounds: Normal heart sounds. No murmur heard. Pulmonary:     Effort: Pulmonary effort is normal. No respiratory distress.     Breath sounds: Normal breath sounds. No wheezing.  Neurological:     Mental Status: He is alert and oriented to person, place, and time.  Psychiatric:        Mood and Affect: Mood normal.        Behavior: Behavior normal.       Assessment/Plan: 1. Shortness of breath Problem has improved, chest x-ray was negative for pneumonia.  Patient denies shortness of breath.  2. Type 2 diabetes mellitus with hyperglycemia, with long-term current use of insulin ([place]) Patient was recently seen by podiatry for diabetic foot care.  We will follow-up in 3 months to repeat A1c.  3. Essential hypertension Blood pressure is stable with current medications, no changes at this time.  4. Macrocytic anemia Patient reminded to have labs drawn for B12 and folate panel and iron panel to further evaluate macrocytic anemia   General Counseling: Malva Cogan understanding of the findings of todays visit and agrees with plan of treatment. I have discussed any further diagnostic evaluation that may be needed or ordered today. We also reviewed his medications today. he has been encouraged to call the office with any questions or concerns that should arise related to todays visit.    No orders of the defined types were placed in this encounter.   No orders of the defined types were placed in this encounter.   Return in about 3 months (around 03/22/2022) for F/U, Recheck A1C, Deana Krock PCP.   Total time spent:30 Minutes Time spent  includes review of chart, medications, test results, and follow up plan with the patient.   [place] Controlled Substance Database was reviewed by me.  This  patient was seen by Jonetta Osgood, FNP-C in collaboration with Dr. Clayborn Bigness as a part of collaborative care agreement.   Chadrick Sprinkle R. Valetta Fuller, MSN, FNP-C Internal medicine

## 2021-12-26 ENCOUNTER — Encounter: Payer: Self-pay | Admitting: Nurse Practitioner

## 2022-01-05 ENCOUNTER — Ambulatory Visit: Payer: Medicare HMO

## 2022-01-05 ENCOUNTER — Other Ambulatory Visit: Payer: Self-pay

## 2022-01-05 DIAGNOSIS — R0602 Shortness of breath: Secondary | ICD-10-CM | POA: Diagnosis not present

## 2022-01-12 ENCOUNTER — Ambulatory Visit: Payer: Medicare HMO | Admitting: Student-PharmD

## 2022-01-12 DIAGNOSIS — E039 Hypothyroidism, unspecified: Secondary | ICD-10-CM

## 2022-01-12 DIAGNOSIS — E1165 Type 2 diabetes mellitus with hyperglycemia: Secondary | ICD-10-CM

## 2022-01-12 DIAGNOSIS — E782 Mixed hyperlipidemia: Secondary | ICD-10-CM

## 2022-01-12 DIAGNOSIS — I10 Essential (primary) hypertension: Secondary | ICD-10-CM

## 2022-01-12 NOTE — Progress Notes (Signed)
Initial Pharmacist Visit ? [name], KROOK [id number] ?41 years, Male  DOB: July 17, 1941  M: [phone] ? ?Clinical Summary ?Situation:: Patient presents in office with his wife, along with all of his medications. ?Background:: Patient is managed by endocrinologist for his thyroid. Levels were high at last check. A1C was also elevated in february. Patient reports sugars mostly ranging in the upper 100s to low 200s. Does eat alot of sandwiches and crackers. [facility]. ?Assessment:: -Diabetes is uncontrolled at this time. Due for another A1C check in May. ?-Patient was not taking his armour thyroid correctly (at night, with food, not separated from other meds) ?Recommendations::  ?-Recommended patient increase his activity level as tolerated and discussed diet changes. Will monitor A1C ?-Recommended patient notify us if Semglee change affects his sugars. ?-Instructed patient on proper administration of armour thryoid. ? ?Visit Details ?Patient scheduled for CCM visit with the clinical pharmacist.  Patient is referred for CCM by their PCP and CPP is under general PCP supervision.: At least 2 of these conditions are expected to last 12 months or longer and patient is at significant risk for acute exacerbations and/or functional decline.  Patient has consented to participation in [place] program. ?Visit Type: Clinic visit ?Date of Visit: 01/12/2022 ? ?Patient Chart Prep  ?Completed by Charlann Lange on 01/10/2022 ? ?Chronic Conditions ?Patient's Chronic Conditions: Hypertension (HTN), Gastroesophageal Reflux Disease (GERD), Diabetes (DM), Hypothyroidism, Osteoarthritis, Chronic Kidney Disease (CKD), Hyperlipidemia/Dyslipidemia (HLD) ? ?Doctor and Hospital Visits ?Were there PCP Visits in last 6 months?: Yes ?Visit #1: 08/10/21 Jonetta Osgood, NP. For Prostate cancer screening. STARTED Azithromycin 500 mg daily and Prednisone 10 mg Take one tab 3 x day for 3 days,  then take one tab 2 x a day for 3 days and then take one tab a day for 3 days for copd. ?Visit #2: 08/18/21 Devona Konig A. For Occlusion and stenosis of bilateral carotid arteries. No other information. ?Visit #3: 09/09/21 Jonetta Osgood, NP. For Macrocytic anemia. STOPPED Amoxicillin. ?Visit #4: 12/09/21 Jonetta Osgood, NP. For follow-up. STARTED Albuterol Sulfate 108 MCG/ACT 2 puffs inhalation every 6 hours PRN. CHANGED Insulin Glargine to 60 units subcutaneous daily at 10 PM. ?Visit #5: 12/23/21 Jonetta Osgood, NP. For follow-up. STOPPED Prednisone. ?Were there Specialist Visits in last 6 months?: Yes ?Visit #1: 07/14/21 [facility] [place], [place], [place]. For wound care. no medication changes. PER NOTE: Apply Hydrofera Blue Ready to wound bed as directed. ?Visit #2: 07/28/21 [facility], [place], [place]. For wound care. no medication changes. No medication changes. ?Visit #3: 10/18/21  [facility]. No medication changes. ?Visit #4: 12/13/21 Podiatry Yvetta Coder, DPM. For Diabetes mellitus without complication. ?Was there a Hospital Visit in last 30 days?: No ?Were there other Hospital Visits in last 6 months?: No ? ?Medication Information ?Are there any Medication discrepancies?: No ?Are there any Medication adherence gaps (beyond 5 days past due)?: No ?Medication adherence rates for the STAR rating drugs: Benazepril 40 mg 12/20/21 90 DS ?Atorvastatin 40 mg 12/03/21 90 DS ?List Patient's current Care Gaps: No current Care Gaps identified ? ?Pre-Call Questions  ?Completed by Charlann Lange on 01/11/2022 ? ?Are you able to connect with Patient: Yes ?Visit Type: Clinic visit ?Confirm visit date/time: 01/12/22 at 9:15 AM ?Have you seen any other providers since your last visit?: No ?Any changes in your medicines or health?: No ?Any side  effects from any medications?: No ?Do  you have any symptoms or problems not managed by your medications?: No ?What are your top 2 health concerns right now?: Patients wife stated none. ?What are your top 2 medication concerns right now?: Drug interactions ?Has your Dr asked that you take BP, BG or special diet at home?: Monitor BG ?Do you get any type of exercise on a regular basis?: No ?What are your top 1 or 2 goals for your health?: Patients wife stated a improvement in his memory. ?What are you doing to try to reach these goals?: Taking medications, Monitoring BP, Monitoring BG ?What, if any, problems do you have getting your medications from the pharmacy?: None ?Is there anything else that you would like to discuss during the appointment?: No ? ?Disease Assessments ? ?Subjective Information ?Current BP: 130/60 ?Current HR: 78 ?taken on: 12/23/2021 ?Previous BP: 140/70 ?Previous HR: 85 ?taken on: 12/09/2021 ?Weight: 78 ?BMI: 34.25  ?Last GFR: 80 ?taken on: 09/02/2021 ?Why did the patient present?: CCM Initial Visit ?Marital status?: Married ?Details: been married almost 42 years ?Education level?: [employer] graduate ?Retired? Previous work?: worked for [employer] for nearly 5 years, now retired ?What does the patient do during the day?: Patient likes to take care of his yard, has many acres of land ?Who does the patient spend their time with and what do they do?: With his wife, they go out to eat occasionally but mostly stay at home ?Lifestyle habits such as diet and exercise?: Diet: discussed in diabetes section ?Exercise: yardwork (so decreases in winter) ?Alcohol, tobacco, and illicit drug usage?: None ?What is the patient's sleep pattern?: No sleep issues ?How many hours per night does patient typically sleep?: 6+ ?Patient pleased with health care they are receiving?: Yes ?Family, occupational, and living circumstances relevant to overall health?: - ?Factors that may affect medication adherence?: Pill burden ?[facility] ?Current Rx insurance plan: Aetna ?Are meds synced by current pharmacy?: No ?Are meds delivered by current pharmacy?: No - delivery available but patient prefers to not use ?Would patient benefit from direct intervention of clinical lead in dispensing process to optimize clinical outcomes?: Yes ?Are [facility] pharmacy services available where patient lives?: Yes ?Is patient disadvantaged to use [facility]?: Yes ?Does patient experience delays in picking up medications due to transportation concerns (getting to pharmacy)?: No ?Any additional demeanor/mood notes?: Patient presents with wife and all of his medications. Very pleasant to speak with ? ?Hypertension (HTN) ?Assess this condition today?: Yes ?Is patient able to obtain BP reading today?: No ?Goal: <140/90 mmHG ?Hypertension Stage: Stage 1 (SBP: 130-139 or DBP: 80-89) ?Is Patient checking BP at home?: No ?How often does patient miss taking their blood pressure medications?: none ?Has patient experienced hypotension, dizziness, falls or bradycardia?: No ?BP RPM device: Does patient qualify?: No ?We discussed: DASH diet:  following a diet emphasizing fruits and vegetables and low-fat dairy products along with whole grains, fish, poultry, and nuts. Reducing red meats and sugars., Targeting 150 minutes of aerobic activity per week, Proper Home BP Measurement ?Assessment:: Controlled ?Drug: Carvedilol 6.25mg  1 tab twice daily ?Assessment: Appropriate, Effective, Safe, Accessible ?Drug: Benazepril 40mg  1 tab daily ?Assessment: Appropriate, Effective, Safe, Accessible ?Drug: Hydrochlorothiazide 25mg  1 tab every morning ?Assessment: Appropriate, Effective, Safe, Accessible ?Additional Info: Patient has a BP device at home but has not been using. Encouraged to check BP at least once weekly ?Plan to: Continue medication therapy ?HC Follow up: 1 month, 2  months ?Pharmacist Follow up: 3 months ? ?Hyperlipidemia/Dyslipidemia (HLD) ?Last Lipid panel on:  09/02/2021 ?TC (Goal<200): 105 ?LDL: 37 ?HDL (Goal>40): 47 ?TG (Goal<150): 118 ?ASCVD 10-year risk?is:: N/A due to Age > 87 ?Assess this condition today?: Yes ?LDL Goal: <70 ?Has patient tried and failed any HLD

## 2022-01-17 ENCOUNTER — Telehealth: Payer: Self-pay | Admitting: Student-PharmD

## 2022-01-17 NOTE — Progress Notes (Signed)
? ? ?  Chronic Care Management ?Pharmacy Assistant  ? ?Name: [name]  MRN: [id number] DOB: [birth year] ? ? ?Reason for Encounter: Care Plan and Patient Handout  ? ?Medications: ?Outpatient Encounter Medications as of 01/17/2022  ?Medication Sig  ? albuterol (VENTOLIN HFA) 108 (90 Base) MCG/ACT inhaler Inhale 2 puffs into the lungs every 6 (six) hours as needed for wheezing or shortness of breath.  ? aspirin EC 81 MG tablet Take 81 mg by mouth daily.  ? atorvastatin (LIPITOR) 40 MG tablet TAKE 1 TABLET BY MOUTH EVERY DAY  ? benazepril (LOTENSIN) 40 MG tablet Take 1 tablet (40 mg total) by mouth daily.  ? Cholecalciferol 1000 units capsule Take 1,000 Units by mouth daily.   ? Cinnamon 500 MG TABS Take 1,000 mg by mouth daily.  ? Glucosamine-Chondroitin 500-400 MG CAPS Take 3 tablets by mouth daily.  ? HUMULIN R 100 UNIT/ML injection Inject 0.15-0.17 mLs (15-17 Units total) into the skin 2 (two) times daily before a meal. And per sliding scale ?Pt is not to get novolin at all (Patient taking differently: Inject 15-17 Units into the skin 2 (two) times daily before a meal. 16 units in the morning and per sliding scale ?Pt is not to get novolin at all)  ? hydrochlorothiazide (HYDRODIURIL) 25 MG tablet TAKE 1 TABLET BY MOUTH EVERY DAY IN THE MORNING  ? insulin glargine (LANTUS SOLOSTAR) 100 UNIT/ML Solostar Pen Inject 60 Units into the skin daily at 10 pm.  ? insulin regular (HUMULIN R) 100 units/mL injection USE 17 UNITS SUBCUTANEOUSLY TWICE DAILY WITH MEALS AND USE A SLIDING SCALE IF SUGARS GET HIGH  ? Korean Ginseng 1000 MG TABS Take 1,000 mg by mouth daily.  ? Omega 3 1200 MG CAPS Take 1,200 mg by mouth daily.  ? ONE TOUCH ULTRA TEST test strip CHECK SUGAR 5 X A DAY FOR DX 250.3 & FLUCTUATING SUGAR  ? pantoprazole (PROTONIX) 40 MG tablet TAKE 1 TABLET BY MOUTH EVERY DAY  ? pyridOXINE (VITAMIN B-6) 100 MG tablet Take 100 mg by mouth daily.  ? thyroid (ARMOUR) 120 MG tablet Take 120 mg by mouth daily before  breakfast.  ? zinc gluconate 50 MG tablet Take 50 mg by mouth daily.  ? ?No facility-administered encounter medications on file as of 01/17/2022.  ? ?Reviewed the patients visit reinsured it was completed per the pharmacist Delano Metz request. Printed the CCM care plan. Mailed the patient CCM care plan and patient handout to their most recent address on file. ? ?Time: 5 min ? ?Hulen Luster, RMA ?Healthcare Concierge  ?[phone] ? ?

## 2022-02-27 ENCOUNTER — Other Ambulatory Visit: Payer: Self-pay | Admitting: Internal Medicine

## 2022-03-07 DIAGNOSIS — E119 Type 2 diabetes mellitus without complications: Secondary | ICD-10-CM | POA: Diagnosis not present

## 2022-03-15 DIAGNOSIS — E119 Type 2 diabetes mellitus without complications: Secondary | ICD-10-CM | POA: Diagnosis not present

## 2022-03-15 DIAGNOSIS — M79675 Pain in left toe(s): Secondary | ICD-10-CM | POA: Diagnosis not present

## 2022-03-15 DIAGNOSIS — M79674 Pain in right toe(s): Secondary | ICD-10-CM | POA: Diagnosis not present

## 2022-03-15 DIAGNOSIS — B351 Tinea unguium: Secondary | ICD-10-CM | POA: Diagnosis not present

## 2022-03-23 DIAGNOSIS — E063 Autoimmune thyroiditis: Secondary | ICD-10-CM | POA: Diagnosis not present

## 2022-03-23 DIAGNOSIS — E049 Nontoxic goiter, unspecified: Secondary | ICD-10-CM | POA: Diagnosis not present

## 2022-03-23 DIAGNOSIS — E0520 Thyrotoxicosis with toxic multinodular goiter without thyrotoxic crisis or storm: Secondary | ICD-10-CM | POA: Diagnosis not present

## 2022-03-23 DIAGNOSIS — E890 Postprocedural hypothyroidism: Secondary | ICD-10-CM | POA: Diagnosis not present

## 2022-03-23 DIAGNOSIS — I10 Essential (primary) hypertension: Secondary | ICD-10-CM | POA: Diagnosis not present

## 2022-03-23 DIAGNOSIS — E1165 Type 2 diabetes mellitus with hyperglycemia: Secondary | ICD-10-CM | POA: Diagnosis not present

## 2022-03-23 DIAGNOSIS — E785 Hyperlipidemia, unspecified: Secondary | ICD-10-CM | POA: Diagnosis not present

## 2022-03-24 ENCOUNTER — Encounter: Payer: Self-pay | Admitting: Nurse Practitioner

## 2022-03-24 ENCOUNTER — Ambulatory Visit (INDEPENDENT_AMBULATORY_CARE_PROVIDER_SITE_OTHER): Payer: Medicare HMO | Admitting: Nurse Practitioner

## 2022-03-24 VITALS — BP 140/65 | HR 80 | Temp 98.0°F | Resp 16 | Wt 252.8 lb

## 2022-03-24 DIAGNOSIS — I10 Essential (primary) hypertension: Secondary | ICD-10-CM

## 2022-03-24 DIAGNOSIS — E1165 Type 2 diabetes mellitus with hyperglycemia: Secondary | ICD-10-CM | POA: Diagnosis not present

## 2022-03-24 DIAGNOSIS — M8580 Other specified disorders of bone density and structure, unspecified site: Secondary | ICD-10-CM

## 2022-03-24 DIAGNOSIS — Z794 Long term (current) use of insulin: Secondary | ICD-10-CM

## 2022-03-24 DIAGNOSIS — E039 Hypothyroidism, unspecified: Secondary | ICD-10-CM

## 2022-03-24 LAB — POCT GLYCOSYLATED HEMOGLOBIN (HGB A1C): Hemoglobin A1C: 7 % — AB (ref 4.0–5.6)

## 2022-03-24 MED ORDER — HYDROCHLOROTHIAZIDE 25 MG PO TABS: 25.0000 mg | ORAL_TABLET | Freq: Every day | ORAL | 2 refills | Status: DC

## 2022-03-24 NOTE — Progress Notes (Signed)
[facility] [address] [place], [place] [address]  Internal MEDICINE  Office Visit Note  Patient Name: [name]  [id number]  [id number]  Date of Service: 03/24/2022  Chief Complaint  Patient presents with   Follow-up    Discuss dexa and meds   Diabetes   Gastroesophageal Reflux   Hyperlipidemia   Hypertension   Quality Metric Gaps    Discuss shingrix and pneumovax    HPI [name] presents for follow-up visit for diabetes, hypertension and hyperlipidemia.  His A1c is 7.0 today which is improved from 7.4 in February earlier this year.  He reports his glucose levels have been pretty good. Patient was unsure about continuing carvedilol, reassured patient that he needs to continue this medication. He gets labs done at Dr. Jerline Pain office but I am unable to see those labs so the patient was instructed to have their office fax the lab results and visit notes to our office. Blood pressure is stable with current medications. [facility] only [facility].   Current Medication: Outpatient Encounter Medications as of 03/24/2022  Medication Sig   albuterol (VENTOLIN HFA) 108 (90 Base) MCG/ACT inhaler Inhale 2 puffs into the lungs every 6 (six) hours as needed for wheezing or shortness of breath.   aspirin EC 81 MG tablet Take 81 mg by mouth daily.   atorvastatin (LIPITOR) 40 MG tablet TAKE 1 TABLET BY MOUTH EVERY DAY   benazepril (LOTENSIN) 40 MG tablet Take 1 tablet (40 mg total) by mouth daily.   carvedilol (COREG) 6.25 MG tablet TAKE ONE TABLET BY MOUTH TWO TIMES A DAY HYPERTENSION   Cholecalciferol 1000 units capsule Take 1,000 Units by mouth daily.    Cinnamon 500 MG TABS Take 1,000 mg by mouth daily.   Glucosamine-Chondroitin 500-400 MG CAPS Take 3 tablets by mouth daily.   HUMULIN R 100 UNIT/ML injection Inject 0.15-0.17 mLs (15-17 Units total) into the skin 2 (two) times daily before a meal. And per sliding  scale Pt is not to get novolin at all (Patient taking differently: Inject 15-17 Units into the skin 2 (two) times daily before a meal. 16 units in the morning and per sliding scale Pt is not to get novolin at all)   insulin glargine (LANTUS SOLOSTAR) 100 UNIT/ML Solostar Pen Inject 60 Units into the skin daily at 10 pm.   insulin regular (HUMULIN R) 100 units/mL injection USE 17 UNITS SUBCUTANEOUSLY TWICE DAILY WITH MEALS AND USE A SLIDING SCALE IF SUGARS GET HIGH   Korean Ginseng 1000 MG TABS Take 1,000 mg by mouth daily.   Omega 3 1200 MG CAPS Take 1,200 mg by mouth daily.   ONE TOUCH ULTRA TEST test strip CHECK SUGAR 5 X A DAY FOR DX 250.3 & FLUCTUATING SUGAR   pantoprazole (PROTONIX) 40 MG tablet TAKE 1 TABLET BY MOUTH EVERY DAY   pyridOXINE (VITAMIN B-6) 100 MG tablet Take 100 mg by mouth daily.   thyroid (ARMOUR) 120 MG tablet Take 120 mg by mouth daily before breakfast.   zinc gluconate 50 MG tablet Take 50 mg by mouth daily.   [DISCONTINUED] hydrochlorothiazide (HYDRODIURIL) 25 MG tablet TAKE 1 TABLET BY MOUTH EVERY DAY IN THE MORNING   hydrochlorothiazide (HYDRODIURIL) 25 MG tablet Take 1 tablet (25 mg total) by mouth daily.   No facility-administered encounter medications on file as of 03/24/2022.    Surgical History: Past Surgical History:  Procedure Laterality Date  CARPAL TUNNEL RELEASE Right 1980's   CARPAL TUNNEL RELEASE Left 05/05/2016   Procedure: CARPAL TUNNEL RELEASE;  Surgeon: Kennedy BuckerMichael Menz, MD;  Location: [facility];  Service: Orthopedics;  Laterality: Left;   CATARACT EXTRACTION W/PHACO Left 05/12/2015   Procedure: CATARACT EXTRACTION PHACO AND INTRAOCULAR LENS PLACEMENT (IOC);  Surgeon: Galen ManilaWilliam Porfilio, MD;  Location: [facility];  Service: Ophthalmology;  Laterality: Left;  US 01:53.1AP% 21.4CDE 24.8030fluid pack lot # [id number]   CATARACT EXTRACTION W/PHACO Right 07/02/2019   Procedure: CATARACT EXTRACTION PHACO AND INTRAOCULAR LENS PLACEMENT (IOC) RIGHT DIABETIC  01:53.2   18.9%  21.43;  Surgeon: Galen ManilaPorfilio, William, MD;  [facility];  Service: Ophthalmology;  Laterality: Right;  Diabetic - insulin   EYE SURGERY Left    cataract extractions   INCISION AND DRAINAGE OF WOUND Left 1988   "staph infection" left leg   INTRAMEDULLARY (IM) NAIL INTERTROCHANTERIC Right 01/22/2018   Procedure: INTRAMEDULLARY (IM) NAIL INTERTROCHANTRIC;  Surgeon: Juanell FairlyKrasinski, Kevin, MD;  Location: [facility];  Service: Orthopedics;  Laterality: Right;   KNEE ARTHROPLASTY Left 12/28/2015   Procedure: COMPUTER ASSISTED TOTAL KNEE ARTHROPLASTY;  Surgeon: Donato HeinzJames P Hooten, MD;  Location: [facility];  Service: Orthopedics;  Laterality: Left;   ORIF WRIST FRACTURE Left 05/05/2016   Procedure: OPEN REDUCTION INTERNAL FIXATION (ORIF) WRIST FRACTURE;  Surgeon: Kennedy BuckerMichael Menz, MD;  Location: [facility];  Service: Orthopedics;  Laterality: Left;    Medical History: Past Medical History:  Diagnosis Date   Agent orange exposure    [place] war exposure    Arthritis    "left knee" (03/18/2014)   Closed pelvic fracture (HCC) 03/18/2014   "multiple S/P fall w/loss of consciousness; CBG 29"   Encephalopathy acute 08/16/2013   GERD (gastroesophageal reflux disease)    High cholesterol    History of use of hearing aid in both ears    does not currently wear   Hypertension    Hypothyroidism    Osteopenia    Type II diabetes mellitus (HCC)     Family History: Family History  Problem Relation Age of Onset   Colon cancer Mother    Cerebral aneurysm Father    Prostate cancer Neg Hx    Bladder Cancer Neg Hx    Kidney cancer Neg Hx     Social History   Socioeconomic History   Marital status: Married    Spouse name: Not on file   Number of children: Not on file   Years of education: Not on file   Highest education level: Not on file  Occupational History   Not on file  Tobacco Use   Smoking status: Former    Packs/day: 3.00    Years: 30.00    Pack years: 90.00    Types: Cigarettes     Quit date: 681988    Years since quitting: 35.4   Smokeless tobacco: Never   Tobacco comments:    "quit smoking in 1988"  Vaping Use   Vaping Use: Never used  Substance and Sexual Activity   Alcohol use: No   Drug use: No   Sexual activity: Never  Other Topics Concern   Not on file  Social History Narrative   Not on file   Social Determinants of Health   Financial Resource Strain: Not on file  Food Insecurity: Not on file  Transportation Needs: Not on file  Physical Activity: Not on file  Stress: Not on file  Social Connections: Not on file  Intimate Partner Violence: Not on file  Review of Systems  Constitutional:  Negative for chills, fatigue and unexpected weight change.  HENT:  Negative for congestion, rhinorrhea, sneezing and sore throat.   Eyes:  Negative for redness.  Respiratory:  Negative for cough, chest tightness and shortness of breath.   Cardiovascular:  Negative for chest pain and palpitations.  Gastrointestinal:  Negative for abdominal pain, constipation, diarrhea, nausea and vomiting.  Genitourinary:  Negative for dysuria and frequency.  Musculoskeletal:  Negative for arthralgias, back pain, joint swelling and neck pain.  Skin:  Negative for rash.  Neurological: Negative.  Negative for tremors and numbness.  Hematological:  Negative for adenopathy. Does not bruise/bleed easily.  Psychiatric/Behavioral:  Negative for behavioral problems (Depression), sleep disturbance and suicidal ideas. The patient is not nervous/anxious.     Vital Signs: BP (!) 153/70   Pulse 80   Temp 98 F (36.7 C)   Resp 16   Wt 252 lb 12.8 oz (114.7 kg)   SpO2 96%   BMI 35.26 kg/m    Physical Exam Constitutional:      General: He is not in acute distress.    Appearance: Normal appearance. He is obese. He is not ill-appearing.  HENT:     Head: Normocephalic and atraumatic.  Eyes:     Pupils: Pupils are equal, round, and reactive to light.  Cardiovascular:      Rate and Rhythm: Normal rate and regular rhythm.     Heart sounds: Normal heart sounds. No murmur heard. Pulmonary:     Effort: Pulmonary effort is normal. No respiratory distress.     Breath sounds: Normal breath sounds. No wheezing.  Neurological:     Mental Status: He is alert and oriented to person, place, and time.  Psychiatric:        Mood and Affect: Mood normal.        Behavior: Behavior normal.        Assessment/Plan: 1. Type 2 diabetes mellitus with hyperglycemia, with long-term current use of insulin (HCC) A1c continues to improve and is 7.0 today.  No changes in his medication doses made today - POCT HgB A1C  2. Essential hypertension Refills of hydrochlorothiazide sent to the pharmacy and patient instructed to continue carvedilol.  Blood pressure is stable with current medications - carvedilol (COREG) 6.25 MG tablet; TAKE ONE TABLET BY MOUTH TWO TIMES A DAY HYPERTENSION - hydrochlorothiazide (HYDRODIURIL) 25 MG tablet; Take 1 tablet (25 mg total) by mouth daily.  Dispense: 90 tablet; Refill: 2  3. Acquired hypothyroidism Managed by endocrinology  4. Osteopenia, unspecified location Encourage patient to make sure that he is taking a calcium and vitamin D supplement every day   General Counseling: Joaquim Nam understanding of the findings of todays visit and agrees with plan of treatment. I have discussed any further diagnostic evaluation that may be needed or ordered today. We also reviewed his medications today. he has been encouraged to call the office with any questions or concerns that should arise related to todays visit.    Orders Placed This Encounter  Procedures   POCT HgB A1C    Meds ordered this encounter  Medications   hydrochlorothiazide (HYDRODIURIL) 25 MG tablet    Sig: Take 1 tablet (25 mg total) by mouth daily.    Dispense:  90 tablet    Refill:  2    Return in 3 months (on 06/24/2022) for previously scheduled, CPE, Mickle Campton  PCP.   Total time spent: 30 minutes Time spent includes review of chart, medications,  test results, and follow up plan with the patient.   [place] Controlled Substance Database was reviewed by me.  This patient was seen by Jonetta Osgood, FNP-C in collaboration with Dr. Clayborn Bigness as a part of collaborative care agreement.   Kirsta Probert R. Valetta Fuller, MSN, FNP-C Internal medicine

## 2022-03-29 DIAGNOSIS — E063 Autoimmune thyroiditis: Secondary | ICD-10-CM | POA: Diagnosis not present

## 2022-03-29 DIAGNOSIS — E049 Nontoxic goiter, unspecified: Secondary | ICD-10-CM | POA: Diagnosis not present

## 2022-03-29 DIAGNOSIS — E1165 Type 2 diabetes mellitus with hyperglycemia: Secondary | ICD-10-CM | POA: Diagnosis not present

## 2022-03-29 DIAGNOSIS — I10 Essential (primary) hypertension: Secondary | ICD-10-CM | POA: Diagnosis not present

## 2022-03-29 DIAGNOSIS — E6609 Other obesity due to excess calories: Secondary | ICD-10-CM | POA: Diagnosis not present

## 2022-03-29 DIAGNOSIS — Z6836 Body mass index (BMI) 36.0-36.9, adult: Secondary | ICD-10-CM | POA: Diagnosis not present

## 2022-03-29 DIAGNOSIS — E0520 Thyrotoxicosis with toxic multinodular goiter without thyrotoxic crisis or storm: Secondary | ICD-10-CM | POA: Diagnosis not present

## 2022-03-29 DIAGNOSIS — E785 Hyperlipidemia, unspecified: Secondary | ICD-10-CM | POA: Diagnosis not present

## 2022-04-05 ENCOUNTER — Encounter: Payer: Self-pay | Admitting: Nurse Practitioner

## 2022-04-22 ENCOUNTER — Telehealth: Payer: Self-pay

## 2022-04-22 NOTE — Telephone Encounter (Signed)
Patient dropped off paperwork for dmv...put on alyssa 's desk.tat  *pt advised that we will call when it's ready*

## 2022-04-27 ENCOUNTER — Ambulatory Visit: Payer: Medicare HMO

## 2022-05-02 ENCOUNTER — Telehealth: Payer: Self-pay

## 2022-05-02 NOTE — Telephone Encounter (Signed)
Dmv paperwork completed and faxed back to the [facility] at [phone]. Patient advised ready to be picked up.

## 2022-05-04 DIAGNOSIS — H524 Presbyopia: Secondary | ICD-10-CM | POA: Diagnosis not present

## 2022-05-12 ENCOUNTER — Encounter: Payer: Self-pay | Admitting: Nurse Practitioner

## 2022-06-17 DIAGNOSIS — E119 Type 2 diabetes mellitus without complications: Secondary | ICD-10-CM | POA: Diagnosis not present

## 2022-06-17 DIAGNOSIS — B351 Tinea unguium: Secondary | ICD-10-CM | POA: Diagnosis not present

## 2022-06-22 ENCOUNTER — Other Ambulatory Visit: Payer: Self-pay | Admitting: Nurse Practitioner

## 2022-06-22 DIAGNOSIS — I10 Essential (primary) hypertension: Secondary | ICD-10-CM | POA: Diagnosis not present

## 2022-06-22 DIAGNOSIS — E063 Autoimmune thyroiditis: Secondary | ICD-10-CM | POA: Diagnosis not present

## 2022-06-22 DIAGNOSIS — E1165 Type 2 diabetes mellitus with hyperglycemia: Secondary | ICD-10-CM | POA: Diagnosis not present

## 2022-06-22 DIAGNOSIS — E785 Hyperlipidemia, unspecified: Secondary | ICD-10-CM | POA: Diagnosis not present

## 2022-06-22 NOTE — Progress Notes (Signed)
Review cbc, might need to see hematology

## 2022-06-24 ENCOUNTER — Ambulatory Visit (INDEPENDENT_AMBULATORY_CARE_PROVIDER_SITE_OTHER): Payer: Medicare HMO | Admitting: Nurse Practitioner

## 2022-06-24 ENCOUNTER — Other Ambulatory Visit: Payer: Self-pay | Admitting: Nurse Practitioner

## 2022-06-24 ENCOUNTER — Telehealth: Payer: Self-pay

## 2022-06-24 ENCOUNTER — Encounter: Payer: Self-pay | Admitting: Nurse Practitioner

## 2022-06-24 VITALS — BP 130/70 | HR 88 | Temp 98.5°F | Resp 16 | Ht 71.0 in | Wt 254.2 lb

## 2022-06-24 DIAGNOSIS — R300 Dysuria: Secondary | ICD-10-CM

## 2022-06-24 DIAGNOSIS — E1165 Type 2 diabetes mellitus with hyperglycemia: Secondary | ICD-10-CM | POA: Diagnosis not present

## 2022-06-24 DIAGNOSIS — E119 Type 2 diabetes mellitus without complications: Secondary | ICD-10-CM

## 2022-06-24 DIAGNOSIS — Z0001 Encounter for general adult medical examination with abnormal findings: Secondary | ICD-10-CM

## 2022-06-24 DIAGNOSIS — Z760 Encounter for issue of repeat prescription: Secondary | ICD-10-CM | POA: Diagnosis not present

## 2022-06-24 DIAGNOSIS — Z794 Long term (current) use of insulin: Secondary | ICD-10-CM | POA: Diagnosis not present

## 2022-06-24 LAB — POCT GLYCOSYLATED HEMOGLOBIN (HGB A1C): Hemoglobin A1C: 6.8 % — AB (ref 4.0–5.6)

## 2022-06-24 MED ORDER — HUMULIN R 100 UNIT/ML IJ SOLN: 15.0000 [IU] | Freq: Two times a day (BID) | INTRAMUSCULAR | 1 refills | Status: DC

## 2022-06-24 MED ORDER — BENAZEPRIL HCL 40 MG PO TABS: 40.0000 mg | ORAL_TABLET | Freq: Every day | ORAL | 2 refills | Status: DC

## 2022-06-24 MED ORDER — HUMULIN R 100 UNIT/ML IJ SOLN: 15.0000 [IU] | Freq: Two times a day (BID) | INTRAMUSCULAR | 2 refills | Status: DC

## 2022-06-24 NOTE — Telephone Encounter (Signed)
PA for HUMULIN R sent 06/24/22 @ 124 pm Came back approved from 12/01/21 to 10/30/22 Sent new rx with approval info to pharmacy

## 2022-06-24 NOTE — Progress Notes (Signed)
[facility] [address] [place], [place] [address]  Internal MEDICINE  Office Visit Note  Patient Name: [name]  [id number]  [id number]  Date of Service: 06/24/2022  Chief Complaint  Patient presents with   Medicare Wellness   Diabetes   Gastroesophageal Reflux   Hyperlipidemia   Hypertension   Quality Metric Gaps    Pneumonia Vaccine    HPI [name] presents for an annual well visit and physical exam.  Well-appearing 81 year old male with hypertension, GERD, hypothyroidism, diabetes, CKD stage 3,  --A1C 6.8, improved by 0.2 since prior.  --reviewed labs that were drawn at Dr. Jerline Pain office.  --baseline -- very hard of hearing but alert and oriented.  BP and vital signs are good.  Adherent to his medications per patient report.  No new or worsening pain No preventive screenings due   Current Medication: Outpatient Encounter Medications as of 06/24/2022  Medication Sig   albuterol (VENTOLIN HFA) 108 (90 Base) MCG/ACT inhaler Inhale 2 puffs into the lungs every 6 (six) hours as needed for wheezing or shortness of breath.   aspirin EC 81 MG tablet Take 81 mg by mouth daily.   atorvastatin (LIPITOR) 40 MG tablet TAKE 1 TABLET BY MOUTH EVERY DAY   carvedilol (COREG) 6.25 MG tablet TAKE ONE TABLET BY MOUTH TWO TIMES A DAY HYPERTENSION   Cholecalciferol 1000 units capsule Take 1,000 Units by mouth daily.    Cinnamon 500 MG TABS Take 1,000 mg by mouth daily.   Glucosamine-Chondroitin 500-400 MG CAPS Take 3 tablets by mouth daily.   hydrochlorothiazide (HYDRODIURIL) 25 MG tablet Take 1 tablet (25 mg total) by mouth daily.   insulin glargine-yfgn (SEMGLEE) 100 UNIT/ML Pen INJECT 57 UNITS UNDER SKIN EVERY DAY DISCARD PEN 28 DAYS AFTER OPENING.   insulin regular (HUMULIN R) 100 units/mL injection USE 17 UNITS SUBCUTANEOUSLY TWICE DAILY WITH MEALS AND USE A SLIDING SCALE IF SUGARS GET HIGH   [place] Ginseng 1000 MG TABS Take 1,000 mg by mouth daily.   Omega 3  1200 MG CAPS Take 1,200 mg by mouth daily.   ONE TOUCH ULTRA TEST test strip CHECK SUGAR 5 X A DAY FOR DX 250.3 & FLUCTUATING SUGAR   pyridOXINE (VITAMIN B-6) 100 MG tablet Take 100 mg by mouth daily.   zinc gluconate 50 MG tablet Take 50 mg by mouth daily.   [DISCONTINUED] benazepril (LOTENSIN) 40 MG tablet Take 1 tablet (40 mg total) by mouth daily.   [DISCONTINUED] HUMULIN R 100 UNIT/ML injection Inject 0.15-0.17 mLs (15-17 Units total) into the skin 2 (two) times daily before a meal. And per sliding scale Pt is not to get novolin at all (Patient taking differently: Inject 15-17 Units into the skin 2 (two) times daily before a meal. 16 units in the morning and per sliding scale Pt is not to get novolin at all)   [DISCONTINUED] insulin glargine (LANTUS SOLOSTAR) 100 UNIT/ML Solostar Pen Inject 60 Units into the skin daily at 10 pm.   [DISCONTINUED] pantoprazole (PROTONIX) 40 MG tablet TAKE 1 TABLET BY MOUTH EVERY DAY   [DISCONTINUED] thyroid (ARMOUR) 120 MG tablet Take 120 mg by mouth daily before breakfast.   ARMOUR THYROID 90 MG tablet Take 90 mg by mouth daily.   benazepril (LOTENSIN) 40 MG tablet Take 1 tablet (40 mg total) by mouth daily.   HUMULIN R 100 UNIT/ML injection Inject 0.15-0.17 mLs (15-17 Units total) into the skin 2 (two) times daily before a meal. And 16 units  in the morning and per sliding scale   No facility-administered encounter medications on file as of 06/24/2022.    Surgical History: Past Surgical History:  Procedure Laterality Date   CARPAL TUNNEL RELEASE Right 1980's   CARPAL TUNNEL RELEASE Left 05/05/2016   Procedure: CARPAL TUNNEL RELEASE;  Surgeon: Kennedy Bucker, MD;  Location: [facility];  Service: Orthopedics;  Laterality: Left;   CATARACT EXTRACTION W/PHACO Left 05/12/2015   Procedure: CATARACT EXTRACTION PHACO AND INTRAOCULAR LENS PLACEMENT (IOC);  Surgeon: Galen Manila, MD;  Location: [facility];  Service: Ophthalmology;  Laterality: Left;  [place] 01:53.1AP%  21.4CDE 24.14fluid pack lot # [id number]   CATARACT EXTRACTION W/PHACO Right 07/02/2019   Procedure: CATARACT EXTRACTION PHACO AND INTRAOCULAR LENS PLACEMENT (IOC) RIGHT DIABETIC  01:53.2  18.9%  21.43;  Surgeon: Galen Manila, MD;  Location: [facility];  Service: Ophthalmology;  Laterality: Right;  Diabetic - insulin   EYE SURGERY Left    cataract extractions   INCISION AND DRAINAGE OF WOUND Left 1988   "staph infection" left leg   INTRAMEDULLARY (IM) NAIL INTERTROCHANTERIC Right 01/22/2018   Procedure: INTRAMEDULLARY (IM) NAIL INTERTROCHANTRIC;  Surgeon: Juanell Fairly, MD;  Location: [facility];  Service: Orthopedics;  Laterality: Right;   KNEE ARTHROPLASTY Left 12/28/2015   Procedure: COMPUTER ASSISTED TOTAL KNEE ARTHROPLASTY;  Surgeon: Donato Heinz, MD;  Location: [facility];  Service: Orthopedics;  Laterality: Left;   ORIF WRIST FRACTURE Left 05/05/2016   Procedure: OPEN REDUCTION INTERNAL FIXATION (ORIF) WRIST FRACTURE;  Surgeon: Kennedy Bucker, MD;  Location: [facility];  Service: Orthopedics;  Laterality: Left;    Medical History: Past Medical History:  Diagnosis Date   Agent orange exposure    [place] war exposure    Arthritis    "left knee" (03/18/2014)   Closed pelvic fracture (HCC) 03/18/2014   "multiple S/P fall w/loss of consciousness; CBG 29"   Encephalopathy acute 08/16/2013   GERD (gastroesophageal reflux disease)    High cholesterol    History of use of hearing aid in both ears    does not currently wear   Hypertension    Hypothyroidism    Osteopenia    Type II diabetes mellitus (HCC)     Family History: Family History  Problem Relation Age of Onset   Colon cancer Mother    Cerebral aneurysm Father    Prostate cancer Neg Hx    Bladder Cancer Neg Hx    Kidney cancer Neg Hx     Social History   Socioeconomic History   Marital status: Married    Spouse name: Not on file   Number of children: Not on file   Years of education: Not on file   Highest  education level: Not on file  Occupational History   Not on file  Tobacco Use   Smoking status: Former    Packs/day: 3.00    Years: 30.00    Total pack years: 90.00    Types: Cigarettes    Quit date: 47    Years since quitting: 35.6   Smokeless tobacco: Never   Tobacco comments:    "quit smoking in 1988"  Vaping Use   Vaping Use: Never used  Substance and Sexual Activity   Alcohol use: No   Drug use: No   Sexual activity: Never  Other Topics Concern   Not on file  Social History Narrative   Not on file   Social Determinants of Health   Financial Resource Strain: Not on file  Food Insecurity: Not  on file  Transportation Needs: Not on file  Physical Activity: Not on file  Stress: Not on file  Social Connections: Not on file  Intimate Partner Violence: Not on file      Review of Systems  Constitutional:  Negative for activity change, appetite change, chills, fatigue, fever and unexpected weight change.  HENT: Negative.  Negative for congestion, ear pain, rhinorrhea, sneezing, sore throat and trouble swallowing.   Eyes: Negative.  Negative for redness.  Respiratory: Negative.  Negative for cough, chest tightness, shortness of breath and wheezing.   Cardiovascular: Negative.  Negative for chest pain and palpitations.  Gastrointestinal: Negative.  Negative for abdominal pain, blood in stool, constipation, diarrhea, nausea and vomiting.  Endocrine: Negative.   Genitourinary: Negative.  Negative for difficulty urinating, dysuria, frequency, hematuria and urgency.  Musculoskeletal: Negative.  Negative for arthralgias, back pain, joint swelling, myalgias and neck pain.  Skin:  Negative for rash and wound.  Allergic/Immunologic: Negative.  Negative for immunocompromised state.  Neurological: Negative.  Negative for dizziness, tremors, seizures, numbness and headaches.  Hematological: Negative.  Negative for adenopathy. Does not bruise/bleed easily.  Psychiatric/Behavioral:  Negative.  Negative for agitation, behavioral problems (Depression), self-injury, sleep disturbance and suicidal ideas. The patient is not nervous/anxious.     Vital Signs: BP 130/70 Comment: 167/82  Pulse 88   Temp 98.5 F (36.9 C)   Resp 16   Ht 5\' 11"  (1.803 m)   Wt 254 lb 3.2 oz (115.3 kg)   SpO2 93%   BMI 35.45 kg/m    Physical Exam Vitals reviewed.  Constitutional:      General: He is not in acute distress.    Appearance: Normal appearance. He is well-developed. He is obese. He is not ill-appearing or diaphoretic.  HENT:     Head: Normocephalic and atraumatic.     Right Ear: Tympanic membrane, ear canal and external ear normal.     Left Ear: Tympanic membrane, ear canal and external ear normal.     Nose: Nose normal. No congestion or rhinorrhea.     Mouth/Throat:     Mouth: Mucous membranes are moist.     Pharynx: Oropharynx is clear. No oropharyngeal exudate or posterior oropharyngeal erythema.  Eyes:     General: No scleral icterus.       Right eye: No discharge.        Left eye: No discharge.     Extraocular Movements: Extraocular movements intact.     Conjunctiva/sclera: Conjunctivae normal.     Pupils: Pupils are equal, round, and reactive to light.  Neck:     Thyroid: No thyromegaly.     Vascular: No carotid bruit or JVD.     Trachea: No tracheal deviation.  Cardiovascular:     Rate and Rhythm: Normal rate and regular rhythm.     Heart sounds: Normal heart sounds. No murmur heard.    No friction rub. No gallop.  Pulmonary:     Effort: Pulmonary effort is normal. No respiratory distress.     Breath sounds: Normal breath sounds. No stridor. No wheezing or rales.  Chest:     Chest wall: No tenderness.  Abdominal:     General: Bowel sounds are normal. There is no distension.     Palpations: Abdomen is soft. There is no mass.     Tenderness: There is no abdominal tenderness. There is no guarding or rebound.  Musculoskeletal:        General: No tenderness  or deformity. Normal range  of motion.     Cervical back: Normal range of motion and neck supple.  Lymphadenopathy:     Cervical: No cervical adenopathy.  Skin:    General: Skin is warm and dry.     Capillary Refill: Capillary refill takes less than 2 seconds.     Coloration: Skin is not pale.  Neurological:     Mental Status: He is alert and oriented to person, place, and time.     Cranial Nerves: No cranial nerve deficit.     Motor: No abnormal muscle tone.     Coordination: Coordination normal.     Gait: Gait normal.     Deep Tendon Reflexes: Reflexes are normal and symmetric.  Psychiatric:        Mood and Affect: Mood normal.        Behavior: Behavior normal.        Thought Content: Thought content normal.        Judgment: Judgment normal.        Assessment/Plan: 1. Encounter for general adult medical examination with abnormal findings Age-appropriate preventive screenings and vaccinations discussed, annual physical exam completed. Routine lab results reviewed at today's visit. PHM updated.   2. Type 2 diabetes mellitus with hyperglycemia, with long-term current use of insulin (HCC) Stable, a1c improving, continue medications as prescribed.  - POCT HgB A1C - insulin glargine-yfgn (SEMGLEE) 100 UNIT/ML Pen; INJECT 57 UNITS UNDER SKIN EVERY DAY DISCARD PEN 28 DAYS AFTER OPENING.  3. Dysuria Routine urinalysis done - UA/M w/rflx Culture, Routine - Microscopic Examination  4. Medication refill - insulin glargine-yfgn (SEMGLEE) 100 UNIT/ML Pen; INJECT 57 UNITS UNDER SKIN EVERY DAY DISCARD PEN 28 DAYS AFTER OPENING. - ARMOUR THYROID 90 MG tablet; Take 90 mg by mouth daily. - benazepril (LOTENSIN) 40 MG tablet; Take 1 tablet (40 mg total) by mouth daily.  Dispense: 90 tablet; Refill: 2      General Counseling: [name] understanding of the findings of todays visit and agrees with plan of treatment. I have discussed any further diagnostic evaluation that may be  needed or ordered today. We also reviewed his medications today. he has been encouraged to call the office with any questions or concerns that should arise related to todays visit.    Orders Placed This Encounter  Procedures   POCT HgB A1C    Meds ordered this encounter  Medications   HUMULIN R 100 UNIT/ML injection    Sig: Inject 0.15-0.17 mLs (15-17 Units total) into the skin 2 (two) times daily before a meal. And 16 units in the morning and per sliding scale    Dispense:  10 mL    Refill:  1    Pt needs 10 ml vial only x1. He Is allergic to Novolin products, do not substitute with novolin   benazepril (LOTENSIN) 40 MG tablet    Sig: Take 1 tablet (40 mg total) by mouth daily.    Dispense:  90 tablet    Refill:  2    Return in about 3 months (around 09/24/2022) for F/U, BP check, [name].   Total time spent:30 Minutes Time spent includes review of chart, medications, test results, and follow up plan with the patient.   [place] Controlled Substance Database was reviewed by me.  This patient was seen by Sallyanne Kuster, FNP-C in collaboration with Dr. Beverely Risen as a part of collaborative care agreement.  Avabella Wailes R. Tedd Sias, MSN, FNP-C Internal medicine

## 2022-06-25 LAB — MICROSCOPIC EXAMINATION
Bacteria, UA: NONE SEEN
Casts: NONE SEEN /lpf
Epithelial Cells (non renal): NONE SEEN /hpf (ref 0–10)
RBC, Urine: NONE SEEN /hpf (ref 0–2)
WBC, UA: NONE SEEN /hpf (ref 0–5)

## 2022-06-25 LAB — UA/M W/RFLX CULTURE, ROUTINE
Bilirubin, UA: NEGATIVE
Glucose, UA: NEGATIVE
Ketones, UA: NEGATIVE
Leukocytes,UA: NEGATIVE
Nitrite, UA: NEGATIVE
Protein,UA: NEGATIVE
RBC, UA: NEGATIVE
Specific Gravity, UA: 1.017 (ref 1.005–1.030)
Urobilinogen, Ur: 0.2 mg/dL (ref 0.2–1.0)
pH, UA: 5.5 (ref 5.0–7.5)

## 2022-06-29 DIAGNOSIS — E6609 Other obesity due to excess calories: Secondary | ICD-10-CM | POA: Diagnosis not present

## 2022-06-29 DIAGNOSIS — E049 Nontoxic goiter, unspecified: Secondary | ICD-10-CM | POA: Diagnosis not present

## 2022-06-29 DIAGNOSIS — Z6836 Body mass index (BMI) 36.0-36.9, adult: Secondary | ICD-10-CM | POA: Diagnosis not present

## 2022-06-29 DIAGNOSIS — E890 Postprocedural hypothyroidism: Secondary | ICD-10-CM | POA: Diagnosis not present

## 2022-06-29 DIAGNOSIS — E785 Hyperlipidemia, unspecified: Secondary | ICD-10-CM | POA: Diagnosis not present

## 2022-06-29 DIAGNOSIS — E0520 Thyrotoxicosis with toxic multinodular goiter without thyrotoxic crisis or storm: Secondary | ICD-10-CM | POA: Diagnosis not present

## 2022-06-29 DIAGNOSIS — E063 Autoimmune thyroiditis: Secondary | ICD-10-CM | POA: Diagnosis not present

## 2022-06-29 DIAGNOSIS — I10 Essential (primary) hypertension: Secondary | ICD-10-CM | POA: Diagnosis not present

## 2022-08-11 ENCOUNTER — Emergency Department: Payer: Medicare HMO

## 2022-08-11 ENCOUNTER — Emergency Department
Admission: EM | Admit: 2022-08-11 | Discharge: 2022-08-11 | Disposition: A | Discharge status: Home / Self Care | Payer: Medicare HMO | Attending: Emergency Medicine | Admitting: Emergency Medicine

## 2022-08-11 ENCOUNTER — Other Ambulatory Visit: Payer: Self-pay

## 2022-08-11 DIAGNOSIS — M4319 Spondylolisthesis, multiple sites in spine: Secondary | ICD-10-CM | POA: Diagnosis not present

## 2022-08-11 DIAGNOSIS — I2510 Atherosclerotic heart disease of native coronary artery without angina pectoris: Secondary | ICD-10-CM | POA: Diagnosis not present

## 2022-08-11 DIAGNOSIS — M47816 Spondylosis without myelopathy or radiculopathy, lumbar region: Secondary | ICD-10-CM | POA: Insufficient documentation

## 2022-08-11 DIAGNOSIS — S0993XA Unspecified injury of face, initial encounter: Secondary | ICD-10-CM | POA: Diagnosis not present

## 2022-08-11 DIAGNOSIS — E039 Hypothyroidism, unspecified: Secondary | ICD-10-CM | POA: Diagnosis not present

## 2022-08-11 DIAGNOSIS — I10 Essential (primary) hypertension: Secondary | ICD-10-CM | POA: Diagnosis not present

## 2022-08-11 DIAGNOSIS — K5730 Diverticulosis of large intestine without perforation or abscess without bleeding: Secondary | ICD-10-CM | POA: Diagnosis not present

## 2022-08-11 DIAGNOSIS — E119 Type 2 diabetes mellitus without complications: Secondary | ICD-10-CM | POA: Diagnosis not present

## 2022-08-11 DIAGNOSIS — N400 Enlarged prostate without lower urinary tract symptoms: Secondary | ICD-10-CM | POA: Insufficient documentation

## 2022-08-11 DIAGNOSIS — K449 Diaphragmatic hernia without obstruction or gangrene: Secondary | ICD-10-CM | POA: Insufficient documentation

## 2022-08-11 DIAGNOSIS — M4322 Fusion of spine, cervical region: Secondary | ICD-10-CM | POA: Diagnosis not present

## 2022-08-11 DIAGNOSIS — K760 Fatty (change of) liver, not elsewhere classified: Secondary | ICD-10-CM | POA: Insufficient documentation

## 2022-08-11 DIAGNOSIS — W01198A Fall on same level from slipping, tripping and stumbling with subsequent striking against other object, initial encounter: Secondary | ICD-10-CM | POA: Diagnosis not present

## 2022-08-11 DIAGNOSIS — R220 Localized swelling, mass and lump, head: Secondary | ICD-10-CM | POA: Diagnosis not present

## 2022-08-11 DIAGNOSIS — S0990XA Unspecified injury of head, initial encounter: Secondary | ICD-10-CM | POA: Insufficient documentation

## 2022-08-11 DIAGNOSIS — K7689 Other specified diseases of liver: Secondary | ICD-10-CM | POA: Diagnosis not present

## 2022-08-11 DIAGNOSIS — J984 Other disorders of lung: Secondary | ICD-10-CM | POA: Diagnosis not present

## 2022-08-11 DIAGNOSIS — I6523 Occlusion and stenosis of bilateral carotid arteries: Secondary | ICD-10-CM | POA: Diagnosis not present

## 2022-08-11 DIAGNOSIS — W19XXXA Unspecified fall, initial encounter: Secondary | ICD-10-CM

## 2022-08-11 DIAGNOSIS — M7989 Other specified soft tissue disorders: Secondary | ICD-10-CM | POA: Diagnosis not present

## 2022-08-11 DIAGNOSIS — I639 Cerebral infarction, unspecified: Secondary | ICD-10-CM | POA: Diagnosis not present

## 2022-08-11 DIAGNOSIS — S0181XA Laceration without foreign body of other part of head, initial encounter: Secondary | ICD-10-CM | POA: Insufficient documentation

## 2022-08-11 DIAGNOSIS — N281 Cyst of kidney, acquired: Secondary | ICD-10-CM | POA: Diagnosis not present

## 2022-08-11 LAB — CBC WITH DIFFERENTIAL/PLATELET
Abs Immature Granulocytes: 0.16 10*3/uL — ABNORMAL HIGH (ref 0.00–0.07)
Basophils Absolute: 0.1 10*3/uL (ref 0.0–0.1)
Basophils Relative: 1 %
Eosinophils Absolute: 0.1 10*3/uL (ref 0.0–0.5)
Eosinophils Relative: 1 %
HCT: 37.2 % — ABNORMAL LOW (ref 39.0–52.0)
Hemoglobin: 12.6 g/dL — ABNORMAL LOW (ref 13.0–17.0)
Immature Granulocytes: 2 %
Lymphocytes Relative: 54 %
Lymphs Abs: 4.9 10*3/uL — ABNORMAL HIGH (ref 0.7–4.0)
MCH: 34.7 pg — ABNORMAL HIGH (ref 26.0–34.0)
MCHC: 33.9 g/dL (ref 30.0–36.0)
MCV: 102.5 fL — ABNORMAL HIGH (ref 80.0–100.0)
Monocytes Absolute: 0.8 10*3/uL (ref 0.1–1.0)
Monocytes Relative: 9 %
Neutro Abs: 2.9 10*3/uL (ref 1.7–7.7)
Neutrophils Relative %: 33 %
Platelets: 159 10*3/uL (ref 150–400)
RBC: 3.63 MIL/uL — ABNORMAL LOW (ref 4.22–5.81)
RDW: 14.7 % (ref 11.5–15.5)
WBC: 8.9 10*3/uL (ref 4.0–10.5)
nRBC: 0 % (ref 0.0–0.2)

## 2022-08-11 LAB — URINALYSIS, ROUTINE W REFLEX MICROSCOPIC
Bilirubin Urine: NEGATIVE
Glucose, UA: NEGATIVE mg/dL
Hgb urine dipstick: NEGATIVE
Ketones, ur: NEGATIVE mg/dL
Leukocytes,Ua: NEGATIVE
Nitrite: NEGATIVE
Protein, ur: NEGATIVE mg/dL
Specific Gravity, Urine: 1.015 (ref 1.005–1.030)
pH: 5 (ref 5.0–8.0)

## 2022-08-11 LAB — COMPREHENSIVE METABOLIC PANEL
ALT: 56 U/L — ABNORMAL HIGH (ref 0–44)
AST: 52 U/L — ABNORMAL HIGH (ref 15–41)
Albumin: 4 g/dL (ref 3.5–5.0)
Alkaline Phosphatase: 78 U/L (ref 38–126)
Anion gap: 9 (ref 5–15)
BUN: 20 mg/dL (ref 8–23)
CO2: 27 mmol/L (ref 22–32)
Calcium: 9.1 mg/dL (ref 8.9–10.3)
Chloride: 102 mmol/L (ref 98–111)
Creatinine, Ser: 1.09 mg/dL (ref 0.61–1.24)
GFR, Estimated: >60 mL/min (ref >60)
Glucose, Bld: 182 mg/dL — ABNORMAL HIGH (ref 70–99)
Potassium: 4.6 mmol/L (ref 3.5–5.1)
Sodium: 138 mmol/L (ref 135–145)
Total Bilirubin: 1.2 mg/dL (ref 0.3–1.2)
Total Protein: 6.9 g/dL (ref 6.5–8.1)

## 2022-08-11 LAB — TROPONIN I (HIGH SENSITIVITY): Troponin I (High Sensitivity): 10 ng/L (ref <18)

## 2022-08-11 MED ORDER — IOHEXOL 300 MG/ML  SOLN
100.0000 mL | Freq: Once | INTRAMUSCULAR | Status: AC | PRN
  Administered 2022-08-11: 100 mL via INTRAVENOUS

## 2022-08-11 MED ORDER — LIDOCAINE HCL (PF) 1 % IJ SOLN
5.0000 mL | Freq: Once | INTRAMUSCULAR | Status: AC
  Administered 2022-08-11: 5 mL
  Filled 2022-08-11: qty 5

## 2022-08-11 NOTE — ED Provider Triage Note (Signed)
Emergency Medicine Provider Triage Evaluation Note  [name] , a 81 y.o. male  was evaluated in triage.  Pt complains of fall ?syncope yesterday baseline memory issues.  Review of Systems  Positive: Head injury lac on forehead and L leg Negative: Cp sob fever n/v/d  Physical Exam  BP (!) 133/94   Pulse 99   Temp 98.4 F (36.9 C) (Oral)   Resp 18   Wt 108.9 kg   SpO2 100%   BMI 33.47 kg/m  Gen:   Awake, no distress   Resp:  Normal effort  MSK:   Moves extremities without difficulty  Other:  Head lac and L leg lac bruising on front abd and chest  Medical Decision Making  Medically screening exam initiated at 12:18 PM.  Appropriate orders placed.  CORWYN VORA was informed that the remainder of the evaluation will be completed by another provider, this initial triage assessment does not replace that evaluation, and the importance of remaining in the ED until their evaluation is complete.  Fall unknown circumstances ?syncope, able to ambulate. Happened yesterday w sign of injury to head LLE and torso/abdomen. Initial w/u ordered.    Lucillie Garfinkel, MD 08/11/22 1218

## 2022-08-11 NOTE — ED Triage Notes (Signed)
Pt here from [facility] with a fall yesterday. Pt states he does not remember what happened before the fall but woke up on the floor and then called his wife. Pt has a laceration on his left side of his head and a laceration on his left leg.

## 2022-08-11 NOTE — ED Triage Notes (Addendum)
PT coming POV from home today after falling yesterday. Pt does not remember what caused the fall- Pt believe +LOC as well. Pt does not have any memory surrounding the fall- but remembers waking on the floor.   Pt arrives with a L eyebrow lac- bleeding controlled. L shin lac- covered with gauze.  PT denies blood thinners at this time. PT denies any pain at this time. Pt alert and oriented at this time.  Pt has large bruise to L chest at this time.

## 2022-08-11 NOTE — Discharge Instructions (Signed)
See your doctor in 7 days for suture removal.   Please keep your wound clean by washing at least daily with soap and water. If you see any signs of infection like spreading redness, pus coming from the wound, extreme pain, fevers, chills or any other worsening doctor right away or come back to the emergency department Take acetaminophen 650 mg every 6 hours for pain.  Take with food.  Thank you for choosing [place] for your health care today!  Please see your primary doctor this week for a follow up appointment.   If you do not have a primary doctor call the following clinics to establish care:  If you have insurance:  [facility] [phone] [place] [address]   [facility]  [phone] [address] [place] [address]   If you do not have insurance:  [facility]  [phone] [address], [place] [address]  Sometimes, in the early stages of certain disease courses it is difficult to detect in the emergency department evaluation -- so, it is important that you continue to monitor your symptoms and call your doctor right away or return to the emergency department if you develop any new or worsening symptoms.  It was my pleasure to care for you today.   Hoover Brunette Jacelyn Grip, MD

## 2022-08-11 NOTE — ED Provider Notes (Signed)
[facility] Provider Note    Event Date/Time   First MD Initiated Contact with Patient 08/11/22 1435     (approximate)   History   Fall   HPI  [name] is a 81 y.o. male   Past medical history of tension, hypothyroid, diabetes who presents to the emergency department with a fall and head strike with a laceration to his forehead.  Also left lower extremity injury.  Was able to get up and ambulate. Occurrence happened yesterday. Feels overall well today, no cp, dyspnea, abd pain, n/v.   History was obtained via patient and wife      Physical Exam   Triage Vital Signs: ED Triage Vitals [08/11/22 1211]  Enc Vitals Group     BP (!) 133/94     Pulse Rate 99     Resp 18     Temp 98.4 F (36.9 C)     Temp Source Oral     SpO2 100 %     Weight 240 lb (108.9 kg)     Height      Head Circumference      Peak Flow      Pain Score 0     Pain Loc      Pain Edu?      Excl. in GC?     Most recent vital signs: Vitals:   08/11/22 1211 08/11/22 1552  BP: (!) 133/94 130/88  Pulse: 99 90  Resp: 18 18  Temp: 98.4 F (36.9 C) 98 F (36.7 C)  SpO2: 100% 100%    General: Awake, no distress.  CV:  Good peripheral perfusion.  Resp:  Normal effort.  Abd:  No distention.  Other:  Is a laceration gaping to the forehead, no cervical spine tenderness, he does have some bruising to the chest wall and upper abdomen, nontender no rigidity.  He has a wound to the left anterior shin which is hemostatic, neurovascular intact and he is able to range all extremities with full active range of motion.   ED Results / Procedures / Treatments   Labs (all labs ordered are listed, but only abnormal results are displayed) Labs Reviewed  COMPREHENSIVE METABOLIC PANEL - Abnormal; Notable for the following components:      Result Value   Glucose, Bld 182 (*)    AST 52 (*)    ALT 56 (*)    All other components within normal limits  CBC WITH DIFFERENTIAL/PLATELET  - Abnormal; Notable for the following components:   RBC 3.63 (*)    Hemoglobin 12.6 (*)    HCT 37.2 (*)    MCV 102.5 (*)    MCH 34.7 (*)    Lymphs Abs 4.9 (*)    Abs Immature Granulocytes 0.16 (*)    All other components within normal limits  URINALYSIS, ROUTINE W REFLEX MICROSCOPIC - Abnormal; Notable for the following components:   Color, Urine YELLOW (*)    APPearance CLEAR (*)    All other components within normal limits  TROPONIN I (HIGH SENSITIVITY)  TROPONIN I (HIGH SENSITIVITY)     I reviewed labs and they are notable for Hgb 12.6  EKG  ED ECG REPORT I, Pilar Jarvis, the attending physician, personally viewed and interpreted this ECG.   Date: 08/11/2022  EKG Time: 1219  Rate: 99  Rhythm: normal sinus rhythm, occasional PVC noted, unifocal  Axis: normal  Intervals: no ischemic changes, +PVC    RADIOLOGY I independently reviewed and interpreted  XR LLE and see no obvious fx    PROCEDURES:  Critical Care performed: No  ..Laceration Repair  Date/Time: 08/11/2022 4:20 PM  Performed by: Lucillie Garfinkel, MD Authorized by: Lucillie Garfinkel, MD   Consent:    Consent obtained:  Verbal   Consent given by:  Patient   Risks discussed:  Need for additional repair, poor cosmetic result and poor wound healing   Alternatives discussed:  No treatment and delayed treatment Universal protocol:    Procedure explained and questions answered to patient or proxy's satisfaction: yes     Relevant documents present and verified: yes     Test results available: yes     Imaging studies available: yes     Patient identity confirmed:  Verbally with patient Anesthesia:    Anesthesia method:  Local infiltration   Local anesthetic:  Lidocaine 1% w/o epi Laceration details:    Location:  Face   Face location:  Forehead   Length (cm):  3   Depth (mm):  5 Pre-procedure details:    Preparation:  Imaging obtained to evaluate for foreign bodies Exploration:    Limited defect created (wound  extended): no     Hemostasis achieved with:  Direct pressure   Wound exploration: wound explored through full range of motion     Wound extent: no foreign bodies/material noted     Contaminated: no   Treatment:    Area cleansed with:  Saline   Amount of cleaning:  Standard   Irrigation solution:  Tap water   Irrigation method:  Pressure wash   Visualized foreign bodies/material removed: no     Debridement:  None   Undermining:  None   Scar revision: no   Skin repair:    Repair method:  Sutures   Suture size:  5-0   Suture material:  Nylon   Suture technique:  Simple interrupted   Number of sutures:  4 Approximation:    Approximation:  Close Repair type:    Repair type:  Simple Post-procedure details:    Dressing:  Open (no dressing)   Procedure completion:  Tolerated    MEDICATIONS ORDERED IN ED: Medications  lidocaine (PF) (XYLOCAINE) 1 % injection 5 mL (5 mLs Other Given by Other 08/11/22 1519)  iohexol (OMNIPAQUE) 300 MG/ML solution 100 mL (100 mLs Intravenous Contrast Given 08/11/22 1540)     IMPRESSION / MDM / [place] / ED COURSE  I reviewed the triage vital signs and the nursing notes.                              Differential diagnosis includes, but is not limited to, blunt traumatic injuries intracranial bleeding, leg fracture dislocation, lacerations, syncope assess for ACS arrhythmia electrolyte disturbance, less likely stroke.   MDM: Work-up largely unremarkable and traumatic imaging with incidental findings of calcaneal abnormality and T-spine abnormality the patient has no signs of trauma and no tenderness at those areas.  Laceration was repaired as above.  Patient feels well and would like to go home at this time.  Safe for discharge with return precautions and follow-up with PMD.    Patient's presentation is most consistent with acute presentation with potential threat to life or bodily function.       FINAL CLINICAL IMPRESSION(S) / ED  DIAGNOSES   Final diagnoses:  Fall, initial encounter  Injury of head, initial encounter  Forehead laceration, initial encounter     Rx /  DC Orders   ED Discharge Orders     None        Note:  This document was prepared using Dragon voice recognition software and may include unintentional dictation errors.    Pilar Jarvis, MD 08/11/22 [id number]

## 2022-08-16 ENCOUNTER — Ambulatory Visit: Payer: Medicare HMO | Admitting: Nurse Practitioner

## 2022-08-17 ENCOUNTER — Telehealth: Payer: Self-pay | Admitting: Nurse Practitioner

## 2022-08-17 NOTE — Telephone Encounter (Signed)
Patient's wife called stating had been seen in ED for a fall. Was told to follow up with pcp for stitch removal. I explained we do not do that here, but she could take him to [facility] walk-in-[facility]

## 2022-08-23 DIAGNOSIS — S0181XA Laceration without foreign body of other part of head, initial encounter: Secondary | ICD-10-CM | POA: Diagnosis not present

## 2022-08-23 DIAGNOSIS — Y92009 Unspecified place in unspecified non-institutional (private) residence as the place of occurrence of the external cause: Secondary | ICD-10-CM | POA: Diagnosis not present

## 2022-08-23 DIAGNOSIS — Z4802 Encounter for removal of sutures: Secondary | ICD-10-CM | POA: Diagnosis not present

## 2022-08-23 DIAGNOSIS — W010XXA Fall on same level from slipping, tripping and stumbling without subsequent striking against object, initial encounter: Secondary | ICD-10-CM | POA: Diagnosis not present

## 2022-08-27 ENCOUNTER — Encounter: Payer: Self-pay | Admitting: Emergency Medicine

## 2022-08-27 ENCOUNTER — Other Ambulatory Visit: Payer: Self-pay

## 2022-08-27 ENCOUNTER — Encounter: Payer: Self-pay | Admitting: Nurse Practitioner

## 2022-08-27 DIAGNOSIS — R2242 Localized swelling, mass and lump, left lower limb: Secondary | ICD-10-CM | POA: Diagnosis present

## 2022-08-27 DIAGNOSIS — L03116 Cellulitis of left lower limb: Secondary | ICD-10-CM | POA: Diagnosis not present

## 2022-08-27 DIAGNOSIS — M7989 Other specified soft tissue disorders: Secondary | ICD-10-CM | POA: Diagnosis not present

## 2022-08-27 DIAGNOSIS — E039 Hypothyroidism, unspecified: Secondary | ICD-10-CM | POA: Insufficient documentation

## 2022-08-27 DIAGNOSIS — E119 Type 2 diabetes mellitus without complications: Secondary | ICD-10-CM | POA: Insufficient documentation

## 2022-08-27 LAB — CBC WITH DIFFERENTIAL/PLATELET
Abs Immature Granulocytes: 0.09 10*3/uL — ABNORMAL HIGH (ref 0.00–0.07)
Basophils Absolute: 0 10*3/uL (ref 0.0–0.1)
Basophils Relative: 0 %
Eosinophils Absolute: 0.1 10*3/uL (ref 0.0–0.5)
Eosinophils Relative: 1 %
HCT: 36.8 % — ABNORMAL LOW (ref 39.0–52.0)
Hemoglobin: 12.5 g/dL — ABNORMAL LOW (ref 13.0–17.0)
Immature Granulocytes: 1 %
Lymphocytes Relative: 67 %
Lymphs Abs: 4.8 10*3/uL — ABNORMAL HIGH (ref 0.7–4.0)
MCH: 34.2 pg — ABNORMAL HIGH (ref 26.0–34.0)
MCHC: 34 g/dL (ref 30.0–36.0)
MCV: 100.5 fL — ABNORMAL HIGH (ref 80.0–100.0)
Monocytes Absolute: 0.5 10*3/uL (ref 0.1–1.0)
Monocytes Relative: 8 %
Neutro Abs: 1.6 10*3/uL — ABNORMAL LOW (ref 1.7–7.7)
Neutrophils Relative %: 23 %
Platelets: 174 10*3/uL (ref 150–400)
RBC: 3.66 MIL/uL — ABNORMAL LOW (ref 4.22–5.81)
RDW: 14.6 % (ref 11.5–15.5)
WBC: 7.1 10*3/uL (ref 4.0–10.5)
nRBC: 0 % (ref 0.0–0.2)

## 2022-08-27 LAB — BASIC METABOLIC PANEL
Anion gap: 8 (ref 5–15)
BUN: 14 mg/dL (ref 8–23)
CO2: 27 mmol/L (ref 22–32)
Calcium: 8.9 mg/dL (ref 8.9–10.3)
Chloride: 103 mmol/L (ref 98–111)
Creatinine, Ser: 0.86 mg/dL (ref 0.61–1.24)
GFR, Estimated: >60 mL/min (ref >60)
Glucose, Bld: 234 mg/dL — ABNORMAL HIGH (ref 70–99)
Potassium: 3.8 mmol/L (ref 3.5–5.1)
Sodium: 138 mmol/L (ref 135–145)

## 2022-08-27 NOTE — ED Triage Notes (Signed)
Pt reports left leg redness and swelling. Pt reports he fell about 10 days ago and had some abrasions pt reports now swelling and redness to area. Pt is diabetic. Pt talks in complete sentences no respiratory  distress noted

## 2022-08-28 ENCOUNTER — Emergency Department
Admission: EM | Admit: 2022-08-28 | Discharge: 2022-08-28 | Disposition: A | Discharge status: Home / Self Care | Payer: Medicare HMO | Attending: Emergency Medicine | Admitting: Emergency Medicine

## 2022-08-28 ENCOUNTER — Emergency Department: Payer: Medicare HMO

## 2022-08-28 DIAGNOSIS — L03116 Cellulitis of left lower limb: Secondary | ICD-10-CM

## 2022-08-28 DIAGNOSIS — M7989 Other specified soft tissue disorders: Secondary | ICD-10-CM | POA: Diagnosis not present

## 2022-08-28 LAB — CBG MONITORING, ED
Glucose-Capillary: 281 mg/dL — ABNORMAL HIGH (ref 70–99)
Glucose-Capillary: 367 mg/dL — ABNORMAL HIGH (ref 70–99)

## 2022-08-28 MED ORDER — SULFAMETHOXAZOLE-TRIMETHOPRIM 800-160 MG PO TABS: 1.0000 | ORAL_TABLET | Freq: Two times a day (BID) | ORAL | 0 refills | Status: AC

## 2022-08-28 MED ORDER — SULFAMETHOXAZOLE-TRIMETHOPRIM 800-160 MG PO TABS
1.0000 | ORAL_TABLET | Freq: Once | ORAL | Status: AC
  Administered 2022-08-28: 1 via ORAL
  Filled 2022-08-28: qty 1

## 2022-08-28 NOTE — Discharge Instructions (Signed)
Take antibiotics as prescribed.   See your doctor this week.   Please keep your wound clean by washing at least daily with soap and water. If you see any signs of spreading infection like increasing redness, pus coming from the wound, extreme pain, fevers, chills or any other worsening doctor right away or come back to the emergency department  Thank you for choosing [place] for your health care today!  Please see your primary doctor this week for a follow up appointment.   If you do not have a primary doctor call the following clinics to establish care:  If you have insurance:  [facility] [phone] [place] [address]   [facility]  [phone] [address] [place] [address]   If you do not have insurance:  [facility]  [phone] [address], [place] [address]  Sometimes, in the early stages of certain disease courses it is difficult to detect in the emergency department evaluation -- so, it is important that you continue to monitor your symptoms and call your doctor right away or return to the emergency department if you develop any new or worsening symptoms.  It was my pleasure to care for you today.   Hoover Brunette Jacelyn Grip, MD

## 2022-08-28 NOTE — ED Provider Notes (Signed)
[facility] Provider Note    Event Date/Time   First MD Initiated Contact with Patient 08/28/22 0244     (approximate)   History   Leg Swelling   HPI  [name] is a 81 y.o. male   Past medical history of diabetes, high cholesterol, hypothyroid, presents with concern for leg infection.  He sustained a fall over 1 week ago with an abrasion to the left lower leg and was advised to come back to the emergency department if wound care failed and he developed signs of infection.  Indeed he did develop some redness surrounding the wound over the last several days.  He denies systemic symptoms like fevers chills nausea or vomiting.  Pain is minimal.  No discharge.  History was obtained via the patient and his wife who is at bedside. External medical chart note review including emergency department visit on 08/11/2022 when he initially sustained this fall and was noted to have an abrasion to the affected leg along with a laceration repair to the head.      Physical Exam   Triage Vital Signs: ED Triage Vitals  Enc Vitals Group     BP 08/27/22 1908 (!) 156/92     Pulse Rate 08/27/22 1908 100     Resp 08/27/22 1908 20     Temp 08/27/22 1908 98.4 F (36.9 C)     Temp Source 08/27/22 1908 Oral     SpO2 08/27/22 1908 93 %     Weight 08/27/22 1908 254 lb (115.2 kg)     Height 08/27/22 1908 5\' 11"  (1.803 m)     Head Circumference --      Peak Flow --      Pain Score 08/27/22 1913 0     Pain Loc --      Pain Edu? --      Excl. in GC? --     Most recent vital signs: Vitals:   08/28/22 0324 08/28/22 0719  BP:  (!) 133/104  Pulse:  97  Resp:  18  Temp: 98 F (36.7 C)   SpO2:  94%    General: Awake, no distress.  CV:  Good peripheral perfusion.  Resp:  Normal effort.  Abd:  No distention.  Other:  Healing wound to the left eyebrow no signs of infection however the left lower extremity abrasion has a scab and some surrounding cellulitic changes  without crepitus or pain out of proportion, no fluctuance.   ED Results / Procedures / Treatments   Labs (all labs ordered are listed, but only abnormal results are displayed) Labs Reviewed  BASIC METABOLIC PANEL - Abnormal; Notable for the following components:      Result Value   Glucose, Bld 234 (*)    All other components within normal limits  CBC WITH DIFFERENTIAL/PLATELET - Abnormal; Notable for the following components:   RBC 3.66 (*)    Hemoglobin 12.5 (*)    HCT 36.8 (*)    MCV 100.5 (*)    MCH 34.2 (*)    Neutro Abs 1.6 (*)    Lymphs Abs 4.8 (*)    Abs Immature Granulocytes 0.09 (*)    All other components within normal limits  CBG MONITORING, ED - Abnormal; Notable for the following components:   Glucose-Capillary 281 (*)    All other components within normal limits  CBG MONITORING, ED - Abnormal; Notable for the following components:   Glucose-Capillary 367 (*)    All other  components within normal limits     I reviewed labs and they are notable for blood sugar is 200s and he has no elevated white blood cell count.    RADIOLOGY I independently reviewed and interpreted ultrasound of the leg and see no DVT   PROCEDURES:  Critical Care performed: No  Procedures   MEDICATIONS ORDERED IN ED: Medications  sulfamethoxazole-trimethoprim (BACTRIM DS) 800-160 MG per tablet 1 tablet (1 tablet Oral Given 08/28/22 0716)    IMPRESSION / MDM / ASSESSMENT AND PLAN / ED COURSE  I reviewed the triage vital signs and the nursing notes.                              Differential diagnosis includes, but is not limited to, cellulitis, abscess, necrotizing fasciitis less likely, considered DVT.    MDM: Cellulitic changes surrounding the left lower extremity, DVT ultrasound ordered given swelling to the affected limb concern for DVT but was negative.  Clinical exam as above very low likelihood of emergent pathologies like necrotizing fasciitis or compartment syndrome or  sepsis, will start on cellulitic coverage and have him follow-up with PMD and return if worsening.   Patient's presentation is most consistent with acute presentation with potential threat to life or bodily function.       FINAL CLINICAL IMPRESSION(S) / ED DIAGNOSES   Final diagnoses:  Cellulitis of left lower extremity     Rx / DC Orders   ED Discharge Orders          Ordered    sulfamethoxazole-trimethoprim (BACTRIM DS) 800-160 MG tablet  2 times daily        08/28/22 0707             Note:  This document was prepared using Dragon voice recognition software and may include unintentional dictation errors.    Lucillie Garfinkel, MD 08/28/22 [phone]

## 2022-08-30 ENCOUNTER — Encounter: Payer: Self-pay | Admitting: Nurse Practitioner

## 2022-08-30 ENCOUNTER — Ambulatory Visit (INDEPENDENT_AMBULATORY_CARE_PROVIDER_SITE_OTHER): Payer: Medicare HMO | Admitting: Nurse Practitioner

## 2022-08-30 VITALS — BP 143/70 | HR 91 | Temp 98.4°F | Resp 16 | Ht 71.0 in | Wt 256.0 lb

## 2022-08-30 DIAGNOSIS — L03116 Cellulitis of left lower limb: Secondary | ICD-10-CM | POA: Diagnosis not present

## 2022-08-30 NOTE — Progress Notes (Signed)
[facility] [place], [place] [address]  Internal MEDICINE  Office Visit Note  Patient Name: [name]  [id number]  [id number]  Date of Service: 08/30/2022  Chief Complaint  Patient presents with   Follow-up   Diabetes   Gastroesophageal Reflux   Hyperlipidemia   Hypertension    HPI [name] presents for a follow up visit for ED visit for cellulitis of the left leg Left leg looking better, went to ED, started on bactrim, feeling better.  Does not need any refills today    Current Medication: Outpatient Encounter Medications as of 08/30/2022  Medication Sig   albuterol (VENTOLIN HFA) 108 (90 Base) MCG/ACT inhaler Inhale 2 puffs into the lungs every 6 (six) hours as needed for wheezing or shortness of breath.   ARMOUR THYROID 90 MG tablet Take 90 mg by mouth daily.   aspirin EC 81 MG tablet Take 81 mg by mouth daily.   atorvastatin (LIPITOR) 40 MG tablet TAKE 1 TABLET BY MOUTH EVERY DAY   benazepril (LOTENSIN) 40 MG tablet Take 1 tablet (40 mg total) by mouth daily.   carvedilol (COREG) 6.25 MG tablet TAKE ONE TABLET BY MOUTH TWO TIMES A DAY HYPERTENSION   Cholecalciferol 1000 units capsule Take 1,000 Units by mouth daily.    Cinnamon 500 MG TABS Take 1,000 mg by mouth daily.   Glucosamine-Chondroitin 500-400 MG CAPS Take 3 tablets by mouth daily.   HUMULIN R 100 UNIT/ML injection Inject 0.15-0.17 mLs (15-17 Units total) into the skin 2 (two) times daily before a meal. And 16 units in the morning and per sliding scale   hydrochlorothiazide (HYDRODIURIL) 25 MG tablet Take 1 tablet (25 mg total) by mouth daily.   insulin glargine-yfgn (SEMGLEE) 100 UNIT/ML Pen INJECT 57 UNITS UNDER SKIN EVERY DAY DISCARD PEN 28 DAYS AFTER OPENING.   insulin regular (HUMULIN R) 100 units/mL injection USE 17 UNITS SUBCUTANEOUSLY TWICE DAILY WITH MEALS AND USE A SLIDING SCALE IF SUGARS GET HIGH   [place] Ginseng 1000 MG TABS Take 1,000 mg by mouth daily.   Omega 3 1200  MG CAPS Take 1,200 mg by mouth daily.   ONE TOUCH ULTRA TEST test strip CHECK SUGAR 5 X A DAY FOR DX 250.3 & FLUCTUATING SUGAR   pyridOXINE (VITAMIN B-6) 100 MG tablet Take 100 mg by mouth daily.   [EXPIRED] sulfamethoxazole-trimethoprim (BACTRIM DS) 800-160 MG tablet Take 1 tablet by mouth 2 (two) times daily for 10 days.   zinc gluconate 50 MG tablet Take 50 mg by mouth daily.   No facility-administered encounter medications on file as of 08/30/2022.    Surgical History: Past Surgical History:  Procedure Laterality Date   CARPAL TUNNEL RELEASE Right 1980's   CARPAL TUNNEL RELEASE Left 05/05/2016   Procedure: CARPAL TUNNEL RELEASE;  Surgeon: Hessie Knows, MD;  Location: [facility];  Service: Orthopedics;  Laterality: Left;   CATARACT EXTRACTION W/PHACO Left 05/12/2015   Procedure: CATARACT EXTRACTION PHACO AND INTRAOCULAR LENS PLACEMENT (IOC);  Surgeon: Birder Robson, MD;  Location: [facility];  Service: Ophthalmology;  Laterality: Left;  [place] 01:53.1AP% 21.4CDE 24.27fluid pack lot # [id number]   CATARACT EXTRACTION W/PHACO Right 07/02/2019   Procedure: CATARACT EXTRACTION PHACO AND INTRAOCULAR LENS PLACEMENT (IOC) RIGHT DIABETIC  01:53.2  18.9%  21.43;  Surgeon: Birder Robson, MD;  Location: [place] [facility];  Service: Ophthalmology;  Laterality: Right;  Diabetic - insulin   EYE SURGERY Left    cataract extractions   INCISION AND DRAINAGE OF WOUND  Left 1988   "staph infection" left leg   INTRAMEDULLARY (IM) NAIL INTERTROCHANTERIC Right 01/22/2018   Procedure: INTRAMEDULLARY (IM) NAIL INTERTROCHANTRIC;  Surgeon: Thornton Park, MD;  Location: [facility];  Service: Orthopedics;  Laterality: Right;   KNEE ARTHROPLASTY Left 12/28/2015   Procedure: COMPUTER ASSISTED TOTAL KNEE ARTHROPLASTY;  Surgeon: Dereck Leep, MD;  Location: [facility];  Service: Orthopedics;  Laterality: Left;   ORIF WRIST FRACTURE Left 05/05/2016   Procedure: OPEN REDUCTION INTERNAL FIXATION (ORIF) WRIST FRACTURE;  Surgeon:  Hessie Knows, MD;  Location: [facility];  Service: Orthopedics;  Laterality: Left;    Medical History: Past Medical History:  Diagnosis Date   Agent orange exposure    [place] war exposure    Arthritis    "left knee" (03/18/2014)   Closed pelvic fracture ([place]) 03/18/2014   "multiple S/P fall w/loss of consciousness; CBG 29"   Encephalopathy acute 08/16/2013   GERD (gastroesophageal reflux disease)    High cholesterol    History of use of hearing aid in both ears    does not currently wear   Hypertension    Hypothyroidism    Osteopenia    Type II diabetes mellitus ([place])     Family History: Family History  Problem Relation Age of Onset   Colon cancer Mother    Cerebral aneurysm Father    Prostate cancer Neg Hx    Bladder Cancer Neg Hx    Kidney cancer Neg Hx     Social History   Socioeconomic History   Marital status: Married    Spouse name: Not on file   Number of children: Not on file   Years of education: Not on file   Highest education level: Not on file  Occupational History   Not on file  Tobacco Use   Smoking status: Former    Packs/day: 3.00    Years: 30.00    Total pack years: 90.00    Types: Cigarettes    Quit date: 31    Years since quitting: 35.9   Smokeless tobacco: Never   Tobacco comments:    "quit smoking in 1988"  Vaping Use   Vaping Use: Never used  Substance and Sexual Activity   Alcohol use: No   Drug use: No   Sexual activity: Never  Other Topics Concern   Not on file  Social History Narrative   Not on file   Social Determinants of Health   Financial Resource Strain: Not on file  Food Insecurity: Not on file  Transportation Needs: Not on file  Physical Activity: Not on file  Stress: Not on file  Social Connections: Not on file  Intimate Partner Violence: Not on file      Review of Systems  Constitutional:  Negative for activity change, appetite change, chills, fatigue, fever and unexpected weight change.  HENT:   Negative for congestion, ear pain, rhinorrhea, sneezing, sore throat and trouble swallowing.   Eyes: Negative.  Negative for redness.  Respiratory: Negative.  Negative for cough, chest tightness, shortness of breath and wheezing.   Cardiovascular: Negative.  Negative for chest pain and palpitations.  Gastrointestinal: Negative.  Negative for abdominal pain, blood in stool, constipation, diarrhea, nausea and vomiting.  Endocrine: Negative.   Genitourinary: Negative.  Negative for difficulty urinating, dysuria, frequency, hematuria and urgency.  Musculoskeletal: Negative.  Negative for arthralgias, back pain, joint swelling, myalgias and neck pain.  Skin:  Positive for wound. Negative for rash.  Allergic/Immunologic: Negative.  Negative for immunocompromised state.  Neurological: Negative.  Negative for dizziness, tremors, seizures, numbness and headaches.  Hematological: Negative.  Negative for adenopathy. Does not bruise/bleed easily.  Psychiatric/Behavioral: Negative.  Negative for agitation, behavioral problems (Depression), self-injury, sleep disturbance and suicidal ideas. The patient is not nervous/anxious.     Vital Signs: BP (!) 143/70   Pulse 91   Temp 98.4 F (36.9 C)   Resp 16   Ht 5\' 11"  (1.803 m)   Wt 256 lb (116.1 kg)   SpO2 93%   BMI 35.70 kg/m    Physical Exam Vitals reviewed.  Constitutional:      General: He is not in acute distress.    Appearance: Normal appearance. He is obese. He is not ill-appearing.  HENT:     Head: Normocephalic and atraumatic.  Eyes:     Pupils: Pupils are equal, round, and reactive to light.  Cardiovascular:     Rate and Rhythm: Normal rate and regular rhythm.  Pulmonary:     Effort: Pulmonary effort is normal. No respiratory distress.  Skin:    Comments: Left leg redness is improving, cellulitis resolving  Neurological:     Mental Status: He is alert and oriented to person, place, and time.  Psychiatric:        Mood and Affect:  Mood normal.        Behavior: Behavior normal.        Assessment/Plan: 1. Cellulitis of left leg Improving with antibiotic treatment, continue until gone.    General Counseling: Malva Cogan understanding of the findings of todays visit and agrees with plan of treatment. I have discussed any further diagnostic evaluation that may be needed or ordered today. We also reviewed his medications today. he has been encouraged to call the office with any questions or concerns that should arise related to todays visit.    No orders of the defined types were placed in this encounter.   No orders of the defined types were placed in this encounter.   Return for previously scheduled, F/U, Yudith Norlander PCP.   Total time spent:20 Minutes Time spent includes review of chart, medications, test results, and follow up plan with the patient.   [place] Controlled Substance Database was reviewed by me.  This patient was seen by Jonetta Osgood, FNP-C in collaboration with Dr. Clayborn Bigness as a part of collaborative care agreement.   Urania Pearlman R. Valetta Fuller, MSN, FNP-C Internal medicine

## 2022-09-08 ENCOUNTER — Emergency Department: Payer: Medicare HMO

## 2022-09-08 ENCOUNTER — Other Ambulatory Visit: Payer: Self-pay

## 2022-09-08 ENCOUNTER — Emergency Department
Admission: EM | Admit: 2022-09-08 | Discharge: 2022-09-08 | Disposition: A | Discharge status: Home / Self Care | Payer: Medicare HMO | Attending: Emergency Medicine | Admitting: Emergency Medicine

## 2022-09-08 DIAGNOSIS — S6992XA Unspecified injury of left wrist, hand and finger(s), initial encounter: Secondary | ICD-10-CM | POA: Diagnosis not present

## 2022-09-08 DIAGNOSIS — I10 Essential (primary) hypertension: Secondary | ICD-10-CM | POA: Diagnosis not present

## 2022-09-08 DIAGNOSIS — S68123A Partial traumatic metacarpophalangeal amputation of left middle finger, initial encounter: Secondary | ICD-10-CM | POA: Diagnosis not present

## 2022-09-08 DIAGNOSIS — S68613A Complete traumatic transphalangeal amputation of left middle finger, initial encounter: Secondary | ICD-10-CM | POA: Diagnosis not present

## 2022-09-08 DIAGNOSIS — W268XXA Contact with other sharp object(s), not elsewhere classified, initial encounter: Secondary | ICD-10-CM | POA: Diagnosis not present

## 2022-09-08 DIAGNOSIS — Z23 Encounter for immunization: Secondary | ICD-10-CM | POA: Insufficient documentation

## 2022-09-08 DIAGNOSIS — R0602 Shortness of breath: Secondary | ICD-10-CM | POA: Diagnosis not present

## 2022-09-08 DIAGNOSIS — S68119A Complete traumatic metacarpophalangeal amputation of unspecified finger, initial encounter: Secondary | ICD-10-CM

## 2022-09-08 LAB — COMPREHENSIVE METABOLIC PANEL
ALT: 49 U/L — ABNORMAL HIGH (ref 0–44)
AST: 37 U/L (ref 15–41)
Albumin: 4 g/dL (ref 3.5–5.0)
Alkaline Phosphatase: 96 U/L (ref 38–126)
Anion gap: 8 (ref 5–15)
BUN: 21 mg/dL (ref 8–23)
CO2: 24 mmol/L (ref 22–32)
Calcium: 9 mg/dL (ref 8.9–10.3)
Chloride: 105 mmol/L (ref 98–111)
Creatinine, Ser: 1.2 mg/dL (ref 0.61–1.24)
GFR, Estimated: >60 mL/min (ref >60)
Glucose, Bld: 268 mg/dL — ABNORMAL HIGH (ref 70–99)
Potassium: 4.9 mmol/L (ref 3.5–5.1)
Sodium: 137 mmol/L (ref 135–145)
Total Bilirubin: 0.6 mg/dL (ref 0.3–1.2)
Total Protein: 7 g/dL (ref 6.5–8.1)

## 2022-09-08 LAB — CBC WITH DIFFERENTIAL/PLATELET
Abs Immature Granulocytes: 0.08 10*3/uL — ABNORMAL HIGH (ref 0.00–0.07)
Basophils Absolute: 0.1 10*3/uL (ref 0.0–0.1)
Basophils Relative: 1 %
Eosinophils Absolute: 0.1 10*3/uL (ref 0.0–0.5)
Eosinophils Relative: 2 %
HCT: 35.6 % — ABNORMAL LOW (ref 39.0–52.0)
Hemoglobin: 11.9 g/dL — ABNORMAL LOW (ref 13.0–17.0)
Immature Granulocytes: 1 %
Lymphocytes Relative: 53 %
Lymphs Abs: 3.3 10*3/uL (ref 0.7–4.0)
MCH: 34.1 pg — ABNORMAL HIGH (ref 26.0–34.0)
MCHC: 33.4 g/dL (ref 30.0–36.0)
MCV: 102 fL — ABNORMAL HIGH (ref 80.0–100.0)
Monocytes Absolute: 0.5 10*3/uL (ref 0.1–1.0)
Monocytes Relative: 8 %
Neutro Abs: 2.2 10*3/uL (ref 1.7–7.7)
Neutrophils Relative %: 35 %
Platelets: 140 10*3/uL — ABNORMAL LOW (ref 150–400)
RBC: 3.49 MIL/uL — ABNORMAL LOW (ref 4.22–5.81)
RDW: 14.9 % (ref 11.5–15.5)
WBC: 6.2 10*3/uL (ref 4.0–10.5)
nRBC: 0 % (ref 0.0–0.2)

## 2022-09-08 LAB — TROPONIN I (HIGH SENSITIVITY): Troponin I (High Sensitivity): 6 ng/L (ref <18)

## 2022-09-08 MED ORDER — ONDANSETRON HCL 4 MG/2ML IJ SOLN
4.0000 mg | Freq: Once | INTRAMUSCULAR | Status: AC
  Administered 2022-09-08: 4 mg via INTRAVENOUS
  Filled 2022-09-08: qty 2

## 2022-09-08 MED ORDER — TETANUS-DIPHTH-ACELL PERTUSSIS 5-2.5-18.5 LF-MCG/0.5 IM SUSY
0.5000 mL | PREFILLED_SYRINGE | Freq: Once | INTRAMUSCULAR | Status: AC
  Administered 2022-09-08: 0.5 mL via INTRAMUSCULAR
  Filled 2022-09-08: qty 0.5

## 2022-09-08 MED ORDER — DOXYCYCLINE HYCLATE 100 MG PO TABS: 100.0000 mg | ORAL_TABLET | Freq: Two times a day (BID) | ORAL | 0 refills | Status: DC

## 2022-09-08 MED ORDER — LIDOCAINE HCL (PF) 1 % IJ SOLN
10.0000 mL | Freq: Once | INTRAMUSCULAR | Status: AC
  Administered 2022-09-08: 10 mL
  Filled 2022-09-08: qty 10

## 2022-09-08 MED ORDER — OXYCODONE-ACETAMINOPHEN 5-325 MG PO TABS
1.0000 | ORAL_TABLET | Freq: Once | ORAL | Status: AC
  Administered 2022-09-08: 1 via ORAL
  Filled 2022-09-08: qty 1

## 2022-09-08 MED ORDER — MORPHINE SULFATE (PF) 4 MG/ML IV SOLN
4.0000 mg | Freq: Once | INTRAVENOUS | Status: AC
  Administered 2022-09-08: 4 mg via INTRAVENOUS
  Filled 2022-09-08: qty 1

## 2022-09-08 MED ORDER — OXYCODONE-ACETAMINOPHEN 5-325 MG PO TABS: 1.0000 | ORAL_TABLET | Freq: Four times a day (QID) | ORAL | 0 refills | Status: DC | PRN

## 2022-09-08 NOTE — ED Triage Notes (Signed)
Pt comes with c/o left middle finger laceration. Pt was working on lawnmower and was leaned over hood. Pt went to fall forward and caught his hand on something. Bleeding controlled at this time.

## 2022-09-08 NOTE — ED Notes (Signed)
ED Provider at bedside. 

## 2022-09-08 NOTE — ED Notes (Signed)
E signature pad not working. Pt educated on discharge instructions and verbalized understanding.  

## 2022-09-08 NOTE — ED Provider Notes (Signed)
[facility] Provider Note  Patient Contact: 5:36 PM (approximate)   History   Laceration   HPI  [name] is a 81 y.o. male who presents the emergency department for a left hand injury.  Patient was working on his lawn more changing the blade when he slipped and fell forward.  Patient caught his hand on the blade creating a rather deep laceration to the middle finger of the left hand.  Patient is missing tissue from the DIP joint along the palmar aspect of the finger.  Nail is still intact but large amount of tissue along the pad is currently missing.  No active bleeding.  Patient denies any other musculoskeletal trauma to the left upper extremity.  Unsure his last tetanus shot.  While here patient is complaining of some shortness of breath.  He states that this started today.  He has no associated chest pain.  Patient has no cough, nasal congestion, sore throat, fevers or chills.  Patient denies any history of COPD     Physical Exam   Triage Vital Signs: ED Triage Vitals  Enc Vitals Group     BP 09/08/22 1652 (!) 153/85     Pulse Rate 09/08/22 1652 (!) 110     Resp 09/08/22 1652 17     Temp 09/08/22 1652 98.4 F (36.9 C)     Temp src --      SpO2 09/08/22 1652 93 %     Weight 09/08/22 1715 255 lb 11.7 oz (116 kg)     Height 09/08/22 1715 5\' 11"  (1.803 m)     Head Circumference --      Peak Flow --      Pain Score 09/08/22 1639 4     Pain Loc --      Pain Edu? --      Excl. in GC? --     Most recent vital signs: Vitals:   09/08/22 1652  BP: (!) 153/85  Pulse: (!) 110  Resp: 17  Temp: 98.4 F (36.9 C)  SpO2: 93%     General: Alert and in no acute distress. ENT:      Ears:       Nose: No congestion/rhinnorhea.      Mouth/Throat: Mucous membranes are moist. Neck: No stridor. No cervical spine tenderness to palpation.  Cardiovascular:  Good peripheral perfusion Respiratory: Normal respiratory effort without tachypnea or  retractions. Lungs with expiratory wheezes bilaterally.  No expiratory wheezing.  No rales or rhonchi.13/09/23 air entry to the bases with no decreased or absent breath sounds Musculoskeletal: Full range of motion to all extremities.  Patient has soft tissue injury to the middle finger of the left hand.  See below pictures.  Patient is missing subcutaneous and epidermal and dermal tissue from the DIP joint along the finger pad.  Patient has an extension of epidermal tissue loss behind the nailbed the nail is firmly intact at this time.  Limited range of motion due to pain.  No active bleeding.  Sensation still intact and intact skin to the distal aspect of the finger.  Remaining skin is appropriately colored though cap refill is not tested given the soft tissue injury. Neurologic:  No gross focal neurologic deficits are appreciated.  Skin:   No rash noted Other:          ED Results / Procedures / Treatments   Labs (all labs ordered are listed, but only abnormal results are displayed) Labs Reviewed  COMPREHENSIVE METABOLIC PANEL - Abnormal; Notable for the following components:      Result Value   Glucose, Bld 268 (*)    ALT 49 (*)    All other components within normal limits  CBC WITH DIFFERENTIAL/PLATELET - Abnormal; Notable for the following components:   RBC 3.49 (*)    Hemoglobin 11.9 (*)    HCT 35.6 (*)    MCV 102.0 (*)    MCH 34.1 (*)    Platelets 140 (*)    Abs Immature Granulocytes 0.08 (*)    All other components within normal limits  TROPONIN I (HIGH SENSITIVITY)  TROPONIN I (HIGH SENSITIVITY)     EKG     RADIOLOGY  I personally viewed, evaluated, and interpreted these images as part of my medical decision making, as well as reviewing the written report by the radiologist.  ED Provider Interpretation: Patient with oblique amputation of the distal phalanx of the middle finger left hand.  Associated soft tissue injury.  Chest x-ray with some mild cardiomegaly but  no other acute cardiopulmonary abnormality  DG Chest 2 View  Result Date: 09/08/2022 CLINICAL DATA:  Shortness of breath. EXAM: CHEST - 2 VIEW COMPARISON:  12/09/2021 FINDINGS: Grossly unchanged borderline enlarged cardiac silhouette and mediastinal contours with atherosclerotic plaque within thoracic aorta. Lungs remain hyperexpanded with flattening the bilateral hemidiaphragms. No focal airspace opacities. No pleural effusion or pneumothorax. No evidence of edema. Suspected old/healed left lower anterior rib fractures, unchanged. Moderate to severe compression fractures within the superior aspect of the thoracic spine, similar to the 12/2021 examination. IMPRESSION: Similar findings of lung hyperexpansion and borderline cardiomegaly without superimposed acute cardiopulmonary disease. Electronically Signed   By: Simonne Come M.D.   On: 09/08/2022 18:10   DG Hand Complete Left  Result Date: 09/08/2022 CLINICAL DATA:  Left middle finger injury with soft tissue loss over the distal phalanx following lawnmower accident. EXAM: LEFT HAND - COMPLETE 3+ VIEW COMPARISON:  None Available. FINDINGS: There is an oblique amputation involving the distal phalanx of the middle digit with associated soft tissue laceration. No intra-articular extension. No radiopaque foreign body. Sequela of previous distal radial sideplate fixation, incompletely evaluated but without evidence of hardware failure or loosening within the imaged components. Degenerative change involving the STT joints of the base of the thumb. Remaining joint spaces appear preserved. No erosions. Distal vascular calcifications. IMPRESSION: Oblique amputation involving the distal phalanx of the middle digit without intra-articular extension or radiopaque foreign body. Electronically Signed   By: Simonne Come M.D.   On: 09/08/2022 18:08    PROCEDURES:  Critical Care performed: No  Procedures   MEDICATIONS ORDERED IN ED: Medications  Tdap (BOOSTRIX)  injection 0.5 mL (0.5 mLs Intramuscular Given 09/08/22 1812)  morphine (PF) 4 MG/ML injection 4 mg (4 mg Intravenous Given 09/08/22 1811)  ondansetron (ZOFRAN) injection 4 mg (4 mg Intravenous Given 09/08/22 1809)  lidocaine (PF) (XYLOCAINE) 1 % injection 10 mL (10 mLs Infiltration Given 09/08/22 1934)  oxyCODONE-acetaminophen (PERCOCET/ROXICET) 5-325 MG per tablet 1 tablet (1 tablet Oral Given 09/08/22 2022)     IMPRESSION / MDM / ASSESSMENT AND PLAN / ED COURSE  I reviewed the triage vital signs and the nursing notes.                              Differential diagnosis includes, but is not limited to, finger amputation, soft tissue injury of the finger, finger laceration, nailbed injury, COPD,  emphysema, bronchitis, pneumonia  Patient's presentation is most consistent with acute presentation with potential threat to life or bodily function.   Patient's diagnosis is consistent with partial finger amputation.  Patient presented to the emergency department with an injury to the middle finger of the left hand.  This was sustained while trying to change the blade to a lawn more.  Patient was missing soft tissue over the pad of the finger.  Given the injury I was concerned that patient may have sustained underlying musculoskeletal trauma and ultimately may need amputation of the finger with or without bony trauma.  X-ray reveals findings consistent with an oblique amputation of the distal phalanx.  I discussed the patient with on-call orthopedic surgeon, Dr. Allena Katz who after reviewing the pictures in the chart, patient's history and x-ray felt that this likely would need AN amputation with a flap.  Recommends follow-up as an outpatient with hand surgery.  Patient had anesthetize Asian of the middle finger with digital block so we could perform cleaning and ultimately bandaging.  Patiently placed on antibiotics.  Tetanus shot updated.  Patient will have prescription for pain medication at home.  Follow-up with  hand surgery.. Patient is given ED precautions to return to the ED for any worsening or new symptoms.        FINAL CLINICAL IMPRESSION(S) / ED DIAGNOSES   Final diagnoses:  Amputation of tip of finger, initial encounter     Rx / DC Orders   ED Discharge Orders          Ordered    oxyCODONE-acetaminophen (PERCOCET/ROXICET) 5-325 MG tablet  Every 6 hours PRN        09/08/22 2023    doxycycline (VIBRA-TABS) 100 MG tablet  2 times daily        09/08/22 2023             Note:  This document was prepared using Dragon voice recognition software and may include unintentional dictation errors.   Lanette Hampshire 09/08/22 2025    Concha Se, MD 09/11/22 [phone]

## 2022-09-09 DIAGNOSIS — S62633B Displaced fracture of distal phalanx of left middle finger, initial encounter for open fracture: Secondary | ICD-10-CM | POA: Diagnosis not present

## 2022-09-09 DIAGNOSIS — S68121A Partial traumatic metacarpophalangeal amputation of left index finger, initial encounter: Secondary | ICD-10-CM | POA: Diagnosis not present

## 2022-09-15 DIAGNOSIS — S62633B Displaced fracture of distal phalanx of left middle finger, initial encounter for open fracture: Secondary | ICD-10-CM | POA: Diagnosis not present

## 2022-09-15 DIAGNOSIS — S68121A Partial traumatic metacarpophalangeal amputation of left index finger, initial encounter: Secondary | ICD-10-CM | POA: Diagnosis not present

## 2022-09-16 ENCOUNTER — Ambulatory Visit (INDEPENDENT_AMBULATORY_CARE_PROVIDER_SITE_OTHER): Payer: Medicare HMO | Admitting: Nurse Practitioner

## 2022-09-16 ENCOUNTER — Encounter: Payer: Self-pay | Admitting: Nurse Practitioner

## 2022-09-16 VITALS — BP 138/65 | HR 85 | Temp 97.2°F | Resp 16 | Ht 71.0 in | Wt 250.8 lb

## 2022-09-16 DIAGNOSIS — Z794 Long term (current) use of insulin: Secondary | ICD-10-CM | POA: Diagnosis not present

## 2022-09-16 DIAGNOSIS — I10 Essential (primary) hypertension: Secondary | ICD-10-CM | POA: Diagnosis not present

## 2022-09-16 DIAGNOSIS — E1165 Type 2 diabetes mellitus with hyperglycemia: Secondary | ICD-10-CM | POA: Diagnosis not present

## 2022-09-16 DIAGNOSIS — S68623S Partial traumatic transphalangeal amputation of left middle finger, sequela: Secondary | ICD-10-CM | POA: Diagnosis not present

## 2022-09-16 NOTE — Progress Notes (Unsigned)
[facility] [address] [place], [place] [address]  Internal MEDICINE  Office Visit Note  Patient Name: [name]  [id number]  [id number]  Date of Service: 09/16/2022  Chief Complaint  Patient presents with   Follow-up   Diabetes   Gastroesophageal Reflux   Hypertension   Hyperlipidemia    HPI [name] presents for a follow-up visit for  BP check Partial amputation of third finger of left hand, seeing ortho dr. Earl Many to ED on 11/9 Sliced it on [employer].  Lost 5 lbs     Current Medication: Outpatient Encounter Medications as of 09/16/2022  Medication Sig   albuterol (VENTOLIN HFA) 108 (90 Base) MCG/ACT inhaler Inhale 2 puffs into the lungs every 6 (six) hours as needed for wheezing or shortness of breath.   ARMOUR THYROID 90 MG tablet Take 90 mg by mouth daily.   aspirin EC 81 MG tablet Take 81 mg by mouth daily.   atorvastatin (LIPITOR) 40 MG tablet TAKE 1 TABLET BY MOUTH EVERY DAY   benazepril (LOTENSIN) 40 MG tablet Take 1 tablet (40 mg total) by mouth daily.   carvedilol (COREG) 6.25 MG tablet TAKE ONE TABLET BY MOUTH TWO TIMES A DAY HYPERTENSION   Cholecalciferol 1000 units capsule Take 1,000 Units by mouth daily.    Cinnamon 500 MG TABS Take 1,000 mg by mouth daily.   doxycycline (VIBRA-TABS) 100 MG tablet Take 1 tablet (100 mg total) by mouth 2 (two) times daily.   Glucosamine-Chondroitin 500-400 MG CAPS Take 3 tablets by mouth daily.   HUMULIN R 100 UNIT/ML injection Inject 0.15-0.17 mLs (15-17 Units total) into the skin 2 (two) times daily before a meal. And 16 units in the morning and per sliding scale   hydrochlorothiazide (HYDRODIURIL) 25 MG tablet Take 1 tablet (25 mg total) by mouth daily.   insulin glargine-yfgn (SEMGLEE) 100 UNIT/ML Pen INJECT 57 UNITS UNDER SKIN EVERY DAY DISCARD PEN 28 DAYS AFTER OPENING.   insulin regular (HUMULIN R) 100 units/mL injection USE 17 UNITS SUBCUTANEOUSLY TWICE DAILY WITH MEALS AND USE  A SLIDING SCALE IF SUGARS GET HIGH   [place] Ginseng 1000 MG TABS Take 1,000 mg by mouth daily.   Omega 3 1200 MG CAPS Take 1,200 mg by mouth daily.   ONE TOUCH ULTRA TEST test strip CHECK SUGAR 5 X A DAY FOR DX 250.3 & FLUCTUATING SUGAR   oxyCODONE-acetaminophen (PERCOCET/ROXICET) 5-325 MG tablet Take 1 tablet by mouth every 6 (six) hours as needed for severe pain.   pyridOXINE (VITAMIN B-6) 100 MG tablet Take 100 mg by mouth daily.   zinc gluconate 50 MG tablet Take 50 mg by mouth daily.   No facility-administered encounter medications on file as of 09/16/2022.    Surgical History: Past Surgical History:  Procedure Laterality Date   CARPAL TUNNEL RELEASE Right 1980's   CARPAL TUNNEL RELEASE Left 05/05/2016   Procedure: CARPAL TUNNEL RELEASE;  Surgeon: Kennedy Bucker, MD;  Location: [facility];  Service: Orthopedics;  Laterality: Left;   CATARACT EXTRACTION W/PHACO Left 05/12/2015   Procedure: CATARACT EXTRACTION PHACO AND INTRAOCULAR LENS PLACEMENT (IOC);  Surgeon: Galen Manila, MD;  Location: [facility];  Service: Ophthalmology;  Laterality: Left;  [place] 01:53.1AP% 21.4CDE 24.59fluid pack lot # [id number]   CATARACT EXTRACTION W/PHACO Right 07/02/2019   Procedure: CATARACT EXTRACTION PHACO AND INTRAOCULAR LENS PLACEMENT (IOC) RIGHT DIABETIC  01:53.2  18.9%  21.43;  Surgeon: Galen Manila, MD;  Location: [facility];  Service: Ophthalmology;  Laterality: Right;  Diabetic - insulin   EYE SURGERY Left    cataract extractions   INCISION AND DRAINAGE OF WOUND Left 1988   "staph infection" left leg   INTRAMEDULLARY (IM) NAIL INTERTROCHANTERIC Right 01/22/2018   Procedure: INTRAMEDULLARY (IM) NAIL INTERTROCHANTRIC;  Surgeon: Juanell Fairly, MD;  Location: [facility];  Service: Orthopedics;  Laterality: Right;   KNEE ARTHROPLASTY Left 12/28/2015   Procedure: COMPUTER ASSISTED TOTAL KNEE ARTHROPLASTY;  Surgeon: Donato Heinz, MD;  Location: [facility];  Service: Orthopedics;  Laterality: Left;    ORIF WRIST FRACTURE Left 05/05/2016   Procedure: OPEN REDUCTION INTERNAL FIXATION (ORIF) WRIST FRACTURE;  Surgeon: Kennedy Bucker, MD;  Location: [facility];  Service: Orthopedics;  Laterality: Left;    Medical History: Past Medical History:  Diagnosis Date   Agent orange exposure    [place] war exposure    Arthritis    "left knee" (03/18/2014)   Closed pelvic fracture (HCC) 03/18/2014   "multiple S/P fall w/loss of consciousness; CBG 29"   Encephalopathy acute 08/16/2013   GERD (gastroesophageal reflux disease)    High cholesterol    History of use of hearing aid in both ears    does not currently wear   Hypertension    Hypothyroidism    Osteopenia    Type II diabetes mellitus (HCC)     Family History: Family History  Problem Relation Age of Onset   Colon cancer Mother    Cerebral aneurysm Father    Prostate cancer Neg Hx    Bladder Cancer Neg Hx    Kidney cancer Neg Hx     Social History   Socioeconomic History   Marital status: Married    Spouse name: Not on file   Number of children: Not on file   Years of education: Not on file   Highest education level: Not on file  Occupational History   Not on file  Tobacco Use   Smoking status: Former    Packs/day: 3.00    Years: 30.00    Total pack years: 90.00    Types: Cigarettes    Quit date: 72    Years since quitting: 35.9   Smokeless tobacco: Never   Tobacco comments:    "quit smoking in 1988"  Vaping Use   Vaping Use: Never used  Substance and Sexual Activity   Alcohol use: No   Drug use: No   Sexual activity: Never  Other Topics Concern   Not on file  Social History Narrative   Not on file   Social Determinants of Health   Financial Resource Strain: Not on file  Food Insecurity: Not on file  Transportation Needs: Not on file  Physical Activity: Not on file  Stress: Not on file  Social Connections: Not on file  Intimate Partner Violence: Not on file      Review of Systems  Vital Signs: BP  138/65   Pulse 85   Temp (!) 97.2 F (36.2 C)   Resp 16   Ht 5\' 11"  (1.803 m)   Wt 250 lb 12.8 oz (113.8 kg)   SpO2 97%   BMI 34.98 kg/m    Physical Exam     Assessment/Plan:   General Counseling: [name] verbalizes understanding of the findings of todays visit and agrees with plan of treatment. I have discussed any further diagnostic evaluation that may be needed or ordered today. We also reviewed his medications today. he has been encouraged to call the office with any questions or  concerns that should arise related to todays visit.    No orders of the defined types were placed in this encounter.   No orders of the defined types were placed in this encounter.   Return in about 8 weeks (around 11/08/2022) for F/U, Recheck A1C, Jahmez Bily PCP.   Total time spent:*** Minutes Time spent includes review of chart, medications, test results, and follow up plan with the patient.   [place] Controlled Substance Database was reviewed by me.  This patient was seen by Sallyanne Kuster, FNP-C in collaboration with Dr. Beverely Risen as a part of collaborative care agreement.   Darren Caldron R. Tedd Sias, MSN, FNP-C Internal medicine

## 2022-09-17 ENCOUNTER — Encounter: Payer: Self-pay | Admitting: Nurse Practitioner

## 2022-09-29 DIAGNOSIS — S68121A Partial traumatic metacarpophalangeal amputation of left index finger, initial encounter: Secondary | ICD-10-CM | POA: Diagnosis not present

## 2022-09-29 DIAGNOSIS — S62633B Displaced fracture of distal phalanx of left middle finger, initial encounter for open fracture: Secondary | ICD-10-CM | POA: Diagnosis not present

## 2022-09-30 DIAGNOSIS — E119 Type 2 diabetes mellitus without complications: Secondary | ICD-10-CM | POA: Diagnosis not present

## 2022-09-30 DIAGNOSIS — B351 Tinea unguium: Secondary | ICD-10-CM | POA: Diagnosis not present

## 2022-10-21 ENCOUNTER — Other Ambulatory Visit: Payer: Self-pay | Admitting: Nurse Practitioner

## 2022-10-21 DIAGNOSIS — S62633B Displaced fracture of distal phalanx of left middle finger, initial encounter for open fracture: Secondary | ICD-10-CM | POA: Diagnosis not present

## 2022-10-21 DIAGNOSIS — E119 Type 2 diabetes mellitus without complications: Secondary | ICD-10-CM

## 2022-10-21 DIAGNOSIS — S68121A Partial traumatic metacarpophalangeal amputation of left index finger, initial encounter: Secondary | ICD-10-CM | POA: Diagnosis not present

## 2022-10-21 NOTE — Telephone Encounter (Signed)
Can you please review and send  

## 2022-11-11 ENCOUNTER — Ambulatory Visit (INDEPENDENT_AMBULATORY_CARE_PROVIDER_SITE_OTHER): Payer: Medicare HMO | Admitting: Nurse Practitioner

## 2022-11-11 ENCOUNTER — Encounter: Payer: Self-pay | Admitting: Nurse Practitioner

## 2022-11-11 VITALS — BP 132/62 | HR 99 | Temp 98.3°F | Resp 16 | Ht 71.0 in | Wt 245.0 lb

## 2022-11-11 DIAGNOSIS — I10 Essential (primary) hypertension: Secondary | ICD-10-CM | POA: Diagnosis not present

## 2022-11-11 DIAGNOSIS — Z794 Long term (current) use of insulin: Secondary | ICD-10-CM | POA: Diagnosis not present

## 2022-11-11 DIAGNOSIS — E782 Mixed hyperlipidemia: Secondary | ICD-10-CM | POA: Diagnosis not present

## 2022-11-11 DIAGNOSIS — E1165 Type 2 diabetes mellitus with hyperglycemia: Secondary | ICD-10-CM | POA: Diagnosis not present

## 2022-11-11 LAB — POCT GLYCOSYLATED HEMOGLOBIN (HGB A1C): Hemoglobin A1C: 7.3 % — AB (ref 4.0–5.6)

## 2022-11-11 MED ORDER — BENAZEPRIL HCL 40 MG PO TABS: 40.0000 mg | ORAL_TABLET | Freq: Every day | ORAL | 2 refills | Status: DC

## 2022-11-11 MED ORDER — ATORVASTATIN CALCIUM 40 MG PO TABS: 40.0000 mg | ORAL_TABLET | Freq: Every day | ORAL | 1 refills | Status: DC

## 2022-11-11 MED ORDER — HYDROCHLOROTHIAZIDE 25 MG PO TABS: 25.0000 mg | ORAL_TABLET | Freq: Every day | ORAL | 2 refills | Status: DC

## 2022-11-11 NOTE — Progress Notes (Signed)
[facility] [address] [place], [place] [address]  Internal MEDICINE  Office Visit Note  Patient Name: [name]  [id number]  [id number]  Date of Service: 11/11/2022  Chief Complaint  Patient presents with   Follow-up   Diabetes   Gastroesophageal Reflux   Hypertension   Hyperlipidemia    HPI [name] presents for a follow-up visit for diabetes, hypertension, and finger  Diabetes -- A1c increased to 7.3 since last visit.  Finger that was partially cut off is healing well Hypertension -- elevated some, rechecked. But improved when rechecked     Current Medication: Outpatient Encounter Medications as of 11/11/2022  Medication Sig   albuterol (VENTOLIN HFA) 108 (90 Base) MCG/ACT inhaler Inhale 2 puffs into the lungs every 6 (six) hours as needed for wheezing or shortness of breath.   ARMOUR THYROID 90 MG tablet Take 90 mg by mouth daily.   aspirin EC 81 MG tablet Take 81 mg by mouth daily.   carvedilol (COREG) 6.25 MG tablet TAKE ONE TABLET BY MOUTH TWO TIMES A DAY HYPERTENSION   Cholecalciferol 1000 units capsule Take 1,000 Units by mouth daily.    Cinnamon 500 MG TABS Take 1,000 mg by mouth daily.   doxycycline (VIBRA-TABS) 100 MG tablet Take 1 tablet (100 mg total) by mouth 2 (two) times daily.   Glucosamine-Chondroitin 500-400 MG CAPS Take 3 tablets by mouth daily.   HUMULIN R 100 UNIT/ML injection INJECT 0.15-0.17 MLS (15-17 UNITS TOTAL) INTO THE SKIN 2 (TWO) TIMES DAILY BEFORE A MEAL. AND 16 UNITS IN THE MORNING AND PER SLIDING SCALE   insulin glargine-yfgn (SEMGLEE) 100 UNIT/ML Pen INJECT 57 UNITS UNDER SKIN EVERY DAY DISCARD PEN 28 DAYS AFTER OPENING.   insulin regular (HUMULIN R) 100 units/mL injection USE 17 UNITS SUBCUTANEOUSLY TWICE DAILY WITH MEALS AND USE A SLIDING SCALE IF SUGARS GET HIGH   [place] Ginseng 1000 MG TABS Take 1,000 mg by mouth daily.   Omega 3 1200 MG CAPS Take 1,200 mg by mouth daily.   ONE TOUCH ULTRA TEST test strip CHECK SUGAR  5 X A DAY FOR DX 250.3 & FLUCTUATING SUGAR   oxyCODONE-acetaminophen (PERCOCET/ROXICET) 5-325 MG tablet Take 1 tablet by mouth every 6 (six) hours as needed for severe pain.   pyridOXINE (VITAMIN B-6) 100 MG tablet Take 100 mg by mouth daily.   zinc gluconate 50 MG tablet Take 50 mg by mouth daily.   [DISCONTINUED] atorvastatin (LIPITOR) 40 MG tablet TAKE 1 TABLET BY MOUTH EVERY DAY   [DISCONTINUED] benazepril (LOTENSIN) 40 MG tablet Take 1 tablet (40 mg total) by mouth daily.   [DISCONTINUED] hydrochlorothiazide (HYDRODIURIL) 25 MG tablet Take 1 tablet (25 mg total) by mouth daily.   atorvastatin (LIPITOR) 40 MG tablet Take 1 tablet (40 mg total) by mouth daily.   benazepril (LOTENSIN) 40 MG tablet Take 1 tablet (40 mg total) by mouth daily.   hydrochlorothiazide (HYDRODIURIL) 25 MG tablet Take 1 tablet (25 mg total) by mouth daily.   No facility-administered encounter medications on file as of 11/11/2022.    Surgical History: Past Surgical History:  Procedure Laterality Date   CARPAL TUNNEL RELEASE Right 1980's   CARPAL TUNNEL RELEASE Left 05/05/2016   Procedure: CARPAL TUNNEL RELEASE;  Surgeon: Kennedy Bucker, MD;  Location: [facility];  Service: Orthopedics;  Laterality: Left;   CATARACT EXTRACTION W/PHACO Left 05/12/2015   Procedure: CATARACT EXTRACTION PHACO AND INTRAOCULAR LENS PLACEMENT (IOC);  Surgeon: Galen Manila, MD;  Location: [facility];  Service:  Ophthalmology;  Laterality: Left;  [place] 01:53.1AP% 21.4CDE 24.44fluid pack lot # [id number]   CATARACT EXTRACTION W/PHACO Right 07/02/2019   Procedure: CATARACT EXTRACTION PHACO AND INTRAOCULAR LENS PLACEMENT (IOC) RIGHT DIABETIC  01:53.2  18.9%  21.43;  Surgeon: Birder Robson, MD;  Location: [facility];  Service: Ophthalmology;  Laterality: Right;  Diabetic - insulin   EYE SURGERY Left    cataract extractions   INCISION AND DRAINAGE OF WOUND Left 1988   "staph infection" left leg   INTRAMEDULLARY (IM) NAIL INTERTROCHANTERIC Right  01/22/2018   Procedure: INTRAMEDULLARY (IM) NAIL INTERTROCHANTRIC;  Surgeon: Thornton Park, MD;  Location: [facility];  Service: Orthopedics;  Laterality: Right;   KNEE ARTHROPLASTY Left 12/28/2015   Procedure: COMPUTER ASSISTED TOTAL KNEE ARTHROPLASTY;  Surgeon: Dereck Leep, MD;  Location: [facility];  Service: Orthopedics;  Laterality: Left;   ORIF WRIST FRACTURE Left 05/05/2016   Procedure: OPEN REDUCTION INTERNAL FIXATION (ORIF) WRIST FRACTURE;  Surgeon: Hessie Knows, MD;  Location: [facility];  Service: Orthopedics;  Laterality: Left;    Medical History: Past Medical History:  Diagnosis Date   Agent orange exposure    [place] war exposure    Arthritis    "left knee" (03/18/2014)   Closed pelvic fracture ([place]) 03/18/2014   "multiple S/P fall w/loss of consciousness; CBG 29"   Encephalopathy acute 08/16/2013   GERD (gastroesophageal reflux disease)    High cholesterol    History of use of hearing aid in both ears    does not currently wear   Hypertension    Hypothyroidism    Osteopenia    Type II diabetes mellitus ([place])     Family History: Family History  Problem Relation Age of Onset   Colon cancer Mother    Cerebral aneurysm Father    Prostate cancer Neg Hx    Bladder Cancer Neg Hx    Kidney cancer Neg Hx     Social History   Socioeconomic History   Marital status: Married    Spouse name: Not on file   Number of children: Not on file   Years of education: Not on file   Highest education level: Not on file  Occupational History   Not on file  Tobacco Use   Smoking status: Former    Packs/day: 3.00    Years: 30.00    Total pack years: 90.00    Types: Cigarettes    Quit date: 24    Years since quitting: 36.0   Smokeless tobacco: Never   Tobacco comments:    "quit smoking in 1988"  Vaping Use   Vaping Use: Never used  Substance and Sexual Activity   Alcohol use: No   Drug use: No   Sexual activity: Never  Other Topics Concern   Not on file  Social  History Narrative   Not on file   Social Determinants of Health   Financial Resource Strain: Not on file  Food Insecurity: Not on file  Transportation Needs: Not on file  Physical Activity: Not on file  Stress: Not on file  Social Connections: Not on file  Intimate Partner Violence: Not on file      Review of Systems  Constitutional:  Negative for chills, fatigue and unexpected weight change.  HENT:  Negative for congestion, rhinorrhea, sneezing and sore throat.   Eyes:  Negative for redness.  Respiratory:  Negative for cough, chest tightness and shortness of breath.   Cardiovascular:  Negative for chest pain and palpitations.  Gastrointestinal:  Negative for abdominal pain, constipation, diarrhea, nausea and vomiting.  Genitourinary:  Negative for dysuria and frequency.  Musculoskeletal:  Negative for arthralgias, back pain, joint swelling and neck pain.       Partial amputation of left third finger  Skin:  Negative for rash.  Neurological: Negative.  Negative for tremors and numbness.  Hematological:  Negative for adenopathy. Does not bruise/bleed easily.  Psychiatric/Behavioral:  Negative for behavioral problems (Depression), sleep disturbance and suicidal ideas. The patient is not nervous/anxious.     Vital Signs: BP 132/62 Comment: 147/68  Pulse 99   Temp 98.3 F (36.8 C)   Resp 16   Ht 5\' 11"  (1.803 m)   Wt 245 lb (111.1 kg)   SpO2 94%   BMI 34.17 kg/m    Physical Exam Vitals reviewed.  Constitutional:      General: He is not in acute distress.    Appearance: Normal appearance. He is obese. He is not ill-appearing.  HENT:     Head: Normocephalic and atraumatic.  Eyes:     Pupils: Pupils are equal, round, and reactive to light.  Cardiovascular:     Rate and Rhythm: Normal rate and regular rhythm.     Heart sounds: Normal heart sounds. No murmur heard. Pulmonary:     Effort: Pulmonary effort is normal. No respiratory distress.     Breath sounds: Normal  breath sounds. No wheezing.  Neurological:     Mental Status: He is alert and oriented to person, place, and time.  Psychiatric:        Mood and Affect: Mood normal.        Behavior: Behavior normal.       Assessment/Plan: 1. Type 2 diabetes mellitus with hyperglycemia, with long-term current use of insulin (HCC) A1c slightly increased from previous, repeat A1c in 3 months - POCT glycosylated hemoglobin (Hb A1C) - Urine Microalbumin w/creat. ratio  2. Essential hypertension Stable, continue medications as prescribed. Refills ordered. - benazepril (LOTENSIN) 40 MG tablet; Take 1 tablet (40 mg total) by mouth daily.  Dispense: 90 tablet; Refill: 2 - hydrochlorothiazide (HYDRODIURIL) 25 MG tablet; Take 1 tablet (25 mg total) by mouth daily.  Dispense: 90 tablet; Refill: 2  3. Mixed hyperlipidemia Continue atorvastatin as prescribed - atorvastatin (LIPITOR) 40 MG tablet; Take 1 tablet (40 mg total) by mouth daily.  Dispense: 90 tablet; Refill: 1   General Counseling: Malva Cogan understanding of the findings of todays visit and agrees with plan of treatment. I have discussed any further diagnostic evaluation that may be needed or ordered today. We also reviewed his medications today. he has been encouraged to call the office with any questions or concerns that should arise related to todays visit.    Orders Placed This Encounter  Procedures   POCT glycosylated hemoglobin (Hb A1C)    Meds ordered this encounter  Medications   atorvastatin (LIPITOR) 40 MG tablet    Sig: Take 1 tablet (40 mg total) by mouth daily.    Dispense:  90 tablet    Refill:  1    For future refills   benazepril (LOTENSIN) 40 MG tablet    Sig: Take 1 tablet (40 mg total) by mouth daily.    Dispense:  90 tablet    Refill:  2    For future refills   hydrochlorothiazide (HYDRODIURIL) 25 MG tablet    Sig: Take 1 tablet (25 mg total) by mouth daily.    Dispense:  90 tablet  Refill:  2    For  future refills    Return in about 3 months (around 02/14/2023) for F/U, Recheck A1C, Leopold Smyers PCP.   Total time spent:30 Minutes Time spent includes review of chart, medications, test results, and follow up plan with the patient.    Controlled Substance Database was reviewed by me.  This patient was seen by Sallyanne Kuster, FNP-C in collaboration with Dr. Beverely Risen as a part of collaborative care agreement.   Carrol Bondar R. Tedd Sias, MSN, FNP-C Internal medicine

## 2022-11-13 LAB — MICROALBUMIN / CREATININE URINE RATIO
Creatinine, Urine: 242.3 mg/dL
Microalb/Creat Ratio: 13 mg/g creat (ref 0–29)
Microalbumin, Urine: 32.2 ug/mL

## 2022-11-18 DIAGNOSIS — S68121A Partial traumatic metacarpophalangeal amputation of left index finger, initial encounter: Secondary | ICD-10-CM | POA: Diagnosis not present

## 2022-12-22 DIAGNOSIS — E785 Hyperlipidemia, unspecified: Secondary | ICD-10-CM | POA: Diagnosis not present

## 2022-12-22 DIAGNOSIS — E0520 Thyrotoxicosis with toxic multinodular goiter without thyrotoxic crisis or storm: Secondary | ICD-10-CM | POA: Diagnosis not present

## 2022-12-22 DIAGNOSIS — E890 Postprocedural hypothyroidism: Secondary | ICD-10-CM | POA: Diagnosis not present

## 2022-12-22 DIAGNOSIS — I10 Essential (primary) hypertension: Secondary | ICD-10-CM | POA: Diagnosis not present

## 2022-12-22 DIAGNOSIS — E049 Nontoxic goiter, unspecified: Secondary | ICD-10-CM | POA: Diagnosis not present

## 2022-12-22 DIAGNOSIS — E063 Autoimmune thyroiditis: Secondary | ICD-10-CM | POA: Diagnosis not present

## 2022-12-29 DIAGNOSIS — I10 Essential (primary) hypertension: Secondary | ICD-10-CM | POA: Diagnosis not present

## 2022-12-29 DIAGNOSIS — E063 Autoimmune thyroiditis: Secondary | ICD-10-CM | POA: Diagnosis not present

## 2022-12-29 DIAGNOSIS — E0520 Thyrotoxicosis with toxic multinodular goiter without thyrotoxic crisis or storm: Secondary | ICD-10-CM | POA: Diagnosis not present

## 2022-12-29 DIAGNOSIS — Z6835 Body mass index (BMI) 35.0-35.9, adult: Secondary | ICD-10-CM | POA: Diagnosis not present

## 2022-12-29 DIAGNOSIS — E6609 Other obesity due to excess calories: Secondary | ICD-10-CM | POA: Diagnosis not present

## 2022-12-29 DIAGNOSIS — E1165 Type 2 diabetes mellitus with hyperglycemia: Secondary | ICD-10-CM | POA: Diagnosis not present

## 2022-12-29 DIAGNOSIS — E049 Nontoxic goiter, unspecified: Secondary | ICD-10-CM | POA: Diagnosis not present

## 2022-12-29 DIAGNOSIS — E785 Hyperlipidemia, unspecified: Secondary | ICD-10-CM | POA: Diagnosis not present

## 2023-01-09 DIAGNOSIS — B351 Tinea unguium: Secondary | ICD-10-CM | POA: Diagnosis not present

## 2023-01-09 DIAGNOSIS — E119 Type 2 diabetes mellitus without complications: Secondary | ICD-10-CM | POA: Diagnosis not present

## 2023-02-13 ENCOUNTER — Ambulatory Visit: Payer: Medicare HMO | Admitting: Nurse Practitioner

## 2023-02-14 ENCOUNTER — Ambulatory Visit (INDEPENDENT_AMBULATORY_CARE_PROVIDER_SITE_OTHER): Payer: Medicare HMO | Admitting: Nurse Practitioner

## 2023-02-14 ENCOUNTER — Encounter: Payer: Self-pay | Admitting: Nurse Practitioner

## 2023-02-14 VITALS — BP 140/64 | HR 87 | Temp 98.2°F | Resp 16 | Ht 71.0 in | Wt 246.6 lb

## 2023-02-14 DIAGNOSIS — I10 Essential (primary) hypertension: Secondary | ICD-10-CM

## 2023-02-14 DIAGNOSIS — Z794 Long term (current) use of insulin: Secondary | ICD-10-CM | POA: Diagnosis not present

## 2023-02-14 DIAGNOSIS — E1165 Type 2 diabetes mellitus with hyperglycemia: Secondary | ICD-10-CM

## 2023-02-14 DIAGNOSIS — E782 Mixed hyperlipidemia: Secondary | ICD-10-CM

## 2023-02-14 LAB — POCT GLYCOSYLATED HEMOGLOBIN (HGB A1C): Hemoglobin A1C: 6.4 % — AB (ref 4.0–5.6)

## 2023-02-14 NOTE — Progress Notes (Signed)
[facility] [address] [place], [place] [address]  Internal MEDICINE  Office Visit Note  Patient Name: [name]  [id number]  [id number]  Date of Service: 02/14/2023  Chief Complaint  Patient presents with   Diabetes   Gastroesophageal Reflux   Hypertension   Hyperlipidemia   Follow-up    HPI [name] presents for a follow-up visit for diabetes, hypertension and high cholesterol.  Diabetes -- A1c has improved to 6.4 from 7.3 in January.  BP is controlled. On several medications  Takes atorvastatin for high cholesterol.     Current Medication: Outpatient Encounter Medications as of 02/14/2023  Medication Sig   albuterol (VENTOLIN HFA) 108 (90 Base) MCG/ACT inhaler Inhale 2 puffs into the lungs every 6 (six) hours as needed for wheezing or shortness of breath.   ARMOUR THYROID 90 MG tablet Take 90 mg by mouth daily.   aspirin EC 81 MG tablet Take 81 mg by mouth daily.   atorvastatin (LIPITOR) 40 MG tablet Take 1 tablet (40 mg total) by mouth daily.   benazepril (LOTENSIN) 40 MG tablet Take 1 tablet (40 mg total) by mouth daily.   carvedilol (COREG) 6.25 MG tablet TAKE ONE TABLET BY MOUTH TWO TIMES A DAY HYPERTENSION   Cholecalciferol 1000 units capsule Take 1,000 Units by mouth daily.    Cinnamon 500 MG TABS Take 1,000 mg by mouth daily.   doxycycline (VIBRA-TABS) 100 MG tablet Take 1 tablet (100 mg total) by mouth 2 (two) times daily.   Glucosamine-Chondroitin 500-400 MG CAPS Take 3 tablets by mouth daily.   HUMULIN R 100 UNIT/ML injection INJECT 0.15-0.17 MLS (15-17 UNITS TOTAL) INTO THE SKIN 2 (TWO) TIMES DAILY BEFORE A MEAL. AND 16 UNITS IN THE MORNING AND PER SLIDING SCALE   hydrochlorothiazide (HYDRODIURIL) 25 MG tablet Take 1 tablet (25 mg total) by mouth daily.   insulin glargine-yfgn (SEMGLEE) 100 UNIT/ML Pen INJECT 57 UNITS UNDER SKIN EVERY DAY DISCARD PEN 28 DAYS AFTER OPENING.   insulin regular (HUMULIN R) 100 units/mL injection USE 17 UNITS  SUBCUTANEOUSLY TWICE DAILY WITH MEALS AND USE A SLIDING SCALE IF SUGARS GET HIGH   [place] Ginseng 1000 MG TABS Take 1,000 mg by mouth daily.   Omega 3 1200 MG CAPS Take 1,200 mg by mouth daily.   ONE TOUCH ULTRA TEST test strip CHECK SUGAR 5 X A DAY FOR DX 250.3 & FLUCTUATING SUGAR   oxyCODONE-acetaminophen (PERCOCET/ROXICET) 5-325 MG tablet Take 1 tablet by mouth every 6 (six) hours as needed for severe pain.   pyridOXINE (VITAMIN B-6) 100 MG tablet Take 100 mg by mouth daily.   zinc gluconate 50 MG tablet Take 50 mg by mouth daily.   No facility-administered encounter medications on file as of 02/14/2023.    Surgical History: Past Surgical History:  Procedure Laterality Date   CARPAL TUNNEL RELEASE Right 1980's   CARPAL TUNNEL RELEASE Left 05/05/2016   Procedure: CARPAL TUNNEL RELEASE;  Surgeon: Kennedy Bucker, MD;  Location: [facility];  Service: Orthopedics;  Laterality: Left;   CATARACT EXTRACTION W/PHACO Left 05/12/2015   Procedure: CATARACT EXTRACTION PHACO AND INTRAOCULAR LENS PLACEMENT (IOC);  Surgeon: Galen Manila, MD;  Location: [facility];  Service: Ophthalmology;  Laterality: Left;  [place] 01:53.1AP% 21.4CDE 24.66fluid pack lot # [id number]   CATARACT EXTRACTION W/PHACO Right 07/02/2019   Procedure: CATARACT EXTRACTION PHACO AND INTRAOCULAR LENS PLACEMENT (IOC) RIGHT DIABETIC  01:53.2  18.9%  21.43;  Surgeon: Galen Manila, MD;  Location: [facility];  Service: Ophthalmology;  Laterality: Right;  Diabetic - insulin   EYE SURGERY Left    cataract extractions   INCISION AND DRAINAGE OF WOUND Left 1988   "staph infection" left leg   INTRAMEDULLARY (IM) NAIL INTERTROCHANTERIC Right 01/22/2018   Procedure: INTRAMEDULLARY (IM) NAIL INTERTROCHANTRIC;  Surgeon: Juanell Fairly, MD;  Location: [facility];  Service: Orthopedics;  Laterality: Right;   KNEE ARTHROPLASTY Left 12/28/2015   Procedure: COMPUTER ASSISTED TOTAL KNEE ARTHROPLASTY;  Surgeon: Donato Heinz, MD;  Location: [facility];   Service: Orthopedics;  Laterality: Left;   ORIF WRIST FRACTURE Left 05/05/2016   Procedure: OPEN REDUCTION INTERNAL FIXATION (ORIF) WRIST FRACTURE;  Surgeon: Kennedy Bucker, MD;  Location: [facility];  Service: Orthopedics;  Laterality: Left;    Medical History: Past Medical History:  Diagnosis Date   Agent orange exposure    [place] war exposure    Arthritis    "left knee" (03/18/2014)   Closed pelvic fracture 03/18/2014   "multiple S/P fall w/loss of consciousness; CBG 29"   Encephalopathy acute 08/16/2013   GERD (gastroesophageal reflux disease)    High cholesterol    History of use of hearing aid in both ears    does not currently wear   Hypertension    Hypothyroidism    Osteopenia    Type II diabetes mellitus     Family History: Family History  Problem Relation Age of Onset   Colon cancer Mother    Cerebral aneurysm Father    Prostate cancer Neg Hx    Bladder Cancer Neg Hx    Kidney cancer Neg Hx     Social History   Socioeconomic History   Marital status: Married    Spouse name: Not on file   Number of children: Not on file   Years of education: Not on file   Highest education level: Not on file  Occupational History   Not on file  Tobacco Use   Smoking status: Former    Packs/day: 3.00    Years: 30.00    Additional pack years: 0.00    Total pack years: 90.00    Types: Cigarettes    Quit date: 4    Years since quitting: 36.3   Smokeless tobacco: Never   Tobacco comments:    "quit smoking in 1988"  Vaping Use   Vaping Use: Never used  Substance and Sexual Activity   Alcohol use: No   Drug use: No   Sexual activity: Never  Other Topics Concern   Not on file  Social History Narrative   Not on file   Social Determinants of Health   Financial Resource Strain: Not on file  Food Insecurity: Not on file  Transportation Needs: Not on file  Physical Activity: Not on file  Stress: Not on file  Social Connections: Not on file  Intimate Partner  Violence: Not on file      Review of Systems  Constitutional:  Negative for chills, fatigue and unexpected weight change.  HENT:  Negative for congestion, rhinorrhea, sneezing and sore throat.   Eyes:  Negative for redness.  Respiratory:  Negative for cough, chest tightness and shortness of breath.   Cardiovascular:  Negative for chest pain and palpitations.  Gastrointestinal:  Negative for abdominal pain, constipation, diarrhea, nausea and vomiting.  Genitourinary:  Negative for dysuria and frequency.  Musculoskeletal:  Negative for arthralgias, back pain, joint swelling and neck pain.       Partial amputation of left third finger  Skin:  Negative for rash.  Neurological: Negative.  Negative for tremors and numbness.  Hematological:  Negative for adenopathy. Does not bruise/bleed easily.  Psychiatric/Behavioral:  Negative for behavioral problems (Depression), sleep disturbance and suicidal ideas. The patient is not nervous/anxious.     Vital Signs: BP (!) 140/64   Pulse 87   Temp 98.2 F (36.8 C)   Resp 16   Ht 5\' 11"  (1.803 m)   Wt 246 lb 9.6 oz (111.9 kg)   SpO2 94%   BMI 34.39 kg/m    Physical Exam Vitals reviewed.  Constitutional:      General: He is not in acute distress.    Appearance: Normal appearance. He is obese. He is not ill-appearing.  HENT:     Head: Normocephalic and atraumatic.  Eyes:     Pupils: Pupils are equal, round, and reactive to light.  Cardiovascular:     Rate and Rhythm: Normal rate and regular rhythm.     Heart sounds: Normal heart sounds. No murmur heard. Pulmonary:     Effort: Pulmonary effort is normal. No respiratory distress.     Breath sounds: Normal breath sounds. No wheezing.  Neurological:     Mental Status: He is alert and oriented to person, place, and time.  Psychiatric:        Mood and Affect: Mood normal.        Behavior: Behavior normal.        Assessment/Plan: 1. Type 2 diabetes mellitus with hyperglycemia, with  long-term current use of insulin A1c has improved, no changes or interventions needed at this time  - POCT glycosylated hemoglobin (Hb A1C)  2. Essential hypertension Stable, continue medications as prescribed.   3. Mixed hyperlipidemia Continue atorvastatin as prescribed.    General Counseling: [name] understanding of the findings of todays visit and agrees with plan of treatment. I have discussed any further diagnostic evaluation that may be needed or ordered today. We also reviewed his medications today. he has been encouraged to call the office with any questions or concerns that should arise related to todays visit.    Orders Placed This Encounter  Procedures   POCT glycosylated hemoglobin (Hb A1C)    No orders of the defined types were placed in this encounter.   Return for previously scheduled, CPE, Recheck A1C, [name] PCP in august. .   Total time spent:30 Minutes Time spent includes review of chart, medications, test results, and follow up plan with the patient.   [place] Controlled Substance Database was reviewed by me.  This patient was seen by Sallyanne Kuster, FNP-C in collaboration with Dr. Beverely Risen as a part of collaborative care agreement.   Vallie Teters R. Tedd Sias, MSN, FNP-C Internal medicine

## 2023-02-25 ENCOUNTER — Encounter: Payer: Self-pay | Admitting: Nurse Practitioner

## 2023-03-09 DIAGNOSIS — E119 Type 2 diabetes mellitus without complications: Secondary | ICD-10-CM | POA: Diagnosis not present

## 2023-03-09 DIAGNOSIS — D3131 Benign neoplasm of right choroid: Secondary | ICD-10-CM | POA: Diagnosis not present

## 2023-03-09 DIAGNOSIS — H43813 Vitreous degeneration, bilateral: Secondary | ICD-10-CM | POA: Diagnosis not present

## 2023-04-17 DIAGNOSIS — E119 Type 2 diabetes mellitus without complications: Secondary | ICD-10-CM | POA: Diagnosis not present

## 2023-04-17 DIAGNOSIS — B351 Tinea unguium: Secondary | ICD-10-CM | POA: Diagnosis not present

## 2023-06-30 ENCOUNTER — Encounter: Payer: Self-pay | Admitting: Nurse Practitioner

## 2023-06-30 ENCOUNTER — Ambulatory Visit: Payer: Medicare HMO | Admitting: Nurse Practitioner

## 2023-06-30 VITALS — BP 120/68 | HR 75 | Temp 98.9°F | Resp 16 | Ht 71.0 in | Wt 236.8 lb

## 2023-06-30 DIAGNOSIS — Z0001 Encounter for general adult medical examination with abnormal findings: Secondary | ICD-10-CM

## 2023-06-30 DIAGNOSIS — E782 Mixed hyperlipidemia: Secondary | ICD-10-CM

## 2023-06-30 DIAGNOSIS — E1122 Type 2 diabetes mellitus with diabetic chronic kidney disease: Secondary | ICD-10-CM

## 2023-06-30 DIAGNOSIS — E039 Hypothyroidism, unspecified: Secondary | ICD-10-CM | POA: Diagnosis not present

## 2023-06-30 DIAGNOSIS — Z794 Long term (current) use of insulin: Secondary | ICD-10-CM | POA: Diagnosis not present

## 2023-06-30 DIAGNOSIS — E1165 Type 2 diabetes mellitus with hyperglycemia: Secondary | ICD-10-CM | POA: Diagnosis not present

## 2023-06-30 DIAGNOSIS — Z0000 Encounter for general adult medical examination without abnormal findings: Secondary | ICD-10-CM

## 2023-06-30 DIAGNOSIS — E559 Vitamin D deficiency, unspecified: Secondary | ICD-10-CM | POA: Diagnosis not present

## 2023-06-30 DIAGNOSIS — E538 Deficiency of other specified B group vitamins: Secondary | ICD-10-CM | POA: Diagnosis not present

## 2023-06-30 DIAGNOSIS — Z125 Encounter for screening for malignant neoplasm of prostate: Secondary | ICD-10-CM

## 2023-06-30 DIAGNOSIS — N182 Chronic kidney disease, stage 2 (mild): Secondary | ICD-10-CM

## 2023-06-30 DIAGNOSIS — I10 Essential (primary) hypertension: Secondary | ICD-10-CM

## 2023-06-30 LAB — POCT GLYCOSYLATED HEMOGLOBIN (HGB A1C): Hemoglobin A1C: 6.6 % — AB (ref 4.0–5.6)

## 2023-06-30 MED ORDER — BENAZEPRIL HCL 40 MG PO TABS: 40.0000 mg | ORAL_TABLET | Freq: Every day | ORAL | 2 refills | Status: DC

## 2023-06-30 MED ORDER — HYDROCHLOROTHIAZIDE 25 MG PO TABS: 25.0000 mg | ORAL_TABLET | Freq: Every day | ORAL | 2 refills | Status: DC

## 2023-06-30 MED ORDER — ATORVASTATIN CALCIUM 40 MG PO TABS: 40.0000 mg | ORAL_TABLET | Freq: Every day | ORAL | 1 refills | Status: DC

## 2023-06-30 NOTE — Progress Notes (Signed)
[facility] [address] [place], [place] [address]  Internal MEDICINE  Office Visit Note  Patient Name: [name] DOB:  [id number]  MRN: [id number]  Date of Service: 06/30/2023  PCP: Sallyanne Kuster, NP Podiatrist: Dr. Linus Galas Ophthalmologist: Dr. Galen Manila [facility] doctor: Dr. Ula Lingo Orthopedic: Dr. Deeann Saint   Chief Complaint  Patient presents with   Diabetes   Gastroesophageal Reflux   Hypertension   Hyperlipidemia   Medicare Wellness    HPI [name] presents for an annual well visit and physical exam.  Well-appearing 82 y.o. male with hypertension, GERD, hypothyroidism, diabetes, CKD stage 3,  Eye exam: 03/09/2023 foot exam:04/17/2023 Labs: overdue for labs  New or worsening pain: none  [facility] to levothyroxine.  No longer seeing endocrinology.  A1c is slightly improved at 6.6 today.      06/30/2023   11:11 AM 06/24/2022   11:01 AM 06/23/2021    8:58 AM  MMSE - Mini Mental State Exam  Not completed:  Unable to complete   Orientation to time 5  5  Orientation to Place 0  5  Registration 3  3  Attention/ Calculation 5  5  Recall 3  3  Language- name 2 objects 2  2  Language- repeat 1  1  Language- follow 3 step command 3  3  Language- read & follow direction 1  1  Write a sentence 1  1  Copy design 1  1  Total score 25  30    Functional Status Survey: Is the patient deaf or have difficulty hearing?: No Does the patient have difficulty seeing, even when wearing glasses/contacts?: No Does the patient have difficulty concentrating, remembering, or making decisions?: No Does the patient have difficulty walking or climbing stairs?: No Does the patient have difficulty dressing or bathing?: No Does the patient have difficulty doing errands alone such as visiting a doctor's office or shopping?: Yes     08/27/2022    7:15 PM 09/08/2022    4:40 PM 09/16/2022    8:57 AM 02/14/2023   10:40 AM  06/30/2023   11:09 AM  Fall Risk  Falls in the past year?   1 0 0  Was there an injury with Fall?   1 0 0  Fall Risk Category Calculator   3 0 0  Fall Risk Category (Retired)   High    (RETIRED) Patient Fall Risk Level Low fall risk Low fall risk High fall risk    Patient at Risk for Falls Due to   Impaired balance/gait No Fall Risks No Fall Risks  Fall risk Follow up   Falls evaluation completed Falls evaluation completed Falls evaluation completed       06/30/2023   11:09 AM  Depression screen PHQ 2/9  Decreased Interest 0  Down, Depressed, Hopeless 0  PHQ - 2 Score 0       Current Medication: Outpatient Encounter Medications as of 06/30/2023  Medication Sig   albuterol (VENTOLIN HFA) 108 (90 Base) MCG/ACT inhaler Inhale 2 puffs into the lungs every 6 (six) hours as needed for wheezing or shortness of breath.   alendronate (FOSAMAX) 70 MG tablet Take 70 mg by mouth once a week.   aspirin EC 81 MG tablet Take 81 mg by mouth daily.   carvedilol (COREG) 6.25 MG tablet TAKE ONE TABLET BY MOUTH TWO TIMES A DAY HYPERTENSION   Cholecalciferol 1000 units capsule Take 1,000 Units by mouth  daily.    Cinnamon 500 MG TABS Take 1,000 mg by mouth daily.   Glucosamine-Chondroitin 500-400 MG CAPS Take 3 tablets by mouth daily.   HUMULIN R 100 UNIT/ML injection INJECT 0.15-0.17 MLS (15-17 UNITS TOTAL) INTO THE SKIN 2 (TWO) TIMES DAILY BEFORE A MEAL. AND 16 UNITS IN THE MORNING AND PER SLIDING SCALE   insulin glargine-yfgn (SEMGLEE) 100 UNIT/ML Pen INJECT 57 UNITS UNDER SKIN EVERY DAY DISCARD PEN 28 DAYS AFTER OPENING.   insulin regular (HUMULIN R) 100 units/mL injection USE 17 UNITS SUBCUTANEOUSLY TWICE DAILY WITH MEALS AND USE A SLIDING SCALE IF SUGARS GET HIGH   [place] Ginseng 1000 MG TABS Take 1,000 mg by mouth daily.   levothyroxine (SYNTHROID) 125 MCG tablet Take 125 mcg by mouth daily before breakfast.   Omega 3 1200 MG CAPS Take 1,200 mg by mouth daily.   ONE TOUCH ULTRA TEST test strip  CHECK SUGAR 5 X A DAY FOR DX 250.3 & FLUCTUATING SUGAR   [DISCONTINUED] ARMOUR THYROID 90 MG tablet Take 90 mg by mouth daily.   [DISCONTINUED] atorvastatin (LIPITOR) 40 MG tablet Take 1 tablet (40 mg total) by mouth daily.   [DISCONTINUED] benazepril (LOTENSIN) 40 MG tablet Take 1 tablet (40 mg total) by mouth daily.   [DISCONTINUED] doxycycline (VIBRA-TABS) 100 MG tablet Take 1 tablet (100 mg total) by mouth 2 (two) times daily.   [DISCONTINUED] hydrochlorothiazide (HYDRODIURIL) 25 MG tablet Take 1 tablet (25 mg total) by mouth daily.   [DISCONTINUED] oxyCODONE-acetaminophen (PERCOCET/ROXICET) 5-325 MG tablet Take 1 tablet by mouth every 6 (six) hours as needed for severe pain.   [DISCONTINUED] pyridOXINE (VITAMIN B-6) 100 MG tablet Take 100 mg by mouth daily.   [DISCONTINUED] zinc gluconate 50 MG tablet Take 50 mg by mouth daily.   atorvastatin (LIPITOR) 40 MG tablet Take 1 tablet (40 mg total) by mouth daily.   benazepril (LOTENSIN) 40 MG tablet Take 1 tablet (40 mg total) by mouth daily.   hydrochlorothiazide (HYDRODIURIL) 25 MG tablet Take 1 tablet (25 mg total) by mouth daily.   No facility-administered encounter medications on file as of 06/30/2023.    Surgical History: Past Surgical History:  Procedure Laterality Date   CARPAL TUNNEL RELEASE Right 1980's   CARPAL TUNNEL RELEASE Left 05/05/2016   Procedure: CARPAL TUNNEL RELEASE;  Surgeon: Kennedy Bucker, MD;  Location: [facility];  Service: Orthopedics;  Laterality: Left;   CATARACT EXTRACTION W/PHACO Left 05/12/2015   Procedure: CATARACT EXTRACTION PHACO AND INTRAOCULAR LENS PLACEMENT (IOC);  Surgeon: Galen Manila, MD;  Location: [facility];  Service: Ophthalmology;  Laterality: Left;  [place] 01:53.1AP% 21.4CDE 24.110fluid pack lot # [id number]   CATARACT EXTRACTION W/PHACO Right 07/02/2019   Procedure: CATARACT EXTRACTION PHACO AND INTRAOCULAR LENS PLACEMENT (IOC) RIGHT DIABETIC  01:53.2  18.9%  21.43;  Surgeon: Galen Manila, MD;  [facility]  [facility];  Service: Ophthalmology;  Laterality: Right;  Diabetic - insulin   EYE SURGERY Left    cataract extractions   INCISION AND DRAINAGE OF WOUND Left 1988   "staph infection" left leg   INTRAMEDULLARY (IM) NAIL INTERTROCHANTERIC Right 01/22/2018   Procedure: INTRAMEDULLARY (IM) NAIL INTERTROCHANTRIC;  Surgeon: Juanell Fairly, MD;  Location: [facility];  Service: Orthopedics;  Laterality: Right;   KNEE ARTHROPLASTY Left 12/28/2015   Procedure: COMPUTER ASSISTED TOTAL KNEE ARTHROPLASTY;  Surgeon: Donato Heinz, MD;  Location: [facility];  Service: Orthopedics;  Laterality: Left;   ORIF WRIST FRACTURE Left 05/05/2016   Procedure: OPEN REDUCTION INTERNAL FIXATION (ORIF) WRIST  FRACTURE;  Surgeon: Kennedy Bucker, MD;  Location: [facility];  Service: Orthopedics;  Laterality: Left;    Medical History: Past Medical History:  Diagnosis Date   Agent orange exposure    [place] war exposure    Arthritis    "left knee" (03/18/2014)   Closed pelvic fracture (HCC) 03/18/2014   "multiple S/P fall w/loss of consciousness; CBG 29"   Encephalopathy acute 08/16/2013   GERD (gastroesophageal reflux disease)    High cholesterol    History of use of hearing aid in both ears    does not currently wear   Hypertension    Hypothyroidism    Osteopenia    Type II diabetes mellitus (HCC)     Family History: Family History  Problem Relation Age of Onset   Colon cancer Mother    Cerebral aneurysm Father    Prostate cancer Neg Hx    Bladder Cancer Neg Hx    Kidney cancer Neg Hx     Social History   Socioeconomic History   Marital status: Married    Spouse name: Not on file   Number of children: Not on file   Years of education: Not on file   Highest education level: Not on file  Occupational History   Not on file  Tobacco Use   Smoking status: Former    Current packs/day: 0.00    Average packs/day: 3.0 packs/day for 30.0 years (90.0 ttl pk-yrs)    Types: Cigarettes    Start date: 63     Quit date: 75    Years since quitting: 36.6   Smokeless tobacco: Never   Tobacco comments:    "quit smoking in 1988"  Vaping Use   Vaping status: Never Used  Substance and Sexual Activity   Alcohol use: No   Drug use: No   Sexual activity: Never  Other Topics Concern   Not on file  Social History Narrative   Not on file   Social Determinants of Health   Financial Resource Strain: Not on file  Food Insecurity: Not on file  Transportation Needs: Not on file  Physical Activity: Not on file  Stress: Not on file  Social Connections: Not on file  Intimate Partner Violence: Not on file      Review of Systems  Constitutional:  Negative for activity change, appetite change, chills, fatigue, fever and unexpected weight change.  HENT: Negative.  Negative for congestion, ear pain, rhinorrhea, sneezing, sore throat and trouble swallowing.   Eyes: Negative.  Negative for redness.  Respiratory: Negative.  Negative for cough, chest tightness, shortness of breath and wheezing.   Cardiovascular: Negative.  Negative for chest pain and palpitations.  Gastrointestinal: Negative.  Negative for abdominal pain, blood in stool, constipation, diarrhea, nausea and vomiting.  Endocrine: Negative.   Genitourinary: Negative.  Negative for difficulty urinating, dysuria, frequency, hematuria and urgency.  Musculoskeletal: Negative.  Negative for arthralgias, back pain, joint swelling, myalgias and neck pain.  Skin:  Negative for rash and wound.  Allergic/Immunologic: Negative.  Negative for immunocompromised state.  Neurological: Negative.  Negative for dizziness, tremors, seizures, numbness and headaches.  Hematological: Negative.  Negative for adenopathy. Does not bruise/bleed easily.  Psychiatric/Behavioral: Negative.  Negative for agitation, behavioral problems (Depression), self-injury, sleep disturbance and suicidal ideas. The patient is not nervous/anxious.     Vital Signs: BP 120/68    Pulse 75   Temp 98.9 F (37.2 C)   Resp 16   Ht 5\' 11"  (1.803 m)   Wt  236 lb 12.8 oz (107.4 kg)   SpO2 95%   BMI 33.03 kg/m    Physical Exam Vitals reviewed.  Constitutional:      General: [name] is not in acute distress.    Appearance: Normal appearance. [name] is well-developed. [name] is obese. [name] is not ill-appearing or diaphoretic.  HENT:     Head: Normocephalic and atraumatic.     Right Ear: Tympanic membrane, ear canal and external ear normal.     Left Ear: Tympanic membrane, ear canal and external ear normal.     Nose: Nose normal. No congestion or rhinorrhea.     Mouth/Throat:     Mouth: Mucous membranes are moist.     Pharynx: Oropharynx is clear. No oropharyngeal exudate or posterior oropharyngeal erythema.  Eyes:     General: No scleral icterus.       Right eye: No discharge.        Left eye: No discharge.     Extraocular Movements: Extraocular movements intact.     Conjunctiva/sclera: Conjunctivae normal.     Pupils: Pupils are equal, round, and reactive to light.  Neck:     Thyroid: No thyromegaly.     Vascular: No carotid bruit or JVD.     Trachea: No tracheal deviation.  Cardiovascular:     Rate and Rhythm: Normal rate and regular rhythm.     Heart sounds: Normal heart sounds. No murmur heard.    No friction rub. No gallop.  Pulmonary:     Effort: Pulmonary effort is normal. No respiratory distress.     Breath sounds: Normal breath sounds. No stridor. No wheezing or rales.  Chest:     Chest wall: No tenderness.  Abdominal:     General: Bowel sounds are normal. There is no distension.     Palpations: Abdomen is soft. There is no mass.     Tenderness: There is no abdominal tenderness. There is no guarding or rebound.  Musculoskeletal:        General: No tenderness or deformity. Normal range of motion.     Cervical back: Normal range of motion and neck supple.  Lymphadenopathy:     Cervical: No cervical adenopathy.  Skin:    General: Skin is warm and dry.      Capillary Refill: Capillary refill takes less than 2 seconds.     Coloration: Skin is not pale.  Neurological:     Mental Status: [name] is alert and oriented to person, place, and time.     Cranial Nerves: No cranial nerve deficit.     Motor: No abnormal muscle tone.     Coordination: Coordination normal.     Gait: Gait normal.     Deep Tendon Reflexes: Reflexes are normal and symmetric.  Psychiatric:        Mood and Affect: Mood normal.        Behavior: Behavior normal.        Thought Content: Thought content normal.        Judgment: Judgment normal.        Assessment/Plan: 1. Encounter for subsequent annual wellness visit in Medicare patient Age-appropriate preventive screenings and vaccinations discussed, annual physical exam completed. Routine labs for health maintenance ordered, see below. Refills ordered as well. PHM updated.  - alendronate (FOSAMAX) 70 MG tablet; Take 70 mg by mouth once a week. - Lipid Profile - CMP14+EGFR - Vitamin D (25 hydroxy) - Iron, TIBC and Ferritin Panel - B12 and Folate Panel  2. Type  2 diabetes mellitus with stage 2 chronic kidney disease, with long-term current use of insulin (HCC) A1c stable, continue medications as prescribed, routine labs ordered  - POCT glycosylated hemoglobin (Hb A1C) - Lipid Profile - CMP14+EGFR - Vitamin D (25 hydroxy) - Iron, TIBC and Ferritin Panel - B12 and Folate Panel  3. CKD stage 2 due to type 2 diabetes mellitus (HCC) Routine labs ordered  - Lipid Profile - CMP14+EGFR - Vitamin D (25 hydroxy) - Iron, TIBC and Ferritin Panel - B12 and Folate Panel  4. Acquired hypothyroidism Continue levothyroxine as prescribed. Routine labs ordered - levothyroxine (SYNTHROID) 125 MCG tablet; Take 125 mcg by mouth daily before breakfast. - Lipid Profile - CMP14+EGFR - Vitamin D (25 hydroxy) - Iron, TIBC and Ferritin Panel - B12 and Folate Panel  5. Essential hypertension Stable, continue medications as prescribed,  refills ordered - benazepril (LOTENSIN) 40 MG tablet; Take 1 tablet (40 mg total) by mouth daily.  Dispense: 90 tablet; Refill: 2 - hydrochlorothiazide (HYDRODIURIL) 25 MG tablet; Take 1 tablet (25 mg total) by mouth daily.  Dispense: 90 tablet; Refill: 2  6. Mixed hyperlipidemia Continue atorvastatin as prescribed and routine labs ordered  - Lipid Profile - CMP14+EGFR - Vitamin D (25 hydroxy) - Iron, TIBC and Ferritin Panel - B12 and Folate Panel - atorvastatin (LIPITOR) 40 MG tablet; Take 1 tablet (40 mg total) by mouth daily.  Dispense: 90 tablet; Refill: 1  7. B12 deficiency Routine labs ordered  - Lipid Profile - CMP14+EGFR - Vitamin D (25 hydroxy) - Iron, TIBC and Ferritin Panel - B12 and Folate Panel  8. Vitamin D deficiency Routine labs ordered  - Lipid Profile - CMP14+EGFR - Vitamin D (25 hydroxy) - Iron, TIBC and Ferritin Panel - B12 and Folate Panel  9. Prostate cancer screening Routine lab ordered  - PSA Total (Reflex To Free)     General Counseling: Joaquim Nam understanding of the findings of todays visit and agrees with plan of treatment. I have discussed any further diagnostic evaluation that may be needed or ordered today. We also reviewed his medications today. [name] has been encouraged to call the office with any questions or concerns that should arise related to todays visit.    Orders Placed This Encounter  Procedures   Lipid Profile   CMP14+EGFR   Vitamin D (25 hydroxy)   PSA Total (Reflex To Free)   Iron, TIBC and Ferritin Panel   B12 and Folate Panel   POCT glycosylated hemoglobin (Hb A1C)    Meds ordered this encounter  Medications   benazepril (LOTENSIN) 40 MG tablet    Sig: Take 1 tablet (40 mg total) by mouth daily.    Dispense:  90 tablet    Refill:  2    For future refills   atorvastatin (LIPITOR) 40 MG tablet    Sig: Take 1 tablet (40 mg total) by mouth daily.    Dispense:  90 tablet    Refill:  1    For future refills    hydrochlorothiazide (HYDRODIURIL) 25 MG tablet    Sig: Take 1 tablet (25 mg total) by mouth daily.    Dispense:  90 tablet    Refill:  2    For future refills    Return in about 4 months (around 10/30/2023) for F/U, Recheck A1C, Aletha Allebach PCP.   Total time spent:30 Minutes Time spent includes review of chart, medications, test results, and follow up plan with the patient.   [place] Controlled Substance Database  was reviewed by me.  This patient was seen by Sallyanne Kuster, FNP-C in collaboration with Dr. Beverely Risen as a part of collaborative care agreement.  Davonne Jarnigan R. Tedd Sias, MSN, FNP-C Internal medicine

## 2023-07-06 DIAGNOSIS — S68121A Partial traumatic metacarpophalangeal amputation of left index finger, initial encounter: Secondary | ICD-10-CM | POA: Diagnosis not present

## 2023-07-24 DIAGNOSIS — E119 Type 2 diabetes mellitus without complications: Secondary | ICD-10-CM | POA: Diagnosis not present

## 2023-07-24 DIAGNOSIS — B351 Tinea unguium: Secondary | ICD-10-CM | POA: Diagnosis not present

## 2023-08-11 ENCOUNTER — Encounter: Payer: Self-pay | Admitting: Nurse Practitioner

## 2023-11-06 ENCOUNTER — Encounter: Payer: Self-pay | Admitting: Nurse Practitioner

## 2023-11-06 ENCOUNTER — Ambulatory Visit: Payer: Medicare Other | Admitting: Nurse Practitioner

## 2023-11-06 VITALS — BP 132/66 | HR 81 | Temp 98.3°F | Resp 16 | Ht 71.0 in | Wt 231.8 lb

## 2023-11-06 DIAGNOSIS — E1159 Type 2 diabetes mellitus with other circulatory complications: Secondary | ICD-10-CM | POA: Diagnosis not present

## 2023-11-06 DIAGNOSIS — E785 Hyperlipidemia, unspecified: Secondary | ICD-10-CM | POA: Diagnosis not present

## 2023-11-06 DIAGNOSIS — E1122 Type 2 diabetes mellitus with diabetic chronic kidney disease: Secondary | ICD-10-CM | POA: Diagnosis not present

## 2023-11-06 DIAGNOSIS — Z794 Long term (current) use of insulin: Secondary | ICD-10-CM

## 2023-11-06 DIAGNOSIS — E1169 Type 2 diabetes mellitus with other specified complication: Secondary | ICD-10-CM

## 2023-11-06 DIAGNOSIS — I152 Hypertension secondary to endocrine disorders: Secondary | ICD-10-CM

## 2023-11-06 LAB — POCT GLYCOSYLATED HEMOGLOBIN (HGB A1C): Hemoglobin A1C: 6.8 % — AB (ref 4.0–5.6)

## 2023-11-06 MED ORDER — ATORVASTATIN CALCIUM 40 MG PO TABS: 40.0000 mg | ORAL_TABLET | Freq: Every day | ORAL | 1 refills | Status: DC

## 2023-11-06 NOTE — Progress Notes (Signed)
 [facility] [address] [place], [place] [address]  Internal MEDICINE  Office Visit Note  Patient Name: [name]  [id number]  [id number]  Date of Service: 11/06/2023  Chief Complaint  Patient presents with   Diabetes   Gastroesophageal Reflux   Hypertension   Hyperlipidemia   Follow-up    HPI  [name] presents for a follow-up visit for diabetes and hypertension Diabetes -- A1c is 6.8, slightly elevated compared to prior but still at goal of under 7.0.  Seen at the [facility] in December. A1c was 6.5 then.  - on insulin  glargine 52 units QHS (decreased by Endo 12/2) and insulin  regular  12 units BID with meals - he has CGM and glucometer, which he gets through the [facility] - he denies any sxs of hyper or hypoglycemia - no changes to his diet  2. Hypertension:  - on benazepril  40 mg daily, HCTZ 25 mg daily, carvedilol 6.25 mg BID - BP is stable today  - unable to monitor blood pressure at home, does not have a BP machine  - he denies any CP, SOB/DOE, HA, abd pain, N/V, LE edema or orthopnea  3. Hyperlipidemia -- takes atorvastatin  and a fish oil  supplement  Current Medication: Outpatient Encounter Medications as of 11/06/2023  Medication Sig   albuterol  (VENTOLIN  HFA) 108 (90 Base) MCG/ACT inhaler Inhale 2 puffs into the lungs every 6 (six) hours as needed for wheezing or shortness of breath.   alendronate (FOSAMAX) 70 MG tablet Take 70 mg by mouth once a week.   aspirin  EC 81 MG tablet Take 81 mg by mouth daily.   benazepril  (LOTENSIN ) 40 MG tablet Take 1 tablet (40 mg total) by mouth daily.   carvedilol (COREG) 6.25 MG tablet TAKE ONE TABLET BY MOUTH TWO TIMES A DAY HYPERTENSION   Cholecalciferol  1000 units capsule Take 1,000 Units by mouth daily.    Cinnamon 500 MG TABS Take 1,000 mg by mouth daily.   Glucosamine-Chondroitin 500-400 MG CAPS Take 3 tablets by mouth daily.   HUMULIN  R 100 UNIT/ML injection INJECT 0.15-0.17 MLS (15-17 UNITS TOTAL) INTO THE SKIN 2 (TWO)  TIMES DAILY BEFORE A MEAL. AND 16 UNITS IN THE MORNING AND PER SLIDING SCALE   hydrochlorothiazide  (HYDRODIURIL ) 25 MG tablet Take 1 tablet (25 mg total) by mouth daily.   insulin  glargine-yfgn (SEMGLEE ) 100 UNIT/ML Pen INJECT 57 UNITS UNDER SKIN EVERY DAY DISCARD PEN 28 DAYS AFTER OPENING.   insulin  regular (HUMULIN  R) 100 units/mL injection USE 17 UNITS SUBCUTANEOUSLY TWICE DAILY WITH MEALS AND USE A SLIDING SCALE IF SUGARS GET HIGH   [place]orean Ginseng 1000 MG TABS Take 1,000 mg by mouth daily.   levothyroxine (SYNTHROID) 125 MCG tablet Take 125 mcg by mouth daily before breakfast.   Omega 3 1200 MG CAPS Take 1,200 mg by mouth daily.   ONE TOUCH ULTRA TEST test strip CHECK SUGAR 5 X A DAY FOR DX 250.3 & FLUCTUATING SUGAR   [DISCONTINUED] atorvastatin  (LIPITOR) 40 MG tablet Take 1 tablet (40 mg total) by mouth daily.   atorvastatin  (LIPITOR) 40 MG tablet Take 1 tablet (40 mg total) by mouth daily.   No facility-administered encounter medications on file as of 11/06/2023.    Surgical History: Past Surgical History:  Procedure Laterality Date   CARPAL TUNNEL RELEASE Right 1980's   CARPAL TUNNEL RELEASE Left 05/05/2016   Procedure: CARPAL TUNNEL RELEASE;  Surgeon: Ozell Flake, MD;  Location: [facility];  Service: Orthopedics;  Laterality: Left;   CATARACT  EXTRACTION W/PHACO Left 05/12/2015   Procedure: CATARACT EXTRACTION PHACO AND INTRAOCULAR LENS PLACEMENT (IOC);  Surgeon: Elsie Carmine, MD;  Location: [facility];  Service: Ophthalmology;  Laterality: Left;  US  01:53.1AP% 21.4CDE 24.40fluid pack lot # [id number]   CATARACT EXTRACTION W/PHACO Right 07/02/2019   Procedure: CATARACT EXTRACTION PHACO AND INTRAOCULAR LENS PLACEMENT (IOC) RIGHT DIABETIC  01:53.2  18.9%  21.43;  Surgeon: Carmine Elsie, MD;  Location: [facility];  Service: Ophthalmology;  Laterality: Right;  Diabetic - insulin    EYE SURGERY Left    cataract extractions   INCISION AND DRAINAGE OF WOUND Left 1988   staph  infection left leg   INTRAMEDULLARY (IM) NAIL INTERTROCHANTERIC Right 01/22/2018   Procedure: INTRAMEDULLARY (IM) NAIL INTERTROCHANTRIC;  Surgeon: Marchia Drivers, MD;  Location: [facility];  Service: Orthopedics;  Laterality: Right;   KNEE ARTHROPLASTY Left 12/28/2015   Procedure: COMPUTER ASSISTED TOTAL KNEE ARTHROPLASTY;  Surgeon: Lynwood SHAUNNA Hue, MD;  Location: [facility];  Service: Orthopedics;  Laterality: Left;   ORIF WRIST FRACTURE Left 05/05/2016   Procedure: OPEN REDUCTION INTERNAL FIXATION (ORIF) WRIST FRACTURE;  Surgeon: Ozell Flake, MD;  Location: [facility];  Service: Orthopedics;  Laterality: Left;    Medical History: Past Medical History:  Diagnosis Date   Agent orange exposure    [place] war exposure    Arthritis    left knee (03/18/2014)   Closed pelvic fracture (HCC) 03/18/2014   multiple S/P fall w/loss of consciousness; CBG 29   Encephalopathy acute 08/16/2013   GERD (gastroesophageal reflux disease)    High cholesterol    History of use of hearing aid in both ears    does not currently wear   Hypertension    Hypothyroidism    Osteopenia    Type II diabetes mellitus (HCC)     Family History: Family History  Problem Relation Age of Onset   Colon cancer Mother    Cerebral aneurysm Father    Prostate cancer Neg Hx    Bladder Cancer Neg Hx    Kidney cancer Neg Hx     Social History   Socioeconomic History   Marital status: Married    Spouse name: Not on file   Number of children: Not on file   Years of education: Not on file   Highest education level: Not on file  Occupational History   Not on file  Tobacco Use   Smoking status: Former    Current packs/day: 0.00    Average packs/day: 3.0 packs/day for 30.0 years (90.0 ttl pk-yrs)    Types: Cigarettes    Start date: 26    Quit date: 40    Years since quitting: 37.0   Smokeless tobacco: Never   Tobacco comments:    quit smoking in 1988  Vaping Use   Vaping status: Never Used  Substance and  Sexual Activity   Alcohol use: No   Drug use: No   Sexual activity: Never  Other Topics Concern   Not on file  Social History Narrative   Not on file   Social Drivers of Health   Financial Resource Strain: Not on file  Food Insecurity: Not on file  Transportation Needs: Not on file  Physical Activity: Not on file  Stress: Not on file  Social Connections: Not on file  Intimate Partner Violence: Not on file      Review of Systems  Constitutional:  Negative for chills, fatigue and unexpected weight change.  HENT:  Negative for congestion, rhinorrhea, sneezing and  sore throat.   Eyes:  Negative for redness.  Respiratory:  Negative for cough, chest tightness and shortness of breath.   Cardiovascular:  Negative for chest pain and palpitations.  Gastrointestinal:  Negative for abdominal pain, constipation, diarrhea, nausea and vomiting.  Genitourinary:  Negative for dysuria and frequency.  Musculoskeletal:  Negative for arthralgias, back pain, joint swelling and neck pain.       Partial amputation of left third finger  Skin:  Negative for rash.  Neurological: Negative.  Negative for tremors and numbness.  Hematological:  Negative for adenopathy. Does not bruise/bleed easily.  Psychiatric/Behavioral:  Negative for behavioral problems (Depression), sleep disturbance and suicidal ideas. The patient is not nervous/anxious.     Vital Signs: BP 132/66   Pulse 81   Temp 98.3 F (36.8 C)   Resp 16   Ht 5' 11 (1.803 m)   Wt 231 lb 12.8 oz (105.1 kg)   SpO2 93%   BMI 32.33 kg/m    Physical Exam Vitals reviewed.  Constitutional:      General: He is not in acute distress.    Appearance: Normal appearance. He is obese. He is not ill-appearing.  HENT:     Head: Normocephalic and atraumatic.  Eyes:     Pupils: Pupils are equal, round, and reactive to light.  Cardiovascular:     Rate and Rhythm: Normal rate and regular rhythm.     Heart sounds: Normal heart sounds. No murmur  heard. Pulmonary:     Effort: Pulmonary effort is normal. No respiratory distress.     Breath sounds: Normal breath sounds. No wheezing.  Neurological:     Mental Status: He is alert and oriented to person, place, and time.  Psychiatric:        Mood and Affect: Mood normal.        Behavior: Behavior normal.        Assessment/Plan: 1. Type 2 diabetes mellitus with other specified complication, with long-term current use of insulin  (HCC) (Primary) Continue insulin  as prescribed by the [facility]. Continue monitoring glucose via CGM and as needed with glucose meter to verify high or low glucose readings on CGM.  - POCT glycosylated hemoglobin (Hb A1C) - Urine Microalbumin w/creat. ratio  2. Hypertension associated with diabetes (HCC) Stable, continue benazepril , hydrochlorothiazide  and carvedilol as prescribed.   3. Hyperlipidemia associated with type 2 diabetes mellitus (HCC) Continue atorvastatin  as prescribed.  - atorvastatin  (LIPITOR) 40 MG tablet; Take 1 tablet (40 mg total) by mouth daily.  Dispense: 90 tablet; Refill: 1   General Counseling: [name] understanding of the findings of todays visit and agrees with plan of treatment. I have discussed any further diagnostic evaluation that may be needed or ordered today. We also reviewed his medications today. he has been encouraged to call the office with any questions or concerns that should arise related to todays visit.    Orders Placed This Encounter  Procedures   POCT glycosylated hemoglobin (Hb A1C)    Meds ordered this encounter  Medications   atorvastatin  (LIPITOR) 40 MG tablet    Sig: Take 1 tablet (40 mg total) by mouth daily.    Dispense:  90 tablet    Refill:  1    For future refills    Return in about 4 months (around 03/05/2024) for F/U, Recheck A1C, Susumu Hackler PCP.   Total time spent:30 Minutes Time spent includes review of chart, medications, test results, and follow up plan with the patient.   [place]  Controlled Substance Database was reviewed by me.  This patient was seen by Mardy Maxin, FNP-C in collaboration with Dr. Sigrid Bathe as a part of collaborative care agreement.   [name] R. Maxin, MSN, FNP-C Internal medicine

## 2023-11-07 ENCOUNTER — Encounter: Payer: Self-pay | Admitting: Nurse Practitioner

## 2023-11-07 LAB — MICROALBUMIN / CREATININE URINE RATIO
Creatinine, Urine: 148.1 mg/dL
Microalb/Creat Ratio: 2 mg/g{creat} (ref 0–29)
Microalbumin, Urine: 3.1 ug/mL

## 2023-12-08 ENCOUNTER — Encounter: Payer: Self-pay | Admitting: Physician Assistant

## 2023-12-08 ENCOUNTER — Ambulatory Visit (INDEPENDENT_AMBULATORY_CARE_PROVIDER_SITE_OTHER): Payer: Medicare Other | Admitting: Physician Assistant

## 2023-12-08 VITALS — BP 155/80 | HR 83 | Temp 98.5°F | Resp 16 | Ht 71.0 in | Wt 239.0 lb

## 2023-12-08 DIAGNOSIS — S21201A Unspecified open wound of right back wall of thorax without penetration into thoracic cavity, initial encounter: Secondary | ICD-10-CM

## 2023-12-08 MED ORDER — DOXYCYCLINE HYCLATE 100 MG PO TABS: 100.0000 mg | ORAL_TABLET | Freq: Two times a day (BID) | ORAL | 0 refills | Status: DC

## 2023-12-08 NOTE — Progress Notes (Signed)
 [facility] [address] [place], [place] [address]  Internal MEDICINE  Office Visit Note  Patient Name: [name]  [id number]  [id number]  Date of Service: 12/20/2023  Chief Complaint  Patient presents with   Acute Visit   Sore    Right shoulder blade     HPI Pt is here for a sick visit. -Sore on back over right scapula -Started a month ago. No known injury.  -Using alcohol pad and bandaid.  -Now using triple ABX and has been improving now. He can't see it, but wife is present in office today and states it is definitely better since using triple ABX ointment and now starting to heal -states it is impacting his sleep, discussed avoiding sleeping on back to take pressure off area -he is diabetic and may contribute to delayed healing  Current Medication:  Outpatient Encounter Medications as of 12/08/2023  Medication Sig   albuterol  (VENTOLIN  HFA) 108 (90 Base) MCG/ACT inhaler Inhale 2 puffs into the lungs every 6 (six) hours as needed for wheezing or shortness of breath.   alendronate (FOSAMAX) 70 MG tablet Take 70 mg by mouth once a week.   aspirin  EC 81 MG tablet Take 81 mg by mouth daily.   atorvastatin  (LIPITOR) 40 MG tablet Take 1 tablet (40 mg total) by mouth daily.   benazepril  (LOTENSIN ) 40 MG tablet Take 1 tablet (40 mg total) by mouth daily.   carvedilol (COREG) 6.25 MG tablet TAKE ONE TABLET BY MOUTH TWO TIMES A DAY HYPERTENSION   Cholecalciferol  1000 units capsule Take 1,000 Units by mouth daily.    Cinnamon 500 MG TABS Take 1,000 mg by mouth daily.   doxycycline  (VIBRA -TABS) 100 MG tablet Take 1 tablet (100 mg total) by mouth 2 (two) times daily.   Glucosamine-Chondroitin 500-400 MG CAPS Take 3 tablets by mouth daily.   HUMULIN  R 100 UNIT/ML injection INJECT 0.15-0.17 MLS (15-17 UNITS TOTAL) INTO THE SKIN 2 (TWO) TIMES DAILY BEFORE A MEAL. AND 16 UNITS IN THE MORNING AND PER SLIDING SCALE   hydrochlorothiazide  (HYDRODIURIL ) 25 MG tablet Take 1  tablet (25 mg total) by mouth daily.   insulin  glargine-yfgn (SEMGLEE ) 100 UNIT/ML Pen INJECT 57 UNITS UNDER SKIN EVERY DAY DISCARD PEN 28 DAYS AFTER OPENING.   insulin  regular (HUMULIN  R) 100 units/mL injection USE 17 UNITS SUBCUTANEOUSLY TWICE DAILY WITH MEALS AND USE A SLIDING SCALE IF SUGARS GET HIGH   [place] Ginseng 1000 MG TABS Take 1,000 mg by mouth daily.   levothyroxine (SYNTHROID) 125 MCG tablet Take 125 mcg by mouth daily before breakfast.   Omega 3 1200 MG CAPS Take 1,200 mg by mouth daily.   ONE TOUCH ULTRA TEST test strip CHECK SUGAR 5 X A DAY FOR DX 250.3 & FLUCTUATING SUGAR   No facility-administered encounter medications on file as of 12/08/2023.      Medical History: Past Medical History:  Diagnosis Date   Agent orange exposure    [place] war exposure    Arthritis    left knee (03/18/2014)   Closed pelvic fracture (HCC) 03/18/2014   multiple S/P fall w/loss of consciousness; CBG 29   Encephalopathy acute 08/16/2013   GERD (gastroesophageal reflux disease)    High cholesterol    History of use of hearing aid in both ears    does not currently wear   Hypertension    Hypothyroidism    Osteopenia    Type II diabetes mellitus (HCC)      Vital Signs:  BP (!) 155/80   Pulse 83   Temp 98.5 F (36.9 C)   Resp 16   Ht 5' 11 (1.803 m)   Wt 239 lb (108.4 kg)   SpO2 93%   BMI 33.33 kg/m    Review of Systems  Constitutional:  Negative for fatigue and fever.  HENT:  Negative for congestion, mouth sores and postnasal drip.   Respiratory:  Negative for cough.   Cardiovascular:  Negative for chest pain.  Genitourinary:  Negative for flank pain.  Skin:  Positive for wound.  Psychiatric/Behavioral: Negative.      Physical Exam Vitals and nursing note reviewed.  Constitutional:      Appearance: Normal appearance.  HENT:     Head: Normocephalic and atraumatic.  Cardiovascular:     Rate and Rhythm: Normal rate and regular rhythm.  Pulmonary:     Effort:  Pulmonary effort is normal.     Breath sounds: Normal breath sounds.  Skin:    Findings: Lesion present.     Comments: Small open wound along right scapula, no drainage, erythema, or increased warmth surrounding wound. Signs of healing present  Neurological:     Mental Status: He is alert.       Assessment/Plan: 1. Wound of right side of back, initial encounter (Primary) Will continue to keep clean and use ABX ointment. Will place on doxycycline  to ensure no cellulitis formation especially since pt is diabetic. Avoid pressure on area and advised to monitor to ensure continued healing - doxycycline  (VIBRA -TABS) 100 MG tablet; Take 1 tablet (100 mg total) by mouth 2 (two) times daily.  Dispense: 20 tablet; Refill: 0   General Counseling: [name] understanding of the findings of todays visit and agrees with plan of treatment. I have discussed any further diagnostic evaluation that may be needed or ordered today. We also reviewed his medications today. he has been encouraged to call the office with any questions or concerns that should arise related to todays visit.    Counseling:    No orders of the defined types were placed in this encounter.   Meds ordered this encounter  Medications   doxycycline  (VIBRA -TABS) 100 MG tablet    Sig: Take 1 tablet (100 mg total) by mouth 2 (two) times daily.    Dispense:  20 tablet    Refill:  0    Time spent:25 Minutes

## 2024-01-01 ENCOUNTER — Other Ambulatory Visit: Payer: Self-pay | Admitting: Nurse Practitioner

## 2024-01-01 DIAGNOSIS — E119 Type 2 diabetes mellitus without complications: Secondary | ICD-10-CM

## 2024-01-01 MED ORDER — INSULIN REGULAR HUMAN 100 UNIT/ML IJ SOLN: INTRAMUSCULAR | 1 refills | Status: DC

## 2024-03-04 ENCOUNTER — Ambulatory Visit: Payer: Medicare Other | Admitting: Nurse Practitioner

## 2024-03-05 ENCOUNTER — Encounter: Payer: Self-pay | Admitting: Nurse Practitioner

## 2024-03-05 ENCOUNTER — Ambulatory Visit (INDEPENDENT_AMBULATORY_CARE_PROVIDER_SITE_OTHER): Admitting: Nurse Practitioner

## 2024-03-05 VITALS — BP 131/60 | HR 82 | Temp 98.5°F | Resp 16 | Ht 71.0 in | Wt 224.8 lb

## 2024-03-05 DIAGNOSIS — E039 Hypothyroidism, unspecified: Secondary | ICD-10-CM | POA: Diagnosis not present

## 2024-03-05 DIAGNOSIS — I10 Essential (primary) hypertension: Secondary | ICD-10-CM

## 2024-03-05 DIAGNOSIS — E785 Hyperlipidemia, unspecified: Secondary | ICD-10-CM

## 2024-03-05 DIAGNOSIS — E1159 Type 2 diabetes mellitus with other circulatory complications: Secondary | ICD-10-CM

## 2024-03-05 DIAGNOSIS — E559 Vitamin D deficiency, unspecified: Secondary | ICD-10-CM

## 2024-03-05 DIAGNOSIS — Z794 Long term (current) use of insulin: Secondary | ICD-10-CM

## 2024-03-05 DIAGNOSIS — N182 Chronic kidney disease, stage 2 (mild): Secondary | ICD-10-CM

## 2024-03-05 DIAGNOSIS — Z125 Encounter for screening for malignant neoplasm of prostate: Secondary | ICD-10-CM

## 2024-03-05 DIAGNOSIS — E1169 Type 2 diabetes mellitus with other specified complication: Secondary | ICD-10-CM

## 2024-03-05 DIAGNOSIS — I152 Hypertension secondary to endocrine disorders: Secondary | ICD-10-CM

## 2024-03-05 DIAGNOSIS — E1122 Type 2 diabetes mellitus with diabetic chronic kidney disease: Secondary | ICD-10-CM

## 2024-03-05 DIAGNOSIS — E538 Deficiency of other specified B group vitamins: Secondary | ICD-10-CM

## 2024-03-05 LAB — POCT GLYCOSYLATED HEMOGLOBIN (HGB A1C): Hemoglobin A1C: 6.5 % — AB (ref 4.0–5.6)

## 2024-03-05 MED ORDER — HYDROCHLOROTHIAZIDE 25 MG PO TABS: 25.0000 mg | ORAL_TABLET | Freq: Every day | ORAL | 2 refills | Status: AC

## 2024-03-05 MED ORDER — BENAZEPRIL HCL 40 MG PO TABS: 40.0000 mg | ORAL_TABLET | Freq: Every day | ORAL | 2 refills | Status: AC

## 2024-03-05 MED ORDER — ATORVASTATIN CALCIUM 40 MG PO TABS: 40.0000 mg | ORAL_TABLET | Freq: Every day | ORAL | 1 refills | Status: DC

## 2024-03-05 NOTE — Progress Notes (Signed)
 [facility] [address] [place], [place] [address]  Internal MEDICINE  Office Visit Note  Patient Name: [name]  [id number]  [id number]  Date of Service: 03/05/2024  Chief Complaint  Patient presents with   Diabetes   Gastroesophageal Reflux   Hyperlipidemia   Hypertension   Follow-up    HPI Zackory presents for a follow-up visit for diabetes, hypothyroidism, high cholesterol and hypertension.  Diabetes -- A1c is improved to 6.5 and is stable. Dexcom reports reviewed with patient. Over the past 90 days, his glucose has been in range 70% of the time. And has been in range 82% of the time in the past 3 days. Average glucose is 157. Current glucose level at today's visit is 108.  High cholesterol -- lipid panel has been well controlled. He is due for labs now. He is currently taking atorvastatin  and a fish oil  supplement.  Hypertension -- controlled with carvedilol, benazepril  and hydrochlorothiazide . Hypothyroidism -- his levels have been in therapeutic range. He takes generic [facility]othyroxine which [facility] Darline Eis retired.     Current Medication: Outpatient Encounter Medications as of 03/05/2024  Medication Sig   albuterol  (VENTOLIN  HFA) 108 (90 Base) MCG/ACT inhaler Inhale 2 puffs into the lungs every 6 (six) hours as needed for wheezing or shortness of breath.   alendronate (FOSAMAX) 70 MG tablet Take 70 mg by mouth once a week.   aspirin  EC 81 MG tablet Take 81 mg by mouth daily.   carvedilol (COREG) 6.25 MG tablet TAKE ONE TABLET BY MOUTH TWO TIMES A DAY HYPERTENSION   Cholecalciferol  1000 units capsule Take 1,000 Units by mouth daily.    Cinnamon 500 MG TABS Take 1,000 mg by mouth daily.   Glucosamine-Chondroitin 500-400 MG CAPS Take 3 tablets by mouth daily.   insulin  glargine-yfgn (SEMGLEE ) 100 UNIT/ML Pen INJECT 57 UNITS UNDER SKIN EVERY DAY DISCARD PEN 28 DAYS AFTER OPENING.   insulin  regular (HUMULIN  R) 100 units/mL injection  Inject 12 UNITS into skin every morning and inject 15 units into skin every evening with meals.   [place] Ginseng 1000 MG TABS Take 1,000 mg by mouth daily.   levothyroxine (SYNTHROID) 125 MCG tablet Take 125 mcg by mouth daily before breakfast.   Omega 3 1200 MG CAPS Take 1,200 mg by mouth daily.   ONE TOUCH ULTRA TEST test strip CHECK SUGAR 5 X A DAY FOR DX 250.3 & FLUCTUATING SUGAR   [DISCONTINUED] atorvastatin  (LIPITOR) 40 MG tablet Take 1 tablet (40 mg total) by mouth daily.   [DISCONTINUED] benazepril  (LOTENSIN ) 40 MG tablet Take 1 tablet (40 mg total) by mouth daily.   [DISCONTINUED] doxycycline  (VIBRA -TABS) 100 MG tablet Take 1 tablet (100 mg total) by mouth 2 (two) times daily.   [DISCONTINUED] hydrochlorothiazide  (HYDRODIURIL ) 25 MG tablet Take 1 tablet (25 mg total) by mouth daily.   atorvastatin  (LIPITOR) 40 MG tablet Take 1 tablet (40 mg total) by mouth daily.   benazepril  (LOTENSIN ) 40 MG tablet Take 1 tablet (40 mg total) by mouth daily.   hydrochlorothiazide  (HYDRODIURIL ) 25 MG tablet Take 1 tablet (25 mg total) by mouth daily.   No facility-administered encounter medications on file as of 03/05/2024.    Surgical History: Past Surgical History:  Procedure Laterality Date   CARPAL TUNNEL RELEASE Right 1980's   CARPAL TUNNEL RELEASE Left 05/05/2016   Procedure: CARPAL TUNNEL RELEASE;  Surgeon: Molli Angelucci, MD;  Location: [facility];  Service: Orthopedics;  Laterality: Left;  CATARACT EXTRACTION W/PHACO Left 05/12/2015   Procedure: CATARACT EXTRACTION PHACO AND INTRAOCULAR LENS PLACEMENT (IOC);  Surgeon: Clair Crews, MD;  Location: [facility];  Service: Ophthalmology;  Laterality: Left;  US  01:53.1AP% 21.4CDE 24.75fluid pack lot # [id number]   CATARACT EXTRACTION W/PHACO Right 07/02/2019   Procedure: CATARACT EXTRACTION PHACO AND INTRAOCULAR LENS PLACEMENT (IOC) RIGHT DIABETIC  01:53.2  18.9%  21.43;  Surgeon: Clair Crews, MD;  Location: [facility];  Service:  Ophthalmology;  Laterality: Right;  Diabetic - insulin    EYE SURGERY Left    cataract extractions   INCISION AND DRAINAGE OF WOUND Left 1988   "staph infection" left leg   INTRAMEDULLARY (IM) NAIL INTERTROCHANTERIC Right 01/22/2018   Procedure: INTRAMEDULLARY (IM) NAIL INTERTROCHANTRIC;  Surgeon: Rande Bushy, MD;  Location: [facility];  Service: Orthopedics;  Laterality: Right;   KNEE ARTHROPLASTY Left 12/28/2015   Procedure: COMPUTER ASSISTED TOTAL KNEE ARTHROPLASTY;  Surgeon: Arlyne Lame, MD;  Location: [facility];  Service: Orthopedics;  Laterality: Left;   ORIF WRIST FRACTURE Left 05/05/2016   Procedure: OPEN REDUCTION INTERNAL FIXATION (ORIF) WRIST FRACTURE;  Surgeon: Molli Angelucci, MD;  Location: [facility];  Service: Orthopedics;  Laterality: Left;    Medical History: Past Medical History:  Diagnosis Date   Agent orange exposure    [place] war exposure    Arthritis    "left knee" (03/18/2014)   Closed pelvic fracture (HCC) 03/18/2014   "multiple S/P fall w/loss of consciousness; CBG 29"   Encephalopathy acute 08/16/2013   GERD (gastroesophageal reflux disease)    High cholesterol    History of use of hearing aid in both ears    does not currently wear   Hypertension    Hypothyroidism    Osteopenia    Type II diabetes mellitus (HCC)     Family History: Family History  Problem Relation Age of Onset   Colon cancer Mother    Cerebral aneurysm Father    Prostate cancer Neg Hx    Bladder Cancer Neg Hx    Kidney cancer Neg Hx     Social History   Socioeconomic History   Marital status: Married    Spouse name: Not on file   Number of children: Not on file   Years of education: Not on file   Highest education level: Not on file  Occupational History   Not on file  Tobacco Use   Smoking status: Former    Current packs/day: 0.00    Average packs/day: 3.0 packs/day for 30.0 years (90.0 ttl pk-yrs)    Types: Cigarettes    Start date: 75    Quit date: 83    Years  since quitting: 37.3   Smokeless tobacco: Never   Tobacco comments:    "quit smoking in 1988"  Vaping Use   Vaping status: Never Used  Substance and Sexual Activity   Alcohol use: No   Drug use: No   Sexual activity: Never  Other Topics Concern   Not on file  Social History Narrative   Not on file   Social Drivers of Health   Financial Resource Strain: Not on file  Food Insecurity: Not on file  Transportation Needs: Not on file  Physical Activity: Not on file  Stress: Not on file  Social Connections: Not on file  Intimate Partner Violence: Not on file      Review of Systems  Constitutional:  Negative for chills, fatigue and unexpected weight change.  HENT:  Negative for congestion, rhinorrhea, sneezing  and sore throat.   Eyes:  Negative for redness.  Respiratory:  Negative for cough, chest tightness and shortness of breath.   Cardiovascular:  Negative for chest pain and palpitations.  Gastrointestinal:  Negative for abdominal pain, constipation, diarrhea, nausea and vomiting.  Genitourinary:  Negative for dysuria and frequency.  Musculoskeletal:  Negative for arthralgias, back pain, joint swelling and neck pain.       Partial amputation of left third finger  Skin:  Negative for rash.  Neurological: Negative.  Negative for tremors and numbness.  Hematological:  Negative for adenopathy. Does not bruise/bleed easily.  Psychiatric/Behavioral:  Negative for behavioral problems (Depression), sleep disturbance and suicidal ideas. The patient is not nervous/anxious.     Vital Signs: BP 131/60   Pulse 82   Temp 98.5 F (36.9 C)   Resp 16   Ht 5\' 11"  (1.803 m)   Wt 224 lb 12.8 oz (102 kg)   SpO2 93%   BMI 31.35 kg/m    Physical Exam Vitals reviewed.  Constitutional:      General: He is not in acute distress.    Appearance: Normal appearance. He is obese. He is not ill-appearing.  HENT:     Head: Normocephalic and atraumatic.  Eyes:     Pupils: Pupils are equal,  round, and reactive to light.  Cardiovascular:     Rate and Rhythm: Normal rate and regular rhythm.     Heart sounds: Normal heart sounds. No murmur heard. Pulmonary:     Effort: Pulmonary effort is normal. No respiratory distress.     Breath sounds: Normal breath sounds. No wheezing.  Neurological:     Mental Status: He is alert and oriented to person, place, and time.  Psychiatric:        Mood and Affect: Mood normal.        Behavior: Behavior normal.        Assessment/Plan: 1. Type 2 diabetes mellitus with other specified complication, with long-term current use of insulin  (HCC) (Primary) A1c is stable at 6.5 today. Continue medications as prescribed. Routine labs ordered.  - POCT glycosylated hemoglobin (Hb A1C) - CBC with Differential/Platelet - CMP14+EGFR - Lipid Profile  2. Hypertension associated with diabetes (HCC) Stable, continue carvedilol, benazepril  and hydrochlorothiazide  as prescribed.  - benazepril  (LOTENSIN ) 40 MG tablet; Take 1 tablet (40 mg total) by mouth daily.  Dispense: 90 tablet; Refill: 2 - hydrochlorothiazide  (HYDRODIURIL ) 25 MG tablet; Take 1 tablet (25 mg total) by mouth daily.  Dispense: 90 tablet; Refill: 2  3. CKD stage 2 due to type 2 diabetes mellitus (HCC) Routine labs ordered.  - CBC with Differential/Platelet - CMP14+EGFR - Lipid Profile  4. Hyperlipidemia associated with type 2 diabetes mellitus (HCC) Continue atorvastatin  as prescribed. Routine lipid panel ordered  - atorvastatin  (LIPITOR) 40 MG tablet; Take 1 tablet (40 mg total) by mouth daily.  Dispense: 90 tablet; Refill: 1  5. Acquired hypothyroidism Routine labs ordered. [facility] - CBC with Differential/Platelet - CMP14+EGFR - Lipid Profile - Vitamin D  (25 hydroxy)  6. B12 deficiency Routine labs ordered  - CBC with Differential/Platelet - B12 and Folate Panel - Iron, TIBC and Ferritin Panel  7. Vitamin D  deficiency Routine lab  ordered  - Vitamin D  (25 hydroxy)  8. Prostate cancer screening Routine lab ordered  - PSA Total (Reflex To Free)   General Counseling: Ardelle Kos understanding of the findings of todays visit and agrees with plan of treatment. I have discussed  any further diagnostic evaluation that may be needed or ordered today. We also reviewed his medications today. he has been encouraged to call the office with any questions or concerns that should arise related to todays visit.    Orders Placed This Encounter  Procedures   CBC with Differential/Platelet   CMP14+EGFR   Lipid Profile   PSA Total (Reflex To Free)   Vitamin D  (25 hydroxy)   B12 and Folate Panel   Iron, TIBC and Ferritin Panel   POCT glycosylated hemoglobin (Hb A1C)    Meds ordered this encounter  Medications   atorvastatin  (LIPITOR) 40 MG tablet    Sig: Take 1 tablet (40 mg total) by mouth daily.    Dispense:  90 tablet    Refill:  1    For future refills   benazepril  (LOTENSIN ) 40 MG tablet    Sig: Take 1 tablet (40 mg total) by mouth daily.    Dispense:  90 tablet    Refill:  2    For future refills   hydrochlorothiazide  (HYDRODIURIL ) 25 MG tablet    Sig: Take 1 tablet (25 mg total) by mouth daily.    Dispense:  90 tablet    Refill:  2    For future refills    Return for previously scheduled, [facility], Dariah Mcsorley PCP in september.   Total time spent:30 Minutes Time spent includes review of chart, medications, test results, and follow up plan with the patient.   [place] Controlled Substance Database was reviewed by me.  This patient was seen by Laurence Pons, FNP-C in collaboration with Dr. Verneta Gone as a part of collaborative care agreement.   Zahli Vetsch R. Bobbi Burow, MSN, FNP-C Internal medicine

## 2024-03-06 LAB — CBC WITH DIFFERENTIAL/PLATELET
Basophils Absolute: 0.1 10*3/uL (ref 0.0–0.2)
Basos: 1 %
EOS (ABSOLUTE): 0 10*3/uL (ref 0.0–0.4)
Eos: 0 %
Hematocrit: 32.3 % — ABNORMAL LOW (ref 37.5–51.0)
Hemoglobin: 10.9 g/dL — ABNORMAL LOW (ref 13.0–17.7)
Immature Grans (Abs): 0.1 10*3/uL (ref 0.0–0.1)
Immature Granulocytes: 1 %
Lymphocytes Absolute: 5.4 10*3/uL — ABNORMAL HIGH (ref 0.7–3.1)
Lymphs: 68 %
MCH: 34.7 pg — ABNORMAL HIGH (ref 26.6–33.0)
MCHC: 33.7 g/dL (ref 31.5–35.7)
MCV: 103 fL — ABNORMAL HIGH (ref 79–97)
Monocytes Absolute: 0.5 10*3/uL (ref 0.1–0.9)
Monocytes: 7 %
Neutrophils Absolute: 1.8 10*3/uL (ref 1.4–7.0)
Neutrophils: 23 %
Platelets: 105 10*3/uL — ABNORMAL LOW (ref 150–450)
RBC: 3.14 x10E6/uL — ABNORMAL LOW (ref 4.14–5.80)
RDW: 15.5 % — ABNORMAL HIGH (ref 11.6–15.4)
WBC: 7.9 10*3/uL (ref 3.4–10.8)

## 2024-03-06 LAB — CMP14+EGFR
ALT: 25 IU/L (ref 0–44)
AST: 25 IU/L (ref 0–40)
Albumin: 3.8 g/dL (ref 3.7–4.7)
Alkaline Phosphatase: 91 IU/L (ref 44–121)
BUN/Creatinine Ratio: 18 (ref 10–24)
BUN: 17 mg/dL (ref 8–27)
Bilirubin Total: 0.4 mg/dL (ref 0.0–1.2)
CO2: 26 mmol/L (ref 20–29)
Calcium: 9.2 mg/dL (ref 8.6–10.2)
Chloride: 108 mmol/L — ABNORMAL HIGH (ref 96–106)
Creatinine, Ser: 0.94 mg/dL (ref 0.76–1.27)
Globulin, Total: 2 g/dL (ref 1.5–4.5)
Glucose: 77 mg/dL (ref 70–99)
Potassium: 4.6 mmol/L (ref 3.5–5.2)
Sodium: 144 mmol/L (ref 134–144)
Total Protein: 5.8 g/dL — ABNORMAL LOW (ref 6.0–8.5)
eGFR: 80 mL/min/{1.73_m2} (ref >59)

## 2024-03-06 LAB — LIPID PANEL
Chol/HDL Ratio: 2.4 ratio (ref 0.0–5.0)
Cholesterol, Total: 83 mg/dL — ABNORMAL LOW (ref 100–199)
HDL: 34 mg/dL — ABNORMAL LOW (ref >39)
LDL Chol Calc (NIH): 35 mg/dL (ref 0–99)
Triglycerides: 61 mg/dL (ref 0–149)
VLDL Cholesterol Cal: 14 mg/dL (ref 5–40)

## 2024-03-06 LAB — VITAMIN D 25 HYDROXY (VIT D DEFICIENCY, FRACTURES): Vit D, 25-Hydroxy: 44.4 ng/mL (ref 30.0–100.0)

## 2024-03-06 LAB — PSA TOTAL (REFLEX TO FREE): Prostate Specific Ag, Serum: 1.5 ng/mL (ref 0.0–4.0)

## 2024-03-06 LAB — IRON,TIBC AND FERRITIN PANEL
Ferritin: 271 ng/mL (ref 30–400)
Iron Saturation: 50 % (ref 15–55)
Iron: 108 ug/dL (ref 38–169)
Total Iron Binding Capacity: 217 ug/dL — ABNORMAL LOW (ref 250–450)
UIBC: 109 ug/dL — ABNORMAL LOW (ref 111–343)

## 2024-03-06 LAB — B12 AND FOLATE PANEL
Folate: 8.2 ng/mL (ref >3.0)
Vitamin B-12: 1302 pg/mL — ABNORMAL HIGH (ref 232–1245)

## 2024-04-06 ENCOUNTER — Encounter: Payer: Self-pay | Admitting: Nurse Practitioner

## 2024-04-15 ENCOUNTER — Telehealth: Payer: Self-pay

## 2024-04-15 NOTE — Telephone Encounter (Signed)
 Patient daughter and wife called stating they are out of town and he forgot his diabetic sliding scale chart at home, spoke with alyssa and she gave me a copy of one that we have here in office as we don't know which one the has at home.

## 2024-07-03 ENCOUNTER — Encounter: Payer: Self-pay | Admitting: Nurse Practitioner

## 2024-07-03 ENCOUNTER — Ambulatory Visit: Payer: Medicare Other | Admitting: Nurse Practitioner

## 2024-07-03 VITALS — BP 137/60 | HR 77 | Temp 98.4°F | Resp 16 | Ht 71.0 in | Wt 221.8 lb

## 2024-07-03 DIAGNOSIS — Z794 Long term (current) use of insulin: Secondary | ICD-10-CM | POA: Diagnosis not present

## 2024-07-03 DIAGNOSIS — Z0000 Encounter for general adult medical examination without abnormal findings: Secondary | ICD-10-CM | POA: Diagnosis not present

## 2024-07-03 DIAGNOSIS — E1169 Type 2 diabetes mellitus with other specified complication: Secondary | ICD-10-CM | POA: Diagnosis not present

## 2024-07-03 DIAGNOSIS — N1831 Chronic kidney disease, stage 3a: Secondary | ICD-10-CM

## 2024-07-03 DIAGNOSIS — E1159 Type 2 diabetes mellitus with other circulatory complications: Secondary | ICD-10-CM | POA: Diagnosis not present

## 2024-07-03 DIAGNOSIS — E785 Hyperlipidemia, unspecified: Secondary | ICD-10-CM

## 2024-07-03 DIAGNOSIS — I152 Hypertension secondary to endocrine disorders: Secondary | ICD-10-CM

## 2024-07-03 LAB — POCT GLYCOSYLATED HEMOGLOBIN (HGB A1C): Hemoglobin A1C: 6.6 % — AB (ref 4.0–5.6)

## 2024-07-03 MED ORDER — INSULIN REGULAR HUMAN 100 UNIT/ML IJ SOLN: INTRAMUSCULAR | 1 refills | Status: DC

## 2024-07-03 NOTE — Progress Notes (Signed)
 [facility] [address] [place], [place] [address]  Internal MEDICINE  Office Visit Note  Patient Name: [name]  [id number]  [id number]  Date of Service: 07/03/2024  Chief Complaint  Patient presents with   Diabetes   Gastroesophageal Reflux   Hypertension   Hyperlipidemia   Medicare Wellness    HPI [name] presents for a medicare annual wellness visit.  Well-appearing 83 y.o. male with hypertension, GERD, hypothyroidism, diabetes, CKD stage 3 Routine CRC screening: discontinued  Eye exam: sees eye doctor regularly, goes to [place].  foot exam: done  Labs: labs are up to date, done in may  New or worsening pain: none  Other concerns: none      07/03/2024    9:55 AM 06/30/2023   11:11 AM 06/24/2022   11:01 AM  MMSE - Mini Mental State Exam  Not completed:   Unable to complete  Orientation to time 5 5   Orientation to Place 5 0   Registration 3 3   Attention/ Calculation 5 5   Recall 3 3   Language- name 2 objects 2 2   Language- repeat 1 1   Language- follow 3 step command 3 3   Language- read & follow direction 1 1   Write a sentence 0 1   Copy design 1 1   Total score 29 25     Functional Status Survey: Is the patient deaf or have difficulty hearing?: Yes Does the patient have difficulty seeing, even when wearing glasses/contacts?: No Does the patient have difficulty concentrating, remembering, or making decisions?: No Does the patient have difficulty walking or climbing stairs?: No Does the patient have difficulty dressing or bathing?: No Does the patient have difficulty doing errands alone such as visiting a doctor's office or shopping?: No     09/08/2022    4:40 PM 09/16/2022    8:57 AM 02/14/2023   10:40 AM 06/30/2023   11:09 AM 07/03/2024    9:53 AM  Fall Risk  Falls in the past year?  1 0 0 0  Was there an injury with Fall?  1 0 0 0  Fall Risk Category Calculator  3 0 0 0  Fall Risk Category (Retired)  High      (RETIRED) Patient Fall  Risk Level Low fall risk  High fall risk      Patient at Risk for Falls Due to  Impaired balance/gait No Fall Risks No Fall Risks No Fall Risks  Fall risk Follow up  Falls evaluation completed  Falls evaluation completed Falls evaluation completed Falls evaluation completed     Data saved with a previous flowsheet row definition       07/03/2024    9:54 AM  Depression screen PHQ 2/9  Decreased Interest 0  Down, Depressed, Hopeless 0  PHQ - 2 Score 0        Current Medication: Outpatient Encounter Medications as of 07/03/2024  Medication Sig   albuterol  (VENTOLIN  HFA) 108 (90 Base) MCG/ACT inhaler Inhale 2 puffs into the lungs every 6 (six) hours as needed for wheezing or shortness of breath.   alendronate (FOSAMAX) 70 MG tablet Take 70 mg by mouth once a week.   aspirin  EC 81 MG tablet Take 81 mg by mouth daily.   atorvastatin  (LIPITOR) 40 MG tablet Take 1 tablet (40 mg total) by mouth daily.   benazepril  (LOTENSIN ) 40 MG tablet Take 1 tablet (40 mg total) by mouth daily.   carvedilol (COREG) 6.25  MG tablet TAKE ONE TABLET BY MOUTH TWO TIMES A DAY HYPERTENSION   Cholecalciferol  1000 units capsule Take 1,000 Units by mouth daily.    Cinnamon 500 MG TABS Take 1,000 mg by mouth daily.   Glucosamine-Chondroitin 500-400 MG CAPS Take 3 tablets by mouth daily.   hydrochlorothiazide  (HYDRODIURIL ) 25 MG tablet Take 1 tablet (25 mg total) by mouth daily.   insulin  glargine-yfgn (SEMGLEE ) 100 UNIT/ML Pen INJECT 57 UNITS UNDER SKIN EVERY DAY DISCARD PEN 28 DAYS AFTER OPENING.   insulin  regular (HUMULIN  R) 100 units/mL injection Inject 12 UNITS into skin every morning and inject 15 units into skin every evening with meals.   [place] Ginseng 1000 MG TABS Take 1,000 mg by mouth daily.   levothyroxine (SYNTHROID) 125 MCG tablet Take 125 mcg by mouth daily before breakfast.   Omega 3 1200 MG CAPS Take 1,200 mg by mouth daily.   ONE TOUCH ULTRA TEST test strip CHECK SUGAR 5 X A DAY FOR DX 250.3 &  FLUCTUATING SUGAR   [DISCONTINUED] insulin  regular (HUMULIN  R) 100 units/mL injection Inject 12 UNITS into skin every morning and inject 15 units into skin every evening with meals.   No facility-administered encounter medications on file as of 07/03/2024.    Surgical History: Past Surgical History:  Procedure Laterality Date   CARPAL TUNNEL RELEASE Right 1980's   CARPAL TUNNEL RELEASE Left 05/05/2016   Procedure: CARPAL TUNNEL RELEASE;  Surgeon: Ozell Flake, MD;  Location: [facility];  Service: Orthopedics;  Laterality: Left;   CATARACT EXTRACTION W/PHACO Left 05/12/2015   Procedure: CATARACT EXTRACTION PHACO AND INTRAOCULAR LENS PLACEMENT (IOC);  Surgeon: Elsie Carmine, MD;  Location: [facility];  Service: Ophthalmology;  Laterality: Left;  US  01:53.1AP% 21.4CDE 24.48fluid pack lot # [id number]   CATARACT EXTRACTION W/PHACO Right 07/02/2019   Procedure: CATARACT EXTRACTION PHACO AND INTRAOCULAR LENS PLACEMENT (IOC) RIGHT DIABETIC  01:53.2  18.9%  21.43;  Surgeon: Carmine Elsie, MD;  Location: [facility];  Service: Ophthalmology;  Laterality: Right;  Diabetic - insulin    EYE SURGERY Left    cataract extractions   INCISION AND DRAINAGE OF WOUND Left 1988   staph infection left leg   INTRAMEDULLARY (IM) NAIL INTERTROCHANTERIC Right 01/22/2018   Procedure: INTRAMEDULLARY (IM) NAIL INTERTROCHANTRIC;  Surgeon: Marchia Drivers, MD;  Location: [facility];  Service: Orthopedics;  Laterality: Right;   KNEE ARTHROPLASTY Left 12/28/2015   Procedure: COMPUTER ASSISTED TOTAL KNEE ARTHROPLASTY;  Surgeon: Lynwood SHAUNNA Hue, MD;  Location: [facility];  Service: Orthopedics;  Laterality: Left;   ORIF WRIST FRACTURE Left 05/05/2016   Procedure: OPEN REDUCTION INTERNAL FIXATION (ORIF) WRIST FRACTURE;  Surgeon: Ozell Flake, MD;  Location: [facility];  Service: Orthopedics;  Laterality: Left;    Medical History: Past Medical History:  Diagnosis Date   Agent orange exposure    [place] war exposure     Arthritis    left knee (03/18/2014)   Closed pelvic fracture (HCC) 03/18/2014   multiple S/P fall w/loss of consciousness; CBG 29   Encephalopathy acute 08/16/2013   GERD (gastroesophageal reflux disease)    High cholesterol    History of use of hearing aid in both ears    does not currently wear   Hypertension    Hypothyroidism    Osteopenia    Type II diabetes mellitus (HCC)     Family History: Family History  Problem Relation Age of Onset   Colon cancer Mother    Cerebral aneurysm Father    Prostate cancer Neg Hx  Bladder Cancer Neg Hx    Kidney cancer Neg Hx     Social History   Socioeconomic History   Marital status: Married    Spouse name: Not on file   Number of children: Not on file   Years of education: Not on file   Highest education level: Not on file  Occupational History   Not on file  Tobacco Use   Smoking status: Former    Current packs/day: 0.00    Average packs/day: 3.0 packs/day for 30.0 years (90.0 ttl pk-yrs)    Types: Cigarettes    Start date: 68    Quit date: 16    Years since quitting: 37.6   Smokeless tobacco: Never   Tobacco comments:    quit smoking in 1988  Vaping Use   Vaping status: Never Used  Substance and Sexual Activity   Alcohol use: No   Drug use: No   Sexual activity: Never  Other Topics Concern   Not on file  Social History Narrative   Not on file   Social Drivers of Health   Financial Resource Strain: Not on file  Food Insecurity: Not on file  Transportation Needs: Not on file  Physical Activity: Not on file  Stress: Not on file  Social Connections: Not on file  Intimate Partner Violence: Not on file      Review of Systems  Constitutional:  Negative for activity change, appetite change, chills, fatigue, fever and unexpected weight change.  HENT:  Positive for hearing loss (has hearing aids now). Negative for congestion, ear pain, rhinorrhea, sneezing, sore throat and trouble swallowing.   Eyes:  Negative.  Negative for redness.  Respiratory: Negative.  Negative for cough, chest tightness, shortness of breath and wheezing.   Cardiovascular: Negative.  Negative for chest pain and palpitations.  Gastrointestinal: Negative.  Negative for abdominal pain, blood in stool, constipation, diarrhea, nausea and vomiting.  Endocrine: Negative.   Genitourinary: Negative.  Negative for difficulty urinating, dysuria, frequency, hematuria and urgency.  Musculoskeletal: Negative.  Negative for arthralgias, back pain, joint swelling, myalgias and neck pain.  Skin:  Negative for rash and wound.  Allergic/Immunologic: Negative.  Negative for immunocompromised state.  Neurological: Negative.  Negative for dizziness, tremors, seizures, numbness and headaches.  Hematological: Negative.  Negative for adenopathy. Does not bruise/bleed easily.  Psychiatric/Behavioral: Negative.  Negative for agitation, behavioral problems (Depression), self-injury, sleep disturbance and suicidal ideas. The patient is not nervous/anxious.     Vital Signs: BP 137/60   Pulse 77   Temp 98.4 F (36.9 C)   Resp 16   Ht 5' 11 (1.803 m)   Wt 221 lb 12.8 oz (100.6 kg)   SpO2 98%   BMI 30.93 kg/m    Physical Exam Vitals reviewed.  Constitutional:      General: He is not in acute distress.    Appearance: Normal appearance. He is well-developed. He is obese. He is not ill-appearing or diaphoretic.  HENT:     Head: Normocephalic and atraumatic.     Right Ear: Tympanic membrane, ear canal and external ear normal.     Left Ear: Tympanic membrane, ear canal and external ear normal.     Nose: Nose normal. No congestion or rhinorrhea.     Mouth/Throat:     Mouth: Mucous membranes are moist.     Pharynx: Oropharynx is clear. No oropharyngeal exudate or posterior oropharyngeal erythema.  Eyes:     General: No scleral icterus.  Right eye: No discharge.        Left eye: No discharge.     Extraocular Movements: Extraocular  movements intact.     Conjunctiva/sclera: Conjunctivae normal.     Pupils: Pupils are equal, round, and reactive to light.  Neck:     Thyroid : No thyromegaly.     Vascular: No carotid bruit or JVD.     Trachea: No tracheal deviation.  Cardiovascular:     Rate and Rhythm: Normal rate and regular rhythm.     Heart sounds: Normal heart sounds. No murmur heard.    No friction rub. No gallop.  Pulmonary:     Effort: Pulmonary effort is normal. No respiratory distress.     Breath sounds: Normal breath sounds. No stridor. No wheezing or rales.  Chest:     Chest wall: No tenderness.  Abdominal:     General: Bowel sounds are normal. There is no distension.     Palpations: Abdomen is soft. There is no mass.     Tenderness: There is no abdominal tenderness. There is no guarding or rebound.  Musculoskeletal:        General: No tenderness or deformity. Normal range of motion.     Cervical back: Normal range of motion and neck supple.  Lymphadenopathy:     Cervical: No cervical adenopathy.  Skin:    General: Skin is warm and dry.     Capillary Refill: Capillary refill takes less than 2 seconds.     Coloration: Skin is not pale.  Neurological:     Mental Status: He is alert and oriented to person, place, and time.     Cranial Nerves: No cranial nerve deficit.     Motor: No abnormal muscle tone.     Coordination: Coordination normal.     Gait: Gait normal.     Deep Tendon Reflexes: Reflexes are normal and symmetric.  Psychiatric:        Mood and Affect: Mood normal.        Behavior: Behavior normal.        Thought Content: Thought content normal.        Judgment: Judgment normal.        Assessment/Plan: 1. Encounter for subsequent annual wellness visit (AWV) in Medicare patient Age-appropriate preventive screenings and vaccinations discussed. Routine labs for health maintenance are up to date, last done in may this year. PHM updated.    2. Type 2 diabetes mellitus with other  specified complication, with long-term current use of insulin  (HCC) (Primary) A1c is 6.6 today which is stable, continue current treatment regimen, dexcom reports reviewed with patient.  - POCT glycosylated hemoglobin (Hb A1C) - insulin  regular (HUMULIN  R) 100 units/mL injection; Inject 12 UNITS into skin every morning and inject 15 units into skin every evening with meals.  Dispense: 40 mL; Refill: 1  3. Hypertension associated with type 2 diabetes mellitus (HCC) Stable, continue medications as prescribed.   4. Stage 3a chronic kidney disease (HCC) Continue benazepril  as prescribed.   5. Hyperlipidemia associated with type 2 diabetes mellitus (HCC) Continue atorvastatin  as prescribed.       General Counseling: [name] understanding of the findings of todays visit and agrees with plan of treatment. I have discussed any further diagnostic evaluation that may be needed or ordered today. We also reviewed his medications today. he has been encouraged to call the office with any questions or concerns that should arise related to todays visit.    Orders Placed This  Encounter  Procedures   POCT glycosylated hemoglobin (Hb A1C)    Meds ordered this encounter  Medications   insulin  regular (HUMULIN  R) 100 units/mL injection    Sig: Inject 12 UNITS into skin every morning and inject 15 units into skin every evening with meals.    Dispense:  40 mL    Refill:  1    Fill new script today    Return in about 4 months (around 11/02/2024) for F/U, Recheck A1C, Marcelline Temkin PCP.   Total time spent:30 Minutes Time spent includes review of chart, medications, test results, and follow up plan with the patient.   [place] Controlled Substance Database was reviewed by me.  This patient was seen by Mardy Maxin, FNP-C in collaboration with Dr. Sigrid Bathe as a part of collaborative care agreement.  Roberth Berling R. Maxin, MSN, FNP-C Internal medicine

## 2024-07-23 ENCOUNTER — Other Ambulatory Visit: Payer: Self-pay | Admitting: Nurse Practitioner

## 2024-07-23 DIAGNOSIS — Z794 Long term (current) use of insulin: Secondary | ICD-10-CM

## 2024-11-06 ENCOUNTER — Encounter: Payer: Self-pay | Admitting: Nurse Practitioner

## 2024-11-06 ENCOUNTER — Ambulatory Visit (INDEPENDENT_AMBULATORY_CARE_PROVIDER_SITE_OTHER): Admitting: Nurse Practitioner

## 2024-11-06 VITALS — BP 124/62 | HR 68 | Temp 98.2°F | Resp 16 | Ht 71.0 in | Wt 223.2 lb

## 2024-11-06 DIAGNOSIS — E1169 Type 2 diabetes mellitus with other specified complication: Secondary | ICD-10-CM | POA: Diagnosis not present

## 2024-11-06 DIAGNOSIS — N1831 Chronic kidney disease, stage 3a: Secondary | ICD-10-CM | POA: Diagnosis not present

## 2024-11-06 DIAGNOSIS — E039 Hypothyroidism, unspecified: Secondary | ICD-10-CM | POA: Diagnosis not present

## 2024-11-06 DIAGNOSIS — E1159 Type 2 diabetes mellitus with other circulatory complications: Secondary | ICD-10-CM | POA: Diagnosis not present

## 2024-11-06 DIAGNOSIS — E785 Hyperlipidemia, unspecified: Secondary | ICD-10-CM

## 2024-11-06 DIAGNOSIS — I152 Hypertension secondary to endocrine disorders: Secondary | ICD-10-CM

## 2024-11-06 DIAGNOSIS — Z794 Long term (current) use of insulin: Secondary | ICD-10-CM | POA: Diagnosis not present

## 2024-11-06 LAB — POCT GLYCOSYLATED HEMOGLOBIN (HGB A1C): Hemoglobin A1C: 7.1 % — AB (ref 4.0–5.6)

## 2024-11-06 MED ORDER — ATORVASTATIN CALCIUM 40 MG PO TABS: 40.0000 mg | ORAL_TABLET | Freq: Every day | ORAL | 1 refills | Status: AC

## 2024-11-06 NOTE — Progress Notes (Signed)
 [facility] [address] [place], [place] [address]  Internal MEDICINE  Office Visit Note  Patient Name: [name]  [id number]  [id number]  Date of Service: 11/06/2024  Chief Complaint  Patient presents with   Diabetes   Gastroesophageal Reflux   Hyperlipidemia   Hypertension   Follow-up    HPI Lyan presents for a follow-up visit for diabetes, hypertension, high cholesterol and refills. Patient's wife is present for his office visit today.  Diabetes -- A1c is 7.1 today which is stable for patient's age. Currently on insulin  and sees the [facility] for diabetes management as well.  Osteoporosis -- takes fosamax once a week. Not sure when he started this medication. If he has been on it for 5 years, he needs to stop taking it but it is prescribed by the [facility].  Oral fibroma on the left side of the mouth. -- plans to have this looked at by the [facility] doctors  Hypertension -- controlled with current medications  High cholesterol -- on statin therapy.     Current Medication: Outpatient Encounter Medications as of 11/06/2024  Medication Sig   albuterol  (VENTOLIN  HFA) 108 (90 Base) MCG/ACT inhaler Inhale 2 puffs into the lungs every 6 (six) hours as needed for wheezing or shortness of breath. (Patient not taking: Reported on 11/06/2024)   alendronate (FOSAMAX) 70 MG tablet Take 70 mg by mouth once a week.   aspirin  EC 81 MG tablet Take 81 mg by mouth daily. (Patient not taking: Reported on 11/06/2024)   atorvastatin  (LIPITOR) 40 MG tablet Take 1 tablet (40 mg total) by mouth daily.   benazepril  (LOTENSIN ) 40 MG tablet Take 1 tablet (40 mg total) by mouth daily.   carvedilol (COREG) 6.25 MG tablet TAKE ONE TABLET BY MOUTH TWO TIMES A DAY HYPERTENSION   Cinnamon 500 MG TABS Take 1,000 mg by mouth daily.   Glucosamine-Chondroitin 500-400 MG CAPS Take 3 tablets by mouth daily.   hydrochlorothiazide  (HYDRODIURIL ) 25 MG tablet Take 1 tablet (25 mg total) by mouth daily.   insulin   glargine-yfgn (SEMGLEE ) 100 UNIT/ML Pen INJECT 57 UNITS UNDER SKIN EVERY DAY DISCARD PEN 28 DAYS AFTER OPENING.   insulin  regular (HUMULIN  R) 100 units/mL injection INJECT 12 UNITS INTO SKIN EVERY MORNING AND INJECT 15 UNITS INTO SKIN EVERY EVENING WITH MEALS.   Korean Ginseng 1000 MG TABS Take 1,000 mg by mouth daily.   levothyroxine (SYNTHROID) 125 MCG tablet Take 125 mcg by mouth daily before breakfast.   ONE TOUCH ULTRA TEST test strip CHECK SUGAR 5 X A DAY FOR DX 250.3 & FLUCTUATING SUGAR   [DISCONTINUED] atorvastatin  (LIPITOR) 40 MG tablet Take 1 tablet (40 mg total) by mouth daily.   [DISCONTINUED] Cholecalciferol  1000 units capsule Take 1,000 Units by mouth daily.  (Patient not taking: Reported on 11/06/2024)   [DISCONTINUED] Omega 3 1200 MG CAPS Take 1,200 mg by mouth daily. (Patient not taking: Reported on 11/06/2024)   No facility-administered encounter medications on file as of 11/06/2024.    Surgical History: Past Surgical History:  Procedure Laterality Date   CARPAL TUNNEL RELEASE Right 1980's   CARPAL TUNNEL RELEASE Left 05/05/2016   Procedure: CARPAL TUNNEL RELEASE;  Surgeon: Ozell Flake, MD;  Location: [facility];  Service: Orthopedics;  Laterality: Left;   CATARACT EXTRACTION W/PHACO Left 05/12/2015   Procedure: CATARACT EXTRACTION PHACO AND INTRAOCULAR LENS PLACEMENT (IOC);  Surgeon: Elsie Carmine, MD;  Location: [facility];  Service: Ophthalmology;  Laterality: Left;  US  01:53.1AP% 21.4CDE 24.72fluid pack  lot # [id number]   CATARACT EXTRACTION W/PHACO Right 07/02/2019   Procedure: CATARACT EXTRACTION PHACO AND INTRAOCULAR LENS PLACEMENT (IOC) RIGHT DIABETIC  01:53.2  18.9%  21.43;  Surgeon: Jaye Fallow, MD;  Location: [facility];  Service: Ophthalmology;  Laterality: Right;  Diabetic - insulin    EYE SURGERY Left    cataract extractions   INCISION AND DRAINAGE OF WOUND Left 1988   staph infection left leg   INTRAMEDULLARY (IM) NAIL INTERTROCHANTERIC Right 01/22/2018    Procedure: INTRAMEDULLARY (IM) NAIL INTERTROCHANTRIC;  Surgeon: Marchia Drivers, MD;  Location: [facility];  Service: Orthopedics;  Laterality: Right;   KNEE ARTHROPLASTY Left 12/28/2015   Procedure: COMPUTER ASSISTED TOTAL KNEE ARTHROPLASTY;  Surgeon: Lynwood SHAUNNA Hue, MD;  Location: [facility];  Service: Orthopedics;  Laterality: Left;   ORIF WRIST FRACTURE Left 05/05/2016   Procedure: OPEN REDUCTION INTERNAL FIXATION (ORIF) WRIST FRACTURE;  Surgeon: Ozell Flake, MD;  Location: [facility];  Service: Orthopedics;  Laterality: Left;    Medical History: Past Medical History:  Diagnosis Date   Agent orange exposure    [place] war exposure    Arthritis    left knee (03/18/2014)   Closed pelvic fracture (HCC) 03/18/2014   multiple S/P fall w/loss of consciousness; CBG 29   Encephalopathy acute 08/16/2013   GERD (gastroesophageal reflux disease)    High cholesterol    History of use of hearing aid in both ears    does not currently wear   Hypertension    Hypothyroidism    Osteopenia    Type II diabetes mellitus (HCC)     Family History: Family History  Problem Relation Age of Onset   Colon cancer Mother    Cerebral aneurysm Father    Prostate cancer Neg Hx    Bladder Cancer Neg Hx    Kidney cancer Neg Hx     Social History   Socioeconomic History   Marital status: Married    Spouse name: Not on file   Number of children: Not on file   Years of education: Not on file   Highest education level: Not on file  Occupational History   Not on file  Tobacco Use   Smoking status: Former    Current packs/day: 0.00    Average packs/day: 3.0 packs/day for 30.0 years (90.0 ttl pk-yrs)    Types: Cigarettes    Start date: 6    Quit date: 49    Years since quitting: 38.0   Smokeless tobacco: Never   Tobacco comments:    quit smoking in 1988  Vaping Use   Vaping status: Never Used  Substance and Sexual Activity   Alcohol use: No   Drug use: No   Sexual activity: Never  Other  Topics Concern   Not on file  Social History Narrative   Not on file   Social Drivers of Health   Tobacco Use: Medium Risk (11/06/2024)   Patient History    Smoking Tobacco Use: Former    Smokeless Tobacco Use: Never    Passive Exposure: Not on Actuary Strain: Not on file  Food Insecurity: Not on file  Transportation Needs: Not on file  Physical Activity: Not on file  Stress: Not on file  Social Connections: Not on file  Intimate Partner Violence: Not on file  Depression (PHQ2-9): Low Risk (07/03/2024)   Depression (PHQ2-9)    PHQ-2 Score: 0  Alcohol Screen: Low Risk (03/24/2022)   Alcohol Screen    Last Alcohol Screening  Score (AUDIT): 1  Housing: Unknown (03/27/2024)   Received from [facility]   Epic    Unable to Pay for Housing in the Last Year: Not on file    Number of Times Moved in the Last Year: Not on file    At any time in the past 12 months, were you homeless or living in a shelter (including now)?: No  Utilities: Not on file  Health Literacy: Not on file      Review of Systems  Constitutional:  Negative for activity change, appetite change, chills, fatigue, fever and unexpected weight change.  HENT:  Positive for hearing loss (has hearing aids now). Negative for congestion, ear pain, rhinorrhea, sneezing, sore throat and trouble swallowing.   Eyes: Negative.  Negative for redness.  Respiratory: Negative.  Negative for cough, chest tightness, shortness of breath and wheezing.   Cardiovascular: Negative.  Negative for chest pain and palpitations.  Gastrointestinal: Negative.  Negative for abdominal pain, blood in stool, constipation, diarrhea, nausea and vomiting.  Endocrine: Negative.   Genitourinary: Negative.  Negative for difficulty urinating, dysuria, frequency, hematuria and urgency.  Musculoskeletal: Negative.  Negative for arthralgias, back pain, joint swelling, myalgias and neck pain.  Skin:  Negative for rash and wound.   Allergic/Immunologic: Negative.  Negative for immunocompromised state.  Neurological: Negative.  Negative for dizziness, tremors, seizures, numbness and headaches.  Hematological: Negative.  Negative for adenopathy. Does not bruise/bleed easily.  Psychiatric/Behavioral: Negative.  Negative for agitation, behavioral problems (Depression), self-injury, sleep disturbance and suicidal ideas. The patient is not nervous/anxious.     Vital Signs: BP 124/62   Pulse 68   Temp 98.2 F (36.8 C)   Resp 16   Ht 5' 11 (1.803 m)   Wt 223 lb 3.2 oz (101.2 kg)   SpO2 95%   BMI 31.13 kg/m    Physical Exam Vitals reviewed.  Constitutional:      General: He is not in acute distress.    Appearance: Normal appearance. He is well-developed. He is obese. He is not ill-appearing or diaphoretic.  HENT:     Head: Normocephalic and atraumatic.     Right Ear: Tympanic membrane, ear canal and external ear normal.     Left Ear: Tympanic membrane, ear canal and external ear normal.     Nose: Nose normal. No congestion or rhinorrhea.     Mouth/Throat:     Mouth: Mucous membranes are moist.     Pharynx: Oropharynx is clear. No oropharyngeal exudate or posterior oropharyngeal erythema.  Eyes:     General: No scleral icterus.       Right eye: No discharge.        Left eye: No discharge.     Extraocular Movements: Extraocular movements intact.     Conjunctiva/sclera: Conjunctivae normal.     Pupils: Pupils are equal, round, and reactive to light.  Neck:     Thyroid : No thyromegaly.     Vascular: No carotid bruit or JVD.     Trachea: No tracheal deviation.  Cardiovascular:     Rate and Rhythm: Normal rate and regular rhythm.     Heart sounds: Normal heart sounds. No murmur heard.    No friction rub. No gallop.  Pulmonary:     Effort: Pulmonary effort is normal. No respiratory distress.     Breath sounds: Normal breath sounds. No stridor. No wheezing or rales.  Chest:     Chest wall: No tenderness.   Abdominal:  General: Bowel sounds are normal. There is no distension.     Palpations: Abdomen is soft. There is no mass.     Tenderness: There is no abdominal tenderness. There is no guarding or rebound.  Musculoskeletal:        General: No tenderness or deformity. Normal range of motion.     Cervical back: Normal range of motion and neck supple.  Lymphadenopathy:     Cervical: No cervical adenopathy.  Skin:    General: Skin is warm and dry.     Capillary Refill: Capillary refill takes less than 2 seconds.     Coloration: Skin is not pale.  Neurological:     Mental Status: He is alert and oriented to person, place, and time.     Cranial Nerves: No cranial nerve deficit.     Motor: No abnormal muscle tone.     Coordination: Coordination normal.     Gait: Gait normal.     Deep Tendon Reflexes: Reflexes are normal and symmetric.  Psychiatric:        Mood and Affect: Mood normal.        Behavior: Behavior normal.        Thought Content: Thought content normal.        Judgment: Judgment normal.        Assessment/Plan: 1. Type 2 diabetes mellitus with other specified complication, with long-term current use of insulin  (HCC) (Primary) A1c is stable at 7.1 for his age. Urine sent to lab for ACR. [facility]  - POCT glycosylated hemoglobin (Hb A1C) - Urine Microalbumin w/creat. ratio  2. Hypertension associated with type 2 diabetes mellitus (HCC) Stable, continue medications as prescribed.   3. Stage 3a chronic kidney disease (HCC) Stable, continue medications as prescribed.   4. Hyperlipidemia associated with type 2 diabetes mellitus (HCC) Continue atorvastatin  as prescribed.  - atorvastatin  (LIPITOR) 40 MG tablet; Take 1 tablet (40 mg total) by mouth daily.  Dispense: 90 tablet; Refill: 1  5. Acquired hypothyroidism Continue thyroid  medication as prescribed by the [facility].    General Counseling: [name] understanding of the findings  of todays visit and agrees with plan of treatment. I have discussed any further diagnostic evaluation that may be needed or ordered today. We also reviewed his medications today. he has been encouraged to call the office with any questions or concerns that should arise related to todays visit.    Orders Placed This Encounter  Procedures   Urine Microalbumin w/creat. ratio   POCT glycosylated hemoglobin (Hb A1C)    Meds ordered this encounter  Medications   atorvastatin  (LIPITOR) 40 MG tablet    Sig: Take 1 tablet (40 mg total) by mouth daily.    Dispense:  90 tablet    Refill:  1    For future refills    Return in about 4 months (around 03/06/2025) for F/U, Recheck A1C, Abrar Bilton PCP.   Total time spent:30 Minutes Time spent includes review of chart, medications, test results, and follow up plan with the patient.   [place] Controlled Substance Database was reviewed by me.  This patient was seen by Mardy Maxin, FNP-C in collaboration with Dr. Sigrid Bathe as a part of collaborative care agreement.   Audria Takeshita R. Maxin, MSN, FNP-C Internal medicine

## 2024-11-07 LAB — MICROALBUMIN / CREATININE URINE RATIO
Creatinine, Urine: 153.1 mg/dL
Microalb/Creat Ratio: 22 mg/g{creat} (ref 0–29)
Microalbumin, Urine: 33.1 ug/mL

## 2025-03-06 ENCOUNTER — Ambulatory Visit: Admitting: Nurse Practitioner

## 2025-07-04 ENCOUNTER — Ambulatory Visit: Admitting: Nurse Practitioner
